# Patient Record
Sex: Female | Born: 1960 | Race: Black or African American | Hispanic: No | Marital: Married | Smoking: Never smoker
Health system: Southern US, Community
[De-identification: ages and names within clinical notes are randomized; demographics above are authoritative.]

## PROBLEM LIST (undated history)

## (undated) DIAGNOSIS — I10 Essential (primary) hypertension: Secondary | ICD-10-CM

## (undated) DIAGNOSIS — D696 Thrombocytopenia, unspecified: Secondary | ICD-10-CM

## (undated) DIAGNOSIS — M7730 Calcaneal spur, unspecified foot: Secondary | ICD-10-CM

## (undated) DIAGNOSIS — E079 Disorder of thyroid, unspecified: Secondary | ICD-10-CM

## (undated) DIAGNOSIS — E785 Hyperlipidemia, unspecified: Secondary | ICD-10-CM

## (undated) DIAGNOSIS — M1990 Unspecified osteoarthritis, unspecified site: Secondary | ICD-10-CM

## (undated) DIAGNOSIS — C50919 Malignant neoplasm of unspecified site of unspecified female breast: Secondary | ICD-10-CM

## (undated) HISTORY — DX: Essential (primary) hypertension: I10

## (undated) HISTORY — PX: COLONOSCOPY: SHX174

## (undated) HISTORY — DX: Calcaneal spur, unspecified foot: M77.30

## (undated) HISTORY — DX: Disorder of thyroid, unspecified: E07.9

## (undated) HISTORY — DX: Malignant neoplasm of unspecified site of unspecified female breast: C50.919

## (undated) HISTORY — DX: Thrombocytopenia, unspecified: D69.6

## (undated) HISTORY — DX: Hyperlipidemia, unspecified: E78.5

## (undated) HISTORY — PX: ABDOMINAL HYSTERECTOMY: SHX81

## (undated) HISTORY — DX: Unspecified osteoarthritis, unspecified site: M19.90

---

## 1998-07-06 ENCOUNTER — Encounter (INDEPENDENT_AMBULATORY_CARE_PROVIDER_SITE_OTHER): Payer: Self-pay | Admitting: *Deleted

## 1998-08-01 ENCOUNTER — Encounter: Admission: RE | Admit: 1998-08-01 | Discharge: 1998-08-01 | Payer: Self-pay | Admitting: Family Medicine

## 1998-09-25 ENCOUNTER — Encounter: Admission: RE | Admit: 1998-09-25 | Discharge: 1998-09-25 | Payer: Self-pay | Admitting: Family Medicine

## 1999-01-17 ENCOUNTER — Encounter: Admission: RE | Admit: 1999-01-17 | Discharge: 1999-01-17 | Payer: Self-pay | Admitting: Family Medicine

## 1999-02-14 ENCOUNTER — Encounter: Admission: RE | Admit: 1999-02-14 | Discharge: 1999-02-14 | Payer: Self-pay | Admitting: Family Medicine

## 1999-11-23 ENCOUNTER — Encounter: Admission: RE | Admit: 1999-11-23 | Discharge: 1999-11-23 | Payer: Self-pay | Admitting: Family Medicine

## 2000-10-19 ENCOUNTER — Encounter: Payer: Self-pay | Admitting: Emergency Medicine

## 2000-10-19 ENCOUNTER — Emergency Department (HOSPITAL_COMMUNITY): Admission: EM | Admit: 2000-10-19 | Discharge: 2000-10-19 | Payer: Self-pay | Admitting: Emergency Medicine

## 2001-02-11 ENCOUNTER — Encounter: Payer: Self-pay | Admitting: Emergency Medicine

## 2001-02-11 ENCOUNTER — Emergency Department (HOSPITAL_COMMUNITY): Admission: EM | Admit: 2001-02-11 | Discharge: 2001-02-11 | Payer: Self-pay | Admitting: Emergency Medicine

## 2001-02-18 ENCOUNTER — Encounter: Payer: Self-pay | Admitting: Internal Medicine

## 2001-02-18 ENCOUNTER — Encounter: Admission: RE | Admit: 2001-02-18 | Discharge: 2001-02-18 | Payer: Self-pay | Admitting: Internal Medicine

## 2001-02-23 ENCOUNTER — Encounter: Payer: Self-pay | Admitting: Internal Medicine

## 2001-02-23 ENCOUNTER — Encounter: Admission: RE | Admit: 2001-02-23 | Discharge: 2001-02-23 | Payer: Self-pay | Admitting: Internal Medicine

## 2001-05-04 ENCOUNTER — Encounter: Payer: Self-pay | Admitting: Surgery

## 2001-05-04 ENCOUNTER — Ambulatory Visit (HOSPITAL_COMMUNITY): Admission: RE | Admit: 2001-05-04 | Discharge: 2001-05-04 | Payer: Self-pay | Admitting: Surgery

## 2001-06-30 ENCOUNTER — Encounter: Payer: Self-pay | Admitting: Surgery

## 2001-07-02 ENCOUNTER — Encounter (INDEPENDENT_AMBULATORY_CARE_PROVIDER_SITE_OTHER): Payer: Self-pay | Admitting: *Deleted

## 2001-07-02 ENCOUNTER — Ambulatory Visit (HOSPITAL_COMMUNITY): Admission: RE | Admit: 2001-07-02 | Discharge: 2001-07-03 | Payer: Self-pay | Admitting: Surgery

## 2001-07-22 ENCOUNTER — Emergency Department (HOSPITAL_COMMUNITY): Admission: EM | Admit: 2001-07-22 | Discharge: 2001-07-22 | Payer: Self-pay | Admitting: Emergency Medicine

## 2002-03-18 ENCOUNTER — Encounter: Admission: RE | Admit: 2002-03-18 | Discharge: 2002-03-18 | Payer: Self-pay | Admitting: Internal Medicine

## 2002-03-18 ENCOUNTER — Encounter: Payer: Self-pay | Admitting: Internal Medicine

## 2002-04-07 ENCOUNTER — Emergency Department (HOSPITAL_COMMUNITY): Admission: EM | Admit: 2002-04-07 | Discharge: 2002-04-07 | Payer: Self-pay | Admitting: Emergency Medicine

## 2002-04-07 ENCOUNTER — Encounter: Payer: Self-pay | Admitting: Emergency Medicine

## 2002-12-01 ENCOUNTER — Emergency Department (HOSPITAL_COMMUNITY): Admission: EM | Admit: 2002-12-01 | Discharge: 2002-12-01 | Payer: Self-pay | Admitting: Emergency Medicine

## 2003-03-31 ENCOUNTER — Encounter: Admission: RE | Admit: 2003-03-31 | Discharge: 2003-03-31 | Payer: Self-pay | Admitting: Internal Medicine

## 2003-07-27 ENCOUNTER — Emergency Department (HOSPITAL_COMMUNITY): Admission: EM | Admit: 2003-07-27 | Discharge: 2003-07-27 | Payer: Self-pay | Admitting: Emergency Medicine

## 2003-10-18 ENCOUNTER — Emergency Department (HOSPITAL_COMMUNITY): Admission: EM | Admit: 2003-10-18 | Discharge: 2003-10-18 | Payer: Self-pay | Admitting: Emergency Medicine

## 2004-01-21 ENCOUNTER — Emergency Department (HOSPITAL_COMMUNITY): Admission: EM | Admit: 2004-01-21 | Discharge: 2004-01-21 | Payer: Self-pay | Admitting: Family Medicine

## 2004-01-31 ENCOUNTER — Emergency Department (HOSPITAL_COMMUNITY): Admission: EM | Admit: 2004-01-31 | Discharge: 2004-01-31 | Payer: Self-pay | Admitting: Emergency Medicine

## 2004-09-14 ENCOUNTER — Emergency Department (HOSPITAL_COMMUNITY): Admission: EM | Admit: 2004-09-14 | Discharge: 2004-09-14 | Payer: Self-pay | Admitting: Family Medicine

## 2004-09-18 ENCOUNTER — Emergency Department (HOSPITAL_COMMUNITY): Admission: EM | Admit: 2004-09-18 | Discharge: 2004-09-18 | Payer: Self-pay | Admitting: Family Medicine

## 2005-10-05 ENCOUNTER — Emergency Department (HOSPITAL_COMMUNITY): Admission: EM | Admit: 2005-10-05 | Discharge: 2005-10-05 | Payer: Self-pay | Admitting: Emergency Medicine

## 2006-04-04 ENCOUNTER — Encounter (INDEPENDENT_AMBULATORY_CARE_PROVIDER_SITE_OTHER): Payer: Self-pay | Admitting: *Deleted

## 2006-04-07 ENCOUNTER — Encounter: Admission: RE | Admit: 2006-04-07 | Discharge: 2006-04-07 | Payer: Self-pay | Admitting: Family Medicine

## 2006-05-25 ENCOUNTER — Emergency Department (HOSPITAL_COMMUNITY): Admission: EM | Admit: 2006-05-25 | Discharge: 2006-05-25 | Payer: Self-pay | Admitting: Family Medicine

## 2006-06-29 ENCOUNTER — Emergency Department (HOSPITAL_COMMUNITY): Admission: EM | Admit: 2006-06-29 | Discharge: 2006-06-29 | Payer: Self-pay | Admitting: Family Medicine

## 2006-12-06 ENCOUNTER — Emergency Department (HOSPITAL_COMMUNITY): Admission: EM | Admit: 2006-12-06 | Discharge: 2006-12-06 | Payer: Self-pay | Admitting: Emergency Medicine

## 2007-02-09 ENCOUNTER — Emergency Department (HOSPITAL_COMMUNITY): Admission: EM | Admit: 2007-02-09 | Discharge: 2007-02-09 | Payer: Self-pay | Admitting: Family Medicine

## 2007-05-26 ENCOUNTER — Ambulatory Visit (HOSPITAL_COMMUNITY): Admission: RE | Admit: 2007-05-26 | Discharge: 2007-05-26 | Payer: Self-pay | Admitting: Family Medicine

## 2007-06-04 ENCOUNTER — Encounter: Admission: RE | Admit: 2007-06-04 | Discharge: 2007-06-04 | Payer: Self-pay | Admitting: Family Medicine

## 2007-07-06 ENCOUNTER — Encounter: Admission: RE | Admit: 2007-07-06 | Discharge: 2007-07-06 | Payer: Self-pay | Admitting: Family Medicine

## 2007-09-17 ENCOUNTER — Ambulatory Visit: Payer: Self-pay | Admitting: Oncology

## 2007-09-29 LAB — CBC WITH DIFFERENTIAL/PLATELET
Eosinophils Absolute: 0.1 10*3/uL (ref 0.0–0.5)
HCT: 40.3 % (ref 34.8–46.6)
HGB: 13.3 g/dL (ref 11.6–15.9)
LYMPH%: 30.9 % (ref 14.0–48.0)
MONO#: 0.6 10*3/uL (ref 0.1–0.9)
NEUT#: 5.1 10*3/uL (ref 1.5–6.5)
NEUT%: 60.2 % (ref 39.6–76.8)
Platelets: 120 10*3/uL — ABNORMAL LOW (ref 145–400)
WBC: 8.5 10*3/uL (ref 3.9–10.0)

## 2007-09-29 LAB — COMPREHENSIVE METABOLIC PANEL
ALT: 18 U/L (ref 0–35)
AST: 16 U/L (ref 0–37)
CO2: 24 mEq/L (ref 19–32)
Chloride: 105 mEq/L (ref 96–112)
Sodium: 140 mEq/L (ref 135–145)
Total Bilirubin: 0.4 mg/dL (ref 0.3–1.2)
Total Protein: 7.3 g/dL (ref 6.0–8.3)

## 2007-09-29 LAB — LACTATE DEHYDROGENASE: LDH: 130 U/L (ref 94–250)

## 2007-09-29 LAB — CHCC SMEAR

## 2008-04-11 ENCOUNTER — Encounter: Admission: RE | Admit: 2008-04-11 | Discharge: 2008-04-11 | Payer: Self-pay | Admitting: Family Medicine

## 2008-04-27 ENCOUNTER — Ambulatory Visit: Payer: Self-pay | Admitting: Oncology

## 2008-04-29 LAB — CBC WITH DIFFERENTIAL/PLATELET
Basophils Absolute: 0.1 10*3/uL (ref 0.0–0.1)
HCT: 42 % (ref 34.8–46.6)
HGB: 13.5 g/dL (ref 11.6–15.9)
MCH: 24.5 pg — ABNORMAL LOW (ref 25.1–34.0)
MONO#: 0.6 10*3/uL (ref 0.1–0.9)
NEUT%: 46.2 % (ref 38.4–76.8)
WBC: 8.5 10*3/uL (ref 3.9–10.3)
lymph#: 3.7 10*3/uL — ABNORMAL HIGH (ref 0.9–3.3)

## 2008-05-26 ENCOUNTER — Encounter: Admission: RE | Admit: 2008-05-26 | Discharge: 2008-05-26 | Payer: Self-pay | Admitting: Family Medicine

## 2008-09-01 DIAGNOSIS — E559 Vitamin D deficiency, unspecified: Secondary | ICD-10-CM | POA: Insufficient documentation

## 2008-09-01 DIAGNOSIS — E785 Hyperlipidemia, unspecified: Secondary | ICD-10-CM | POA: Insufficient documentation

## 2008-09-26 ENCOUNTER — Ambulatory Visit: Payer: Self-pay | Admitting: Oncology

## 2009-01-08 ENCOUNTER — Emergency Department (HOSPITAL_COMMUNITY): Admission: EM | Admit: 2009-01-08 | Discharge: 2009-01-08 | Payer: Self-pay | Admitting: Emergency Medicine

## 2009-01-31 DIAGNOSIS — R5381 Other malaise: Secondary | ICD-10-CM | POA: Insufficient documentation

## 2009-01-31 DIAGNOSIS — R7301 Impaired fasting glucose: Secondary | ICD-10-CM | POA: Insufficient documentation

## 2009-02-03 DIAGNOSIS — I10 Essential (primary) hypertension: Secondary | ICD-10-CM | POA: Insufficient documentation

## 2009-06-02 ENCOUNTER — Encounter: Admission: RE | Admit: 2009-06-02 | Discharge: 2009-06-02 | Payer: Self-pay | Admitting: Family Medicine

## 2009-06-04 HISTORY — PX: BREAST BIOPSY: SHX20

## 2009-06-07 ENCOUNTER — Encounter: Admission: RE | Admit: 2009-06-07 | Discharge: 2009-06-07 | Payer: Self-pay | Admitting: Family Medicine

## 2009-06-15 ENCOUNTER — Encounter: Admission: RE | Admit: 2009-06-15 | Discharge: 2009-06-15 | Payer: Self-pay | Admitting: Family Medicine

## 2009-06-22 ENCOUNTER — Ambulatory Visit: Payer: Self-pay | Admitting: Oncology

## 2009-06-22 ENCOUNTER — Ambulatory Visit: Payer: Self-pay | Admitting: Genetic Counselor

## 2009-06-22 ENCOUNTER — Ambulatory Visit: Payer: Self-pay | Admitting: Family Medicine

## 2009-06-22 DIAGNOSIS — D693 Immune thrombocytopenic purpura: Secondary | ICD-10-CM | POA: Insufficient documentation

## 2009-06-22 DIAGNOSIS — I10 Essential (primary) hypertension: Secondary | ICD-10-CM | POA: Insufficient documentation

## 2009-06-22 DIAGNOSIS — Z862 Personal history of diseases of the blood and blood-forming organs and certain disorders involving the immune mechanism: Secondary | ICD-10-CM | POA: Insufficient documentation

## 2009-06-22 DIAGNOSIS — Z8639 Personal history of other endocrine, nutritional and metabolic disease: Secondary | ICD-10-CM

## 2009-06-26 ENCOUNTER — Encounter: Payer: Self-pay | Admitting: Family Medicine

## 2009-06-26 LAB — CBC WITH DIFFERENTIAL/PLATELET
Basophils Absolute: 0 10*3/uL (ref 0.0–0.1)
Eosinophils Absolute: 0 10*3/uL (ref 0.0–0.5)
HCT: 42.1 % (ref 34.8–46.6)
LYMPH%: 38.5 % (ref 14.0–49.7)
MCV: 77.1 fL — ABNORMAL LOW (ref 79.5–101.0)
MONO#: 0.7 10*3/uL (ref 0.1–0.9)
MONO%: 7.1 % (ref 0.0–14.0)
NEUT#: 5 10*3/uL (ref 1.5–6.5)
NEUT%: 53.7 % (ref 38.4–76.8)
Platelets: 119 10*3/uL — ABNORMAL LOW (ref 145–400)
WBC: 9.4 10*3/uL (ref 3.9–10.3)

## 2009-06-26 LAB — COMPREHENSIVE METABOLIC PANEL
ALT: 24 U/L (ref 0–35)
BUN: 10 mg/dL (ref 6–23)
CO2: 28 mEq/L (ref 19–32)
Calcium: 10.1 mg/dL (ref 8.4–10.5)
Chloride: 103 mEq/L (ref 96–112)
Creatinine, Ser: 0.92 mg/dL (ref 0.40–1.20)
Glucose, Bld: 98 mg/dL (ref 70–99)

## 2009-06-26 LAB — CANCER ANTIGEN 27.29: CA 27.29: 25 U/mL (ref 0–39)

## 2009-06-30 ENCOUNTER — Ambulatory Visit (HOSPITAL_COMMUNITY): Admission: RE | Admit: 2009-06-30 | Discharge: 2009-06-30 | Payer: Self-pay | Admitting: Oncology

## 2009-07-17 LAB — CBC WITH DIFFERENTIAL/PLATELET
Basophils Absolute: 0 10*3/uL (ref 0.0–0.1)
Eosinophils Absolute: 0.1 10*3/uL (ref 0.0–0.5)
HCT: 40 % (ref 34.8–46.6)
HGB: 12.9 g/dL (ref 11.6–15.9)
MCH: 25 pg — ABNORMAL LOW (ref 25.1–34.0)
MCV: 77.4 fL — ABNORMAL LOW (ref 79.5–101.0)
NEUT#: 4.3 10*3/uL (ref 1.5–6.5)
NEUT%: 55.2 % (ref 38.4–76.8)
RDW: 15 % — ABNORMAL HIGH (ref 11.2–14.5)
lymph#: 2.9 10*3/uL (ref 0.9–3.3)

## 2009-07-21 ENCOUNTER — Ambulatory Visit (HOSPITAL_COMMUNITY): Admission: RE | Admit: 2009-07-21 | Discharge: 2009-07-23 | Payer: Self-pay | Admitting: General Surgery

## 2009-07-21 ENCOUNTER — Encounter (INDEPENDENT_AMBULATORY_CARE_PROVIDER_SITE_OTHER): Payer: Self-pay | Admitting: General Surgery

## 2009-07-21 HISTORY — PX: MASTECTOMY: SHX3

## 2009-08-24 ENCOUNTER — Ambulatory Visit: Payer: Self-pay | Admitting: Oncology

## 2009-08-28 ENCOUNTER — Encounter: Payer: Self-pay | Admitting: Family Medicine

## 2009-08-28 LAB — BASIC METABOLIC PANEL
CO2: 24 mEq/L (ref 19–32)
Calcium: 9.6 mg/dL (ref 8.4–10.5)
Chloride: 103 mEq/L (ref 96–112)
Creatinine, Ser: 0.83 mg/dL (ref 0.40–1.20)
Sodium: 139 mEq/L (ref 135–145)

## 2009-08-28 LAB — CBC WITH DIFFERENTIAL/PLATELET
BASO%: 0.4 % (ref 0.0–2.0)
Eosinophils Absolute: 0.2 10*3/uL (ref 0.0–0.5)
LYMPH%: 37.1 % (ref 14.0–49.7)
MCHC: 32.2 g/dL (ref 31.5–36.0)
MONO#: 0.6 10*3/uL (ref 0.1–0.9)
NEUT#: 4.6 10*3/uL (ref 1.5–6.5)
Platelets: 109 10*3/uL — ABNORMAL LOW (ref 145–400)
RBC: 5.2 10*6/uL (ref 3.70–5.45)
RDW: 14.6 % — ABNORMAL HIGH (ref 11.2–14.5)
WBC: 8.7 10*3/uL (ref 3.9–10.3)

## 2009-08-28 LAB — TECHNOLOGIST REVIEW

## 2009-09-08 ENCOUNTER — Encounter: Payer: Self-pay | Admitting: Family Medicine

## 2009-09-18 ENCOUNTER — Encounter: Payer: Self-pay | Admitting: Family Medicine

## 2009-09-18 LAB — COMPREHENSIVE METABOLIC PANEL
Albumin: 4.2 g/dL (ref 3.5–5.2)
Alkaline Phosphatase: 78 U/L (ref 39–117)
BUN: 11 mg/dL (ref 6–23)
CO2: 25 mEq/L (ref 19–32)
Calcium: 9.8 mg/dL (ref 8.4–10.5)
Glucose, Bld: 106 mg/dL — ABNORMAL HIGH (ref 70–99)
Potassium: 3.9 mEq/L (ref 3.5–5.3)
Sodium: 139 mEq/L (ref 135–145)
Total Protein: 7.6 g/dL (ref 6.0–8.3)

## 2009-09-18 LAB — CBC WITH DIFFERENTIAL/PLATELET
Basophils Absolute: 0 10*3/uL (ref 0.0–0.1)
EOS%: 1.7 % (ref 0.0–7.0)
Eosinophils Absolute: 0.1 10*3/uL (ref 0.0–0.5)
HCT: 41.4 % (ref 34.8–46.6)
HGB: 13.2 g/dL (ref 11.6–15.9)
MCH: 24.6 pg — ABNORMAL LOW (ref 25.1–34.0)
MCV: 77.1 fL — ABNORMAL LOW (ref 79.5–101.0)
NEUT#: 4.3 10*3/uL (ref 1.5–6.5)
NEUT%: 51.7 % (ref 38.4–76.8)
RDW: 14.8 % — ABNORMAL HIGH (ref 11.2–14.5)
lymph#: 3.4 10*3/uL — ABNORMAL HIGH (ref 0.9–3.3)

## 2009-09-21 ENCOUNTER — Ambulatory Visit (HOSPITAL_COMMUNITY): Admission: RE | Admit: 2009-09-21 | Discharge: 2009-09-21 | Payer: Self-pay | Admitting: Oncology

## 2009-10-03 ENCOUNTER — Encounter: Payer: Self-pay | Admitting: *Deleted

## 2009-10-04 ENCOUNTER — Ambulatory Visit: Payer: Self-pay | Admitting: Family Medicine

## 2009-10-04 ENCOUNTER — Ambulatory Visit (HOSPITAL_COMMUNITY): Admission: RE | Admit: 2009-10-04 | Discharge: 2009-10-04 | Payer: Self-pay | Admitting: General Surgery

## 2009-10-04 DIAGNOSIS — C50111 Malignant neoplasm of central portion of right female breast: Secondary | ICD-10-CM | POA: Insufficient documentation

## 2009-10-06 ENCOUNTER — Ambulatory Visit: Payer: Self-pay | Admitting: Dentistry

## 2009-10-06 ENCOUNTER — Encounter: Admission: AD | Admit: 2009-10-06 | Discharge: 2009-10-06 | Payer: Self-pay | Admitting: Dentistry

## 2009-10-19 ENCOUNTER — Ambulatory Visit: Payer: Self-pay | Admitting: Oncology

## 2009-10-23 ENCOUNTER — Encounter: Payer: Self-pay | Admitting: Family Medicine

## 2009-10-30 ENCOUNTER — Encounter: Payer: Self-pay | Admitting: Family Medicine

## 2009-10-30 LAB — BASIC METABOLIC PANEL
CO2: 28 mEq/L (ref 19–32)
Calcium: 10.2 mg/dL (ref 8.4–10.5)
Chloride: 95 mEq/L — ABNORMAL LOW (ref 96–112)
Sodium: 137 mEq/L (ref 135–145)

## 2009-10-30 LAB — CBC WITH DIFFERENTIAL/PLATELET
BASO%: 0.5 % (ref 0.0–2.0)
Eosinophils Absolute: 0 10*3/uL (ref 0.0–0.5)
MCHC: 32.2 g/dL (ref 31.5–36.0)
MONO#: 0.9 10*3/uL (ref 0.1–0.9)
NEUT#: 4.1 10*3/uL (ref 1.5–6.5)
RBC: 5.59 10*6/uL — ABNORMAL HIGH (ref 3.70–5.45)
RDW: 14.8 % — ABNORMAL HIGH (ref 11.2–14.5)
WBC: 8.1 10*3/uL (ref 3.9–10.3)
lymph#: 3.1 10*3/uL (ref 0.9–3.3)
nRBC: 1 % — ABNORMAL HIGH (ref 0–0)

## 2009-11-13 LAB — CBC WITH DIFFERENTIAL/PLATELET
Basophils Absolute: 0 10*3/uL (ref 0.0–0.1)
Eosinophils Absolute: 0 10*3/uL (ref 0.0–0.5)
HCT: 37.8 % (ref 34.8–46.6)
HGB: 12.2 g/dL (ref 11.6–15.9)
MCV: 75 fL — ABNORMAL LOW (ref 79.5–101.0)
NEUT#: 13.9 10*3/uL — ABNORMAL HIGH (ref 1.5–6.5)
NEUT%: 85.8 % — ABNORMAL HIGH (ref 38.4–76.8)
RDW: 16.4 % — ABNORMAL HIGH (ref 11.2–14.5)
lymph#: 1.7 10*3/uL (ref 0.9–3.3)

## 2009-11-13 LAB — COMPREHENSIVE METABOLIC PANEL
Albumin: 4.2 g/dL (ref 3.5–5.2)
Alkaline Phosphatase: 56 U/L (ref 39–117)
BUN: 17 mg/dL (ref 6–23)
Calcium: 9.9 mg/dL (ref 8.4–10.5)
Creatinine, Ser: 0.77 mg/dL (ref 0.40–1.20)
Glucose, Bld: 163 mg/dL — ABNORMAL HIGH (ref 70–99)
Potassium: 3.6 mEq/L (ref 3.5–5.3)

## 2009-11-30 ENCOUNTER — Ambulatory Visit: Payer: Self-pay | Admitting: Oncology

## 2009-12-04 LAB — COMPREHENSIVE METABOLIC PANEL
ALT: 23 U/L (ref 0–35)
CO2: 23 mEq/L (ref 19–32)
Chloride: 103 mEq/L (ref 96–112)
Sodium: 138 mEq/L (ref 135–145)
Total Bilirubin: 0.3 mg/dL (ref 0.3–1.2)
Total Protein: 6.4 g/dL (ref 6.0–8.3)

## 2009-12-04 LAB — CBC WITH DIFFERENTIAL/PLATELET
Basophils Absolute: 0 10*3/uL (ref 0.0–0.1)
Eosinophils Absolute: 0 10*3/uL (ref 0.0–0.5)
HCT: 34.8 % (ref 34.8–46.6)
HGB: 11.1 g/dL — ABNORMAL LOW (ref 11.6–15.9)
LYMPH%: 9.6 % — ABNORMAL LOW (ref 14.0–49.7)
MONO#: 0.5 10*3/uL (ref 0.1–0.9)
NEUT#: 12.3 10*3/uL — ABNORMAL HIGH (ref 1.5–6.5)
NEUT%: 86.7 % — ABNORMAL HIGH (ref 38.4–76.8)
Platelets: 157 10*3/uL (ref 145–400)
WBC: 14.2 10*3/uL — ABNORMAL HIGH (ref 3.9–10.3)
nRBC: 0 % (ref 0–0)

## 2009-12-11 LAB — CBC WITH DIFFERENTIAL/PLATELET
BASO%: 1.2 % (ref 0.0–2.0)
Basophils Absolute: 0.1 10*3/uL (ref 0.0–0.1)
EOS%: 0.3 % (ref 0.0–7.0)
HGB: 11.5 g/dL — ABNORMAL LOW (ref 11.6–15.9)
MCH: 24.5 pg — ABNORMAL LOW (ref 25.1–34.0)
MONO#: 2.1 10*3/uL — ABNORMAL HIGH (ref 0.1–0.9)
RDW: 18.7 % — ABNORMAL HIGH (ref 11.2–14.5)
WBC: 12.2 10*3/uL — ABNORMAL HIGH (ref 3.9–10.3)
lymph#: 3 10*3/uL (ref 0.9–3.3)

## 2009-12-11 LAB — BASIC METABOLIC PANEL
Glucose, Bld: 118 mg/dL — ABNORMAL HIGH (ref 70–99)
Potassium: 3.3 mEq/L — ABNORMAL LOW (ref 3.5–5.3)
Sodium: 139 mEq/L (ref 135–145)

## 2009-12-25 LAB — CBC WITH DIFFERENTIAL/PLATELET
Eosinophils Absolute: 0 10*3/uL (ref 0.0–0.5)
MONO#: 0.9 10*3/uL (ref 0.1–0.9)
NEUT#: 11.7 10*3/uL — ABNORMAL HIGH (ref 1.5–6.5)
RBC: 4.31 10*6/uL (ref 3.70–5.45)
RDW: 20.2 % — ABNORMAL HIGH (ref 11.2–14.5)
WBC: 13.9 10*3/uL — ABNORMAL HIGH (ref 3.9–10.3)
nRBC: 0 % (ref 0–0)

## 2009-12-26 LAB — COMPREHENSIVE METABOLIC PANEL
Albumin: 4 g/dL (ref 3.5–5.2)
CO2: 25 mEq/L (ref 19–32)
Glucose, Bld: 150 mg/dL — ABNORMAL HIGH (ref 70–99)
Potassium: 3.9 mEq/L (ref 3.5–5.3)
Sodium: 139 mEq/L (ref 135–145)
Total Protein: 6.3 g/dL (ref 6.0–8.3)

## 2010-01-01 ENCOUNTER — Ambulatory Visit: Payer: Self-pay | Admitting: Oncology

## 2010-01-02 ENCOUNTER — Ambulatory Visit: Payer: Self-pay | Admitting: Family Medicine

## 2010-01-02 DIAGNOSIS — R358 Other polyuria: Secondary | ICD-10-CM

## 2010-01-02 DIAGNOSIS — R3589 Other polyuria: Secondary | ICD-10-CM | POA: Insufficient documentation

## 2010-01-03 LAB — CBC WITH DIFFERENTIAL/PLATELET
BASO%: 0.9 % (ref 0.0–2.0)
LYMPH%: 11.1 % — ABNORMAL LOW (ref 14.0–49.7)
MCHC: 33.3 g/dL (ref 31.5–36.0)
MONO#: 1.8 10*3/uL — ABNORMAL HIGH (ref 0.1–0.9)
Platelets: 153 10*3/uL (ref 145–400)
RBC: 4.29 10*6/uL (ref 3.70–5.45)
WBC: 20.3 10*3/uL — ABNORMAL HIGH (ref 3.9–10.3)
lymph#: 2.3 10*3/uL (ref 0.9–3.3)

## 2010-01-03 LAB — BASIC METABOLIC PANEL
CO2: 29 mEq/L (ref 19–32)
Calcium: 9.1 mg/dL (ref 8.4–10.5)
Sodium: 141 mEq/L (ref 135–145)

## 2010-01-15 ENCOUNTER — Telehealth: Payer: Self-pay | Admitting: Family Medicine

## 2010-01-31 ENCOUNTER — Ambulatory Visit
Admission: RE | Admit: 2010-01-31 | Discharge: 2010-01-31 | Payer: Self-pay | Source: Home / Self Care | Attending: Family Medicine | Admitting: Family Medicine

## 2010-01-31 ENCOUNTER — Encounter: Payer: Self-pay | Admitting: Family Medicine

## 2010-01-31 LAB — CONVERTED CEMR LAB
Alkaline Phosphatase: 49 units/L (ref 39–117)
Cholesterol: 246 mg/dL — ABNORMAL HIGH (ref 0–200)
Creatinine, Ser: 0.67 mg/dL (ref 0.40–1.20)
Glucose, Bld: 102 mg/dL — ABNORMAL HIGH (ref 70–99)
HCT: 37.4 % (ref 36.0–46.0)
Hemoglobin: 11.7 g/dL — ABNORMAL LOW (ref 12.0–15.0)
LDL Cholesterol: 152 mg/dL — ABNORMAL HIGH (ref 0–99)
MCHC: 31.3 g/dL (ref 30.0–36.0)
MCV: 82.6 fL (ref 78.0–100.0)
RBC: 4.53 M/uL (ref 3.87–5.11)
Sodium: 143 meq/L (ref 135–145)
TSH: 0.856 microintl units/mL (ref 0.350–4.500)
Total Bilirubin: 0.5 mg/dL (ref 0.3–1.2)
Total Protein: 6.1 g/dL (ref 6.0–8.3)
VLDL: 45 mg/dL — ABNORMAL HIGH (ref 0–40)

## 2010-02-04 HISTORY — PX: PARATHYROIDECTOMY: SHX19

## 2010-02-06 ENCOUNTER — Encounter: Payer: Self-pay | Admitting: Family Medicine

## 2010-02-07 ENCOUNTER — Encounter: Payer: Self-pay | Admitting: Family Medicine

## 2010-02-14 ENCOUNTER — Ambulatory Visit: Admit: 2010-02-14 | Payer: Self-pay

## 2010-02-22 ENCOUNTER — Ambulatory Visit: Payer: Self-pay | Admitting: Oncology

## 2010-02-25 ENCOUNTER — Encounter: Payer: Self-pay | Admitting: Family Medicine

## 2010-02-26 ENCOUNTER — Encounter: Payer: Self-pay | Admitting: Family Medicine

## 2010-02-26 LAB — COMPREHENSIVE METABOLIC PANEL
ALT: 12 U/L (ref 0–35)
AST: 15 U/L (ref 0–37)
Albumin: 4 g/dL (ref 3.5–5.2)
Alkaline Phosphatase: 57 U/L (ref 39–117)
BUN: 9 mg/dL (ref 6–23)
Calcium: 10 mg/dL (ref 8.4–10.5)
Chloride: 100 mEq/L (ref 96–112)
Potassium: 3.4 mEq/L — ABNORMAL LOW (ref 3.5–5.3)
Sodium: 143 mEq/L (ref 135–145)

## 2010-02-26 LAB — CBC WITH DIFFERENTIAL/PLATELET
BASO%: 0.5 % (ref 0.0–2.0)
EOS%: 1.1 % (ref 0.0–7.0)
MCH: 24.7 pg — ABNORMAL LOW (ref 25.1–34.0)
MCHC: 31.7 g/dL (ref 31.5–36.0)
MONO#: 0.4 10*3/uL (ref 0.1–0.9)
NEUT%: 54.7 % (ref 38.4–76.8)
RBC: 5.03 10*6/uL (ref 3.70–5.45)
RDW: 14.6 % — ABNORMAL HIGH (ref 11.2–14.5)
WBC: 5.7 10*3/uL (ref 3.9–10.3)
lymph#: 2.1 10*3/uL (ref 0.9–3.3)
nRBC: 0 % (ref 0–0)

## 2010-03-06 NOTE — Assessment & Plan Note (Signed)
Summary: BP CHECK/EO  Nurse Visit  patient in office , originally had appointment scheduled with Dr. Hulen Luster for CPE. however she was notified by her surgeon's office  that they need to see  her today to check out her port-a- cath for her upcoming chemotherapy on 10/23/2009. appointment with surgeon is 2:00 today and her appointment with Dr. Hulen Luster was at 1:45 . She does not time today to see Dr. Hulen Luster and so her appointment here was cancelled and she wanted to have BP checked only.  BP checked manually LA 170/100, RA 160/100  pulse 100.  after BP was checked patient stated she had had a right mastectomy in June 2011.  she did take her medication as directed this AM. consulted Dr. Jennette Kettle and she advises to reschedule appointment 'with Dr. Hulen Luster and follow up on BP then. Dr. Jeri Lager first available appointment is 10/24/2009. patient states she is to recieve  chemo on 10/23/2009 and is suppose to have BP under control by that time.  scheduled appointment for work in tomorrow with Dr. Lelon Perla for BP management and appointment scheduled with Dr. Hulen Luster on 10/30/2009 for CPE. Theresia Lo RN  October 03, 2009 2:02 PM    Allergies: No Known Drug Allergies  Orders Added: 1)  No Charge Patient Arrived (NCPA0) [[id number]]

## 2010-03-06 NOTE — Consult Note (Signed)
[facility]   Imported By: Clydell Hakim 11/01/2009 10:31:52  _____________________________________________________________________  External Attachment:    Type:   Image     Comment:   External Document

## 2010-03-06 NOTE — Assessment & Plan Note (Signed)
Summary: BP follow up/ls   Vital Signs:  Patient profile:   50 year old female Height:      65.5 inches Weight:      216 pounds BMI:     35.53 BSA:     2.06 Temp:     98.6 degrees F Pulse rate:   86 / minute BP sitting:   163 / 97  Vitals Entered By: Jone Baseman CMA (October 04, 2009 3:21 PM) CC: F/U BP Is Patient Diabetic? No Pain Assessment Patient in pain? no        CC:  F/U BP.  History of Present Illness: 1. HTN - Pt has had this for months but is really trying to get it under control now because she is about to undergo chemotherapy and her Heme/Onc doctor wants her blood pressure better controlled - She is taking the HCTZ as directed - She checks her blood pressure at home occassionally and it is usually around 160/100.  ROS: endorses some dizziness.  Denies chest pain, shortness of breath.  PMHx: breast cancer s/p right mastectomy  Habits & Providers  Alcohol-Tobacco-Diet     Tobacco Status: never  Current Medications (verified): 1)  Hydrochlorothiazide 25 Mg Tabs (Hydrochlorothiazide) .Marland Kitchen.. 1 Tab By Mouth Daily 2)  Carvedilol 6.25 Mg Tabs (Carvedilol) .Marland Kitchen.. 1 Tab By Mouth Twice A Day  Allergies: No Known Drug Allergies  Past History:  Past Medical History: Reviewed history from 09/08/2009 and no changes required. 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 ; No smoking, alcohol, drugs  Review of Systems  The patient denies fever, weight loss, dyspnea on exertion, and prolonged cough.    Physical Exam  General:  Vitals reviewed and rechecked.  Obese.  No acute distress. Eyes:  vision grossly intact.   Neck:  supple, full ROM, and no masses.   Breasts:  right mastectomy.  port in place. Lungs:  Normal respiratory effort, chest expands symmetrically. Lungs are clear to  auscultation, no crackles or wheezes. Heart:  Normal rate and regular rhythm. S1 and S2 normal without gallop, murmur, click, rub or other extra sounds. Extremities:  no lower extremity edema Psych:  not anxious appearing and not depressed appearing.     Impression & Recommendations:  Problem # 1:  ESSENTIAL HYPERTENSION (ICD-401.9) Assessment Unchanged  Not at goal.  Increased HCTZ.  Will avoid nephrotoxic medicines since she will be starting chemotherapy.  Will start BB. Her updated medication list for this problem includes:    Hydrochlorothiazide 25 Mg Tabs (Hydrochlorothiazide) .Marland Kitchen... 1 tab by mouth daily    Carvedilol 6.25 Mg Tabs (Carvedilol) .Marland Kitchen... 1 tab by mouth twice a day  Orders: [facility]- Est Level  3 (09811)  Complete Medication List: 1)  Hydrochlorothiazide 25 Mg Tabs (Hydrochlorothiazide) .Marland Kitchen.. 1 tab by mouth daily 2)  Carvedilol 6.25 Mg Tabs (Carvedilol) .Marland Kitchen.. 1 tab by mouth twice a day  Patient Instructions: 1)  We will make a couple changes for your blood pressure 2)  I am going to increase the dose of the HCTZ to 25mg  daily 3)  I am also going to add another medicine, Carvedilol, take this twice a day. 4)  Please schedule a follow up appointment in 4-6 weeks to check on your blood pressure Prescriptions: CARVEDILOL 6.25 MG TABS (CARVEDILOL) 1 tab by mouth twice a day  #60 x  3   Entered and Authorized by:   Angelena Sole MD   Signed by:   Angelena Sole MD on 10/04/2009   Method used:   Electronically to        [facility] (retail)       [address]       [place], [place]  [address]       Ph: [phone]       Fax: [phone]   RxID:   [id number] HYDROCHLOROTHIAZIDE 25 MG TABS (HYDROCHLOROTHIAZIDE) 1 tab by mouth daily  #30 x 3   Entered and Authorized by:   Angelena Sole MD   Signed by:   Angelena Sole MD on 10/04/2009   Method used:   Electronically to        [facility] (retail)       [address]       [place], [place]  [address]       Ph: [phone]       Fax: [phone]   RxID:   [id number]

## 2010-03-06 NOTE — Consult Note (Signed)
Summary: [facility]  [facility]   Imported By: Clydell Hakim 09/07/2009 14:12:50  _____________________________________________________________________  External Attachment:    Type:   Image     Comment:   External Document

## 2010-03-06 NOTE — Consult Note (Signed)
[facility]   Imported By: Clydell Hakim 11/21/2009 15:35:19  _____________________________________________________________________  External Attachment:    Type:   Image     Comment:   External Document

## 2010-03-06 NOTE — Consult Note (Signed)
Summary: [facility] mamillary ca, multifocal  [facility]   Imported By: Bradly Bienenstock 07/29/2009 13:19:24  _____________________________________________________________________  External Attachment:    Type:   Image     Comment:   External Document

## 2010-03-06 NOTE — Assessment & Plan Note (Signed)
Summary: f/u,df   Vital Signs:  Patient profile:   50 year old female Height:      65.5 inches Weight:      219.44 pounds BMI:     36.09 BSA:     2.07 Temp:     98.8 degrees F Pulse rate:   93 / minute BP sitting:   117 / 67  Vitals Entered By: Jone Baseman CMA (January 02, 2010 3:09 PM) CC: BP and ?diabetes Is Patient Diabetic? No Pain Assessment Patient in pain? no        Primary Care Provider:  [name] MD  CC:  BP and ?diabetes.  History of Present Illness: BP- oncologist concerned about elevated BPs at her visits.  BP usually 140-150/85-90.  pt admits anxiety at these appts.  no HA.  pt states that she has had some blurry vision for roughly 6 months.  does not wear corrective lenses.    ?diabetes-  oncologist concerned that she be evaluated for diabetes, has family hx of this.  endorses some polyuria, polydypsia.  denies burning with urination  Habits & Providers  Alcohol-Tobacco-Diet     Alcohol drinks/day: 0     Tobacco Status: never     Diet Comments: trying to limit salt, eats canned veggies but low salt, baked foods     Diet Counseling: try to switch to frozen veggies instead  Current Medications (verified): 1)  Hydrochlorothiazide 25 Mg Tabs (Hydrochlorothiazide) .Marland Kitchen.. 1 Tab By Mouth Daily 2)  Carvedilol 6.25 Mg Tabs (Carvedilol) .Marland Kitchen.. 1 Tab By Mouth Twice A Day  Allergies (verified): No Known Drug Allergies  Review of Systems  The patient denies anorexia and fever.    Physical Exam  General:  NAD vs reviewed Lungs:  Normal respiratory effort, chest expands symmetrically. Lungs are clear to auscultation, no crackles or wheezes. Heart:  Normal rate and regular rhythm. S1 and S2 normal without gallop, murmur, click, rub or other extra sounds.   Impression & Recommendations:  Problem # 1:  ESSENTIAL HYPERTENSION (ICD-401.9) Assessment Improved today BP  is low normal.  will check CMET, CBC, Lipids as previously ordered.  pt to RTC in 1-2  weeks to reeval BP.  ? if anxiety plays a strong role, also pt just finished chemo...possibly have been raising her BPs.  Her updated medication list for this problem includes:    Hydrochlorothiazide 25 Mg Tabs (Hydrochlorothiazide) .Marland Kitchen... 1 tab by mouth daily    Carvedilol 6.25 Mg Tabs (Carvedilol) .Marland Kitchen... 1 tab by mouth twice a day  Orders: [facility]- Est Level  3 (29562)  Problem # 2:  POLYURIA (ZHY-865.78) Assessment: New given blurry vision, increased urination reasonable to check labs as above.  CBG today was normal.  asked pt to call her opthomologist for an eye/vision exam. Orders: Glucose Cap-[facility] (46962) [facility]- Est Level  3 (95284)  Complete Medication List: 1)  Hydrochlorothiazide 25 Mg Tabs (Hydrochlorothiazide) .Marland Kitchen.. 1 tab by mouth daily 2)  Carvedilol 6.25 Mg Tabs (Carvedilol) .Marland Kitchen.. 1 tab by mouth twice a day   Orders Added: 1)  Glucose Cap-[facility] [82948] 2)  [facility]- Est Level  3 [13244]

## 2010-03-06 NOTE — Miscellaneous (Signed)
  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

## 2010-03-06 NOTE — Assessment & Plan Note (Signed)
Summary: NP,tcb   Vital Signs:  Patient profile:   50 year old female Height:      65.5 inches Weight:      217.6 pounds BMI:     35.79 Temp:     98.5 degrees F oral Pulse rate:   91 / minute BP sitting:   153 / 84  (left arm) Cuff size:   large  Vitals Entered By: Garen Grams LPN (Jun 22, 2009 1:31 PM) CC: New Patient Is Patient Diabetic? No Pain Assessment Patient in pain? no        CC:  New Patient.  History of Present Illness: new pt  feet swelling- pain in feet and swelling for several months.  uses OTC compression stockings which help.  also puts feet up at night.  unable to work due to pain in feet but reports exercising by walking 1 hour twice a week?  Blood pressure- HTN since 1996, currently on no meds.  has been on amlodipine and metoprolol in past.  She thinks the metop did not agree with her, made her dizzy.  Habits & Providers  Alcohol-Tobacco-Diet     Alcohol drinks/day: 0     Tobacco Status: never     Diet Comments: trying to limit salt, eats canned veggies but low salt, baked foods     Diet Counseling: try to switch to frozen veggies instead  Exercise-Depression-Behavior     Does Patient Exercise: yes     Exercise Counseling: to improve exercise regimen     Type of exercise: walking     Exercise (avg: min/session): 30-60     Times/week: <3     STD Risk: never     STD Risk Counseling: not indicated-no STD risk noted  Current Medications (verified): 1)  Hydrochlorothiazide 12.5 Mg Caps (Hydrochlorothiazide) .... 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 ; No smoking, alcohol, drugsSmoking Status:  never Does Patient Exercise:  yes STD Risk:  never  Review of Systems       The patient complains of  peripheral edema.  The patient denies fever, weight gain, chest pain, syncope, dyspnea on exertion, headaches, incontinence, difficulty walking, and depression.    Physical Exam  General:  Well-developed,well-nourished,in no acute distress; alert,appropriate and cooperative throughout examination Head:  Normocephalic and atraumatic without obvious abnormalities. No apparent alopecia or balding. Eyes:  sclera yellow/brown tint, pt states this is normal for her. Ears:  External ear exam shows no significant lesions or deformities.  Otoscopic examination reveals clear canals, tympanic membranes are intact bilaterally without bulging, retraction, inflammation or discharge. Hearing is grossly normal bilaterally. Nose:  External nasal examination shows no deformity or inflammation. Nasal mucosa are pink and moist without lesions or exudates. Mouth:  Oral mucosa and oropharynx without lesions or exudates.  Teeth in good repair. Neck:  No deformities, masses, or tenderness noted. small scar anteriorly Lungs:  Normal respiratory effort, chest expands symmetrically. Lungs are clear to auscultation, no crackles or wheezes. Heart:  Normal rate and regular rhythm. S1 and S2 normal without gallop, murmur, click, rub or other extra sounds. Abdomen:  Bowel sounds positive,abdomen soft and non-tender without masses, organomegaly or hernias noted. using powder under pannus Pulses:  R and L carotid,radial,femoral,dorsalis pedis and posterior tibial pulses are full and  equal bilaterally Extremities:  No clubbing, cyanosis, edema, or deformity noted with normal full range of motion of all joints.  wearing compression hose Neurologic:  No cranial nerve deficits noted. Station and gait are normal.Sensory, motor and coordinative functions appear intact. Skin:  Intact without suspicious lesions or rashes Psych:  Cognition and judgment appear intact. Alert and cooperative with normal attention span and concentration. No  apparent delusions, illusions, hallucinations   Impression & Recommendations:  Problem # 1:  PHYSICAL EXAMINATION (ICD-V70.0) Assessment New did not require pap or gyn exam today, had one within a year at last practice.  sees Dr. Darrold Span for ITP and uses to go to [facility] for PCP>  Will ask for release of info to get records at next visit.  Will check a CMET given HTN and starting HCTZ as well as hx of hyperparathyroidism.  CBC to look at platelets.  Problem # 2:  ESSENTIAL HYPERTENSION (ICD-401.9) Assessment: New did not like previous regimen and wants a drug for swelling .. swelling not bad today.  coudl use lasix but she thinks that she has tried that as well.  RTC in 2-3 weeks for BP check and blood work. Her updated medication list for this problem includes:    Hydrochlorothiazide 12.5 Mg Caps (Hydrochlorothiazide) .Marland Kitchen... Take one each am  Future Orders: Comp Met-[facility] ([id number]) ... 06/10/2010 Lipid-[facility] ([id number]) ... 06/14/2010 TSH-[facility] [id number]) ... 06/21/2010  Complete Medication List: 1)  Hydrochlorothiazide 12.5 Mg Caps (Hydrochlorothiazide) .... Take one each am  Other Orders: Future Orders: CBC-[facility] ([id number]) ... 06/28/2010  Patient Instructions: 1)  It was nice to meet you today 2)  I am going to start you on HCTZ.  I will send this to your pharmacy. 3)  Please come back in 2-3 weeks to see me to recheck blood pressure and see how your swelling is doing.   4)  Please schedule a lab visit before hand, maybe 2-3 days before so that we will have your bloodwork to see then.   5)  Also, look and see when your last tetanus shot was. Prescriptions: HYDROCHLOROTHIAZIDE 12.5 MG CAPS (HYDROCHLOROTHIAZIDE) take one each AM  #30 x 3   Entered and Authorized by:   [name] MD   Signed by:   [name] MD on 06/22/2009   Method used:   Electronically to        [facility] (retail)       [address]       [place], [place]  [address]       Ph: [phone]       Fax: [phone]   RxID:   [id number]

## 2010-03-06 NOTE — Consult Note (Signed)
[facility]   Imported By: De Nurse 10/06/2009 11:34:03  _____________________________________________________________________  External Attachment:    Type:   Image     Comment:   External Document

## 2010-03-08 NOTE — Progress Notes (Signed)
Summary: ROI  ROI   Imported By: De Nurse 02/15/2010 16:35:38  _____________________________________________________________________  External Attachment:    Type:   Image     Comment:   External Document

## 2010-03-08 NOTE — Progress Notes (Signed)
Summary: refills  Phone Note Refill Request   Refills Requested: Medication #1:  HYDROCHLOROTHIAZIDE 25 MG TABS 1 tab by mouth daily  Medication #2:  CARVEDILOL 6.25 MG TABS 1 tab by mouth twice a day. Initial call taken by: Abundio Miu,  January 15, 2010 8:48 AM  Follow-up for Phone Call        done.  pls let pt know Follow-up by: [name] MD,  January 15, 2010 9:53 AM    Prescriptions: CARVEDILOL 6.25 MG TABS (CARVEDILOL) 1 tab by mouth twice a day  #60 x 3   Entered and Authorized by:   [name] MD   Signed by:   [name] MD on 01/15/2010   Method used:   Electronically to        [facility] (retail)       [address]       [place], [place]  [address]       Ph: [phone]       Fax: [phone]   RxID:   [id number] HYDROCHLOROTHIAZIDE 25 MG TABS (HYDROCHLOROTHIAZIDE) 1 tab by mouth daily  #30 x 3   Entered and Authorized by:   [name] MD   Signed by:   [name] MD on 01/15/2010   Method used:   Electronically to        [facility] (retail)       [address]       [place], [place]  [address]       Ph: [phone]       Fax: [phone]   RxID:   [id number]   Appended Document: refills Patient informed

## 2010-03-08 NOTE — Letter (Signed)
Summary: Generic Letter  [facility]  [address]   [place], [place] [address]   Phone: [phone]  Fax: [phone]    02/07/2010  [facility] [address] [place], [place]  [address]  Dear Ms. Berndt,  I wanted to let you know that your lab work came back looking pretty good. There is no evidence that you have diabetes.  Your cholesterol was a little high.  WHen you come back for a blood pressure recheck, we can discuss if we will start a medication for your cholesterol.  I hope you are doing well.  Please call my office for an appt or with questions.         Sincerely,   [name] MD  Appended Document: Generic Letter mailed letter to pt

## 2010-03-14 NOTE — Consult Note (Signed)
[facility]: [facility]   Imported By: De Nurse 03/06/2010 14:43:47  _____________________________________________________________________  External Attachment:    Type:   Image     Comment:   External Document

## 2010-03-21 ENCOUNTER — Other Ambulatory Visit: Payer: Self-pay | Admitting: Plastic Surgery

## 2010-03-21 ENCOUNTER — Encounter: Payer: Self-pay | Admitting: *Deleted

## 2010-03-21 DIAGNOSIS — Z853 Personal history of malignant neoplasm of breast: Secondary | ICD-10-CM

## 2010-04-04 ENCOUNTER — Ambulatory Visit (INDEPENDENT_AMBULATORY_CARE_PROVIDER_SITE_OTHER): Payer: BC Managed Care – PPO | Admitting: Sports Medicine

## 2010-04-04 ENCOUNTER — Encounter: Payer: Self-pay | Admitting: Sports Medicine

## 2010-04-04 ENCOUNTER — Ambulatory Visit: Payer: Self-pay | Admitting: Family Medicine

## 2010-04-04 VITALS — BP 129/86 | HR 79 | Temp 97.4°F | Ht 63.0 in | Wt 215.0 lb

## 2010-04-04 DIAGNOSIS — M79609 Pain in unspecified limb: Secondary | ICD-10-CM

## 2010-04-04 DIAGNOSIS — G5600 Carpal tunnel syndrome, unspecified upper limb: Secondary | ICD-10-CM

## 2010-04-04 DIAGNOSIS — M79646 Pain in unspecified finger(s): Secondary | ICD-10-CM | POA: Insufficient documentation

## 2010-04-04 LAB — CONVERTED CEMR LAB: Rhuematoid fact SerPl-aCnc: <10 intl units/mL (ref <=14)

## 2010-04-04 MED ORDER — MELOXICAM 15 MG PO TABS: 15.0000 mg | ORAL_TABLET | Freq: Every day | ORAL | Status: DC

## 2010-04-04 MED ORDER — KETOROLAC TROMETHAMINE 30 MG/ML IJ SOLN
30.0000 mg | Freq: Once | INTRAMUSCULAR | Status: AC
  Administered 2010-04-04: 30 mg via INTRAMUSCULAR

## 2010-04-04 NOTE — Progress Notes (Signed)
  Subjective:    Patient ID: [name], female    DOB: [birth year], 50 y.o.   MRN: [id number]  HPI C/o not feeling well.  Specifically joint pain, stiffness particularly when still for some time and in the mornings.  Pain in ankles and hands.  Worse at basal joint of L thumb.  No numbness/tingling into fingers.  No neck pain or radiation.  No fevers/chills/wt loss/diarrhea.  Back ok, no pain.  No tick bites or camping trips.  No URI symptoms. No famHx RA.  She is R handed.   Review of Systems    See HPI Objective:   Physical Exam  Constitutional: She appears well-developed and well-nourished.  Musculoskeletal:       L Wrist: Inspection normal but with minimal increased swelling over volar 1st CMC joint when compared to R.  No thenar/hypothenar atrophy. ROM smooth and normal with good flexion and extension and ulnar/radial deviation that is symmetrical with opposite wrist. Palpation is normal over metacarpals, navicular, lunate, and TFCC; tendons without tenderness/ swelling She does have tenderness over CMC joint. No snuffbox tenderness. No tenderness over Canal of [place]. Strength 5/5 in all directions with some pain to thumb abduction. Negative Finkelstein, tinel's and phalens. Negative Watson's test.   Skin: Skin is warm and dry.          Assessment & Plan:

## 2010-04-04 NOTE — Patient Instructions (Signed)
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 Checking Rheumatoid factor levels with morning stiffness. XRay hand. Toradol in office. mobic at home. See me Friday to re-eval, ok  To double book. -Dr. Karie Schwalbe.

## 2010-04-04 NOTE — Assessment & Plan Note (Addendum)
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 Gelling suggestive of OA. Nothing in hx to suggest tick-borne illness, reiters, or Seroneg arthropathy. Checking RF with morning stiffness. XR hand. Toradol in office. mobic at home. RTC Friday to re-eval, consider cortisone injection if CMC DJD present.

## 2010-04-06 ENCOUNTER — Ambulatory Visit: Payer: BC Managed Care – PPO | Admitting: Sports Medicine

## 2010-04-11 ENCOUNTER — Ambulatory Visit
Admission: RE | Admit: 2010-04-11 | Discharge: 2010-04-11 | Disposition: A | Discharge status: Home / Self Care | Payer: BC Managed Care – PPO | Source: Ambulatory Visit | Attending: Plastic Surgery | Admitting: Plastic Surgery

## 2010-04-11 DIAGNOSIS — Z853 Personal history of malignant neoplasm of breast: Secondary | ICD-10-CM

## 2010-04-17 LAB — GLUCOSE, CAPILLARY: Glucose-Capillary: 101 mg/dL — ABNORMAL HIGH (ref 70–99)

## 2010-04-22 LAB — GLUCOSE, CAPILLARY
Glucose-Capillary: 113 mg/dL — ABNORMAL HIGH (ref 70–99)
Glucose-Capillary: 120 mg/dL — ABNORMAL HIGH (ref 70–99)
Glucose-Capillary: 124 mg/dL — ABNORMAL HIGH (ref 70–99)
Glucose-Capillary: 153 mg/dL — ABNORMAL HIGH (ref 70–99)
Glucose-Capillary: 164 mg/dL — ABNORMAL HIGH (ref 70–99)

## 2010-04-23 LAB — CBC
HCT: 42.1 % (ref 36.0–46.0)
Hemoglobin: 13.9 g/dL (ref 12.0–15.0)
MCV: 79.3 fL (ref 78.0–100.0)
RBC: 5.31 MIL/uL — ABNORMAL HIGH (ref 3.87–5.11)
WBC: 8.7 10*3/uL (ref 4.0–10.5)

## 2010-04-23 LAB — URINALYSIS, ROUTINE W REFLEX MICROSCOPIC
Glucose, UA: NEGATIVE mg/dL
Leukocytes, UA: NEGATIVE
Nitrite: NEGATIVE
Specific Gravity, Urine: 1.018 (ref 1.005–1.030)
pH: 5.5 (ref 5.0–8.0)

## 2010-04-23 LAB — COMPREHENSIVE METABOLIC PANEL
Albumin: 3.9 g/dL (ref 3.5–5.2)
Alkaline Phosphatase: 71 U/L (ref 39–117)
BUN: 8 mg/dL (ref 6–23)
CO2: 30 mEq/L (ref 19–32)
Chloride: 104 mEq/L (ref 96–112)
Creatinine, Ser: 0.91 mg/dL (ref 0.4–1.2)
GFR calc non Af Amer: >60 mL/min (ref >60)
Potassium: 3.1 mEq/L — ABNORMAL LOW (ref 3.5–5.1)
Total Bilirubin: 0.4 mg/dL (ref 0.3–1.2)

## 2010-04-23 LAB — DIFFERENTIAL
Band Neutrophils: 0 % (ref 0–10)
Blasts: 0 %
Lymphocytes Relative: 47 % — ABNORMAL HIGH (ref 12–46)
Metamyelocytes Relative: 0 %
Monocytes Absolute: 0.8 10*3/uL (ref 0.1–1.0)
Monocytes Relative: 9 % (ref 3–12)
Promyelocytes Absolute: 0 %
nRBC: 0 /100 WBC

## 2010-04-23 LAB — URINE MICROSCOPIC-ADD ON

## 2010-04-23 LAB — CANCER ANTIGEN 27.29: CA 27.29: 28 U/mL (ref 0–39)

## 2010-04-25 ENCOUNTER — Encounter (HOSPITAL_COMMUNITY)
Admission: RE | Admit: 2010-04-25 | Discharge: 2010-04-25 | Disposition: A | Discharge status: Home / Self Care | Payer: BC Managed Care – PPO | Source: Ambulatory Visit | Attending: Plastic Surgery | Admitting: Plastic Surgery

## 2010-04-25 DIAGNOSIS — C50919 Malignant neoplasm of unspecified site of unspecified female breast: Secondary | ICD-10-CM | POA: Insufficient documentation

## 2010-04-25 DIAGNOSIS — Z01812 Encounter for preprocedural laboratory examination: Secondary | ICD-10-CM | POA: Insufficient documentation

## 2010-04-25 LAB — CBC
MCH: 23.7 pg — ABNORMAL LOW (ref 26.0–34.0)
MCHC: 33.2 g/dL (ref 30.0–36.0)
MCV: 71.4 fL — ABNORMAL LOW (ref 78.0–100.0)
Platelets: 101 10*3/uL — ABNORMAL LOW (ref 150–400)
RDW: 16.1 % — ABNORMAL HIGH (ref 11.5–15.5)

## 2010-04-25 LAB — BASIC METABOLIC PANEL
BUN: 7 mg/dL (ref 6–23)
Calcium: 9.4 mg/dL (ref 8.4–10.5)
Chloride: 103 mEq/L (ref 96–112)
Creatinine, Ser: 0.73 mg/dL (ref 0.4–1.2)
GFR calc Af Amer: >60 mL/min (ref >60)
GFR calc non Af Amer: >60 mL/min (ref >60)

## 2010-04-25 LAB — SURGICAL PCR SCREEN: Staphylococcus aureus: NEGATIVE

## 2010-05-01 ENCOUNTER — Encounter (HOSPITAL_BASED_OUTPATIENT_CLINIC_OR_DEPARTMENT_OTHER): Payer: BC Managed Care – PPO | Admitting: Oncology

## 2010-05-01 ENCOUNTER — Other Ambulatory Visit: Payer: Self-pay | Admitting: Oncology

## 2010-05-01 DIAGNOSIS — I10 Essential (primary) hypertension: Secondary | ICD-10-CM

## 2010-05-01 DIAGNOSIS — D693 Immune thrombocytopenic purpura: Secondary | ICD-10-CM

## 2010-05-01 DIAGNOSIS — C50319 Malignant neoplasm of lower-inner quadrant of unspecified female breast: Secondary | ICD-10-CM

## 2010-05-01 LAB — COMPREHENSIVE METABOLIC PANEL
Alkaline Phosphatase: 50 U/L (ref 39–117)
BUN: 13 mg/dL (ref 6–23)
Glucose, Bld: 105 mg/dL — ABNORMAL HIGH (ref 70–99)
Sodium: 142 mEq/L (ref 135–145)
Total Bilirubin: 0.3 mg/dL (ref 0.3–1.2)

## 2010-05-01 LAB — CBC WITH DIFFERENTIAL/PLATELET
BASO%: 0.4 % (ref 0.0–2.0)
Basophils Absolute: 0 10*3/uL (ref 0.0–0.1)
EOS%: 1 % (ref 0.0–7.0)
HGB: 12.8 g/dL (ref 11.6–15.9)
MCH: 23.4 pg — ABNORMAL LOW (ref 25.1–34.0)
MCHC: 33.1 g/dL (ref 31.5–36.0)
MCV: 70.9 fL — ABNORMAL LOW (ref 79.5–101.0)
MONO%: 6 % (ref 0.0–14.0)
RDW: 16.8 % — ABNORMAL HIGH (ref 11.2–14.5)

## 2010-05-03 ENCOUNTER — Other Ambulatory Visit: Payer: Self-pay | Admitting: Oncology

## 2010-05-03 ENCOUNTER — Observation Stay (HOSPITAL_COMMUNITY)
Admission: RE | Admit: 2010-05-03 | Discharge: 2010-05-04 | Disposition: A | Discharge status: Home / Self Care | Payer: BC Managed Care – PPO | Source: Ambulatory Visit | Attending: Plastic Surgery | Admitting: Plastic Surgery

## 2010-05-03 DIAGNOSIS — C50919 Malignant neoplasm of unspecified site of unspecified female breast: Principal | ICD-10-CM | POA: Insufficient documentation

## 2010-05-03 DIAGNOSIS — E119 Type 2 diabetes mellitus without complications: Secondary | ICD-10-CM | POA: Insufficient documentation

## 2010-05-03 DIAGNOSIS — Z9010 Acquired absence of unspecified breast and nipple: Secondary | ICD-10-CM | POA: Insufficient documentation

## 2010-05-03 DIAGNOSIS — Z452 Encounter for adjustment and management of vascular access device: Secondary | ICD-10-CM | POA: Insufficient documentation

## 2010-05-03 DIAGNOSIS — Z01812 Encounter for preprocedural laboratory examination: Secondary | ICD-10-CM | POA: Insufficient documentation

## 2010-05-03 LAB — GLUCOSE, CAPILLARY

## 2010-05-04 LAB — GLUCOSE, CAPILLARY: Glucose-Capillary: 104 mg/dL — ABNORMAL HIGH (ref 70–99)

## 2010-05-08 LAB — POCT I-STAT, CHEM 8
Calcium, Ion: 1.22 mmol/L (ref 1.12–1.32)
Creatinine, Ser: 0.6 mg/dL (ref 0.4–1.2)
Glucose, Bld: 92 mg/dL (ref 70–99)
Hemoglobin: 15 g/dL (ref 12.0–15.0)
TCO2: 27 mmol/L (ref 0–100)

## 2010-06-07 ENCOUNTER — Encounter: Payer: Self-pay | Admitting: Family Medicine

## 2010-06-07 ENCOUNTER — Ambulatory Visit (INDEPENDENT_AMBULATORY_CARE_PROVIDER_SITE_OTHER): Payer: BC Managed Care – PPO | Admitting: Family Medicine

## 2010-06-07 VITALS — BP 144/84 | HR 90 | Temp 99.1°F | Ht 63.0 in | Wt 217.0 lb

## 2010-06-07 DIAGNOSIS — R739 Hyperglycemia, unspecified: Secondary | ICD-10-CM

## 2010-06-07 DIAGNOSIS — E119 Type 2 diabetes mellitus without complications: Secondary | ICD-10-CM | POA: Insufficient documentation

## 2010-06-07 DIAGNOSIS — Z0000 Encounter for general adult medical examination without abnormal findings: Secondary | ICD-10-CM

## 2010-06-07 DIAGNOSIS — M79609 Pain in unspecified limb: Secondary | ICD-10-CM

## 2010-06-07 DIAGNOSIS — I10 Essential (primary) hypertension: Secondary | ICD-10-CM

## 2010-06-07 DIAGNOSIS — M79646 Pain in unspecified finger(s): Secondary | ICD-10-CM

## 2010-06-07 DIAGNOSIS — R7309 Other abnormal glucose: Secondary | ICD-10-CM

## 2010-06-07 DIAGNOSIS — Z01419 Encounter for gynecological examination (general) (routine) without abnormal findings: Secondary | ICD-10-CM | POA: Insufficient documentation

## 2010-06-07 DIAGNOSIS — C50919 Malignant neoplasm of unspecified site of unspecified female breast: Secondary | ICD-10-CM

## 2010-06-07 DIAGNOSIS — E118 Type 2 diabetes mellitus with unspecified complications: Secondary | ICD-10-CM | POA: Insufficient documentation

## 2010-06-07 NOTE — Assessment & Plan Note (Signed)
several elevated CBGs. Will check A1C. Stressed importance of diet and exercise

## 2010-06-07 NOTE — Assessment & Plan Note (Signed)
S/p reconstruction.  On tamoxifen 20mg  PO q day.  Following with heme/onc.

## 2010-06-07 NOTE — Assessment & Plan Note (Signed)
>>  ASSESSMENT AND PLAN FOR DM (DIABETES MELLITUS) WITH COMPLICATIONS (HCC) WRITTEN ON 06/07/2010  3:18 PM BY SPIEGEL, RACHEL L, MD  several elevated CBGs. Will check A1C. Stressed importance of diet and exercise

## 2010-06-07 NOTE — Patient Instructions (Signed)
Please make an appt for lab work in the AM Come without eating or drinking except for black coffee or water  For your thumb- please get the thumb splint from [facility] or other medical supply Please go to a compounding pharmacy for the ketoprofen Come back in 2-3 weeks if no better or worse  For your cholesterol and sugar: Please try to eat limited amounts of sweets and fried foods Try for 5 servings of veggies daily Try to walk 30 mins/day

## 2010-06-07 NOTE — Progress Notes (Signed)
  Subjective:    Patient ID: [name], female    DOB: [birth year], 50 y.o.   MRN: [id number]  HPI Pt here for CPE.  No d/c no odor no complaints or concerns.  No new partners.   Pt notes that last several CBGs checked at onc have been over 100.  Would like to be evaluated for DM II.  2 sisters w/ DM.  No polyuria/polydipsia Pt states that thumb pain has continued.  Seen by Dr. Karie Schwalbe who felt she would benefit from injection.  Pt declined injection today and wants to try other treatments.  She has been avoiding NSAIDs due to thrombocytopenia.  Pt's social hx and family hx reviewed  Review of Systems    denies CP, SOB, or blurry vision Objective:   Physical Exam     Vital signs reviewed General appearance - alert, well appearing, and in no distress and oriented to person, place, and time Eyes - pupils equal and reactive, extraocular eye movements intact, sclera anicteric Ears - bilateral TM's and external ear canals normal, right ear normal, left ear normal Nose - normal and patent, no erythema, discharge or polyps Neck - supple, no significant adenopathy Chest - clear to auscultation, no wheezes, rales or rhonchi, symmetric air entry, no tachypnea, retractions or cyanosis Heart - normal rate, regular rhythm, normal S1, S2, no murmurs, rubs, clicks or gallops Abdomen - soft, nontender, nondistended, no masses or organomegaly GYN- external genetalia normal, without lesions.  Vagina normal color, rugations, without discharge.  Vaginal cuff present, no cervix MSK: left thumb TTP, no edema or erythema.  Pos finkelstein.     Assessment & Plan:  Breast Ca- S/p reconstruction.  On tamoxifen 20mg  PO q day.  Following with heme/onc.    Thumb pain- ? If this is due to tendonitis vs arthritis or caused by tamoxifen.  Will try 2 weeks of thumb spica splint and topical ketoprofen (to avoid systemic effect).  RTC in 2 weeks if not improved.  WWE- did not do pap today.  Vaginal cuff without  cervix.  Had hysterctomy for fibroid.  Will not need paps in future.  Will check lipids, CMET, CBC and A1C  Hyperglycemia- several elevated CBGs.  Will check A1C.  Stressed importance of diet and exercise

## 2010-06-07 NOTE — Assessment & Plan Note (Signed)
?   If this is due to tendonitis vs arthritis or caused by tamoxifen.  Will try 2 weeks of thumb spica splint and topical ketoprofen (to avoid systemic effect).  RTC in 2 weeks if not improved.

## 2010-06-11 ENCOUNTER — Other Ambulatory Visit (INDEPENDENT_AMBULATORY_CARE_PROVIDER_SITE_OTHER): Payer: BC Managed Care – PPO

## 2010-06-11 DIAGNOSIS — R7309 Other abnormal glucose: Secondary | ICD-10-CM

## 2010-06-11 DIAGNOSIS — R739 Hyperglycemia, unspecified: Secondary | ICD-10-CM

## 2010-06-11 DIAGNOSIS — I10 Essential (primary) hypertension: Secondary | ICD-10-CM

## 2010-06-11 LAB — COMPREHENSIVE METABOLIC PANEL
ALT: 23 U/L (ref 0–35)
AST: 21 U/L (ref 0–37)
Albumin: 3.9 g/dL (ref 3.5–5.2)
BUN: 11 mg/dL (ref 6–23)
CO2: 27 mEq/L (ref 19–32)
Calcium: 9.7 mg/dL (ref 8.4–10.5)
Chloride: 105 mEq/L (ref 96–112)
Creat: 0.72 mg/dL (ref 0.40–1.20)
Potassium: 3.7 mEq/L (ref 3.5–5.3)

## 2010-06-11 LAB — LIPID PANEL
Cholesterol: 205 mg/dL — ABNORMAL HIGH (ref 0–200)
HDL: 57 mg/dL (ref >39)
Total CHOL/HDL Ratio: 3.6 Ratio

## 2010-06-11 NOTE — Progress Notes (Signed)
Drew  CMP, FLP, CBC, A1c = 6.0 % BAJORDAN, MLS

## 2010-06-12 LAB — CBC
HCT: 38.5 % (ref 36.0–46.0)
MCH: 23.9 pg — ABNORMAL LOW (ref 26.0–34.0)
MCV: 75.3 fL — ABNORMAL LOW (ref 78.0–100.0)
Platelets: 84 10*3/uL — ABNORMAL LOW (ref 150–400)
RBC: 5.11 MIL/uL (ref 3.87–5.11)
WBC: 5.6 10*3/uL (ref 4.0–10.5)

## 2010-06-14 ENCOUNTER — Encounter: Payer: Self-pay | Admitting: Family Medicine

## 2010-06-14 ENCOUNTER — Telehealth: Payer: Self-pay | Admitting: Family Medicine

## 2010-06-14 NOTE — Telephone Encounter (Signed)
Labs just resulted.  Please see letter.

## 2010-06-14 NOTE — Telephone Encounter (Signed)
Pt asking for lab results from 06/11/10, has not received a letter.

## 2010-06-18 ENCOUNTER — Other Ambulatory Visit: Payer: Self-pay | Admitting: Family Medicine

## 2010-06-18 NOTE — Telephone Encounter (Signed)
Refill request

## 2010-06-22 NOTE — Op Note (Signed)
[place]  Patient:    [name], [name] Visit Number: [id number] MRN: [id number]          Service Type: DSU Location: [id number] Attending Physician:  Bonnetta Barry Dictated by:   Velora Heckler, M.D. Proc. Date: 07/02/01 Admit Date:  07/02/2001 Discharge Date: 07/03/2001   CC:         [facility]  Lennis P. Darrold Span, M.D., [facility]  Bari Mantis, M.D., [facility]   Operative Report  PREOPERATIVE DIAGNOSIS:  Primary hypoparathyroidism.  POSTOPERATIVE DIAGNOSIS:  Primary hypoparathyroidism.  OPERATION:  Minimally invasive parathyroidectomy (right inferior gland).  SURGEON:  Velora Heckler, M.D.  FIRST ASSISTANT:  Ollen Gross. Vernell Morgans, M.D.  ANESTHESIA:  General per Kaylyn Layer. Michelle Piper, M.D.  ESTIMATED BLOOD LOSS: Minimal.  PREPARATION:  Betadine.  COMPLICATIONS:  None.  INDICATIONS:  The patient is a 50 year old black female who presents at the request of Dr. Annamarie Major at [facility] for hypercalcemia.  Workup revealed calcium levels ranging from 11.0 to 12.0.  Intact PTH level was elevated at 259.  Sestamibi scan was obtained which showed intense uptake in the right inferior neck consistent with parathyroid adenoma.  The patient now comes to surgery for minimally invasive parathyroidectomy.  DESCRIPTION OF PROCEDURE:  The procedure was done in OR #17 at the [facility].  The patient was brought to the operating room, placed in a supine position on the operating room table.  Following administration of general anesthesia, the patient was prepped and draped in the usual strict aseptic fashion.  After ascertaining that adequate level of anesthesia had been obtained, the patient is positioned and draped in the usual strict aseptic fashion.  Next, the neck is scanned with the NeoProbe.  A point of markedly increased uptake is seen in the right  inferior neck compared to background levels.  This is marked with a marking pen.  A 3 cm incision is then made with a #15 blade over the right inferior neck just above the head of the clavicle.  Dissection is carried down through the subcutaneous tissue and platysma.  Hemostasis is obtained with the electrocautery.  Subplatysmal flaps are developed cephalad and caudad.  A Weitlaner self-retaining retractor is placed for exposure. Strap muscles are incised in the midline and dissection carried down to the trachea.   Strap muscles were reflected laterally.  Dissection is carried along the tracheoesophageal groove.  Great care was taken to preserve the recurrent laryngeal nerve.  A very large parathyroid adenoma was identified. It was carefully dissected out.  Vascular tributaries were divided between small Ligaclips.  Gland is mobilized, and remaining venous tributaries are divided between small Ligaclips.  Gland is completely excised.  It is quite large, measuring approximately 3.5 x 3 x 1.5 cm in size.  It is submitted fresh to pathology where frozen section confirms parathyroid tissue consistent with parathyroid adenoma.  Good hemostasis is obtained in the right neck. Recurrent laryngeal nerve is visualized and is uninjured.  Strap muscles are reapproximated in the midline with interrupted 3-0 Vicryl sutures.  Platysma is reapproximated with interrupted 3-0 Vicryl sutures.  Skin edges are reapproximated with interrupted 4-0 Vicryl subcuticular sutures.  Skin is anesthetized with Marcaine anesthetic.  Steri-Strips are applied.  Sterile gauze dressings are applied.  The patient is awakened from anesthesia and brought to the recovery room in stable condition.  The patient tolerated the procedure well. Dictated by:  Velora Heckler, M.D. Attending Physician:  Bonnetta Barry DD:  07/02/01 TD:  07/03/01 Job: [id number]

## 2010-06-28 ENCOUNTER — Encounter (INDEPENDENT_AMBULATORY_CARE_PROVIDER_SITE_OTHER): Payer: Self-pay | Admitting: General Surgery

## 2010-07-04 NOTE — Op Note (Signed)
[name], [name]              ACCOUNT NO.:  [id number]  MEDICAL RECORD NO.:  [id number]           PATIENT TYPE:  O  LOCATION:  SDS                          FACILITY:  [facility]  PHYSICIAN:  Etter Sjogren, M.D.     DATE OF BIRTH:  02-27-1960  DATE OF PROCEDURE:  05/03/2010 DATE OF DISCHARGE:  04/25/2010                              OPERATIVE REPORT   PREOPERATIVE DIAGNOSIS:  Right breast cancer.  POSTOPERATIVE DIAGNOSIS:  Right breast cancer.  PROCEDURE PERFORMED: 1. Removal of a tissue expander, right chest and placement of saline     implant. 2. Distinct procedure, removal of Port-A-Cath.  SURGEON:  Etter Sjogren, MD  ANESTHESIA:  General.  ESTIMATED BLOOD LOSS:  10 mL.  DRAINS:  One 19-French.  CLINICAL NOTE:  This 50 year old woman had breast cancer and has had mastectomy with reconstruction using tissue expander now presents for removal of expander and placement of implant.  In addition, it is time we remove the Port-A-Cath as well and Dr. Derrell Lolling, our general surgeon requested that I remove it.  Ashby Dawes of these procedures, risks plus complications were discussed with her in great detail.  She had originally been offered a left breast reduction.  For symmetry, her left breast is quite large and ptotic.  At the last moment, she declined and decided to not have the breast reduction and just have the breast reconstruction on the right side.  Risks include but not limited to bleeding, infection, anesthesia complications, healing problems, scarring, fluid collections, loss of sensation, loss of tissue, asymmetry disappointment, capsular contracture, failure of implant, displacement of implant, wrinkles, ripples, pneumothorax, pulmonary embolism, and she understands all these things and wished to proceed.  PROCEDURE IN DETAIL:  The patient was marked in the holding area, taken to the operating room, and placed supine.  After successful induction of general anesthesia,  prepped with ChloraPrep and after waiting full 3 minutes for drying, she was draped with sterile drapes.  The lateral aspect the old mastectomy incision was utilized and dissection carried down directly through subcutaneous tissue and muscle and the underlying tissue expander was identified and was gently mobilized and removed. The space was inspected and the muscle was released inferiorly in order to allow for some further relaxation.  Care was taken to avoid lowering the space.  Thorough irrigation with saline and thorough irrigation with antibiotic solution and hemostasis was confirmed.  A 19-French drain was positioned and brought through separate stab wound inferolaterally and secured with 3-0 Prolene suture.  After thoroughly cleaned gloves, the implant was prepared.  This was a Mentor smooth round high profile saline implant.  150 mL of sterile saline placed using a closed filling system and antibiotic solution was placed with the implant allowed to soak in it.  Antibiotic solution also placed in the space as well and soaked there as well.  3-0 Vicryl sutures were preplaced but left untied as figure-of-eight sutures for the muscle layer closure and after inspecting the space with hemostasis confirmed, the implant positioned and filled to the maximum 960 mL, again using the closed filling system. Care was taken to make sure  that the implant was oriented properly and that the filled port was located centrally and was upright.  The 3-0 Vicryl sutures were then tied securely.  The wound was irrigated again with saline and the remainder of the closure with 3-0 Monocryl interrupted inverted deep dermal sutures and a running 4-0 Monocryl subcuticular suture.  Attention was directed to the Port-A-Cath where the old scar was incised and dissection carried down through subcutaneous tissue.  The underlying Port-A-Cath identified.  The 3 retaining Prolene sutures were removed and the patient  was placed in Trendelenburg and the Port-A-Cath removed. Gentle pressure maintained for a minute and excellent hemostasis was noted and the wound irrigated with saline and the closure with 3-0 Monocryl interrupted inverted deep sutures and 3-0 Monocryl interrupted deep dermal running 3-0 Monocryl subcuticular suture.  Steri-Strips, dry sterile dressing, and the breast vest was placed and she was transferred to recovery stable having tolerated the procedure well.  DISPOSITION:  She will be observed in the RCC.     Etter Sjogren, M.D.     DB/MEDQ  D:  05/03/2010  T:  05/04/2010  Job:  [id number]  Electronically Signed by Etter Sjogren M.D. on 07/04/2010 07:46:01 AM

## 2010-08-06 ENCOUNTER — Other Ambulatory Visit: Payer: Self-pay | Admitting: Family Medicine

## 2010-08-06 MED ORDER — CARVEDILOL 6.25 MG PO TABS: 6.2500 mg | ORAL_TABLET | Freq: Two times a day (BID) | ORAL | Status: DC

## 2010-08-09 ENCOUNTER — Telehealth: Payer: Self-pay | Admitting: Family Medicine

## 2010-08-09 NOTE — Telephone Encounter (Signed)
Called pharmacy and they did not receive RX for coreg. Called in verbally and gave # 60 tabs with 3 refills which will last a month  instead of # 30 tabs.

## 2010-08-09 NOTE — Telephone Encounter (Signed)
Pt calling re: her rx for coreg, med list says it was sent to [facility] on 7/2 but pt just left the pharmacy and they do not have it.

## 2010-08-20 ENCOUNTER — Inpatient Hospital Stay (INDEPENDENT_AMBULATORY_CARE_PROVIDER_SITE_OTHER)
Admission: RE | Admit: 2010-08-20 | Discharge: 2010-08-20 | Disposition: A | Discharge status: Home / Self Care | Payer: BC Managed Care – PPO | Source: Ambulatory Visit | Attending: Family Medicine | Admitting: Family Medicine

## 2010-08-20 DIAGNOSIS — B354 Tinea corporis: Secondary | ICD-10-CM

## 2010-08-23 ENCOUNTER — Encounter (INDEPENDENT_AMBULATORY_CARE_PROVIDER_SITE_OTHER): Payer: BC Managed Care – PPO | Admitting: General Surgery

## 2010-08-23 ENCOUNTER — Ambulatory Visit (INDEPENDENT_AMBULATORY_CARE_PROVIDER_SITE_OTHER): Payer: BC Managed Care – PPO | Admitting: General Surgery

## 2010-08-28 ENCOUNTER — Encounter (INDEPENDENT_AMBULATORY_CARE_PROVIDER_SITE_OTHER): Payer: Self-pay | Admitting: General Surgery

## 2010-08-28 ENCOUNTER — Ambulatory Visit (INDEPENDENT_AMBULATORY_CARE_PROVIDER_SITE_OTHER): Payer: BC Managed Care – PPO | Admitting: General Surgery

## 2010-08-28 DIAGNOSIS — C50919 Malignant neoplasm of unspecified site of unspecified female breast: Secondary | ICD-10-CM

## 2010-08-28 DIAGNOSIS — C50911 Malignant neoplasm of unspecified site of right female breast: Secondary | ICD-10-CM

## 2010-08-28 DIAGNOSIS — R229 Localized swelling, mass and lump, unspecified: Secondary | ICD-10-CM

## 2010-08-28 NOTE — Patient Instructions (Signed)
Exam of your left breast and the left breast mammogram looked fine. The small nodule in the skin of your right mastectomy incision is probably scar tissue. I do advise that this area be biopsied to rule out local recurrence of your breast cancer. We will schedule this in the near future. I have called Dr. Etter Sjogren office and asked him to call me to discuss this.

## 2010-08-28 NOTE — Progress Notes (Signed)
Subjective:     Patient ID: [name], female   DOB: [birth year], 50 y.o.   MRN: [id number]  HPI Patient returns for long-term followup of her right breast cancer. She underwent right total mastectomy, right central and biopsy and tissue expander insertion and insertion of power port on July 20, 2009. Dr. Odis Lawson has exchanged the tissue expander for an implant.  The only complaint she has is a small skin nodule in the right mastectomy incision. She has no complaints about her left breast.  Left mammogram was performed on April 11, 2010. And this looks normal. No focal abnormality. BI-RADS category one.  She continues followup with Dr. Park Lawson and Dr. Maryelizabeth Lawson.  Past Medical History  Diagnosis Date  . Breast ca   . Hypertension   . Hyperlipidemia   . Thrombocytopenia   . Diabetes mellitus     Current Outpatient Prescriptions  Medication Sig Dispense Refill  . acetaminophen (TYLENOL) 500 MG tablet Take 500 mg by mouth as needed.       . carvedilol (COREG) 6.25 MG tablet Take 1 tablet (6.25 mg total) by mouth 2 (two) times daily with a meal.  30 tablet  3  . hydrochlorothiazide 25 MG tablet Take 25 mg by mouth daily.        . tamoxifen (NOLVADEX) 20 MG tablet Take 20 mg by mouth daily.        . pravastatin (PRAVACHOL) 40 MG tablet Take 40 mg by mouth daily.          No Known Allergies  Family History  Problem Relation Age of Onset  . Heart disease Mother   . Stroke Father   . Heart disease Brother     History  Substance Use Topics  . Smoking status: Never Smoker   . Smokeless tobacco: Not on file  . Alcohol Use: No    Review of Systems  Constitutional: Negative.   HENT: Negative.   Eyes: Negative.   Respiratory: Negative.   Cardiovascular: Negative.   Gastrointestinal: Negative.   Genitourinary: Negative.   Musculoskeletal: Negative.   Skin: Positive for wound. Negative for color change, pallor and rash.       Objective:   Physical Exam    Constitutional: She appears well-developed and well-nourished. No distress.  HENT:  Head: Normocephalic and atraumatic.  Neck: No JVD present. No tracheal deviation present. No thyromegaly present.  Pulmonary/Chest: Effort normal and breath sounds normal. No respiratory distress. She has no wheezes. She exhibits no tenderness.    Lymphadenopathy:    She has no cervical adenopathy.       Assessment:     Multicentric invasive ductal carcinoma right breast, pathologic stage TII, N0, receptor positive, HER-2/neu negative. Status post right mastectomy and implant reconstruction.  Skin nodule of right mastectomy scar. Likely benign fibrosis. Biopsy indicated to rule out dermal recurrence.  Normal exam and mammogram of left breast.   Plan:     Schedule for excisional biopsy of small nodule of right mastectomy scar.  I put in a call to Dr. Etter Lawson to discuss this.  I discussed the indications and details of this biopsy with the patient. Risks and complications have been outlined, including but not limited to bleeding, infection, injury to the implant necessitating removal of the implant, and other on proceeding problems. She understands all this well. All her questions were answered. She is very much in favor of having this done to rule out recurrent cancer.  Mammogram of left breast  in March of 2013.

## 2010-08-29 ENCOUNTER — Ambulatory Visit: Payer: BC Managed Care – PPO | Admitting: Family Medicine

## 2010-09-12 ENCOUNTER — Other Ambulatory Visit (INDEPENDENT_AMBULATORY_CARE_PROVIDER_SITE_OTHER): Payer: Self-pay | Admitting: General Surgery

## 2010-09-12 ENCOUNTER — Ambulatory Visit (HOSPITAL_BASED_OUTPATIENT_CLINIC_OR_DEPARTMENT_OTHER)
Admission: RE | Admit: 2010-09-12 | Discharge: 2010-09-12 | Disposition: A | Discharge status: Home / Self Care | Payer: BC Managed Care – PPO | Source: Ambulatory Visit | Attending: General Surgery | Admitting: General Surgery

## 2010-09-12 DIAGNOSIS — C50919 Malignant neoplasm of unspecified site of unspecified female breast: Secondary | ICD-10-CM

## 2010-09-12 HISTORY — PX: BREAST SURGERY: SHX581

## 2010-09-13 ENCOUNTER — Telehealth (INDEPENDENT_AMBULATORY_CARE_PROVIDER_SITE_OTHER): Payer: Self-pay

## 2010-09-13 NOTE — Telephone Encounter (Signed)
Pt home doing well. PO appt made. Pt to call with concerns. 

## 2010-09-13 NOTE — Op Note (Signed)
  [name], [name]              ACCOUNT NO.:  [id number]  MEDICAL RECORD NO.:  [id number]  LOCATION:                                 FACILITY:  PHYSICIAN:  Angelia Mould. Derrell Lolling, M.D.DATE OF BIRTH:  Mar 14, 1960  DATE OF PROCEDURE:  09/12/2010 DATE OF DISCHARGE:                              OPERATIVE REPORT   PREOPERATIVE DIAGNOSIS:  Mass of right mastectomy skin and soft tissue, rule out recurrent breast cancer.  POSTOPERATIVE DIAGNOSIS:  Mass of right mastectomy skin and soft tissue, rule out recurrent breast cancer.  OPERATION PERFORMED:  Excision of skin, dermis, underlying breast tissue, right breast.  SURGEON:  Angelia Mould. Derrell Lolling, MD.  OPERATIVE INDICATIONS:  This is a 50 year old African American female, who was treated for breast cancer last year.  She underwent right total mastectomy, sentinel node biopsy, and tissue expander insertion and power port on July 20, 2009.  She has had a tissue expander exchange for permanent implant.  She comes back to Dr. Etter Sjogren and to me complaining of a palpable nodule in the center of the mastectomy scar. On exam, she has a well-healed scar with good-looking reconstruction. In the center of the scar, there is a 1 cm firm rubbery mass involving the dermis and the underlying soft tissue.  This is not ulcerated. There is no adenopathy.  It is felt that this is most likely a fibrotic process, but excision is desired to rule out recurrent cancer.  OPERATIVE TECHNIQUE:  The patient was brought to the operating room at [facility].  The procedure was performed under local anesthesia.  Surgical time-out was held, identifying correct patient, correct procedure, and correct site.  The right breast was prepped and draped in a sterile fashion.  1% Xylocaine with epinephrine mixed with sodium bicarb was used as local anesthetic.  Overlying the small palpable mass, I made a transverse elliptical incision in the direction of the  old scar.  Dissection was carried down through the skin and the dermis and the tissue beneath was also somewhat firm.  This looked like chronic scar tissue and granulation tissue.  This was excised.  We did not see any muscle tissue and we felt that we stayed well away from her implant.  There was no purulence or infection.  We closed the skin back with several interrupted subcuticular sutures of 4-0 Vicryl and Dermabond.  She tolerated the procedure well and was taken back to the preop area in excellent condition.  Estimated blood loss was about 3 mL. Complications none.  Sponge, needle, and instrument counts were correct.     Angelia Mould. Derrell Lolling, M.D.     HMI/MEDQ  D:  09/12/2010  T:  09/12/2010  Job:  [id number]  cc:   Etter Sjogren, M.D. Drue Second, M.D. Maryelizabeth Rowan, M.D.  Electronically Signed by Claud Kelp M.D. on 09/13/2010 06:59:14 AM

## 2010-09-14 ENCOUNTER — Other Ambulatory Visit: Payer: Self-pay | Admitting: Oncology

## 2010-09-14 ENCOUNTER — Telehealth (INDEPENDENT_AMBULATORY_CARE_PROVIDER_SITE_OTHER): Payer: Self-pay | Admitting: General Surgery

## 2010-09-14 DIAGNOSIS — C50919 Malignant neoplasm of unspecified site of unspecified female breast: Secondary | ICD-10-CM

## 2010-09-14 NOTE — Telephone Encounter (Signed)
Skin biopsy positive for invasive breast cancer. Two fragments with positive margins. I called patient and informed her of this. She is to see me Monday, Aug. 13. I called Val at [facility]. Dr. Welton Flakes on vacation. Val will review with Dr. Darnelle Catalan and schedule restaging PET and breast MRI and call in.  Patient will ultimately need wide local resection with negative margins.

## 2010-09-17 ENCOUNTER — Other Ambulatory Visit: Payer: Self-pay | Admitting: Oncology

## 2010-09-17 DIAGNOSIS — C50911 Malignant neoplasm of unspecified site of right female breast: Secondary | ICD-10-CM

## 2010-09-17 DIAGNOSIS — N6489 Other specified disorders of breast: Secondary | ICD-10-CM

## 2010-09-18 ENCOUNTER — Encounter (HOSPITAL_BASED_OUTPATIENT_CLINIC_OR_DEPARTMENT_OTHER): Payer: BC Managed Care – PPO | Admitting: Oncology

## 2010-09-18 ENCOUNTER — Other Ambulatory Visit: Payer: Self-pay | Admitting: Oncology

## 2010-09-18 DIAGNOSIS — D693 Immune thrombocytopenic purpura: Secondary | ICD-10-CM

## 2010-09-18 DIAGNOSIS — C50319 Malignant neoplasm of lower-inner quadrant of unspecified female breast: Secondary | ICD-10-CM

## 2010-09-18 DIAGNOSIS — I10 Essential (primary) hypertension: Secondary | ICD-10-CM

## 2010-09-18 LAB — CBC WITH DIFFERENTIAL/PLATELET
BASO%: 0.4 % (ref 0.0–2.0)
EOS%: 1.3 % (ref 0.0–7.0)
Eosinophils Absolute: 0.1 10*3/uL (ref 0.0–0.5)
LYMPH%: 48.5 % (ref 14.0–49.7)
MCH: 24.7 pg — ABNORMAL LOW (ref 25.1–34.0)
MCHC: 32.5 g/dL (ref 31.5–36.0)
MCV: 76.1 fL — ABNORMAL LOW (ref 79.5–101.0)
MONO%: 6.3 % (ref 0.0–14.0)
NEUT#: 3.2 10*3/uL (ref 1.5–6.5)
RBC: 5.22 10*6/uL (ref 3.70–5.45)
RDW: 14.9 % — ABNORMAL HIGH (ref 11.2–14.5)
nRBC: 0 % (ref 0–0)

## 2010-09-18 LAB — COMPREHENSIVE METABOLIC PANEL
ALT: 57 U/L — ABNORMAL HIGH (ref 0–35)
AST: 54 U/L — ABNORMAL HIGH (ref 0–37)
Alkaline Phosphatase: 56 U/L (ref 39–117)
Creatinine, Ser: 0.92 mg/dL (ref 0.50–1.10)
Sodium: 140 mEq/L (ref 135–145)
Total Bilirubin: 0.2 mg/dL — ABNORMAL LOW (ref 0.3–1.2)
Total Protein: 7.2 g/dL (ref 6.0–8.3)

## 2010-09-21 ENCOUNTER — Ambulatory Visit (HOSPITAL_COMMUNITY)
Admission: RE | Admit: 2010-09-21 | Discharge: 2010-09-21 | Disposition: A | Discharge status: Home / Self Care | Payer: BC Managed Care – PPO | Source: Ambulatory Visit | Attending: Oncology | Admitting: Oncology

## 2010-09-21 ENCOUNTER — Encounter (HOSPITAL_COMMUNITY)
Admission: RE | Admit: 2010-09-21 | Discharge: 2010-09-21 | Disposition: A | Discharge status: Home / Self Care | Payer: BC Managed Care – PPO | Source: Ambulatory Visit | Attending: Oncology | Admitting: Oncology

## 2010-09-21 DIAGNOSIS — K7689 Other specified diseases of liver: Secondary | ICD-10-CM | POA: Insufficient documentation

## 2010-09-21 DIAGNOSIS — C50919 Malignant neoplasm of unspecified site of unspecified female breast: Secondary | ICD-10-CM | POA: Insufficient documentation

## 2010-09-21 MED ORDER — IOHEXOL 300 MG/ML  SOLN
80.0000 mL | Freq: Once | INTRAMUSCULAR | Status: AC | PRN
  Administered 2010-09-21: 80 mL via INTRAVENOUS

## 2010-09-24 ENCOUNTER — Ambulatory Visit (INDEPENDENT_AMBULATORY_CARE_PROVIDER_SITE_OTHER): Payer: BC Managed Care – PPO | Admitting: General Surgery

## 2010-09-24 ENCOUNTER — Encounter (INDEPENDENT_AMBULATORY_CARE_PROVIDER_SITE_OTHER): Payer: Self-pay | Admitting: General Surgery

## 2010-09-24 ENCOUNTER — Encounter (HOSPITAL_COMMUNITY)
Admission: RE | Admit: 2010-09-24 | Discharge: 2010-09-24 | Disposition: A | Discharge status: Home / Self Care | Payer: BC Managed Care – PPO | Source: Ambulatory Visit | Attending: Oncology | Admitting: Oncology

## 2010-09-24 ENCOUNTER — Encounter (HOSPITAL_COMMUNITY): Payer: Self-pay

## 2010-09-24 VITALS — BP 150/98 | HR 76 | Temp 97.2°F

## 2010-09-24 DIAGNOSIS — C50919 Malignant neoplasm of unspecified site of unspecified female breast: Secondary | ICD-10-CM | POA: Insufficient documentation

## 2010-09-24 DIAGNOSIS — D0590 Unspecified type of carcinoma in situ of unspecified breast: Secondary | ICD-10-CM

## 2010-09-24 DIAGNOSIS — Z9010 Acquired absence of unspecified breast and nipple: Secondary | ICD-10-CM | POA: Insufficient documentation

## 2010-09-24 MED ORDER — FLUDEOXYGLUCOSE F - 18 (FDG) INJECTION
16.8000 | Freq: Once | INTRAVENOUS | Status: AC | PRN
  Administered 2010-09-24: 16.8 via INTRAVENOUS

## 2010-09-24 NOTE — Progress Notes (Signed)
Subjective:     Patient ID: [name], female   DOB: 11-Jun-1960, 50 y.o.   MRN: [id number]  HPI Excisional biopsy of the nodule in her mastectomy scar revealed recurrent breast cancer. She is aware of this and is aware that further therapy will be necessary. I have discussed this with Dr. Drue Second and we have discussed this in breast multidisciplinary conference.  The plan is to proceed with wide local excision of this area to get negative margins and then consider radiation therapy. She is seeing Dr. Chipper Herb tomorrow.  Dr. Drue Second obtained a PET CT today and that is negative. Also obtained was a CT scan of the chest which shows a stable right upper lobe nodule unchanged from prior CT and a fatty liver. The sign of any significant metastatic disease on either x-ray. I informed the patient of these results. Review of Systems     Objective:   Physical Exam The wound in her right mastectomy scar is healing uneventfully. No sign of hematoma or infection.    Assessment:     Recurrent breast cancer and mastectomy scar. Status post biopsy with positive margins.  No evidence of distant metastatic disease.    Plan:     Schedule for a wide local excision of this area of the mastectomy skin. I think we can do this with a 4 cm elliptical incision and leave the implant in place.  I told her that there is a possibility of bleeding and infection. I told her there was a possibility of damage and removal of the implant. I told her there was a possibility that we might have positive margins and might have to go back for a third operation. She understands all these issues. All of her questions are answered. She is in full agreement with this plan.

## 2010-09-24 NOTE — Patient Instructions (Signed)
We will schedule for wide local excision of the recurrent cancer in the skin of the right breast in the very near future.

## 2010-09-25 ENCOUNTER — Ambulatory Visit
Admission: RE | Admit: 2010-09-25 | Discharge: 2010-09-25 | Disposition: A | Discharge status: Home / Self Care | Payer: BC Managed Care – PPO | Source: Ambulatory Visit | Attending: Radiation Oncology | Admitting: Radiation Oncology

## 2010-09-25 DIAGNOSIS — E119 Type 2 diabetes mellitus without complications: Secondary | ICD-10-CM | POA: Insufficient documentation

## 2010-09-25 DIAGNOSIS — C50319 Malignant neoplasm of lower-inner quadrant of unspecified female breast: Secondary | ICD-10-CM | POA: Insufficient documentation

## 2010-09-25 DIAGNOSIS — L538 Other specified erythematous conditions: Secondary | ICD-10-CM | POA: Insufficient documentation

## 2010-09-25 DIAGNOSIS — Z510 Encounter for antineoplastic radiation therapy: Secondary | ICD-10-CM | POA: Insufficient documentation

## 2010-09-25 DIAGNOSIS — R232 Flushing: Secondary | ICD-10-CM | POA: Insufficient documentation

## 2010-09-25 DIAGNOSIS — Z79899 Other long term (current) drug therapy: Secondary | ICD-10-CM | POA: Insufficient documentation

## 2010-09-25 DIAGNOSIS — I10 Essential (primary) hypertension: Secondary | ICD-10-CM | POA: Insufficient documentation

## 2010-09-26 ENCOUNTER — Ambulatory Visit (HOSPITAL_COMMUNITY)
Admission: RE | Admit: 2010-09-26 | Discharge: 2010-09-26 | Disposition: A | Discharge status: Home / Self Care | Payer: BC Managed Care – PPO | Source: Ambulatory Visit | Attending: General Surgery | Admitting: General Surgery

## 2010-09-26 DIAGNOSIS — C50919 Malignant neoplasm of unspecified site of unspecified female breast: Secondary | ICD-10-CM

## 2010-09-26 DIAGNOSIS — Z01810 Encounter for preprocedural cardiovascular examination: Secondary | ICD-10-CM | POA: Insufficient documentation

## 2010-09-26 DIAGNOSIS — E119 Type 2 diabetes mellitus without complications: Secondary | ICD-10-CM | POA: Insufficient documentation

## 2010-09-26 DIAGNOSIS — E669 Obesity, unspecified: Secondary | ICD-10-CM | POA: Insufficient documentation

## 2010-09-26 DIAGNOSIS — R9431 Abnormal electrocardiogram [ECG] [EKG]: Secondary | ICD-10-CM | POA: Insufficient documentation

## 2010-09-26 DIAGNOSIS — D693 Immune thrombocytopenic purpura: Secondary | ICD-10-CM | POA: Insufficient documentation

## 2010-09-26 DIAGNOSIS — I10 Essential (primary) hypertension: Secondary | ICD-10-CM | POA: Insufficient documentation

## 2010-09-26 DIAGNOSIS — Z01812 Encounter for preprocedural laboratory examination: Secondary | ICD-10-CM | POA: Insufficient documentation

## 2010-09-26 DIAGNOSIS — Z79899 Other long term (current) drug therapy: Secondary | ICD-10-CM | POA: Insufficient documentation

## 2010-09-26 LAB — CBC
MCV: 75.6 fL — ABNORMAL LOW (ref 78.0–100.0)
Platelets: 100 10*3/uL — ABNORMAL LOW (ref 150–400)
RDW: 15.3 % (ref 11.5–15.5)
WBC: 8.3 10*3/uL (ref 4.0–10.5)

## 2010-09-26 LAB — GLUCOSE, CAPILLARY: Glucose-Capillary: 84 mg/dL (ref 70–99)

## 2010-09-26 LAB — APTT: aPTT: 28 seconds (ref 24–37)

## 2010-09-27 ENCOUNTER — Other Ambulatory Visit (INDEPENDENT_AMBULATORY_CARE_PROVIDER_SITE_OTHER): Payer: Self-pay | Admitting: General Surgery

## 2010-09-27 ENCOUNTER — Telehealth (INDEPENDENT_AMBULATORY_CARE_PROVIDER_SITE_OTHER): Payer: Self-pay

## 2010-09-27 NOTE — Telephone Encounter (Signed)
LMOM with po appt 9-10. Pt to call with concerns.

## 2010-09-28 ENCOUNTER — Encounter: Payer: BC Managed Care – PPO | Admitting: Oncology

## 2010-09-28 NOTE — Op Note (Signed)
  NAMETELENA, [name]              ACCOUNT NO.:  [id number]  MEDICAL RECORD NO.:  [id number]  LOCATION:  DAYL                         FACILITY:  [facility]  PHYSICIAN:  Angelia Mould. Derrell Lolling, M.D.DATE OF BIRTH:  [birth year]  DATE OF PROCEDURE:  09/26/2010 DATE OF DISCHARGE:  09/26/2010                              OPERATIVE REPORT   PREOPERATIVE DIAGNOSIS:  Recurrent invasive cancer, right breast.  POSTOPERATIVE DIAGNOSIS:  Recurrent invasive cancer, right breast.  OPERATION PERFORMED:  Right partial mastectomy; excision of skin, subcutaneous tissue, and breast tissue, right breast.  SURGEON:  Angelia Mould. Derrell Lolling, M.D.  OPERATIVE INDICATIONS:  This is a 50 year old [place] American female who underwent right total mastectomy for invasive cancer over 1 year ago.  She had adjuvant chemotherapy.  She had reconstruction with implant in the subpectoral location.  She recently presented with a small nodule in the mastectomy incision.  This area was conservatively biopsied and showed invasive cancer and the margins were positive.  She has had staging workup and there is no evidence of any metastatic disease.  Her care has been discussed with me, Dr. Drue Second, and Dr. Chipper Herb.  Our plan is to re-excise this area to try to achieve negative margins and to then offer adjuvant radiation therapy.  OPERATIVE TECHNIQUE:  Following the induction of general endotracheal anesthesia, the patient's right breast was prepped and draped in the sterile fashion.  Surgical time-out was held, identifying correct patient, correct procedure, and correct site.  Intravenous antibiotics were given.  I injected 0.5% Marcaine with epinephrine into the superficial subcutaneous tissue.  I was very careful about this because of the implant in the deeper tissues.  I used a marking pen and marked a 6 cm transversely x 2 cm vertically incision to go widely around the biopsy site.  A knife was used to incise the skin  and dermis.  I then did the rest of the case with the cautery and excised all the way down into the very deep tissues, but I did not enter the capsule of the implant, although we probably came close to that.  The specimen was removed.  I marked the superior margin with a short suture and the lateral margin with a long suture and sent the specimen to the lab for routine histology.  I undermined the deep tissues a little bit.  Closure had a little bit of tension centrally.  I chose to close the deep subcutaneous tissues with interrupted sutures of 3-0 Vicryl. I closed the skin with interrupted sutures of 4-0 nylon.  Clean bandages were placed.  The patient was taken to the recovery room in stable condition.  Estimated blood loss was about 10 cc.  Complications none. Sponge, needle, and instrument counts were correct.     Angelia Mould. Derrell Lolling, M.D.     HMI/MEDQ  D:  09/26/2010  T:  09/27/2010  Job:  [id number]  cc:   [name], M.D. Fax: [phone]  Drue Second, M.D.  Electronically Signed by Claud Kelp M.D. on 09/28/2010 02:51:15 PM

## 2010-10-01 ENCOUNTER — Telehealth (INDEPENDENT_AMBULATORY_CARE_PROVIDER_SITE_OTHER): Payer: Self-pay

## 2010-10-01 NOTE — Telephone Encounter (Signed)
Per Dr Aura Camps request pt notified of path result and po appt made.

## 2010-10-11 ENCOUNTER — Ambulatory Visit (INDEPENDENT_AMBULATORY_CARE_PROVIDER_SITE_OTHER): Payer: BC Managed Care – PPO | Admitting: General Surgery

## 2010-10-11 ENCOUNTER — Encounter (INDEPENDENT_AMBULATORY_CARE_PROVIDER_SITE_OTHER): Payer: Self-pay | Admitting: General Surgery

## 2010-10-11 VITALS — BP 132/98 | HR 62

## 2010-10-11 DIAGNOSIS — Z9889 Other specified postprocedural states: Secondary | ICD-10-CM

## 2010-10-11 NOTE — Progress Notes (Signed)
Subjective:     Patient ID: [name], female   DOB: 08-04-1960, 50 y.o.   MRN: [id number]  HPI This patient recently underwent reexcision of a skin recurrence in her right mastectomy scar. The reexcision did show residual invasive carcinoma, but this time we got widely around the tumor and all of the margins are negative. She's had no problems with wound healing.  She has had an extensive staging workup with Dr. Park Breed, and a CT of the chest and PET/CT are all negative. Mammograms and MRI of the breast had to be canceled because of insurance reasons.  She has seen Dr. Chipper Herb and he plans to begin adjuvant radiation therapy to the right chest wall in the near future.  Review of Systems     Objective:   Physical Exam Patient looks well and is in no distress. Spirits are better  Right mastectomy wound with implant and recent scar looked good. I removed all the sutures. No sign of infection.    Assessment:     Recurrent cancer in the skin of her right mastectomy scar, now completely excised with negative margins.  No radiographic or clinical evidence of distant metastatic disease.  History of multicentric invasive ductal carcinoma, pathologic stage T2, N0, receptor positive, HER-2-negative, Ki-67 11%. Status post right total mastectomy with sentinel biopsy and insertion of tissue expander. Date of surgery July 21, 2009.    Plan:     Proceed with radiation therapy under the guidance of Dr. Chipper Herb.  Return to see me for physical exam in 5 months.  The next mammogram of her left breast should be in March of 2013.

## 2010-10-11 NOTE — Patient Instructions (Signed)
The incision on your right mastectomy scar looks good. All of the sutures were removed today. I gave you a copy of the pathology report, which shows that we had completely removed the cancer spot on the skin and the margins are negative. I agree with radiation therapy. It looks like her next mammogram of left breast should be in March of 2013. I will see you back in 5 months.

## 2010-10-15 ENCOUNTER — Encounter (INDEPENDENT_AMBULATORY_CARE_PROVIDER_SITE_OTHER): Payer: BC Managed Care – PPO | Admitting: General Surgery

## 2010-10-24 LAB — POCT URINALYSIS DIP (DEVICE)
Hgb urine dipstick: NEGATIVE
Ketones, ur: NEGATIVE
Protein, ur: NEGATIVE
pH: 7

## 2010-10-30 ENCOUNTER — Other Ambulatory Visit: Payer: Self-pay | Admitting: Oncology

## 2010-10-30 ENCOUNTER — Encounter (HOSPITAL_BASED_OUTPATIENT_CLINIC_OR_DEPARTMENT_OTHER): Payer: BC Managed Care – PPO | Admitting: Oncology

## 2010-10-30 DIAGNOSIS — C50319 Malignant neoplasm of lower-inner quadrant of unspecified female breast: Secondary | ICD-10-CM

## 2010-10-30 DIAGNOSIS — D693 Immune thrombocytopenic purpura: Secondary | ICD-10-CM

## 2010-10-30 DIAGNOSIS — Z9010 Acquired absence of unspecified breast and nipple: Secondary | ICD-10-CM

## 2010-10-30 DIAGNOSIS — D696 Thrombocytopenia, unspecified: Secondary | ICD-10-CM

## 2010-10-30 DIAGNOSIS — I10 Essential (primary) hypertension: Secondary | ICD-10-CM

## 2010-10-30 DIAGNOSIS — C50919 Malignant neoplasm of unspecified site of unspecified female breast: Secondary | ICD-10-CM

## 2010-10-30 LAB — CBC WITH DIFFERENTIAL/PLATELET
BASO%: 0.5 % (ref 0.0–2.0)
Basophils Absolute: 0 10*3/uL (ref 0.0–0.1)
EOS%: 1.2 % (ref 0.0–7.0)
HCT: 37.8 % (ref 34.8–46.6)
LYMPH%: 37.8 % (ref 14.0–49.7)
MCH: 24.7 pg — ABNORMAL LOW (ref 25.1–34.0)
MCHC: 32.8 g/dL (ref 31.5–36.0)
MCV: 75.1 fL — ABNORMAL LOW (ref 79.5–101.0)
MONO%: 6.4 % (ref 0.0–14.0)
NEUT%: 54.1 % (ref 38.4–76.8)
Platelets: 98 10*3/uL — ABNORMAL LOW (ref 145–400)
lymph#: 2.4 10*3/uL (ref 0.9–3.3)

## 2010-10-31 LAB — COMPREHENSIVE METABOLIC PANEL
ALT: 33 U/L (ref 0–35)
AST: 30 U/L (ref 0–37)
Alkaline Phosphatase: 49 U/L (ref 39–117)
CO2: 26 mEq/L (ref 19–32)
Creatinine, Ser: 0.89 mg/dL (ref 0.50–1.10)
Sodium: 140 mEq/L (ref 135–145)
Total Bilirubin: 0.3 mg/dL (ref 0.3–1.2)
Total Protein: 6.5 g/dL (ref 6.0–8.3)

## 2010-10-31 LAB — VITAMIN D 25 HYDROXY (VIT D DEFICIENCY, FRACTURES): Vit D, 25-Hydroxy: 25 ng/mL — ABNORMAL LOW (ref 30–89)

## 2010-11-09 ENCOUNTER — Ambulatory Visit (INDEPENDENT_AMBULATORY_CARE_PROVIDER_SITE_OTHER): Payer: BC Managed Care – PPO | Admitting: Family Medicine

## 2010-11-09 ENCOUNTER — Encounter: Payer: Self-pay | Admitting: Family Medicine

## 2010-11-09 VITALS — BP 166/104 | HR 81 | Temp 98.0°F | Wt 218.4 lb

## 2010-11-09 DIAGNOSIS — R7303 Prediabetes: Secondary | ICD-10-CM

## 2010-11-09 DIAGNOSIS — I10 Essential (primary) hypertension: Secondary | ICD-10-CM

## 2010-11-09 DIAGNOSIS — R7309 Other abnormal glucose: Secondary | ICD-10-CM | POA: Insufficient documentation

## 2010-11-09 DIAGNOSIS — Z23 Encounter for immunization: Secondary | ICD-10-CM

## 2010-11-09 MED ORDER — CARVEDILOL 25 MG PO TABS: 25.0000 mg | ORAL_TABLET | Freq: Two times a day (BID) | ORAL | Status: DC

## 2010-11-09 NOTE — Assessment & Plan Note (Signed)
>>  ASSESSMENT AND PLAN FOR DM (DIABETES MELLITUS) WITH COMPLICATIONS (HCC) WRITTEN ON 11/09/2010 11:41 AM BY SPIEGEL, RACHEL L, MD  Patient states that she's been trying to watch her food intake. She would like to be referred to nutrition counseling. Will place an order today.

## 2010-11-09 NOTE — Progress Notes (Signed)
  Subjective:    Patient ID: [name], female    DOB: [birth year], 50 y.o.   MRN: [id number]  HPI  Patient presents for followup of her hypertension and prediabetes. Hypertension: Patient states that her surgeon has been very concerned about her hypertension. She is currently taking HCTZ 25 mg every day and Coreg 12.5 mg twice a day everyday. She denies any headaches, shortness of breath, or chest pain.  Prediabetes: Patient has been tried to watch her diet. She would like counseling for nutrition. She has a strong family history of diabetes.   The patient states that she is currently getting radiation for breast cancer, and this will end November 1. Review of Systems Denies CP, SOB, HA, N/V/D, fever     Objective:   Physical Exam Vital signs reviewed General appearance - alert, well appearing, and in no distress and oriented to person, place, and time Heart - normal rate, regular rhythm, normal S1, S2, no murmurs, rubs, clicks or gallops Chest - clear to auscultation, no wheezes, rales or rhonchi, symmetric air entry, no tachypnea, retractions or cyanosis        Assessment & Plan:  ESSENTIAL HYPERTENSION BP: 166/104 mmHg  Patient's blood pressure is elevated today. On repeat check her blood pressure was 150/95. We will increase her Coreg to 25 mg twice a day. I will see her back in one week. At that time we may consider adding an Ace.  Prediabetes Patient states that she's been trying to watch her food intake. She would like to be referred to nutrition counseling. Will place an order today.

## 2010-11-09 NOTE — Assessment & Plan Note (Signed)
Patient states that she's been trying to watch her food intake. She would like to be referred to nutrition counseling. Will place an order today.

## 2010-11-09 NOTE — Patient Instructions (Signed)
It was nice to see you today. I have increased her Coreg to 25 mg twice a day. Please come back and see me next Friday morning. I will put in an order for diabetes education for you. Congratulations for being close to the end of your cancer treatment.

## 2010-11-09 NOTE — Assessment & Plan Note (Signed)
BP: 166/104 mmHg  Patient's blood pressure is elevated today. On repeat check her blood pressure was 150/95. We will increase her Coreg to 25 mg twice a day. I will see her back in one week. At that time we may consider adding an Ace.

## 2010-11-16 ENCOUNTER — Encounter: Payer: Self-pay | Admitting: Family Medicine

## 2010-11-16 ENCOUNTER — Ambulatory Visit (INDEPENDENT_AMBULATORY_CARE_PROVIDER_SITE_OTHER): Payer: BC Managed Care – PPO | Admitting: Family Medicine

## 2010-11-16 VITALS — BP 137/79 | HR 89 | Temp 97.5°F | Ht 63.0 in | Wt 219.0 lb

## 2010-11-16 DIAGNOSIS — M79672 Pain in left foot: Secondary | ICD-10-CM | POA: Insufficient documentation

## 2010-11-16 DIAGNOSIS — M79609 Pain in unspecified limb: Secondary | ICD-10-CM

## 2010-11-16 NOTE — Patient Instructions (Addendum)
I am sorry that your foot hurts.  I would like you to use crutches for now to stay off that foot. You can take Tylenol for pain. I would like you to see me in one week to recheck.   For your blood pressure, right now is under good control. Every once in a while, please check your blood pressure at home. If your blood pressure is above 140/90 on more than one check, please give [place] a call for another appointment.

## 2010-11-16 NOTE — Assessment & Plan Note (Signed)
Left foot pain of unclear etiology. I recommended an x-ray since she is at increased risk of stress fracture. Patient did not want to get an x-ray. I placed patient on crutches so she would be nonweightbearing over the weekend. Advised Tylenol or Motrin as well as ice and rest for her foot. I asked her to return next week for recheck. I plan to advise x-rays again if patient's foot still hurts.

## 2010-11-16 NOTE — Progress Notes (Signed)
  Subjective:    Patient ID: [name], female    DOB: 1960-06-05, 49 y.o.   MRN: [id number]  HPI Patient presents today for followup of her blood pressure. She would also like to be evaluated for foot pain. Hypertension-patient has been taking her increased dose of Coreg with no issues. She does not check her blood pressure at home. She denies headache shortness of breath or chest pain. Foot pain-patient woke up this morning with foot pain of the left foot. She denies any overuse or injury. She states this woke her up from sleep and she cannot put her full weight on it. She denies swelling but states that the midfoot is very tender to the touch and the bottom of the foot is tender at the heel. Her toes and ankle feel fine. She has never had symptoms like this.   Review of Systems Denies CP, SOB, HA, N/V/D, fever     Objective:   Physical Exam Vital signs reviewed General appearance - alert, well appearing, and in no distress and oriented to person, place, and time Heart - normal rate, regular rhythm, normal S1, S2, no murmurs, rubs, clicks or gallops Chest - clear to auscultation, no wheezes, rales or rhonchi, symmetric air entry, no tachypnea, retractions or cyanosis EXT-left foot was compared with the right foot. There is no swelling or heat. Patient is very tender to touch in the midfoot and arch to the base of the heel. She is also tender over the lateral malleolus. There is no swelling or bruising. She has full range of motion. There is no particular area of point tenderness.       Assessment & Plan:  Left foot pain Left foot pain of unclear etiology. I recommended an x-ray since she is at increased risk of stress fracture. Patient did not want to get an x-ray. I placed patient on crutches so she would be nonweightbearing over the weekend. Advised Tylenol or Motrin as well as ice and rest for her foot. I asked her to return next week for recheck. I plan to advise x-rays again if  patient's foot still hurts.

## 2010-11-17 ENCOUNTER — Telehealth: Payer: Self-pay | Admitting: Family Medicine

## 2010-11-17 ENCOUNTER — Inpatient Hospital Stay (INDEPENDENT_AMBULATORY_CARE_PROVIDER_SITE_OTHER)
Admission: RE | Admit: 2010-11-17 | Discharge: 2010-11-17 | Disposition: A | Discharge status: Home / Self Care | Payer: BC Managed Care – PPO | Source: Ambulatory Visit | Attending: Family Medicine | Admitting: Family Medicine

## 2010-11-17 ENCOUNTER — Ambulatory Visit (HOSPITAL_COMMUNITY)
Admission: RE | Admit: 2010-11-17 | Discharge: 2010-11-17 | Disposition: A | Discharge status: Home / Self Care | Payer: BC Managed Care – PPO | Source: Ambulatory Visit | Attending: Family Medicine | Admitting: Family Medicine

## 2010-11-17 DIAGNOSIS — M79609 Pain in unspecified limb: Secondary | ICD-10-CM | POA: Insufficient documentation

## 2010-11-17 DIAGNOSIS — M109 Gout, unspecified: Secondary | ICD-10-CM

## 2010-11-17 NOTE — Telephone Encounter (Signed)
Called asking for pain medication this morning due to foot pain.  I stated that it is against our practice policy to rx pain medications over the phone.  She will go to urgent care if unable to tolerate the pain until Monday.

## 2010-11-30 ENCOUNTER — Ambulatory Visit: Payer: BC Managed Care – PPO | Admitting: *Deleted

## 2010-12-25 ENCOUNTER — Telehealth: Payer: Self-pay | Admitting: *Deleted

## 2010-12-26 ENCOUNTER — Ambulatory Visit: Payer: BC Managed Care – PPO | Admitting: Radiation Oncology

## 2010-12-26 ENCOUNTER — Ambulatory Visit
Admission: RE | Admit: 2010-12-26 | Discharge: 2010-12-26 | Disposition: A | Discharge status: Home / Self Care | Payer: BC Managed Care – PPO | Source: Ambulatory Visit | Attending: Radiation Oncology | Admitting: Radiation Oncology

## 2010-12-26 VITALS — BP 131/68 | HR 82 | Temp 97.7°F | Resp 18 | Wt 223.6 lb

## 2010-12-26 DIAGNOSIS — R232 Flushing: Secondary | ICD-10-CM | POA: Insufficient documentation

## 2010-12-26 DIAGNOSIS — E119 Type 2 diabetes mellitus without complications: Secondary | ICD-10-CM | POA: Insufficient documentation

## 2010-12-26 DIAGNOSIS — L538 Other specified erythematous conditions: Secondary | ICD-10-CM | POA: Insufficient documentation

## 2010-12-26 DIAGNOSIS — Z79899 Other long term (current) drug therapy: Secondary | ICD-10-CM | POA: Insufficient documentation

## 2010-12-26 DIAGNOSIS — C50319 Malignant neoplasm of lower-inner quadrant of unspecified female breast: Secondary | ICD-10-CM | POA: Insufficient documentation

## 2010-12-26 DIAGNOSIS — I10 Essential (primary) hypertension: Secondary | ICD-10-CM | POA: Insufficient documentation

## 2010-12-26 DIAGNOSIS — Z510 Encounter for antineoplastic radiation therapy: Secondary | ICD-10-CM | POA: Insufficient documentation

## 2010-12-26 DIAGNOSIS — C50911 Malignant neoplasm of unspecified site of right female breast: Secondary | ICD-10-CM

## 2010-12-26 NOTE — Progress Notes (Signed)
Noted dry desquamation on large portion of breast.  No c/o

## 2010-12-31 NOTE — Progress Notes (Signed)
CC:   Drue Second, M.D. Angelia Mould. Derrell Lolling, M.D. Etter Sjogren, M.D. [facility]  Ms. 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  She is without complaints today.  She continues with her tamoxifen under the direction of Dr. Welton Flakes.  She sees Dr. Derrell Lolling in January, Dr. Odis Luster next week, and Dr. Welton Flakes again on December 1.  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  PHYSICAL EXAMINATION:  Nodes:  Without palpable cervical, supraclavicular, or axillary lymphadenopathy.  Chest:  Lungs clear. Breasts:  There is marked hyperpigmentation of the skin along the right breast with patchy dry desquamation.  No visible or palpable evidence for recurrent disease along her right chest wall/reconstructed breast.  IMPRESSION:  Satisfactory progress.  PLAN:  She will maintain her followup with Dr. Welton Flakes, Dr. Derrell Lolling, and Dr. Odis Luster.    ______________________________ [name], M.D. RJM/MEDQ  D:  12/26/2010  T:  12/26/2010  Job:  [id number]

## 2011-01-01 ENCOUNTER — Ambulatory Visit: Payer: BC Managed Care – PPO | Admitting: Radiation Oncology

## 2011-01-07 ENCOUNTER — Ambulatory Visit (HOSPITAL_BASED_OUTPATIENT_CLINIC_OR_DEPARTMENT_OTHER): Payer: BC Managed Care – PPO | Admitting: Oncology

## 2011-01-07 ENCOUNTER — Other Ambulatory Visit: Payer: Self-pay | Admitting: Oncology

## 2011-01-07 ENCOUNTER — Encounter: Payer: Self-pay | Admitting: Oncology

## 2011-01-07 ENCOUNTER — Telehealth: Payer: Self-pay | Admitting: *Deleted

## 2011-01-07 ENCOUNTER — Other Ambulatory Visit (HOSPITAL_BASED_OUTPATIENT_CLINIC_OR_DEPARTMENT_OTHER): Payer: BC Managed Care – PPO | Admitting: Lab

## 2011-01-07 VITALS — BP 134/80 | HR 84 | Temp 98.6°F | Ht 65.0 in | Wt 221.3 lb

## 2011-01-07 DIAGNOSIS — Z9010 Acquired absence of unspecified breast and nipple: Secondary | ICD-10-CM

## 2011-01-07 DIAGNOSIS — D693 Immune thrombocytopenic purpura: Secondary | ICD-10-CM

## 2011-01-07 DIAGNOSIS — C50319 Malignant neoplasm of lower-inner quadrant of unspecified female breast: Secondary | ICD-10-CM

## 2011-01-07 DIAGNOSIS — C50919 Malignant neoplasm of unspecified site of unspecified female breast: Secondary | ICD-10-CM

## 2011-01-07 DIAGNOSIS — Z170 Estrogen receptor positive status [ER+]: Secondary | ICD-10-CM

## 2011-01-07 DIAGNOSIS — C44509 Unspecified malignant neoplasm of skin of other part of trunk: Secondary | ICD-10-CM

## 2011-01-07 DIAGNOSIS — D6949 Other primary thrombocytopenia: Secondary | ICD-10-CM

## 2011-01-07 DIAGNOSIS — I10 Essential (primary) hypertension: Secondary | ICD-10-CM

## 2011-01-07 DIAGNOSIS — C44599 Other specified malignant neoplasm of skin of other part of trunk: Secondary | ICD-10-CM

## 2011-01-07 LAB — COMPREHENSIVE METABOLIC PANEL
AST: 39 U/L — ABNORMAL HIGH (ref 0–37)
Albumin: 3.7 g/dL (ref 3.5–5.2)
Alkaline Phosphatase: 45 U/L (ref 39–117)
BUN: 11 mg/dL (ref 6–23)
Calcium: 9.2 mg/dL (ref 8.4–10.5)
Creatinine, Ser: 0.83 mg/dL (ref 0.50–1.10)
Glucose, Bld: 125 mg/dL — ABNORMAL HIGH (ref 70–99)

## 2011-01-07 LAB — CBC WITH DIFFERENTIAL/PLATELET
BASO%: 0.4 % (ref 0.0–2.0)
Basophils Absolute: 0 10*3/uL (ref 0.0–0.1)
EOS%: 1.7 % (ref 0.0–7.0)
HCT: 37.9 % (ref 34.8–46.6)
HGB: 12.5 g/dL (ref 11.6–15.9)
LYMPH%: 27.9 % (ref 14.0–49.7)
MCH: 25.3 pg (ref 25.1–34.0)
MCHC: 33 g/dL (ref 31.5–36.0)
MCV: 76.6 fL — ABNORMAL LOW (ref 79.5–101.0)
NEUT%: 62 % (ref 38.4–76.8)
Platelets: 101 10*3/uL — ABNORMAL LOW (ref 145–400)

## 2011-01-07 NOTE — Telephone Encounter (Signed)
gave patient appointment for 07-2011 printed out calendar and gave to the patient 

## 2011-01-07 NOTE — Telephone Encounter (Signed)
gave patient appointment for 06-07-2011 printed out calendar and gave to the patient

## 2011-01-07 NOTE — Patient Instructions (Addendum)
Ergocalciferol, Vitamin D2 tablets or capsules What is this medicine? ERGOCALCIFEROL (er goe kal SIF e role) is a man made form of vitamin D. It helps your body keep the right amount of calcium and phosphorus for healthy bones and teeth. This medicine may be used for other purposes; ask your health care provider or pharmacist if you have questions. What should I tell my health care provider before I take this medicine? They need to know if you have any of the following conditions: -kidney disease -liver disease -other chronic disease -parathyroid disease -stomach disease -an unusual or allergic reaction to vitamin D, other medicines, foods, dyes, or preservatives -pregnant or trying to get pregnant -breast-feeding How should I use this medicine? Take this medicine by mouth with a glass of water. Follow the directions on the prescription label. Take your medicine at regular intervals. Do not take your medicine more often than directed. Talk to your pediatrician regarding the use of this medicine in children. While this drug may be prescribed for children for selected conditions, precautions do apply. Overdosage: If you think you have taken too much of this medicine contact a poison control center or emergency room at once. NOTE: This medicine is only for you. Do not share this medicine with others. What if I miss a dose? If you miss a dose, take it as soon as you can. If it is almost time for your next dose, take only that dose. Do not take double or extra doses. What may interact with this medicine? Do not take this medicine with any of the following medications: -vitamin D This medicine may also interact with the following medications: -digoxin -diuretics -medicines for cholesterol like colestipol or cholestyramine -medicines to treat seizures or nerve pain -mineral oil -orlistat -some over-the-counter supplements This list may not describe all possible interactions. Give your health  care provider a list of all the medicines, herbs, non-prescription drugs, or dietary supplements you use. Also tell them if you smoke, drink alcohol, or use illegal drugs. Some items may interact with your medicine. What should I watch for while using this medicine? Visit your doctor or health care professional for regular checks on your progress. Do not take any non-prescription medicines that have vitamin D, phosphorus, magnesium, or calcium including antacids while taking this medicine, unless your doctor or health care professional says you can. The extra supplements can cause side effects. What side effects may I notice from receiving this medicine? Side effects that you should report to your doctor or health care professional as soon as possible: -allergic reactions like skin rash, itching or hives, swelling of the face, lips, or tongue -bone pain -increased thirst -increased urination (especially at night) -irregular heartbeat, high blood pressure -seizures -unexpected weight loss -unusually weak or tired Side effects that usually do not require medical attention (report to your doctor or health care professional if they continue or are bothersome): -constipation -dry mouth -headache -loss of appetite -metallic taste -stomach upset This list may not describe all possible side effects. Call your doctor for medical advice about side effects. You may report side effects to FDA at 1-800-FDA-1088. Where should I keep my medicine? Keep out of the reach of children. Store at room temperature between 15 and 30 degrees C (59 and 86 degrees F). Protect from light. Keep container tightly closed. Throw away any unused medicine after the expiration date. NOTE: This sheet is a summary. It may not cover all possible information. If you have questions about this  medicine, talk to your doctor, pharmacist, or health care provider.  2012, Elsevier/Gold Standard. (05/25/2007 5:48:44 PM)Tamoxifen oral  tablet What is this medicine? TAMOXIFEN (ta MOX i fen) blocks the effects of estrogen. It is commonly used to treat breast cancer. It is also used to decrease the chance of breast cancer coming back in women who have received treatment for the disease. It may also help prevent breast cancer in women who have a high risk of developing breast cancer. This medicine may be used for other purposes; ask your health care provider or pharmacist if you have questions. What should I tell my health care provider before I take this medicine? They need to know if you have any of these conditions: -blood clots -blood disease -cataracts or impaired eyesight -endometriosis -high calcium levels -high cholesterol -irregular menstrual cycles -liver disease -stroke -uterine fibroids -an unusual or allergic reaction to tamoxifen, other medicines, foods, dyes, or preservatives -pregnant or trying to get pregnant -breast-feeding How should I use this medicine? Take this medicine by mouth with a glass of water. Follow the directions on the prescription label. You can take it with or without food. Take your medicine at regular intervals. Do not take your medicine more often than directed. Do not stop taking except on your doctor's advice. A special MedGuide will be given to you by the pharmacist with each prescription and refill. Be sure to read this information carefully each time. Talk to your pediatrician regarding the use of this medicine in children. While this drug may be prescribed for selected conditions, precautions do apply. Overdosage: If you think you have taken too much of this medicine contact a poison control center or emergency room at once. NOTE: This medicine is only for you. Do not share this medicine with others. What if I miss a dose? If you miss a dose, take it as soon as you can. If it is almost time for your next dose, take only that dose. Do not take double or extra doses. What may  interact with this medicine? -aminoglutethimide -bromocriptine -chemotherapy drugs -female hormones, like estrogens and birth control pills -letrozole -medroxyprogesterone -phenobarbital -rifampin -warfarin This list may not describe all possible interactions. Give your health care provider a list of all the medicines, herbs, non-prescription drugs, or dietary supplements you use. Also tell them if you smoke, drink alcohol, or use illegal drugs. Some items may interact with your medicine. What should I watch for while using this medicine? Visit your doctor or health care professional for regular checks on your progress. You will need regular pelvic exams, breast exams, and mammograms. If you are taking this medicine to reduce your risk of getting breast cancer, you should know that this medicine does not prevent all types of breast cancer. If breast cancer or other problems occur, there is no guarantee that it will be found at an early stage. Do not become pregnant while taking this medicine or for 2 months after stopping this medicine. Stop taking this medicine if you get pregnant or think you are pregnant and contact your doctor. This medicine may harm your unborn baby. Women who can possibly become pregnant should use birth control methods that do not use hormones during tamoxifen treatment and for 2 months after therapy has stopped. Talk with your health care provider for birth control advice. Do not breast feed while taking this medicine. What side effects may I notice from receiving this medicine? Side effects that you should report to your doctor or  health care professional as soon as possible: -changes in vision (blurred vision) -changes in your menstrual cycle -difficulty breathing or shortness of breath -difficulty walking or talking -new breast lumps -numbness -pelvic pain or pressure -redness, blistering, peeling or loosening of the skin, including inside the mouth -skin rash or  itching (hives) -sudden chest pain -swelling of lips, face, or tongue -swelling, pain or tenderness in your calf or leg -unusual bruising or bleeding -vaginal discharge that is bloody, brown, or rust -weakness -yellowing of the whites of the eyes or skin Side effects that usually do not require medical attention (report to your doctor or health care professional if they continue or are bothersome): -fatigue -hair loss, although uncommon and is usually mild -headache -hot flashes -impotence (in men) -nausea, vomiting (mild) -vaginal discharge (white or clear) This list may not describe all possible side effects. Call your doctor for medical advice about side effects. You may report side effects to FDA at 1-800-FDA-1088. Where should I keep my medicine? Keep out of the reach of children. Store at room temperature between 20 and 25 degrees C (68 and 77 degrees F). Protect from light. Keep container tightly closed. Throw away any unused medicine after the expiration date. NOTE: This sheet is a summary. It may not cover all possible information. If you have questions about this medicine, talk to your doctor, pharmacist, or health care provider.  2012, Elsevier/Gold Standard. (10/08/2007 12:01:56 PM)

## 2011-01-07 NOTE — Progress Notes (Signed)
OFFICE PROGRESS NOTE    Danielle Plunk, MD [address] [place] [address]  DIAGNOSIS: 50 year old female with invasive ductal carcinoma of the right breast originally diagnosed in June 2011. With subsequent subcutaneous recurrence  PRIOR THERAPY:  #1 patient initially underwent a right mastectomy in June 2011 with the final pathology revealing 2 foci of invasive ductal carcinoma, comment 1 measuring 2.2 cm and a second one measuring 0.5 cm. The tumor was ER positive PR positive HER-2/neu negative with a proliferation marker 11%.  #2 the patient had Oncotype DX testing done that revealed recurrence score of 27 with 18% her risk of distant recurrence with tamoxifen alone.  #3 patient had adjuvant chemotherapy consisting of Taxotere and Cytoxan every 3 weeks for a total of 4 cycles from 10/23/2009 2 12/25/2009.  #4 patient had reconstruction performed by Dr. Etter Sjogren.  #5 she was then begun on tamoxifen 20 mg daily in January 2012. Number  #6 patient then developed a subcutaneous nodule at the site of her original mastectomy scar. This was resected. She then went on to have radiation therapy locally to this to the mastectomy site.  #7 patient continues on tamoxifen 20 mg daily.  CURRENT THERAPY: Tamoxifen 20 mg daily  INTERVAL HISTORY: [name] 50 y.o. female returns for followup visit today. Her last visit with me was back in September 2012. Overall she is doing well. She is tolerating tamoxifen quite well without any evidence of DVTs vaginal bleeding no blurring of vision or any visual changes. Patient however is experiencing hot flashes which is not so bad for her at this time. She has no nausea no vomiting no fevers no chills no hematuria hematochezia melena hemoptysis or hematemesis. Remainder of the 10 point review of systems is negative.  MEDICAL HISTORY: Past Medical History  Diagnosis Date  . Breast ca   . Hypertension   . Hyperlipidemia   .  Thrombocytopenia   . Diabetes mellitus     ALLERGIES:   has no known allergies.  MEDICATIONS:  Current Outpatient Prescriptions  Medication Sig Dispense Refill  . acetaminophen (TYLENOL) 500 MG tablet Take 500 mg by mouth as needed.       . carvedilol (COREG) 25 MG tablet Take 1 tablet (25 mg total) by mouth 2 (two) times daily with a meal.  60 tablet  3  . cholecalciferol (VITAMIN D) 1000 UNITS tablet Take 2,000 Units by mouth daily.        . hydrochlorothiazide 25 MG tablet Take 25 mg by mouth daily.        . tamoxifen (NOLVADEX) 20 MG tablet Take 20 mg by mouth daily.        Marland Kitchen venlafaxine (EFFEXOR-XR) 37.5 MG 24 hr capsule Take 37.5 mg by mouth daily.        Marland Kitchen CALCIUM PO Take by mouth 2 (two) times daily. To take it bid for five days, then daily thereafter.       . Hydrocodone-Acetaminophen (VICODIN PO) Take by mouth as needed.        . pravastatin (PRAVACHOL) 40 MG tablet Take 40 mg by mouth daily.          SURGICAL HISTORY:  Past Surgical History  Procedure Date  . Breast surgery   . Thyroidectomy     REVIEW OF SYSTEMS:  Pertinent items are noted in HPI.   PHYSICAL EXAMINATION: General appearance: alert, cooperative and appears stated age Head: Normocephalic, without obvious abnormality, atraumatic Neck: no adenopathy, no carotid bruit,  no JVD, supple, symmetrical, trachea midline and thyroid not enlarged, symmetric, no tenderness/mass/nodules Lymph nodes: Cervical, supraclavicular, and axillary nodes normal. Resp: clear to auscultation bilaterally and normal percussion bilaterally Back: symmetric, no curvature. ROM normal. No CVA tenderness. GI: soft, non-tender; bowel sounds normal; no masses,  no organomegaly Extremities: extremities normal, atraumatic, no cyanosis or edema Neurologic: Alert and oriented X 3, normal strength and tone. Normal symmetric reflexes. Normal coordination and gait Left breast no masses or nipple discharge no skin changes. Right mastectomy scar  looks well healed there is changes from radiation otherwise no masses no nodularity. ECOG PERFORMANCE STATUS: 1 - Symptomatic but completely ambulatory  Blood pressure 134/80, pulse 84, temperature 98.6 F (37 C), temperature source Oral, height 5\' 5"  (1.651 m), weight 221 lb 4.8 oz (100.381 kg).  LABORATORY DATA: Lab Results  Component Value Date   WBC 4.8 01/07/2011   HGB 12.5 01/07/2011   HCT 37.9 01/07/2011   MCV 76.6* 01/07/2011   PLT 101* 01/07/2011      Chemistry      Component Value Date/Time   NA 139 01/07/2011 1142   NA 139 01/07/2011 1142   K 3.4* 01/07/2011 1142   K 3.4* 01/07/2011 1142   CL 100 01/07/2011 1142   CL 100 01/07/2011 1142   CO2 29 01/07/2011 1142   CO2 29 01/07/2011 1142   BUN 11 01/07/2011 1142   BUN 11 01/07/2011 1142   CREATININE 0.83 01/07/2011 1142   CREATININE 0.83 01/07/2011 1142   CREATININE 0.72 06/11/2010 0847      Component Value Date/Time   CALCIUM 9.2 01/07/2011 1142   CALCIUM 9.2 01/07/2011 1142   ALKPHOS 45 01/07/2011 1142   ALKPHOS 45 01/07/2011 1142   AST 39* 01/07/2011 1142   AST 39* 01/07/2011 1142   ALT 42* 01/07/2011 1142   ALT 42* 01/07/2011 1142   BILITOT 0.3 01/07/2011 1142   BILITOT 0.3 01/07/2011 1142       RADIOGRAPHIC STUDIES:  No results found.  ASSESSMENT: 50 year old female with #1 right breast cancer status post mastectomy with subsequent local recurrence which was resected followed by radiation therapy.  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  #2 ITP observation only her blood count looks great today.   All questions were answered. The patient knows to call the clinic with any problems, questions or concerns. We can certainly see the patient much sooner if necessary.  I spent 25 minutes counseling the patient face to face. The total time spent in the appointment was 30  minutes.    Drue Second, MD Medical/Oncology [facility] [phone] (beeper) [phone] (Office)  01/07/2011, 6:47 PM

## 2011-01-09 ENCOUNTER — Ambulatory Visit: Payer: BC Managed Care – PPO

## 2011-01-16 ENCOUNTER — Ambulatory Visit (INDEPENDENT_AMBULATORY_CARE_PROVIDER_SITE_OTHER): Payer: BC Managed Care – PPO | Admitting: *Deleted

## 2011-01-16 DIAGNOSIS — Z111 Encounter for screening for respiratory tuberculosis: Secondary | ICD-10-CM

## 2011-01-18 ENCOUNTER — Ambulatory Visit (INDEPENDENT_AMBULATORY_CARE_PROVIDER_SITE_OTHER): Payer: BC Managed Care – PPO | Admitting: *Deleted

## 2011-01-18 DIAGNOSIS — Z111 Encounter for screening for respiratory tuberculosis: Secondary | ICD-10-CM

## 2011-01-18 DIAGNOSIS — IMO0001 Reserved for inherently not codable concepts without codable children: Secondary | ICD-10-CM

## 2011-01-18 LAB — TB SKIN TEST: TB Skin Test: NEGATIVE mm

## 2011-01-30 ENCOUNTER — Other Ambulatory Visit: Payer: Self-pay | Admitting: *Deleted

## 2011-01-30 DIAGNOSIS — D693 Immune thrombocytopenic purpura: Secondary | ICD-10-CM

## 2011-01-30 DIAGNOSIS — C50919 Malignant neoplasm of unspecified site of unspecified female breast: Secondary | ICD-10-CM

## 2011-01-30 DIAGNOSIS — D6949 Other primary thrombocytopenia: Secondary | ICD-10-CM

## 2011-01-30 MED ORDER — VENLAFAXINE HCL ER 37.5 MG PO CP24: 37.5000 mg | ORAL_CAPSULE | Freq: Every day | ORAL | Status: DC

## 2011-01-30 MED ORDER — TAMOXIFEN CITRATE 20 MG PO TABS: 20.0000 mg | ORAL_TABLET | Freq: Every day | ORAL | Status: DC

## 2011-01-31 ENCOUNTER — Telehealth: Payer: Self-pay | Admitting: Oncology

## 2011-01-31 NOTE — Telephone Encounter (Signed)
Patient received two prescription from [facility] on 01/30/11 $54.96,her remaning balance [facility] $294.71.

## 2011-02-01 ENCOUNTER — Ambulatory Visit (INDEPENDENT_AMBULATORY_CARE_PROVIDER_SITE_OTHER): Payer: Self-pay | Admitting: Family Medicine

## 2011-02-01 ENCOUNTER — Encounter: Payer: Self-pay | Admitting: Family Medicine

## 2011-02-01 VITALS — BP 144/83 | HR 73 | Temp 98.4°F | Ht 63.0 in | Wt 218.0 lb

## 2011-02-01 DIAGNOSIS — J069 Acute upper respiratory infection, unspecified: Secondary | ICD-10-CM

## 2011-02-01 MED ORDER — GUAIFENESIN-CODEINE 100-10 MG/5ML PO SYRP: 10.0000 mL | ORAL_SOLUTION | Freq: Two times a day (BID) | ORAL | Status: DC | PRN

## 2011-02-01 MED ORDER — AMOXICILLIN 500 MG PO CAPS: 500.0000 mg | ORAL_CAPSULE | Freq: Two times a day (BID) | ORAL | Status: AC

## 2011-02-01 NOTE — Progress Notes (Signed)
  Subjective:    Patient ID: [name], female    DOB: [birth year], 50 y.o.   MRN: [id number]  HPI #1. URI symptoms and cough: Patient has had nasal congestion, nasal drainage, and cough for the past 12-15 days. She states the cough is what bothers her the most and keeps her up at night. She denies any history of asthma, smoking, COPD diagnosis. Nasal congestion nasal drainage also prominent. No fevers or chills. No nausea or vomiting.   Review of Systems See HPI above for review of systems.       Objective:   Physical Exam Gen:  Alert, cooperative patient who appears stated age in no acute distress.  Vital signs reviewed. HEENT:  [place]/AT.  EOMI, PERRL. nasal turbinates swollen bilaterally with exudates noted. MMM, tonsils non-erythematous, non-edematous.  External ears WNL, Bilateral TM's normal without retraction, redness or bulging.  Heart: Grade 2/6 murmur noted bilaterally. Regular rate and rhythm Lungs clear to auscultation throughout no wheezing or rales       Assessment & Plan:

## 2011-02-01 NOTE — Patient Instructions (Signed)
Take the amoxicillin (antibiotic) twice a day for the next 7 days. Use the cough syrup 2 to 3 times a day and before bed at night to help with sleep. I hope you feel better and it was good to see her today. Happy New Years!

## 2011-02-01 NOTE — Assessment & Plan Note (Signed)
Due to the length of symptoms will treat with antibiotics. I would rather treat with azithromycin but the patient needs something on the $4 [facility] prescription list and therefore we will treat her with 7 days of amoxicillin. Guaifenesin with codeine for relief of cough. Recommended to return if symptoms worsening or not improving by next week.

## 2011-02-03 ENCOUNTER — Other Ambulatory Visit: Payer: Self-pay | Admitting: Family Medicine

## 2011-02-04 NOTE — Telephone Encounter (Signed)
Refill request

## 2011-02-05 HISTORY — PX: REDUCTION MAMMAPLASTY: SUR839

## 2011-02-06 NOTE — Telephone Encounter (Signed)
Refill request

## 2011-03-04 ENCOUNTER — Other Ambulatory Visit: Payer: Self-pay | Admitting: Family Medicine

## 2011-03-04 DIAGNOSIS — Z1231 Encounter for screening mammogram for malignant neoplasm of breast: Secondary | ICD-10-CM

## 2011-03-12 ENCOUNTER — Encounter (INDEPENDENT_AMBULATORY_CARE_PROVIDER_SITE_OTHER): Payer: Self-pay | Admitting: General Surgery

## 2011-03-12 ENCOUNTER — Ambulatory Visit (INDEPENDENT_AMBULATORY_CARE_PROVIDER_SITE_OTHER): Payer: Medicaid Other | Admitting: General Surgery

## 2011-03-12 VITALS — BP 136/88 | HR 72 | Temp 96.8°F | Resp 16 | Ht 63.0 in | Wt 214.0 lb

## 2011-03-12 DIAGNOSIS — Z853 Personal history of malignant neoplasm of breast: Secondary | ICD-10-CM

## 2011-03-12 NOTE — Patient Instructions (Signed)
Your physical exam today shows no evidence of cancer. Be sure to keep your appointment with Dr. Welton Flakes in May of 2013. Be sure to keep your appointment to get a left breast mammogram on April 12, 2011. Return to see me in one year, sooner if there are any new problems.

## 2011-03-12 NOTE — Progress Notes (Signed)
Patient ID: [name], female   DOB: 1960-10-15, 51 y.o.   MRN: [id number]  Chief Complaint  Patient presents with  . Follow-up    Excision of skin mass - right breast    HPI [name] is a 51 y.o. female. She returns to see me for long-term followup regarding her right breast cancer.  On July 21, 2009 she underwent insertion of Port-A-Cath, right total mastectomy and sentinel lymph node biopsy and placement of tissue expander by Dr. Odis Luster. 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 She had adjuvant chemotherapy, and was then placed on tamoxifen.  She developed a skin recurrence, and on September 26, 2010 she underwent right partial mastectomy and wide local excision by me. This showed recurrent invasive cancer the skin. She has healed and she had postop radiation therapy by Dr. Chipper Herb which was completed in November 2012.  She has no complaints about her breast skin on the right or her left breast. She has some hot flashes from the tamoxifen but is otherwise doing well. She is scheduled for mammograms in March. HPI  Past Medical History  Diagnosis Date  . Breast CA   . Hypertension   . Hyperlipidemia   . Thrombocytopenia   . Diabetes mellitus     Past Surgical History  Procedure Date  . Thyroidectomy   . Breast surgery 09/12/10    excision of skin mass - right breast    Family History  Problem Relation Age of Onset  . Heart disease Mother   . Stroke Father   . Heart disease Brother     Social History History  Substance Use Topics  . Smoking status: Never Smoker   . Smokeless tobacco: Never Used  . Alcohol Use: No    No Known Allergies  Current Outpatient Prescriptions  Medication Sig Dispense Refill  . acetaminophen (TYLENOL) 500 MG tablet Take 500 mg by mouth as needed.       Marland Kitchen CALCIUM PO Take 2,000 mg by mouth 2 (two) times daily. To take it bid for five  days, then daily thereafter.      . carvedilol (COREG) 25 MG tablet Take 1 tablet (25 mg total) by mouth 2 (two) times daily with a meal.  60 tablet  3  . cholecalciferol (VITAMIN D) 1000 UNITS tablet Take 2,000 Units by mouth daily.        Marland Kitchen guaiFENesin-codeine (ROBITUSSIN AC) 100-10 MG/5ML syrup Take 10 mLs by mouth 2 (two) times daily as needed for cough.  180 mL  0  . hydrochlorothiazide (HYDRODIURIL) 25 MG tablet TAKE ONE TABLET BY MOUTH EVERY DAY  30 tablet  1  . Hydrocodone-Acetaminophen (VICODIN PO) Take by mouth as needed.        . pravastatin (PRAVACHOL) 40 MG tablet Take 40 mg by mouth daily.        . tamoxifen (NOLVADEX) 20 MG tablet Take 1 tablet (20 mg total) by mouth daily.  30 tablet  4  . venlafaxine (EFFEXOR-XR) 37.5 MG 24 hr capsule Take 1 capsule (37.5 mg total) by mouth daily.  30 capsule  4    Review of Systems Review of Systems 12 system review of systems is performed and is negative except as described above. Blood pressure 136/88, pulse 72, temperature 96.8 F (36 C), temperature source Temporal, resp. rate 16, height 5\' 3"  (1.6 m), weight 214 lb (97.07 kg).  Physical Exam Physical  Exam Constitutional: Alert. Stable. In no dsitress. Eyes: Extraocular movements intact. Sclera clear Neck: No adenopathy. No mass.  Lungs: Clear to auscultation. Breasts;Right mastectomy healthy. No evidence for recurrence, no adenopathy.  Left breast normal, no skin change or palpable mass or axillary adenopathy.  Neurologic no gross motor or sensory deficits. Gait is normal.   Assessment    Multifocal invasive carcinoma right breast, status post right total mastectomy and sentinel node biopsy insertion of Port-A-Cath on July 21, 2009.  This is a invasive duct carcinoma, Ki-67 11%, HER-2-negative, receptor positive, pathologic stage T2, N0.  History skin recurrence right mastectomy wound, status post right partial mastectomy September 26, 2010. No evidence of recurrence following  surgery and postop radiation therapy   Plan    She will have a left breast mammogram on April 12, 2011.  I will defer indications for MRI of the breast to Dr. Park Breed.  She will see Dr. Park Breed in May of 2013.  I will see her back in one year, sooner if there is any need.       Devoiry Corriher M 03/12/2011, 10:58 AM

## 2011-03-25 ENCOUNTER — Encounter: Payer: Self-pay | Admitting: Oncology

## 2011-03-25 NOTE — Progress Notes (Signed)
Patient received one prescription from [facility] on 03/18/11 $14.16,her remaning balance [facility] $280.55and ALIGHT -0-.

## 2011-03-26 ENCOUNTER — Other Ambulatory Visit: Payer: Self-pay | Admitting: Family Medicine

## 2011-03-26 DIAGNOSIS — I10 Essential (primary) hypertension: Secondary | ICD-10-CM

## 2011-03-26 MED ORDER — CARVEDILOL 25 MG PO TABS: 25.0000 mg | ORAL_TABLET | Freq: Two times a day (BID) | ORAL | Status: DC

## 2011-03-29 NOTE — Telephone Encounter (Signed)
xxx

## 2011-04-02 ENCOUNTER — Encounter: Payer: Self-pay | Admitting: Oncology

## 2011-04-02 NOTE — Progress Notes (Signed)
Patient received one prescription from [facility] on 04/01/11 $40.80,her remaning balance [facility] $239.75and ALIGHT -0-.

## 2011-04-04 ENCOUNTER — Encounter: Payer: Self-pay | Admitting: Family Medicine

## 2011-04-04 ENCOUNTER — Ambulatory Visit (INDEPENDENT_AMBULATORY_CARE_PROVIDER_SITE_OTHER): Payer: PRIVATE HEALTH INSURANCE | Admitting: Family Medicine

## 2011-04-04 DIAGNOSIS — I10 Essential (primary) hypertension: Secondary | ICD-10-CM

## 2011-04-04 DIAGNOSIS — R600 Localized edema: Secondary | ICD-10-CM | POA: Insufficient documentation

## 2011-04-04 DIAGNOSIS — R609 Edema, unspecified: Secondary | ICD-10-CM

## 2011-04-04 DIAGNOSIS — R7303 Prediabetes: Secondary | ICD-10-CM

## 2011-04-04 DIAGNOSIS — E785 Hyperlipidemia, unspecified: Secondary | ICD-10-CM

## 2011-04-04 DIAGNOSIS — R7309 Other abnormal glucose: Secondary | ICD-10-CM

## 2011-04-04 LAB — LIPID PANEL
HDL: 63 mg/dL (ref >39)
LDL Cholesterol: 112 mg/dL — ABNORMAL HIGH (ref 0–99)
Triglycerides: 175 mg/dL — ABNORMAL HIGH (ref <150)
VLDL: 35 mg/dL (ref 0–40)

## 2011-04-04 LAB — COMPREHENSIVE METABOLIC PANEL
AST: 38 U/L — ABNORMAL HIGH (ref 0–37)
Alkaline Phosphatase: 44 U/L (ref 39–117)
BUN: 12 mg/dL (ref 6–23)
Creat: 0.82 mg/dL (ref 0.50–1.10)

## 2011-04-04 NOTE — Progress Notes (Signed)
  Subjective:    Patient ID: [name], female    DOB: 03/30/1960, 51 y.o.   MRN: [id number]  HPI Patient presents today for followup of hypertension, hyperlipidemia, prediabetes. She also has a complaint of lower extremity edema. She says this is begun on for several months. It is slight and mostly around the ankles. It does not seem to be getting worse. The swelling will go down when she puts her legs up.  Hypertension-patient is taking her Coreg and her HCTZ. She has had several blood pressures at home of 146/98. She denies any dizziness, headache, chest pain, shortness of breath.  Hyperlipidemia-patient is not taking her Pravachol. She would like her cholesterol rechecked today. She is working to lose weight change her diet.  Prediabetic-patient has done diabetes education. She is working on changing her diet and has lost a little bit of weight. She is not checking her blood sugars.   Review of Systems    see above Objective:   Physical Exam Vital signs reviewed General appearance - alert, well appearing, and in no distress and oriented to person, place, and time Heart - normal rate, regular rhythm, normal S1, S2, no murmurs, rubs, clicks or gallops Chest - clear to auscultation, no wheezes, rales or rhonchi, symmetric air entry, no tachypnea, retractions or cyanosis Abdomen - soft, nontender, nondistended, no masses or organomegaly Extremities - peripheral pulses normal, there is trace edema to ankles bilaterally        Assessment & Plan:

## 2011-04-04 NOTE — Assessment & Plan Note (Signed)
At goal today but elevated at home. At home her levels have been 146/98. Rechecking cmet today to look at potassium. Consider sending for ambulatory blood pressures.

## 2011-04-04 NOTE — Assessment & Plan Note (Signed)
Patient states she has lost a little bit of weight. We'll recheck A1c today. She has gone to diabetes education and is trying to make changes in her diet.

## 2011-04-04 NOTE — Assessment & Plan Note (Signed)
Patient with slight bilateral lower extremity edema. Good pulses. Patient to try compression hose. I am checking a BNP today. I think this is likely to be some venous insufficiency, however if her BNP were high, we would consider an echo. Her risk factors for heart failure include hypertension.

## 2011-04-04 NOTE — Patient Instructions (Signed)
It was nice to see you today. I will call you with your lab results. Please get some compression stockings from [facility] or the pharmacy We can consider doing Lasix if your labs look good.

## 2011-04-04 NOTE — Assessment & Plan Note (Signed)
Check cholesterol levels today 

## 2011-04-04 NOTE — Assessment & Plan Note (Signed)
>>  ASSESSMENT AND PLAN FOR DM (DIABETES MELLITUS) WITH COMPLICATIONS (HCC) WRITTEN ON 04/04/2011  9:10 AM BY SPIEGEL, RACHEL L, MD  Patient states she has lost a little bit of weight. We'll recheck A1c today. She has gone to diabetes education and is trying to make changes in her diet.

## 2011-04-05 ENCOUNTER — Telehealth: Payer: Self-pay | Admitting: Family Medicine

## 2011-04-05 NOTE — Telephone Encounter (Signed)
Please call patient for results from labs

## 2011-04-05 NOTE — Telephone Encounter (Signed)
Cholesterol higher, will forward to MD. Milas Gain, Maryjo Rochester

## 2011-04-08 ENCOUNTER — Telehealth: Payer: Self-pay | Admitting: Family Medicine

## 2011-04-08 MED ORDER — FUROSEMIDE 20 MG PO TABS: 20.0000 mg | ORAL_TABLET | Freq: Every day | ORAL | Status: DC | PRN

## 2011-04-08 NOTE — Telephone Encounter (Signed)
Abnormal values, will send to MD. Milas Gain, Maryjo Rochester

## 2011-04-08 NOTE — Telephone Encounter (Signed)
Called pt to let her know that labs look ok.  She says that she has been feeling fatigued and a little bit dizzy.  I advised her to check her blood sugars. If they are over 250 she should give [place] a call to be seen sooner. She should make an appointment she continues to feel poorly

## 2011-04-08 NOTE — Telephone Encounter (Signed)
Calling for lab results. °

## 2011-04-12 ENCOUNTER — Ambulatory Visit
Admission: RE | Admit: 2011-04-12 | Discharge: 2011-04-12 | Disposition: A | Discharge status: Home / Self Care | Payer: PRIVATE HEALTH INSURANCE | Source: Ambulatory Visit | Attending: Family Medicine | Admitting: Family Medicine

## 2011-04-12 ENCOUNTER — Other Ambulatory Visit: Payer: Self-pay | Admitting: Family Medicine

## 2011-04-12 DIAGNOSIS — Z1231 Encounter for screening mammogram for malignant neoplasm of breast: Secondary | ICD-10-CM

## 2011-04-17 ENCOUNTER — Other Ambulatory Visit: Payer: Self-pay | Admitting: Family Medicine

## 2011-04-17 NOTE — Telephone Encounter (Signed)
Refill request

## 2011-04-19 ENCOUNTER — Encounter: Payer: Self-pay | Admitting: Oncology

## 2011-04-19 NOTE — Progress Notes (Signed)
Patient received one prescription from [facility] on 04/17/11 $14.16,her remaninig balance [facility] $225.59 and ALIGHT -0-.

## 2011-05-13 ENCOUNTER — Encounter: Payer: Self-pay | Admitting: Oncology

## 2011-05-13 NOTE — Progress Notes (Signed)
Patient received one prescription from [facility] on 05/10/11 $40.80,her remaninig balance [facility] $184.79 and ALIGHT -0-.

## 2011-05-17 ENCOUNTER — Ambulatory Visit (INDEPENDENT_AMBULATORY_CARE_PROVIDER_SITE_OTHER): Payer: PRIVATE HEALTH INSURANCE | Admitting: Family Medicine

## 2011-05-17 VITALS — BP 116/76 | HR 82 | Temp 99.1°F | Ht 63.0 in | Wt 219.0 lb

## 2011-05-17 DIAGNOSIS — I10 Essential (primary) hypertension: Secondary | ICD-10-CM

## 2011-05-17 DIAGNOSIS — R739 Hyperglycemia, unspecified: Secondary | ICD-10-CM

## 2011-05-17 DIAGNOSIS — R7303 Prediabetes: Secondary | ICD-10-CM

## 2011-05-17 DIAGNOSIS — R7309 Other abnormal glucose: Secondary | ICD-10-CM

## 2011-05-17 LAB — POCT URINALYSIS DIPSTICK
Bilirubin, UA: NEGATIVE
Blood, UA: NEGATIVE
Glucose, UA: NEGATIVE
Spec Grav, UA: 1.02

## 2011-05-17 LAB — GLUCOSE, CAPILLARY: Glucose-Capillary: 137 mg/dL — ABNORMAL HIGH (ref 70–99)

## 2011-05-17 MED ORDER — METFORMIN HCL 1000 MG PO TABS: 1000.0000 mg | ORAL_TABLET | Freq: Two times a day (BID) | ORAL | Status: DC

## 2011-05-17 MED ORDER — METFORMIN HCL 500 MG PO TABS: ORAL_TABLET | ORAL | Status: DC

## 2011-05-17 NOTE — Patient Instructions (Addendum)
I would like you to stop taking Lasix Please start metformin. You'll take metformin once a day for one week then twice a day. You'll take it 2 pills in the morning and one pill at night per week You'll take 2 pills in the morning and 2 pills in the evening per week Your next month will be larger pills so you only have to take 1 in the morning and one in the evening  Come back and see me next week so we can make sure that you're doing better I will call you on Monday with your lab results  Please make an appointment to see the eye doctor for an eye exam

## 2011-05-18 LAB — COMPREHENSIVE METABOLIC PANEL
AST: 37 U/L (ref 0–37)
Albumin: 3.8 g/dL (ref 3.5–5.2)
BUN: 14 mg/dL (ref 6–23)
Calcium: 9.1 mg/dL (ref 8.4–10.5)
Chloride: 100 mEq/L (ref 96–112)
Glucose, Bld: 115 mg/dL — ABNORMAL HIGH (ref 70–99)
Potassium: 3.1 mEq/L — ABNORMAL LOW (ref 3.5–5.3)
Sodium: 142 mEq/L (ref 135–145)
Total Protein: 6.7 g/dL (ref 6.0–8.3)

## 2011-05-19 NOTE — Progress Notes (Signed)
  Subjective:    Patient ID: [name], female    DOB: 1960-10-22, 51 y.o.   MRN: [id number]  HPI  Pt presents with several days of increasing thirst, esp at night, increased urination, esp at night and morning dizziness and blurry vision.  She denies dysuria and has not had N/V/D.  She checks her CBGs regularly and yesterday AM it was 160.  She started lasix which she takes in the AM.  She denies cough, SOB, CP.  She is having some leg cramps.    Review of Systems Denies fevers.  See above.  Otherwise neg.    Objective:   Physical Exam Vital signs reviewed General appearance - alert, well appearing, and in no distress Heart - normal rate, regular rhythm, normal S1, S2, no murmurs, rubs, clicks or gallops Chest - clear to auscultation, no wheezes, rales or rhonchi, symmetric air entry, no tachypnea, retractions or cyanosis Abdomen - soft, nontender, nondistended, no masses or organomegaly Extremities - peripheral pulses normal, no pedal edema, no clubbing or cyanosis  Vision screen done Orthostatics done See chart       Assessment & Plan:  Prediabetes Concern for symptomatic hyperglycemia. Vision was okay on check, not orthostatic. CBG today was only 137. Asked her to stop her Lasix, check CMET today.  See back early next week for follow up. Will start metformin today due to history of high blood sugars.  ESSENTIAL HYPERTENSION BP Readings from Last 3 Encounters:  05/17/11 116/76  04/04/11 133/79  03/12/11 136/88   Blood pressure was normal today. Plan to discontinue Lasix and see back next week.

## 2011-05-20 ENCOUNTER — Encounter: Payer: Self-pay | Admitting: Oncology

## 2011-05-20 NOTE — Progress Notes (Signed)
Patient received two prescriptions from [facility] pharmacy on 05/17/11 $23.69,her remaninig balance [facility] $161.10and ALIGHT -0-.

## 2011-05-21 ENCOUNTER — Encounter: Payer: Self-pay | Admitting: Family Medicine

## 2011-05-21 ENCOUNTER — Telehealth (HOSPITAL_COMMUNITY): Payer: Self-pay | Admitting: Family Medicine

## 2011-05-21 MED ORDER — POTASSIUM CHLORIDE CRYS ER 20 MEQ PO TBCR: 20.0000 meq | EXTENDED_RELEASE_TABLET | Freq: Two times a day (BID) | ORAL | Status: DC

## 2011-05-21 NOTE — Assessment & Plan Note (Signed)
BP Readings from Last 3 Encounters:  05/17/11 116/76  04/04/11 133/79  03/12/11 136/88   Blood pressure was normal today. Plan to discontinue Lasix and see back next week.

## 2011-05-21 NOTE — Assessment & Plan Note (Signed)
>>  ASSESSMENT AND PLAN FOR DM (DIABETES MELLITUS) WITH COMPLICATIONS (HCC) WRITTEN ON 05/21/2011  2:07 PM BY SPIEGEL, RACHEL L, MD  Concern for symptomatic hyperglycemia. Vision was okay on check, not orthostatic. CBG today was only 137. Asked her to stop her Lasix , check CMET today.  See back early next week for follow up. Will start metformin  today due to history of high blood sugars.

## 2011-05-21 NOTE — Assessment & Plan Note (Signed)
Concern for symptomatic hyperglycemia. Vision was okay on check, not orthostatic. CBG today was only 137. Asked her to stop her Lasix, check CMET today.  See back early next week for follow up. Will start metformin today due to history of high blood sugars.

## 2011-05-21 NOTE — Telephone Encounter (Signed)
Left VM for pt.  Called pt to let her knwo that her potassium was low.  Sent in K  For her to pharmacy.  To take BID x 14 days.  To see me this week or next week.

## 2011-05-27 ENCOUNTER — Telehealth: Payer: Self-pay | Admitting: *Deleted

## 2011-05-27 NOTE — Telephone Encounter (Signed)
left voice message to inform the patient of the new date and time 07-22-2011 starting at 9:00

## 2011-05-29 ENCOUNTER — Telehealth: Payer: Self-pay | Admitting: Family Medicine

## 2011-05-29 NOTE — Telephone Encounter (Signed)
Is asking for lab results from the 12th

## 2011-05-29 NOTE — Telephone Encounter (Signed)
Few abnormal results I am not familiar with.  Will forward to MD. Milas Gain, Maryjo Rochester

## 2011-05-30 NOTE — Telephone Encounter (Signed)
Everything ok except K a little low.  I sent in potassium for her to take and I called last week but I left a message and I did not leave a note in the chart apparently.  She was supposed to come back and see me this week.  pls make sure she has an appt. Thanks!

## 2011-05-31 NOTE — Telephone Encounter (Signed)
Pt informed and appt made. [name]  

## 2011-06-07 ENCOUNTER — Ambulatory Visit: Payer: PRIVATE HEALTH INSURANCE | Admitting: Family Medicine

## 2011-06-07 ENCOUNTER — Ambulatory Visit: Payer: BC Managed Care – PPO | Admitting: Oncology

## 2011-06-07 ENCOUNTER — Other Ambulatory Visit: Payer: BC Managed Care – PPO | Admitting: Lab

## 2011-06-08 ENCOUNTER — Emergency Department (INDEPENDENT_AMBULATORY_CARE_PROVIDER_SITE_OTHER)
Admission: EM | Admit: 2011-06-08 | Discharge: 2011-06-08 | Disposition: A | Discharge status: Home / Self Care | Payer: PRIVATE HEALTH INSURANCE | Source: Home / Self Care | Attending: Emergency Medicine | Admitting: Emergency Medicine

## 2011-06-08 ENCOUNTER — Encounter (HOSPITAL_COMMUNITY): Payer: Self-pay | Admitting: Emergency Medicine

## 2011-06-08 DIAGNOSIS — H6120 Impacted cerumen, unspecified ear: Secondary | ICD-10-CM

## 2011-06-08 DIAGNOSIS — J310 Chronic rhinitis: Secondary | ICD-10-CM

## 2011-06-08 MED ORDER — CETIRIZINE-PSEUDOEPHEDRINE ER 5-120 MG PO TB12: 1.0000 | ORAL_TABLET | Freq: Every day | ORAL | Status: DC

## 2011-06-08 NOTE — Discharge Instructions (Signed)
Cerumen Impaction  A cerumen impaction is when the wax in your ear forms a plug. This plug usually causes reduced hearing. Sometimes it also causes an earache or dizziness. Removing a cerumen impaction can be difficult and painful. The wax sticks to the ear canal. The canal is sensitive and bleeds easily. If you try to remove a heavy wax buildup with a cotton tipped swab, you may push it in further.  Irrigation with water, suction, and small ear curettes may be used to clear out the wax. If the impaction is fixed to the skin in the ear canal, ear drops may be needed for a few days to loosen the wax. People who build up a lot of wax frequently can use ear wax removal products available in your local drugstore.  SEEK MEDICAL CARE IF:    You develop an earache, increased hearing loss, or marked dizziness.  Document Released: 02/29/2004 Document Revised: 01/10/2011 Document Reviewed: 04/20/2009  [employer] Patient Information 2012 [employer].

## 2011-06-08 NOTE — ED Provider Notes (Signed)
History     CSN: [id number]  Arrival date & time 06/08/11  1033   First MD Initiated Contact with Patient 06/08/11 1152      Chief Complaint  Patient presents with  . Otalgia    (Consider location/radiation/quality/duration/timing/severity/associated sxs/prior treatment) HPI Comments: Presents urgent care today complaining of an ongoing ear discomfort more the right side than the left. This almost like I can't hear well" patient has also been expressing a runny nose and have been sneezing a lot recently. Show no fevers or no ear discharge.  Patient is a 51 y.o. female presenting with ear pain.  Otalgia This is a new problem. The current episode started more than 1 week ago. There is pain in both ears. The problem occurs constantly. The problem has not changed since onset.There has been no fever. Associated symptoms include rhinorrhea. Pertinent negatives include no ear discharge, no headaches, no hearing loss, no abdominal pain, no diarrhea, no vomiting, no neck pain, no cough and no rash.    Past Medical History  Diagnosis Date  . Breast CA   . Hypertension   . Hyperlipidemia   . Thrombocytopenia   . Diabetes mellitus     Past Surgical History  Procedure Date  . Thyroidectomy   . Breast surgery 09/12/10    excision of skin mass - right breast    Family History  Problem Relation Age of Onset  . Heart disease Mother   . Stroke Father   . Heart disease Brother     History  Substance Use Topics  . Smoking status: Never Smoker   . Smokeless tobacco: Never Used  . Alcohol Use: No    OB History    Grav Para Term Preterm Abortions TAB SAB Ect Mult Living                  Review of Systems  Constitutional: Negative for activity change and appetite change.  HENT: Positive for ear pain and rhinorrhea. Negative for hearing loss, congestion, neck pain, postnasal drip, sinus pressure and ear discharge.   Respiratory: Negative for apnea and cough.   Gastrointestinal:  Negative for vomiting, abdominal pain and diarrhea.  Genitourinary: Negative for dysuria.  Skin: Negative for rash.  Neurological: Negative for headaches.    Allergies  Review of patient's allergies indicates no known allergies.  Home Medications   Current Outpatient Rx  Name Route Sig Dispense Refill  . ACETAMINOPHEN 500 MG PO TABS Oral Take 500 mg by mouth as needed.     Marland Kitchen CALCIUM PO Oral Take 2,000 mg by mouth 2 (two) times daily. To take it bid for five days, then daily thereafter.    Marland Kitchen CARVEDILOL 25 MG PO TABS Oral Take 1 tablet (25 mg total) by mouth 2 (two) times daily with a meal. 60 tablet 11  . CETIRIZINE-PSEUDOEPHEDRINE ER 5-120 MG PO TB12 Oral Take 1 tablet by mouth daily. 30 tablet 0  . VITAMIN D 1000 UNITS PO TABS Oral Take 2,000 Units by mouth daily.      Marland Kitchen HYDROCHLOROTHIAZIDE 25 MG PO TABS  TAKE ONE TABLET BY MOUTH EVERY DAY 30 tablet 11  . VICODIN PO Oral Take by mouth as needed.      Marland Kitchen METFORMIN HCL 1000 MG PO TABS Oral Take 1 tablet (1,000 mg total) by mouth 2 (two) times daily with a meal. 60 tablet 3  . METFORMIN HCL 500 MG PO TABS  Take as directed 120 tablet 0  . POTASSIUM CHLORIDE  CRYS ER 20 MEQ PO TBCR Oral Take 1 tablet (20 mEq total) by mouth 2 (two) times daily. 28 tablet 0  . PRAVASTATIN SODIUM 40 MG PO TABS Oral Take 40 mg by mouth daily.      Marland Kitchen TAMOXIFEN CITRATE 20 MG PO TABS Oral Take 1 tablet (20 mg total) by mouth daily. 30 tablet 4  . VENLAFAXINE HCL ER 37.5 MG PO CP24 Oral Take 1 capsule (37.5 mg total) by mouth daily. 30 capsule 4    BP 142/73  Pulse 74  Temp(Src) 98.8 F (37.1 C) (Oral)  Resp 20  SpO2 99%  Physical Exam  Nursing note and vitals reviewed. Constitutional: She appears well-developed and well-nourished.  HENT:  Head: Normocephalic.  Right Ear: Hearing and tympanic membrane normal.  Left Ear: Hearing normal.  Ears:  Eyes: Conjunctivae are normal. Right eye exhibits no discharge. Left eye exhibits no discharge.  Neck: Neck  supple.  Pulmonary/Chest: Effort normal.  Abdominal: Soft. There is no tenderness.  Musculoskeletal: Normal range of motion.  Neurological: She is alert.  Skin: No rash noted. No erythema.    ED Course  Procedures (including critical care time)  Labs Reviewed - No data to display No results found.   1. Cerumen impaction   2. Rhinitis       MDM  Bilateral total cerumen ear impaction. Patient has also had some rhinitis and sneezing. Have irrigated both her ear canals post procedure evaluated in her ear canal and tympanic membranes which were normal. Have also recommended she start some an antihistamine       Jimmie Molly, MD 06/08/11 2047

## 2011-06-08 NOTE — ED Notes (Signed)
Ear pain for one week.  Ear was stuffy, then not, now stuffy again and having difficulty hearing.  Both ears are not feeling normal, reports runny nose .

## 2011-06-12 ENCOUNTER — Ambulatory Visit (INDEPENDENT_AMBULATORY_CARE_PROVIDER_SITE_OTHER): Payer: PRIVATE HEALTH INSURANCE | Admitting: Family Medicine

## 2011-06-12 ENCOUNTER — Encounter: Payer: Self-pay | Admitting: Oncology

## 2011-06-12 ENCOUNTER — Encounter: Payer: Self-pay | Admitting: Family Medicine

## 2011-06-12 VITALS — BP 120/77 | HR 93 | Temp 98.5°F | Ht 63.0 in | Wt 217.0 lb

## 2011-06-12 DIAGNOSIS — R7309 Other abnormal glucose: Secondary | ICD-10-CM

## 2011-06-12 DIAGNOSIS — D6949 Other primary thrombocytopenia: Secondary | ICD-10-CM

## 2011-06-12 DIAGNOSIS — R7303 Prediabetes: Secondary | ICD-10-CM

## 2011-06-12 DIAGNOSIS — M722 Plantar fascial fibromatosis: Secondary | ICD-10-CM

## 2011-06-12 DIAGNOSIS — I10 Essential (primary) hypertension: Secondary | ICD-10-CM

## 2011-06-12 LAB — COMPREHENSIVE METABOLIC PANEL
ALT: 36 U/L — ABNORMAL HIGH (ref 0–35)
AST: 34 U/L (ref 0–37)
Alkaline Phosphatase: 47 U/L (ref 39–117)
Calcium: 9.2 mg/dL (ref 8.4–10.5)
Chloride: 103 mEq/L (ref 96–112)
Creat: 0.84 mg/dL (ref 0.50–1.10)

## 2011-06-12 NOTE — Assessment & Plan Note (Signed)
Symptoms consistent with plantar fasciitis times a month. Gave arch strap as well as instructions for exercises and icing.

## 2011-06-12 NOTE — Progress Notes (Signed)
  Subjective:    Patient ID: [name], female    DOB: Mar 22, 1960, 51 y.o.   MRN: [id number]  HPI Diabetes-patient has worked up to 1000 mg of metformin twice a day. She has not had any more symptoms of dizziness or blurred vision. She says she is having some stomach upset but this is getting better. She checks her blood sugar in the morning and they have been in the 80s. She has not had any low blood sugars.  Plantar fasciitis-over the last month, patient has increased her walking. She has noticed some heel pain over that time as well. This is on the left foot. This is worse at the first steps in the morning but is also bad in the evening and when she walks. She has not tried anything for this.   Review of Systems No chest pain, shortness of breath, palpitations    Objective:   Physical Exam  Vital signs reviewed General appearance - alert, well appearing, and in no distress Heart - normal rate, regular rhythm, normal S1, S2, no murmurs, rubs, clicks or gallops Chest - clear to auscultation, no wheezes, rales or rhonchi, symmetric air entry, no tachypnea, retractions or cyanosis Abdomen - soft, nontender, nondistended, no masses or organomegaly Ext-right leg with 1+ edema to the midcalf, normal DP pulses present Left foot with tenderness at the insertion of the plantar fascia, no swelling, no deformity. Patient has flat feet      Assessment & Plan:  `

## 2011-06-12 NOTE — Patient Instructions (Signed)

## 2011-06-12 NOTE — Progress Notes (Signed)
Patient received three prescriptions from [facility] pharmacy on 06/10/11 $64.69,her remaninig balance [facility] $96.46 and ALIGHT -0-.

## 2011-06-12 NOTE — Assessment & Plan Note (Signed)
Patient now on metformin 1000 mg twice a day. Blood sugars in the a.m. have been in the 80s. Plan to recheck an A1c in 3 months.

## 2011-06-12 NOTE — Assessment & Plan Note (Signed)
>>  ASSESSMENT AND PLAN FOR DM (DIABETES MELLITUS) WITH COMPLICATIONS (HCC) WRITTEN ON 06/12/2011 10:52 AM BY SPIEGEL, RACHEL L, MD  Patient now on metformin  1000 mg twice a day. Blood sugars in the a.m. have been in the 80s. Plan to recheck an A1c in 3 months.

## 2011-06-13 LAB — CBC
HCT: 39.6 % (ref 36.0–46.0)
Hemoglobin: 13 g/dL (ref 12.0–15.0)
MCH: 25.2 pg — ABNORMAL LOW (ref 26.0–34.0)
MCHC: 32.8 g/dL (ref 30.0–36.0)
MCV: 76.7 fL — ABNORMAL LOW (ref 78.0–100.0)
RDW: 14.2 % (ref 11.5–15.5)

## 2011-06-14 ENCOUNTER — Telehealth: Payer: Self-pay | Admitting: Family Medicine

## 2011-06-14 NOTE — Telephone Encounter (Signed)
Pls tell pt that her labs looked ok except for a low plt of 87.  Her oncology doctor will follow this with her.

## 2011-06-14 NOTE — Telephone Encounter (Signed)
Left message on voicemail informing of message from MD. 

## 2011-07-15 ENCOUNTER — Other Ambulatory Visit: Payer: Self-pay | Admitting: *Deleted

## 2011-07-15 DIAGNOSIS — C50919 Malignant neoplasm of unspecified site of unspecified female breast: Secondary | ICD-10-CM

## 2011-07-15 MED ORDER — TAMOXIFEN CITRATE 20 MG PO TABS: 20.0000 mg | ORAL_TABLET | Freq: Every day | ORAL | Status: DC

## 2011-07-17 ENCOUNTER — Encounter: Payer: Self-pay | Admitting: Family Medicine

## 2011-07-17 ENCOUNTER — Telehealth: Payer: Self-pay | Admitting: Medical Oncology

## 2011-07-17 ENCOUNTER — Ambulatory Visit (INDEPENDENT_AMBULATORY_CARE_PROVIDER_SITE_OTHER): Payer: PRIVATE HEALTH INSURANCE | Admitting: Family Medicine

## 2011-07-17 VITALS — BP 130/82 | HR 85 | Temp 98.7°F | Ht 63.0 in | Wt 217.0 lb

## 2011-07-17 DIAGNOSIS — R7309 Other abnormal glucose: Secondary | ICD-10-CM

## 2011-07-17 DIAGNOSIS — I10 Essential (primary) hypertension: Secondary | ICD-10-CM

## 2011-07-17 DIAGNOSIS — M722 Plantar fascial fibromatosis: Secondary | ICD-10-CM

## 2011-07-17 DIAGNOSIS — D6949 Other primary thrombocytopenia: Secondary | ICD-10-CM

## 2011-07-17 DIAGNOSIS — R7303 Prediabetes: Secondary | ICD-10-CM

## 2011-07-17 LAB — COMPREHENSIVE METABOLIC PANEL
CO2: 29 mEq/L (ref 19–32)
Glucose, Bld: 73 mg/dL (ref 70–99)
Sodium: 143 mEq/L (ref 135–145)
Total Bilirubin: 0.4 mg/dL (ref 0.3–1.2)
Total Protein: 6.6 g/dL (ref 6.0–8.3)

## 2011-07-17 LAB — CBC
MCV: 73.8 fL — ABNORMAL LOW (ref 78.0–100.0)
Platelets: 81 10*3/uL — ABNORMAL LOW (ref 150–400)
RBC: 5.23 MIL/uL — ABNORMAL HIGH (ref 3.87–5.11)
WBC: 5.2 10*3/uL (ref 4.0–10.5)

## 2011-07-17 LAB — GLUCOSE, CAPILLARY: Glucose-Capillary: 74 mg/dL (ref 70–99)

## 2011-07-17 MED ORDER — AMLODIPINE BESYLATE 10 MG PO TABS: 10.0000 mg | ORAL_TABLET | Freq: Every day | ORAL | Status: DC

## 2011-07-17 NOTE — Telephone Encounter (Signed)
Ok to refill 

## 2011-07-17 NOTE — Assessment & Plan Note (Signed)
Patien believe she is having side effects from the metformin. We'll stop metformin today and she will check her sugars every morning. I will see her back in 2 weeks and we'll consider starting glipizide. Will see patient in 2 weeks for nutrition consultation and consider referring for diabetes education.

## 2011-07-17 NOTE — Progress Notes (Signed)
  Subjective:    Patient ID: [name], female    DOB: [birth year], 51 y.o.   MRN: [id number]  HPI  Patient presents with complaint of feeling fatigued and having diarrhea and nausea. She thinks that this started when she went up to 1000 mg twice a day on her metformin. She has not had any fevers, chills. She is still able to eat and drink. Her blood sugars in the morning have been in the 90s.  Leg cramps-patient notes that she continues to have leg cramps even though she stopped her Lasix. These are mostly at night. They will go away if she gets up and walks a little bit. She does not have pain in her calves when she walks.  Plantar fasciitis-patient continues to have pain in spite of arch strap and icing her feet. She is hurting when she walks and would like to walk for exercise.  Review of Systems See above    Objective:   Physical Exam Vital signs reviewed General appearance - alert, well appearing, and in no distress Heart - normal rate, regular rhythm, normal S1, S2, no murmurs, rubs, clicks or gallops Chest - clear to auscultation, no wheezes, rales or rhonchi, symmetric air entry, no tachypnea, retractions or cyanosis Abdomen - soft, nontender, nondistended, no masses or organomegaly Extremities - peripheral pulses normal, no pedal edema, no clubbing or cyanosis        Assessment & Plan:

## 2011-07-17 NOTE — Assessment & Plan Note (Signed)
Patient to obtain an appointment with sports medicine for evaluation. She may need orthotics.

## 2011-07-17 NOTE — Assessment & Plan Note (Signed)
Change patient from HCTZ to amlodipine due to leg cramping. He back in 2 weeks to follow blood pressure and see how leg cramping is doing.

## 2011-07-17 NOTE — Patient Instructions (Addendum)
Please stop HCTZ Please start amlodipine I have sent this to your pharmacy Stop metformin We will consider starting another medication when I see you in 2 weeks

## 2011-07-17 NOTE — Assessment & Plan Note (Signed)
>>  ASSESSMENT AND PLAN FOR DM (DIABETES MELLITUS) WITH COMPLICATIONS (HCC) WRITTEN ON 07/17/2011 10:54 AM BY SPIEGEL, RACHEL L, MD  Patien believe she is having side effects from the metformin . We'll stop metformin  today and she will check her sugars every morning. I will see her back in 2 weeks and we'll consider starting glipizide. Will see patient in 2 weeks for nutrition consultation and consider referring for diabetes education.

## 2011-07-17 NOTE — Telephone Encounter (Signed)
Called in tamoxifen refill per Dr Welton Flakes 07/15/11 #30- no refills indicated. I will verify with Dr Welton Flakes the number of refills on this rx. Tamoxifen initiated jan 2012

## 2011-07-19 ENCOUNTER — Encounter: Payer: Self-pay | Admitting: Oncology

## 2011-07-19 NOTE — Progress Notes (Signed)
[facility] received medication from [facility] 30.29 remaining balance 66.12

## 2011-07-22 ENCOUNTER — Ambulatory Visit (HOSPITAL_BASED_OUTPATIENT_CLINIC_OR_DEPARTMENT_OTHER): Payer: PRIVATE HEALTH INSURANCE | Admitting: Oncology

## 2011-07-22 ENCOUNTER — Encounter: Payer: Self-pay | Admitting: Oncology

## 2011-07-22 ENCOUNTER — Other Ambulatory Visit (HOSPITAL_BASED_OUTPATIENT_CLINIC_OR_DEPARTMENT_OTHER): Payer: PRIVATE HEALTH INSURANCE | Admitting: Lab

## 2011-07-22 ENCOUNTER — Telehealth: Payer: Self-pay | Admitting: *Deleted

## 2011-07-22 VITALS — BP 146/80 | HR 76 | Temp 98.7°F | Ht 63.0 in | Wt 217.0 lb

## 2011-07-22 DIAGNOSIS — D693 Immune thrombocytopenic purpura: Secondary | ICD-10-CM

## 2011-07-22 DIAGNOSIS — N959 Unspecified menopausal and perimenopausal disorder: Secondary | ICD-10-CM

## 2011-07-22 DIAGNOSIS — Z170 Estrogen receptor positive status [ER+]: Secondary | ICD-10-CM

## 2011-07-22 DIAGNOSIS — C50919 Malignant neoplasm of unspecified site of unspecified female breast: Secondary | ICD-10-CM

## 2011-07-22 DIAGNOSIS — C50319 Malignant neoplasm of lower-inner quadrant of unspecified female breast: Secondary | ICD-10-CM

## 2011-07-22 DIAGNOSIS — D6949 Other primary thrombocytopenia: Secondary | ICD-10-CM

## 2011-07-22 LAB — CBC WITH DIFFERENTIAL/PLATELET
Basophils Absolute: 0 10*3/uL (ref 0.0–0.1)
Eosinophils Absolute: 0.1 10*3/uL (ref 0.0–0.5)
HGB: 12.6 g/dL (ref 11.6–15.9)
MONO#: 0.4 10*3/uL (ref 0.1–0.9)
NEUT#: 2.7 10*3/uL (ref 1.5–6.5)
RDW: 14.8 % — ABNORMAL HIGH (ref 11.2–14.5)
WBC: 4.7 10*3/uL (ref 3.9–10.3)
lymph#: 1.5 10*3/uL (ref 0.9–3.3)

## 2011-07-22 MED ORDER — TAMOXIFEN CITRATE 20 MG PO TABS: 20.0000 mg | ORAL_TABLET | Freq: Every day | ORAL | Status: DC

## 2011-07-22 MED ORDER — VENLAFAXINE HCL ER 37.5 MG PO CP24: 37.5000 mg | ORAL_CAPSULE | Freq: Every day | ORAL | Status: DC

## 2011-07-22 NOTE — Progress Notes (Signed)
OFFICE PROGRESS NOTE    Danielle Plunk, MD [address] [place] [address]  DIAGNOSIS: 51 year old female with invasive ductal carcinoma of the right breast originally diagnosed in June 2011. With subsequent subcutaneous recurrence  PRIOR THERAPY:  #1 patient initially underwent a right mastectomy in June 2011 with the final pathology revealing 2 foci of invasive ductal carcinoma, comment 1 measuring 2.2 cm and a second one measuring 0.5 cm. The tumor was ER positive PR positive HER-2/neu negative with a proliferation marker 11%.  #2 the patient had Oncotype DX testing done that revealed recurrence score of 27 with 18% her risk of distant recurrence with tamoxifen alone.  #3 patient had adjuvant chemotherapy consisting of Taxotere and Cytoxan every 3 weeks for a total of 4 cycles from 10/23/2009 2 12/25/2009.  #4 patient had reconstruction performed by Dr. Etter Sjogren.  #5 she was then begun on tamoxifen 20 mg daily in January 2012. Number  #6 patient then developed a subcutaneous nodule at the site of her original mastectomy scar. This was resected. She then went on to have radiation therapy locally to this to the mastectomy site.  #7 patient continues on tamoxifen 20 mg daily.  #8 Effexor XR 37.5 mg daily for hot flashes  CURRENT THERAPY: Tamoxifen 20 mg daily  INTERVAL HISTORY: [name] 51 y.o. female returns for followup visit today.Overall patient is doing well. She denies any fevers chills night sweats headaches shortness of breath chest pains palpitations. She does state that she is fatigued. She wants to know what type of multivitamin to take and I have told her to take it could be complex and women's 1 a day multivitamin. She is also encouraged to exercise. She does continue to experience some hot flashes but she is not taking the Effexor and I have encouraged her to do so. She denies any myalgias and arthralgias no nausea or vomiting. No bleeding or  easy bruising.Remainder of the 10 point review of systems is negative. MEDICAL HISTORY: Past Medical History  Diagnosis Date  . Breast CA   . Hypertension   . Hyperlipidemia   . Thrombocytopenia   . Diabetes mellitus     ALLERGIES:   has no known allergies.  MEDICATIONS:  Current Outpatient Prescriptions  Medication Sig Dispense Refill  . acetaminophen (TYLENOL) 500 MG tablet Take 500 mg by mouth as needed.       Marland Kitchen amLODipine (NORVASC) 10 MG tablet Take 1 tablet (10 mg total) by mouth daily.  30 tablet  11  . CALCIUM PO Take 2,000 mg by mouth 2 (two) times daily. To take it bid for five days, then daily thereafter.      . carvedilol (COREG) 25 MG tablet Take 1 tablet (25 mg total) by mouth 2 (two) times daily with a meal.  60 tablet  11  . cetirizine-pseudoephedrine (ZYRTEC-D) 5-120 MG per tablet Take 1 tablet by mouth daily.  30 tablet  0  . cholecalciferol (VITAMIN D) 1000 UNITS tablet Take 2,000 Units by mouth daily.        . Hydrocodone-Acetaminophen (VICODIN PO) Take by mouth as needed.        . pravastatin (PRAVACHOL) 40 MG tablet Take 40 mg by mouth daily.        . tamoxifen (NOLVADEX) 20 MG tablet Take 1 tablet (20 mg total) by mouth daily.  30 tablet  4  . venlafaxine (EFFEXOR-XR) 37.5 MG 24 hr capsule Take 1 capsule (37.5 mg total) by mouth daily.  30 capsule  4  . metFORMIN (GLUCOPHAGE) 1000 MG tablet Take 1,000 mg by mouth 2 (two) times daily with a meal.        SURGICAL HISTORY:  Past Surgical History  Procedure Date  . Thyroidectomy   . Breast surgery 09/12/10    excision of skin mass - right breast    REVIEW OF SYSTEMS:  Pertinent items are noted in HPI.   PHYSICAL EXAMINATION: General appearance: alert, cooperative and appears stated age Head: Normocephalic, without obvious abnormality, atraumatic Neck: no adenopathy, no carotid bruit, no JVD, supple, symmetrical, trachea midline and thyroid not enlarged, symmetric, no tenderness/mass/nodules Lymph nodes:  Cervical, supraclavicular, and axillary nodes normal. Resp: clear to auscultation bilaterally and normal percussion bilaterally Back: symmetric, no curvature. ROM normal. No CVA tenderness. GI: soft, non-tender; bowel sounds normal; no masses,  no organomegaly Extremities: extremities normal, atraumatic, no cyanosis or edema Neurologic: Alert and oriented X 3, normal strength and tone. Normal symmetric reflexes. Normal coordination and gait Left breast no masses or nipple discharge no skin changes. Right mastectomy scar looks well healed there is changes from radiation otherwise no masses no nodularity. ECOG PERFORMANCE STATUS: 1 - Symptomatic but completely ambulatory  Blood pressure 146/80, pulse 76, temperature 98.7 F (37.1 C), temperature source Oral, height 5\' 3"  (1.6 m), weight 217 lb (98.431 kg).  LABORATORY DATA: Lab Results  Component Value Date   WBC 4.7 07/22/2011   HGB 12.6 07/22/2011   HCT 38.2 07/22/2011   MCV 76.6* 07/22/2011   PLT 97* 07/22/2011      Chemistry      Component Value Date/Time   NA 143 07/17/2011 1036   K 3.3* 07/17/2011 1036   CL 103 07/17/2011 1036   CO2 29 07/17/2011 1036   BUN 9 07/17/2011 1036   CREATININE 0.85 07/17/2011 1036   CREATININE 0.83 01/07/2011 1142   CREATININE 0.83 01/07/2011 1142      Component Value Date/Time   CALCIUM 9.3 07/17/2011 1036   ALKPHOS 42 07/17/2011 1036   AST 35 07/17/2011 1036   ALT 40* 07/17/2011 1036   BILITOT 0.4 07/17/2011 1036       RADIOGRAPHIC STUDIES:  No results found.  ASSESSMENT: 51 year old female with #1 right breast cancer status post mastectomy with subsequent local recurrence which was resected followed by radiation therapy.She is continuing tamoxifen 20 mg daily.  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  #2 ITP observation only Her platelets look relatively stable.  #3 patient is encouraged to continue the Effexor.   All questions were answered. The patient knows to call the clinic with any problems, questions or concerns. We can certainly see the patient much sooner if necessary.  I spent 25 minutes counseling the patient face to face. The total time spent in the appointment was 30 minutes.    Drue Second, MD Medical/Oncology [facility] [phone] (beeper) [phone] (Office)  07/22/2011, 8:45 AM

## 2011-07-22 NOTE — Telephone Encounter (Signed)
gave patient appointment for 02-2012 by the patient's request to come in in 02-2012

## 2011-07-22 NOTE — Patient Instructions (Addendum)
1. Take a good B complex vitamin and womens one a day multivitamin. Both can be obtained over the counter.  2. I will see you back in 6 months

## 2011-07-23 LAB — COMPREHENSIVE METABOLIC PANEL
ALT: 36 U/L — ABNORMAL HIGH (ref 0–35)
AST: 28 U/L (ref 0–37)
Alkaline Phosphatase: 42 U/L (ref 39–117)
CO2: 28 mEq/L (ref 19–32)
Calcium: 9.4 mg/dL (ref 8.4–10.5)
Glucose, Bld: 121 mg/dL — ABNORMAL HIGH (ref 70–99)
Potassium: 3.4 mEq/L — ABNORMAL LOW (ref 3.5–5.3)
Sodium: 143 mEq/L (ref 135–145)
Total Bilirubin: 0.4 mg/dL (ref 0.3–1.2)

## 2011-07-26 ENCOUNTER — Ambulatory Visit (INDEPENDENT_AMBULATORY_CARE_PROVIDER_SITE_OTHER): Payer: PRIVATE HEALTH INSURANCE | Admitting: Family Medicine

## 2011-07-26 ENCOUNTER — Encounter: Payer: Self-pay | Admitting: Family Medicine

## 2011-07-26 VITALS — BP 125/74 | HR 73 | Ht 63.0 in | Wt 217.0 lb

## 2011-07-26 DIAGNOSIS — M722 Plantar fascial fibromatosis: Secondary | ICD-10-CM

## 2011-07-30 ENCOUNTER — Ambulatory Visit (INDEPENDENT_AMBULATORY_CARE_PROVIDER_SITE_OTHER): Payer: PRIVATE HEALTH INSURANCE | Admitting: Family Medicine

## 2011-07-30 ENCOUNTER — Encounter: Payer: Self-pay | Admitting: Family Medicine

## 2011-07-30 VITALS — BP 119/84 | HR 96 | Temp 98.7°F | Ht 63.0 in | Wt 220.0 lb

## 2011-07-30 DIAGNOSIS — R7303 Prediabetes: Secondary | ICD-10-CM

## 2011-07-30 DIAGNOSIS — R7309 Other abnormal glucose: Secondary | ICD-10-CM

## 2011-07-30 NOTE — Assessment & Plan Note (Signed)
A1c 6.1 today. Patient willing to go back on metformin 500 once a day. She was unable to tolerate full dose. We will check her A1c every 3-6 months.

## 2011-07-30 NOTE — Progress Notes (Signed)
  Subjective:    Patient ID: [name], female    DOB: 12-14-1960, 51 y.o.   MRN: [id number]  HPI  Patient presents for recheck of diabetes. Since she's been off the metformin, diarrhea is gone. She is feeling well but better and eating well. She's been checking her blood sugars at home and yesterday her morning blood sugar is 108. She would like to go back on some medicine but does not want to be on full dose metformin.  Review of Systems Denies CP, SOB, HA, N/V/D, fever     Objective:   Physical Exam  Vital signs reviewed General appearance - alert, well appearing, and in no distress Heart - normal rate, regular rhythm, normal S1, S2, no murmurs, rubs, clicks or gallops Chest - clear to auscultation, no wheezes, rales or rhonchi, symmetric air entry, no tachypnea, retractions or cyanosis       Assessment & Plan:

## 2011-07-30 NOTE — Assessment & Plan Note (Signed)
>>  ASSESSMENT AND PLAN FOR DM (DIABETES MELLITUS) WITH COMPLICATIONS (HCC) WRITTEN ON 07/30/2011  2:53 PM BY SPIEGEL, RACHEL L, MD  A1c 6.1 today. Patient willing to go back on metformin  500 once a day. She was unable to tolerate full dose. We will check her A1c every 3-6 months.

## 2011-08-01 ENCOUNTER — Telehealth: Payer: Self-pay | Admitting: Family Medicine

## 2011-08-01 ENCOUNTER — Encounter: Payer: Self-pay | Admitting: Family Medicine

## 2011-08-01 ENCOUNTER — Ambulatory Visit: Payer: PRIVATE HEALTH INSURANCE | Admitting: Family Medicine

## 2011-08-01 MED ORDER — FUROSEMIDE 20 MG PO TABS: 20.0000 mg | ORAL_TABLET | Freq: Every day | ORAL | Status: DC | PRN

## 2011-08-01 NOTE — Telephone Encounter (Signed)
pls tell pt that i refilled her lasix.  She should take this as needed for leg swelling. We had stopped it because she was feeling dizzy.  Do not take if makes her feel dizzy when she stands up.

## 2011-08-01 NOTE — Progress Notes (Signed)
Patient come into today to discuss diet.   Goals: Get off BP meds and DM meds Lose weight Maintain health  Diet History:  Eating pattern:     How many meals:no breakfast, eat around noon, usually eat cereal and sandwich Dinner-small amount of meat, veggies, fruit   How many snacks in the day:1  Foods that are consistent in your diet: (foods you eat most days of the week) Cereal (raisin bran, vegetables)   Foods you avoid and why (fruits, vegetables, beans, fish/seafood):  None  24 hour recall:  Breakfast (7 AM):coffee, 1 tsp cream, splenda.  Quick oatmeal 1 cup with splenda  Lunch (12PM):skipped lunch.  Pepsi and water  Dinner (6PM): onion, squash broccoli 1/4 tsp margarine and 1/2 baked pork chop (salt and pepper)      Exercise Usual physical activity includes walk 1 hour each morning.   A/P; Obesity, Pre-DM, HTN Plan to increase vegetable intake and space meals less than 6 hours apart.   Track soda intake and limit to occasional intake RTC in 2 weeks for MD visit and 1-2 months for nutrition appt if referred by new doctor.

## 2011-08-01 NOTE — Patient Instructions (Addendum)
Increase number of fruits and veggies Eat veggies twice a day Try to make 1/2 your plate vegetables at lunch and dinner   Try to eat every 4-6 hours so that you are getting calories regularly through the day Get protein at every single meal  Google: Lafonda Mosses --see what he has to say about sugar....   Please track your soda intake---write down on your calendar Consider food records--writing down what you eat  Please make an appt for 2 weeks from now to meet your new doctor

## 2011-08-01 NOTE — Telephone Encounter (Signed)
Pt informed. [name]  

## 2011-08-02 ENCOUNTER — Telehealth: Payer: Self-pay | Admitting: Family Medicine

## 2011-08-02 MED ORDER — FUROSEMIDE 20 MG PO TABS: 20.0000 mg | ORAL_TABLET | Freq: Every day | ORAL | Status: DC | PRN

## 2011-08-02 NOTE — Telephone Encounter (Signed)
Patient is calling because [facility] on [address] did not receive the Rx for Lasix so she would like it resent to [facility].

## 2011-08-02 NOTE — Progress Notes (Signed)
  Subjective:    Patient ID: [name], female    DOB: [birth year], 51 y.o.   MRN: [id number]  HPI 51 y/o female is referred to sports medicine clinic for bilateral plantar fasciitis.  She has bilateral heel pain that is worse with walking.  She has tried OTC analgesics as well as ice without improvement.   Review of Systems     Objective:   Physical Exam  Normal inspection with no visable or palpable fat pad atrophy and no visible swelling/erythema. Patient is tender at medial insertion of plantar fascia into calcaneus. Otherwise there is no tenderness to palpation of the foot Gait is normal      Assessment & Plan:

## 2011-08-02 NOTE — Telephone Encounter (Signed)
Rx resent and pts husband informed. Jarl Sellitto, Maryjo Rochester

## 2011-08-02 NOTE — Assessment & Plan Note (Signed)
Temporary orthotics HEP and icing

## 2011-08-26 ENCOUNTER — Ambulatory Visit (INDEPENDENT_AMBULATORY_CARE_PROVIDER_SITE_OTHER): Payer: PRIVATE HEALTH INSURANCE | Admitting: Sports Medicine

## 2011-08-26 VITALS — BP 153/86 | Ht 63.0 in | Wt 175.0 lb

## 2011-08-26 DIAGNOSIS — M76829 Posterior tibial tendinitis, unspecified leg: Secondary | ICD-10-CM

## 2011-08-26 NOTE — Progress Notes (Signed)
  Subjective:    Patient ID: [name], female    DOB: 1960-06-03, 51 y.o.   MRN: [id number]  HPI Chief complaint: Bilateral foot pain  Patient comes in today for followup. Still having pain in each of her ankles. Pain seems to be more along the posterior tibialis tendon more so than at the calcaneal insertion of the plantar fascia. She's noticed swelling with standing. She received a pair of sports insoles and navicular pad one month ago. He thinks that these have been helpful. She's also been doing some simple plantar fascial stretches but no he eccentric exercises.   Review of Systems     Objective:   Physical Exam Examination of each of her feet shows moderate pes planus and pronation with walking. She is tender and swollen along the posterior tibialis tendon just proximal to its insertion onto the navicular. Pain with standing on tiptoes. Some pain over the calcaneal insertion of the plantar fascia. Negative calcaneal squeeze. She is neurovascular intact distally      Assessment & Plan:  #1. Bilateral heel pain likely secondary to posterior tibialis tendinitis  She'll continue with her current orthosis she will return to the office in 4 weeks for custom orthotics. I would like to get her symptoms under a little better control before we fit her with custom orthotics. I will prescribe her a topical anti-inflammatory to use. She's also instructed in eccentric exercises for her posterior tibialis tendon.

## 2011-09-23 ENCOUNTER — Ambulatory Visit: Payer: PRIVATE HEALTH INSURANCE | Admitting: Sports Medicine

## 2011-10-14 ENCOUNTER — Ambulatory Visit (INDEPENDENT_AMBULATORY_CARE_PROVIDER_SITE_OTHER): Payer: PRIVATE HEALTH INSURANCE | Admitting: Sports Medicine

## 2011-10-14 ENCOUNTER — Other Ambulatory Visit: Payer: Self-pay | Admitting: *Deleted

## 2011-10-14 ENCOUNTER — Encounter: Payer: Self-pay | Admitting: Sports Medicine

## 2011-10-14 VITALS — BP 130/76 | HR 71 | Ht 63.0 in | Wt 221.0 lb

## 2011-10-14 DIAGNOSIS — M6789 Other specified disorders of synovium and tendon, multiple sites: Secondary | ICD-10-CM

## 2011-10-14 DIAGNOSIS — M76829 Posterior tibial tendinitis, unspecified leg: Secondary | ICD-10-CM

## 2011-10-14 MED ORDER — NABUMETONE 750 MG PO TABS: 750.0000 mg | ORAL_TABLET | Freq: Two times a day (BID) | ORAL | Status: DC

## 2011-10-14 MED ORDER — METFORMIN HCL 1000 MG PO TABS: 500.0000 mg | ORAL_TABLET | Freq: Every day | ORAL | Status: DC

## 2011-10-14 MED ORDER — TRAMADOL HCL 50 MG PO TABS: 50.0000 mg | ORAL_TABLET | Freq: Three times a day (TID) | ORAL | Status: DC | PRN

## 2011-10-14 NOTE — Progress Notes (Signed)
  Subjective:    Patient ID: [name], female    DOB: 1960-12-21, 51 y.o.   MRN: [id number]  HPI [name] comes in today for orthotics. She's noticed about 50% improvement with her sports insoles and scaphoid pads. Topical anti-inflammatory has been ineffective. She has a history of posterior tibialis tendinitis/insufficiency. She states that she has been compliant with her home exercise program.    Review of Systems     Objective:   Physical Exam Each of her feet shows mild swelling along the course of the posterior tibialis tendon just proximal to its insertion on the navicular. Markedly pes planus with standing. Slight hindfoot valgus on the right. Pronation with walking. Neurovascular intact distally.       Assessment & Plan:  1. Bilateral posterior tibialis tendinitis/insufficiency  We constructed custom orthotics for the patient today. She found this to be quite comfortable prior to leaving the office. I will place her on Relafen 750 mg twice a day with food for 3 weeks and I've given her a prescription for Ultram for more severe pain. She will continue with her eccentric home exercise program and will followup in 3 weeks.  Patient was fitted for a : standard, cushioned, semi-rigid orthotic. The orthotic was heated and afterward the patient stood on the orthotic blank positioned on the orthotic stand. The patient was positioned in subtalar neutral position and 10 degrees of ankle dorsiflexion in a weight bearing stance. After completion of molding, a stable base was applied to the orthotic blank. The blank was ground to a stable position for weight bearing. Size:8 Base: [employer] and Padding:none

## 2011-10-14 NOTE — Telephone Encounter (Signed)
Pt's metformin re-ordered for 2 month supply; pt should return in 1-2 months for A1c recheck.' Please call pt to inform that I re-ordered her metformin and to schedule return appointment in 1-2 months for A1c recheck.

## 2011-11-12 ENCOUNTER — Encounter: Payer: Self-pay | Admitting: Family Medicine

## 2011-11-12 ENCOUNTER — Ambulatory Visit (INDEPENDENT_AMBULATORY_CARE_PROVIDER_SITE_OTHER): Payer: PRIVATE HEALTH INSURANCE | Admitting: Family Medicine

## 2011-11-12 VITALS — BP 118/77 | HR 91 | Temp 97.9°F | Ht 63.0 in | Wt 219.0 lb

## 2011-11-12 DIAGNOSIS — Z862 Personal history of diseases of the blood and blood-forming organs and certain disorders involving the immune mechanism: Secondary | ICD-10-CM

## 2011-11-12 DIAGNOSIS — R7309 Other abnormal glucose: Secondary | ICD-10-CM

## 2011-11-12 DIAGNOSIS — Z0000 Encounter for general adult medical examination without abnormal findings: Secondary | ICD-10-CM

## 2011-11-12 DIAGNOSIS — D6949 Other primary thrombocytopenia: Secondary | ICD-10-CM

## 2011-11-12 DIAGNOSIS — I10 Essential (primary) hypertension: Secondary | ICD-10-CM

## 2011-11-12 DIAGNOSIS — E785 Hyperlipidemia, unspecified: Secondary | ICD-10-CM

## 2011-11-12 DIAGNOSIS — R5381 Other malaise: Secondary | ICD-10-CM

## 2011-11-12 DIAGNOSIS — Z23 Encounter for immunization: Secondary | ICD-10-CM

## 2011-11-12 DIAGNOSIS — R7303 Prediabetes: Secondary | ICD-10-CM

## 2011-11-12 DIAGNOSIS — R5383 Other fatigue: Secondary | ICD-10-CM | POA: Insufficient documentation

## 2011-11-12 MED ORDER — METFORMIN HCL 1000 MG PO TABS: 500.0000 mg | ORAL_TABLET | Freq: Every day | ORAL | Status: DC

## 2011-11-12 MED ORDER — PRAVASTATIN SODIUM 40 MG PO TABS: 40.0000 mg | ORAL_TABLET | Freq: Every evening | ORAL | Status: DC

## 2011-11-12 NOTE — Assessment & Plan Note (Signed)
-   Last lipids LDL 112 and cholesterol 210 on 03/2011 - Start pravastatin 40mg  daily - F/u lipids in 3 months

## 2011-11-12 NOTE — Assessment & Plan Note (Addendum)
A1c today 6.0 - prediabetes - Encouraged decreasing soda intake as step toward losing weight. - Continue lowered dose of metformin 500mg  daily - Check A1c ev/ 3-6 months

## 2011-11-12 NOTE — Assessment & Plan Note (Signed)
Likely due to needing new glasses; also likely contributing to headaches - Get new glasses that were prescribed - F/u CBC, Ca, Vit D, TSH, and BMET ordered today

## 2011-11-12 NOTE — Assessment & Plan Note (Signed)
>>  ASSESSMENT AND PLAN FOR DM (DIABETES MELLITUS) WITH COMPLICATIONS (HCC) WRITTEN ON 11/12/2011  6:28 PM BY THEKKEKANDAM, MARIA T, MD  A1c today 6.0 - prediabetes - Encouraged decreasing soda intake as step toward losing weight. - Continue lowered dose of metformin  500mg  daily - Check A1c ev/ 3-6 months

## 2011-11-12 NOTE — Assessment & Plan Note (Addendum)
-   No bleeds. - PLT 97 in 07/2011; F/u today's CBC - Recommended no ibuprofen for pain

## 2011-11-12 NOTE — Patient Instructions (Signed)
It was good to see you today. - For your diabetes, we checked an A1c. Continue taking metformin unless I call and let you know otherwise. - For your high cholesterol, please start taking pravastatin 40 mg daily.  Follow up with me in 3 months.  If you start having leg or other muscle cramping, let me know and stop taking the pravastatin. - For your high blood pressure, we are continuing your coreg and norvasc.   - For your leg swelling, we are stopping your lasix - for the next two weeks, take 1/2 tablet daily and then after that stop taking it.  This will hopefully help your leg cramping.  Keep walking as you can and as Dr. Margaretha Sheffield allows, and keep raising your legs when you can. - For your health maintenance, please call and schedule a colonoscopy.  You are getting a flu shot today. - For your fatigue, I think getting your new glasses will greatly help.  Please also try drinking 1 less soda drink per day.  I will call you if any of the labs we got today are abnormal.  Thanks and take care!  If you develop any concerning symptoms like chest pain, shortness of breath, confusion, dizziness, please call 911 and go to the Emergency Room.

## 2011-11-12 NOTE — Progress Notes (Signed)
Subjective:     Patient ID: [name], female   DOB: 09-20-1960, 51 y.o.   MRN: [id number]  CC - A1c f/u and complaint of feeling tired.  HPI  This is a 51 y.o. female with h/o DM here for f/u and with complaint of feeling tired.    DM - Pt had A1c last checked in 07/2011 and it was 6.1.  Baseline has been around 6.  Tired - Pt states for 1 month she has felt restless at night and has difficult time sleeping, making her tired when awake.  Notes headache after takes tamoxifen, rhinorrhea and dry cough 1 week ago at which point fatigue was worse, and slow vision change with prescription for new glasses that she has not gotten around to getting yet. Denies muscle weakness, difficulty with daily activities, dizziness, loss of consciousness, fevers, chills, blood loss, acute vision changes.  Review of Systems Reports ankle swelling bilaterally, leg and hand cramps, nodule on right pointer finger that is not painful for 2-3 months.  PMH and SH reviewed and in History tab.    Objective:   Physical Exam BP 118/77  Pulse 91  Temp 97.9 F (36.6 C) (Oral)  Ht 5\' 3"  (1.6 m)  Wt 219 lb (99.338 kg)  BMI 38.79 kg/m2     Assessment:     This is a 51 y.o. female with h/o DM here for f/u and with complaint of feeling tired.       Plan:

## 2011-11-12 NOTE — Assessment & Plan Note (Addendum)
-   Vitamin D and Calcium levels today

## 2011-11-12 NOTE — Assessment & Plan Note (Signed)
Controlled on coreg and norvasc - Taper down lasix over 2 weeks given leg and arm cramping - Raise legs and ambulate frequently for leg swelling - F/u in 3 months, consider compression stockings - F/u today's BMET

## 2011-11-13 LAB — CBC
HCT: 39.3 % (ref 36.0–46.0)
Hemoglobin: 13.3 g/dL (ref 12.0–15.0)
MCH: 25.1 pg — ABNORMAL LOW (ref 26.0–34.0)
MCHC: 33.8 g/dL (ref 30.0–36.0)
MCV: 74.2 fL — ABNORMAL LOW (ref 78.0–100.0)
RDW: 14.8 % (ref 11.5–15.5)

## 2011-11-13 LAB — BASIC METABOLIC PANEL
CO2: 28 mEq/L (ref 19–32)
Calcium: 9.6 mg/dL (ref 8.4–10.5)
Chloride: 106 mEq/L (ref 96–112)
Glucose, Bld: 101 mg/dL — ABNORMAL HIGH (ref 70–99)
Potassium: 4.6 mEq/L (ref 3.5–5.3)
Sodium: 142 mEq/L (ref 135–145)

## 2011-11-15 ENCOUNTER — Telehealth: Payer: Self-pay | Admitting: Family Medicine

## 2011-11-15 DIAGNOSIS — C50919 Malignant neoplasm of unspecified site of unspecified female breast: Secondary | ICD-10-CM

## 2011-11-15 DIAGNOSIS — D6949 Other primary thrombocytopenia: Secondary | ICD-10-CM

## 2011-11-15 DIAGNOSIS — I10 Essential (primary) hypertension: Secondary | ICD-10-CM

## 2011-11-15 DIAGNOSIS — D693 Immune thrombocytopenic purpura: Secondary | ICD-10-CM

## 2011-11-15 MED ORDER — CARVEDILOL 25 MG PO TABS: 25.0000 mg | ORAL_TABLET | Freq: Two times a day (BID) | ORAL | Status: DC

## 2011-11-15 MED ORDER — AMLODIPINE BESYLATE 10 MG PO TABS: 10.0000 mg | ORAL_TABLET | Freq: Every day | ORAL | Status: DC

## 2011-11-15 MED ORDER — VENLAFAXINE HCL ER 37.5 MG PO CP24: 37.5000 mg | ORAL_CAPSULE | Freq: Every day | ORAL | Status: DC

## 2011-11-15 NOTE — Telephone Encounter (Signed)
Patient is calling for her medications to all be sent to [facility] on [address].  She is no longer using [facility].  Also, she is out of her Blood Pressure medication.

## 2011-11-18 ENCOUNTER — Telehealth: Payer: Self-pay | Admitting: Family Medicine

## 2011-11-18 NOTE — Telephone Encounter (Signed)
Patient is calling to get her lab results.

## 2011-11-19 ENCOUNTER — Other Ambulatory Visit: Payer: Self-pay | Admitting: Emergency Medicine

## 2011-11-19 DIAGNOSIS — D6949 Other primary thrombocytopenia: Secondary | ICD-10-CM

## 2011-11-19 DIAGNOSIS — D693 Immune thrombocytopenic purpura: Secondary | ICD-10-CM

## 2011-11-19 DIAGNOSIS — C50919 Malignant neoplasm of unspecified site of unspecified female breast: Secondary | ICD-10-CM

## 2011-11-19 MED ORDER — VENLAFAXINE HCL ER 37.5 MG PO CP24: 37.5000 mg | ORAL_CAPSULE | Freq: Every day | ORAL | Status: DC

## 2011-11-19 MED ORDER — TAMOXIFEN CITRATE 20 MG PO TABS: 20.0000 mg | ORAL_TABLET | Freq: Every day | ORAL | Status: DC

## 2011-11-19 NOTE — Telephone Encounter (Signed)
It looks like this has been taken care of - please let me know if there is anything further for me to do. Thanks.

## 2011-11-20 ENCOUNTER — Encounter: Payer: Self-pay | Admitting: Family Medicine

## 2011-11-20 ENCOUNTER — Telehealth: Payer: Self-pay | Admitting: Family Medicine

## 2011-11-20 NOTE — Telephone Encounter (Signed)
Called and told patient results, and created letter to be sent to patient. Thanks!  Byrd Hesselbach

## 2011-11-20 NOTE — Progress Notes (Signed)
Unsure why this never resulted but there is a calcium result in the metabolic panel, and it is normal.   [name] 11/20/2011 8:16 AM

## 2011-11-20 NOTE — Telephone Encounter (Signed)
Patient wanted to be made aware of lab results from most recent visit.  Called and told patient results were normal, and PLTs were slightly low but within her baseline range.  Pt also wanted letter with lab results sent to her so will create that now and have sent.  Told patient to continue with current medication regimen.  [name] 11/20/2011 8:06 AM

## 2011-12-24 ENCOUNTER — Ambulatory Visit: Payer: PRIVATE HEALTH INSURANCE | Admitting: Family Medicine

## 2012-01-15 ENCOUNTER — Ambulatory Visit (INDEPENDENT_AMBULATORY_CARE_PROVIDER_SITE_OTHER): Payer: BC Managed Care – PPO | Admitting: Sports Medicine

## 2012-01-15 VITALS — BP 150/93 | Ht 63.0 in | Wt 179.0 lb

## 2012-01-15 DIAGNOSIS — M7740 Metatarsalgia, unspecified foot: Secondary | ICD-10-CM

## 2012-01-15 DIAGNOSIS — M2140 Flat foot [pes planus] (acquired), unspecified foot: Secondary | ICD-10-CM

## 2012-01-15 DIAGNOSIS — M7750 Other enthesopathy of unspecified foot: Secondary | ICD-10-CM

## 2012-01-15 DIAGNOSIS — M76829 Posterior tibial tendinitis, unspecified leg: Secondary | ICD-10-CM

## 2012-01-15 MED ORDER — TRAMADOL HCL 50 MG PO TABS: 50.0000 mg | ORAL_TABLET | Freq: Three times a day (TID) | ORAL | Status: DC | PRN

## 2012-01-15 NOTE — Progress Notes (Signed)
  Subjective:    Patient ID: [name], female    DOB: 07/24/1960, 51 y.o.   MRN: [id number]  HPI Patient comes in today complaining of bilateral ankle and foot pain. She was make custom orthotics at her last visit which are somewhat comfortable. Most of her pain is along the posterior tibialis tendon in each ankle. She's noticed more pain with standing and walking. She is also beginning to experience pain along the metatarsal region along the plantar aspect of the right foot. She did not bring her orthotics with her today. Not suffered any new injury.    Review of Systems     Objective:   Physical Exam Each ankle shows moderate swelling along the course of the posterior tibialis tendon. Pain with standing on her tiptoes. She is tender to palpation along the posterior tibialis tendon just posterior to the medial malleolus. No tenderness at the insertion on the navicular. Significant pes planus with standing. There is diffuse metatarsal pain to palpation along the plantar aspect of the right foot. She walks with a noticeable limp.  MSK ultrasound of the right ankle: Limited scan focusing on the medial ankle was done. Images obtained in both long and transverse views. There is hypoechoic fluid surrounding the posterior tibialis tendon but no discrete tear is seen.       Assessment & Plan:  1. Bilateral pes planus with posterior tibialis tendinitis/insufficiency 2. Right foot metatarsalgia  Body helix compression sleeve was provided. I've given her 1 sleeve and if she finds it beneficial we can provide her with another. I've asked her to return to the office with her orthotics sometime over the next couple of days. We will fit him with bilateral metatarsal pads. Followup in 3 weeks. Of note, she is unable to take NSAIDs.

## 2012-01-25 ENCOUNTER — Telehealth: Payer: Self-pay | Admitting: *Deleted

## 2012-01-25 NOTE — Telephone Encounter (Signed)
Per written orders moved patient appointment to 02-21-2012 to the NP schedule

## 2012-02-06 ENCOUNTER — Ambulatory Visit (INDEPENDENT_AMBULATORY_CARE_PROVIDER_SITE_OTHER): Payer: BC Managed Care – PPO | Admitting: Family Medicine

## 2012-02-06 VITALS — BP 138/81 | HR 71 | Temp 98.7°F | Ht 63.0 in | Wt 216.0 lb

## 2012-02-06 DIAGNOSIS — I10 Essential (primary) hypertension: Secondary | ICD-10-CM | POA: Diagnosis not present

## 2012-02-06 DIAGNOSIS — R7309 Other abnormal glucose: Secondary | ICD-10-CM | POA: Diagnosis not present

## 2012-02-06 DIAGNOSIS — M25549 Pain in joints of unspecified hand: Secondary | ICD-10-CM | POA: Insufficient documentation

## 2012-02-06 DIAGNOSIS — E785 Hyperlipidemia, unspecified: Secondary | ICD-10-CM | POA: Diagnosis not present

## 2012-02-06 DIAGNOSIS — R7303 Prediabetes: Secondary | ICD-10-CM

## 2012-02-06 DIAGNOSIS — M1990 Unspecified osteoarthritis, unspecified site: Secondary | ICD-10-CM

## 2012-02-06 MED ORDER — DICLOFENAC SODIUM 1 % TD GEL: 2.0000 g | Freq: Four times a day (QID) | TRANSDERMAL | Status: DC | PRN

## 2012-02-06 MED ORDER — AMLODIPINE BESYLATE 5 MG PO TABS: 5.0000 mg | ORAL_TABLET | Freq: Every day | ORAL | Status: DC

## 2012-02-06 NOTE — Patient Instructions (Addendum)
It was good to see you today.  - I am prescribing an NSAID (antiinflammatory) cream for your hands - use this as needed.  You don't have to use it daily.  I think you have arthritis.  You can also continue tylenol and try bengay and capsacin.  - I am also reducing your blood pressure medicine called amlodipine (norvasc) to 5mg , which is 1/2 of your 10 mg tablet.  Keep an eye on if this makes you feel your legs are less swollen. - I want you to come back in about a week in the morning before you've had any breakfast or coffee to get some labs drawn to check your potassium, hemoglobin A1c (blood sugar control) and cholesterol.   Come for lab draw in 2 weeks and BP check.  See me in about 1 month to discuss, or sooner if needed.

## 2012-02-06 NOTE — Progress Notes (Signed)
Subjective:     Patient ID: [name], female   DOB: [birth year], 52 y.o.   MRN: [id number]  CC: hand pain  HPI  [name] is a 52 y.o. female with h/o thrombocytopenia, prediabetes, HTN, HLD with 2 weeks of right hand cramping and joint pain.  Pt states that her right hand fingers painfully cramp upwards occasionally with inability to bend them downwards for 15 - 20 minutes.   She rubs them and tries eating mustard which helps.  She also complains that her right pointer has a painful hard bone at the last joint.  Occasionally, her whole body also gets stiff to the point where she cannot walk.  Her left hand is sore at the last joints with 6/10 pain, especially her pointer and her wrist also hurts and is stiff.  Stiffness and joint pains are worse in the morning and get better throughout the day.  Cold water makes symptoms worse.  Tylenol does not help.  Patient does not like tramadol as it makes her feel "out of it."  Because of cramping symptoms at the last visit, stopped lasix.  Patient feels legs are heavy now.   Review of Systems Denies CP, SOB, vision or hearing changes       Objective:   Physical Exam BP 138/81  Pulse 71  Temp 98.7 F (37.1 C)  Ht 5\' 3"  (1.6 m)  Wt 216 lb (97.977 kg)  BMI 38.26 kg/m2 GEN: NAD, pleasant CV: RRR with II/VI systolic murmur MSK/EXTR:  Right pointer tender at PIP and thenar eminence with firm 1/2 cm protrusion at DIP.  Left pointer TTP at DIP and left thenar eminence tender No erythema, swelling, warmth.  Both hands appear plump but may be patient's baseline Bilaterally full ROM in hands and wrists    Assessment:     [name] is a 52 y.o. female with h/o thrombocytopenia, prediabetes, HTN, HLD with 2 weeks of right hand cramping and joint pain, most likely degenerative joint disease given location of pain and stiffness and improvement as the day progresses.    Plan:     # Health maintenance - Pneumococcal shot at f/u  visit - F/u if patient able to get colonoscopy (pt has BCBS so should not need referral)

## 2012-02-07 ENCOUNTER — Telehealth: Payer: Self-pay | Admitting: *Deleted

## 2012-02-07 NOTE — Telephone Encounter (Signed)
PA required fort Voltaren . Form placed in MD box.

## 2012-02-07 NOTE — Telephone Encounter (Signed)
PA form faxed to Insurance.

## 2012-02-08 NOTE — Assessment & Plan Note (Signed)
-   Recheck lipids in 1-2 weeks as lab draw

## 2012-02-08 NOTE — Assessment & Plan Note (Signed)
Hand pain most likely DJD given location, stiffness, improvement as the day goes on, likely right-sided pointer finger DIP pone spur, and lack of fever/signs of infection. - Patient cannot take NSAIDs systemically due to chronic thrombocytopenia.  Prescribed and filled out prior authorization for voltaren gel to be used topically at joints. - Recommended patient can also try capsaicin and bengay. - Recheck BMET in 1-2 weeks (ordered as "future" status) to make sure not cramps from hypokalemia

## 2012-02-08 NOTE — Assessment & Plan Note (Signed)
-   Recheck A1c in 1-2 weeks (lab draw visit)

## 2012-02-08 NOTE — Assessment & Plan Note (Signed)
>>  ASSESSMENT AND PLAN FOR DM (DIABETES MELLITUS) WITH COMPLICATIONS (HCC) WRITTEN ON 02/08/2012 10:30 PM BY THEKKEKANDAM, MARIA T, MD  - Recheck A1c in 1-2 weeks (lab draw visit)

## 2012-02-08 NOTE — Assessment & Plan Note (Addendum)
-   Decrease norvasc from 10mg  daily to 5 mg daily to see if helps with feeling of heaviness in LE - Venlafaxine can worsen BP - consider changing to another antidepressant agent if BP uncontrolled with lowering of norvasc - Recheck BP in 1-2 weeks - F/u in 1 month

## 2012-02-11 NOTE — Telephone Encounter (Signed)
Approval received ,for Voltaren. Pharmacy notified. Pharmacy states cost is $48.00 and insurance will pay  $10.00.  He will notify patient.

## 2012-02-12 ENCOUNTER — Ambulatory Visit: Payer: BC Managed Care – PPO | Admitting: Sports Medicine

## 2012-02-20 ENCOUNTER — Telehealth: Payer: Self-pay | Admitting: *Deleted

## 2012-02-20 NOTE — Telephone Encounter (Signed)
Patient confirmed over the phone the new time but same day

## 2012-02-21 ENCOUNTER — Other Ambulatory Visit (HOSPITAL_BASED_OUTPATIENT_CLINIC_OR_DEPARTMENT_OTHER): Payer: BC Managed Care – PPO | Admitting: Lab

## 2012-02-21 ENCOUNTER — Ambulatory Visit (HOSPITAL_BASED_OUTPATIENT_CLINIC_OR_DEPARTMENT_OTHER): Payer: BC Managed Care – PPO | Admitting: Physician Assistant

## 2012-02-21 ENCOUNTER — Ambulatory Visit: Payer: PRIVATE HEALTH INSURANCE | Admitting: Oncology

## 2012-02-21 ENCOUNTER — Telehealth: Payer: Self-pay | Admitting: *Deleted

## 2012-02-21 ENCOUNTER — Encounter: Payer: Self-pay | Admitting: Physician Assistant

## 2012-02-21 ENCOUNTER — Other Ambulatory Visit: Payer: PRIVATE HEALTH INSURANCE | Admitting: Lab

## 2012-02-21 VITALS — BP 155/89 | HR 91 | Temp 98.5°F | Resp 20 | Ht 63.0 in | Wt 217.7 lb

## 2012-02-21 DIAGNOSIS — D693 Immune thrombocytopenic purpura: Secondary | ICD-10-CM

## 2012-02-21 DIAGNOSIS — M79609 Pain in unspecified limb: Secondary | ICD-10-CM | POA: Diagnosis not present

## 2012-02-21 DIAGNOSIS — D6949 Other primary thrombocytopenia: Secondary | ICD-10-CM

## 2012-02-21 DIAGNOSIS — C50319 Malignant neoplasm of lower-inner quadrant of unspecified female breast: Secondary | ICD-10-CM | POA: Diagnosis not present

## 2012-02-21 DIAGNOSIS — C50919 Malignant neoplasm of unspecified site of unspecified female breast: Secondary | ICD-10-CM

## 2012-02-21 DIAGNOSIS — M79672 Pain in left foot: Secondary | ICD-10-CM | POA: Insufficient documentation

## 2012-02-21 DIAGNOSIS — M79671 Pain in right foot: Secondary | ICD-10-CM

## 2012-02-21 LAB — CBC WITH DIFFERENTIAL/PLATELET
BASO%: 0.5 % (ref 0.0–2.0)
EOS%: 2 % (ref 0.0–7.0)
HCT: 40.6 % (ref 34.8–46.6)
LYMPH%: 33.8 % (ref 14.0–49.7)
MCH: 25 pg — ABNORMAL LOW (ref 25.1–34.0)
MCHC: 32.3 g/dL (ref 31.5–36.0)
MCV: 77.6 fL — ABNORMAL LOW (ref 79.5–101.0)
MONO#: 0.4 10*3/uL (ref 0.1–0.9)
MONO%: 7.3 % (ref 0.0–14.0)
NEUT%: 56.4 % (ref 38.4–76.8)
Platelets: 105 10*3/uL — ABNORMAL LOW (ref 145–400)
RBC: 5.23 10*6/uL (ref 3.70–5.45)
WBC: 5.5 10*3/uL (ref 3.9–10.3)
nRBC: 0 % (ref 0–0)

## 2012-02-21 LAB — COMPREHENSIVE METABOLIC PANEL (CC13)
ALT: 31 U/L (ref 0–55)
AST: 26 U/L (ref 5–34)
BUN: 8 mg/dL (ref 7.0–26.0)
Calcium: 9.5 mg/dL (ref 8.4–10.4)
Chloride: 105 mEq/L (ref 98–107)
Creatinine: 0.9 mg/dL (ref 0.6–1.1)
Total Bilirubin: 0.33 mg/dL (ref 0.20–1.20)

## 2012-02-21 NOTE — Progress Notes (Signed)
ID: [name]   DOB: August 29, 1960  MR#: [id number]  JXB#:[id number]  PCP: Simone Curia, MD  DIAGNOSIS: 52 year old female with invasive ductal carcinoma of the right breast originally diagnosed in June 2011. With subsequent subcutaneous recurrence   PRIOR THERAPY:  #1 patient initially underwent a right mastectomy in June 2011 with the final pathology revealing 2 foci of invasive ductal carcinoma, comment 1 measuring 2.2 cm and a second one measuring 0.5 cm. The tumor was ER positive PR positive HER-2/neu negative with a proliferation marker 11%.  #2 the patient had Oncotype DX testing done that revealed recurrence score of 27 with 18% her risk of distant recurrence with tamoxifen alone.  #3 patient had adjuvant chemotherapy consisting of Taxotere and Cytoxan every 3 weeks for a total of 4 cycles from 10/23/2009 2 12/25/2009.  #4 patient had reconstruction performed by Dr. Etter Sjogren.  #5 she was then begun on tamoxifen 20 mg daily in January 2012. Number  #6 patient then developed a subcutaneous nodule at the site of her original mastectomy scar. This was resected. She then went on to have radiation therapy locally to this to the mastectomy site.  #7 patient continues on tamoxifen 20 mg daily.  #8 Effexor XR 37.5 mg daily for hot flashes   CURRENT THERAPY: Tamoxifen 20 mg daily  ECOG PERFORMANCE STATUS: 1 - Symptomatic but completely ambulatory  INTERVAL HISTORY:[name] 52 y.o. female returns for a follow up appointment.  SHe is doing well except for intermittent hot flashes improved with Effexor. She has some intermittent joint pain, although able to perform her ADLs. Ptient has been evaluated at the [facility] for bilateral foot pain due to tendonitis in the setting of  metatarsalgia, improved with sleeves and  Orthotics pads.Patient is due for colonoscopy at [facility] , which she is to schedule sometime this Spring. Denies any nausea, vomiting, fatigue, vaginal  discharge, new masses during self exam, respiratory or cardiac complaints. No xerostomia.    REVIEW OF SYSTEMS: Remainder of the 10 point ROS is negative.  PAST MEDICAL HISTORY: Past Medical History  Diagnosis Date  . Hypertension   . Hyperlipidemia   . Thrombocytopenia   . Diabetes mellitus   . Breast CA     PAST SURGICAL HISTORY: Past Surgical History  Procedure Date  . Thyroidectomy   . Breast surgery 09/12/10    excision of skin mass - right breast  . Parathyroidectomy     partial?  . Abdominal hysterectomy     1996 per pt    FAMILY HISTORY Family History  Problem Relation Age of Onset  . Heart disease Mother   . Stroke Father   . Heart disease Brother     GHEALTH MAINTENANCE: History  Substance Use Topics  . Smoking status: Never Smoker   . Smokeless tobacco: Never Used  . Alcohol Use: No      No Known Allergies  Current Outpatient Prescriptions  Medication Sig Dispense Refill  . acetaminophen (TYLENOL) 500 MG tablet Take 500 mg by mouth as needed.       Marland Kitchen amLODipine (NORVASC) 5 MG tablet Take 1 tablet (5 mg total) by mouth daily.  30 tablet  11  . CALCIUM PO Take 2,000 mg by mouth 2 (two) times daily. To take it bid for five days, then daily thereafter.      . carvedilol (COREG) 25 MG tablet Take 1 tablet (25 mg total) by mouth 2 (two) times daily with a meal.  60 tablet  11  . cetirizine-pseudoephedrine (ZYRTEC-D) 5-120 MG per tablet Take 1 tablet by mouth daily.  30 tablet  0  . cholecalciferol (VITAMIN D) 1000 UNITS tablet Take 2,000 Units by mouth daily.        . diclofenac sodium (VOLTAREN) 1 % GEL Apply 2 g topically 4 (four) times daily as needed. For up to 1 week.  1 Tube  0  . Hydrocodone-Acetaminophen (VICODIN PO) Take by mouth as needed.        . metFORMIN (GLUCOPHAGE) 1000 MG tablet Take 0.5 tablets (500 mg total) by mouth daily with breakfast.  30 tablet  2  . nabumetone (RELAFEN) 750 MG tablet Take 1 tablet (750 mg total) by mouth 2 (two)  times daily.  60 tablet  1  . pravastatin (PRAVACHOL) 40 MG tablet Take 1 tablet (40 mg total) by mouth every evening.  30 tablet  2  . tamoxifen (NOLVADEX) 20 MG tablet Take 1 tablet (20 mg total) by mouth daily.  30 tablet  4  . traMADol (ULTRAM) 50 MG tablet Take 1 tablet (50 mg total) by mouth every 8 (eight) hours as needed for pain.  60 tablet  0  . venlafaxine XR (EFFEXOR-XR) 37.5 MG 24 hr capsule Take 1 capsule (37.5 mg total) by mouth daily.  30 capsule  3    OBJECTIVE: Filed Vitals:   02/21/12 0827  BP: 155/89  Pulse: 91  Temp: 98.5 F (36.9 C)  Resp: 20     Body mass index is 38.56 kg/(m^2).       GENERAL: Well developed, well nourished, in no acute distress.  EENT: No ocular or oral lesions. No stomatitis.  RESPIRATORY: Lungs are clear to auscultation bilaterally, no wheezing, rhonchi or rales. CARDIAC: regular rate and rhythm, no murmurs, rubs or gallops. GI: Abdomen is soft, non tender,  no palpable hepatosplenomegaly. No fluid wave. Musculoskeletal:No focal spinal tenderness, no peripheral edema.bilateral foot discomfort [facility]. Lymph: No cervical, infraclavicular, axillary or inguinal adenopathy. Neuro: No focal neurological deficits. Psych: Alert and oriented X 3, appropriate mood and affect.  BREAST EXAM: Left breast no masses or nipple discharge no skin changes. Right mastectomy scar looks well healed there is changes from radiation otherwise no masses no nodularity.  LAB RESULTS: Lab Results  Component Value Date   WBC 5.5 02/21/2012   NEUTROABS 3.1 02/21/2012   HGB 13.1 02/21/2012   HCT 40.6 02/21/2012   MCV 77.6* 02/21/2012   PLT 105* 02/21/2012      Chemistry      Component Value Date/Time   NA 141 02/21/2012 0806   NA 142 11/12/2011 1504   K 3.7 02/21/2012 0806   K 4.6 11/12/2011 1504   CL 105 02/21/2012 0806   CL 106 11/12/2011 1504   CO2 27 02/21/2012 0806   CO2 28 11/12/2011 1504   BUN 8.0 02/21/2012 0806   BUN 8 11/12/2011 1504    CREATININE 0.9 02/21/2012 0806   CREATININE 0.87 11/12/2011 1504   CREATININE 0.82 07/22/2011 0801      Component Value Date/Time   CALCIUM 9.5 02/21/2012 0806   CALCIUM 9.6 11/12/2011 1504   ALKPHOS 78 02/21/2012 0806   ALKPHOS 42 07/22/2011 0801   AST 26 02/21/2012 0806   AST 28 07/22/2011 0801   ALT 31 02/21/2012 0806   ALT 36* 07/22/2011 0801   BILITOT 0.33 02/21/2012 0806   BILITOT 0.4 07/22/2011 0801       Lab Results  Component Value Date   LABCA2 28 09/18/2010    Urinalysis    Component Value Date/Time   COLORURINE YELLOW 07/17/2009 1512   APPEARANCEUR CLEAR 07/17/2009 1512   LABSPEC 1.018 07/17/2009 1512   PHURINE 5.5 07/17/2009 1512   GLUCOSEU NEGATIVE 07/17/2009 1512   HGBUR TRACE* 07/17/2009 1512   BILIRUBINUR NEG 05/17/2011 1537   BILIRUBINUR NEGATIVE 07/17/2009 1512   KETONESUR NEGATIVE 07/17/2009 1512   PROTEINUR NEGATIVE 07/17/2009 1512   UROBILINOGEN 2.0 05/17/2011 1537   UROBILINOGEN 0.2 07/17/2009 1512   NITRITE NEG 05/17/2011 1537   NITRITE NEGATIVE 07/17/2009 1512   LEUKOCYTESUR Negative 05/17/2011 1537    STUDIES: No results found.  Filed Weights   02/21/12 0827  Weight: 217 lb 11.2 oz (98.748 kg)    ASSESSMENT/ PLAN: 88 year oldd female with #1 right breast cancer status post mastectomy with subsequent local recurrence which was resected followed by radiation therapy.She is continuing tamoxifen 20 mg daily. She is to return in a follow up visit in 6 months with labs.  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 No bleeding issues  #3 hot flashes patient is on Effexor XR 37.5 mg on a daily basis, improved symptoms.  #4 The patient is to undergo a routine colonoscopy in the near future.  All questions were answered. The patient knows to call the clinic with any problems, questions or concerns. We can certainly see the patient much sooner if necessary.  Time spent 25 minutes counseling the patient face to face. The total time spent in the appointment was 30  minutes.    Marlowe Kays E    02/21/2012

## 2012-02-21 NOTE — Patient Instructions (Signed)
You are doing well. Remain active.  Eat foods rich in nutrients.   You are due for your colonoscopy at [facility]. You are due for a mammogram in March of this year at the [facility].  We will see you in 6 months with labs.

## 2012-02-21 NOTE — Telephone Encounter (Signed)
Gave patient appointment for 08-2012 

## 2012-02-27 ENCOUNTER — Telehealth: Payer: Self-pay | Admitting: Family Medicine

## 2012-02-27 NOTE — Telephone Encounter (Signed)
Called pt.  Although she has BCBS, she has medicaid as secondary.  She will need a referral.  Will forward to MD to place [name], [name]

## 2012-02-27 NOTE — Telephone Encounter (Signed)
Due to the insurance that [name] have, the GI office need provider to call to schedule her appt for the colonoscopy.  If you have any questions about this, please call [name] back.

## 2012-02-28 ENCOUNTER — Other Ambulatory Visit: Payer: Self-pay | Admitting: Family Medicine

## 2012-02-28 DIAGNOSIS — Z0000 Encounter for general adult medical examination without abnormal findings: Secondary | ICD-10-CM

## 2012-02-28 NOTE — Telephone Encounter (Signed)
Placed order for Referral to GI for screening colonoscopy.

## 2012-02-28 NOTE — Progress Notes (Signed)
Gave patient information on colonoscopy and was told needed to put in referral because pt has secondary [employer].  Referral placed to GI for screening colonoscopy.

## 2012-03-02 ENCOUNTER — Encounter: Payer: Self-pay | Admitting: Internal Medicine

## 2012-03-10 ENCOUNTER — Telehealth (INDEPENDENT_AMBULATORY_CARE_PROVIDER_SITE_OTHER): Payer: Self-pay | Admitting: General Surgery

## 2012-03-10 ENCOUNTER — Other Ambulatory Visit: Payer: Self-pay | Admitting: Oncology

## 2012-03-10 ENCOUNTER — Other Ambulatory Visit: Payer: Self-pay | Admitting: Family Medicine

## 2012-03-10 DIAGNOSIS — Z853 Personal history of malignant neoplasm of breast: Secondary | ICD-10-CM

## 2012-03-10 DIAGNOSIS — Z9882 Breast implant status: Secondary | ICD-10-CM

## 2012-03-10 DIAGNOSIS — Z9011 Acquired absence of right breast and nipple: Secondary | ICD-10-CM

## 2012-03-10 DIAGNOSIS — Z1231 Encounter for screening mammogram for malignant neoplasm of breast: Secondary | ICD-10-CM

## 2012-03-10 NOTE — Telephone Encounter (Signed)
Called patient and advised of appointment on 03/19/12 being moved to 04/23/12 with the same time of 9:30. Appointment moved due to patient scheduled by Dr. Welton Flakes to have mammogram on 04/13/12.

## 2012-03-19 ENCOUNTER — Ambulatory Visit (INDEPENDENT_AMBULATORY_CARE_PROVIDER_SITE_OTHER): Payer: BC Managed Care – PPO | Admitting: General Surgery

## 2012-03-26 ENCOUNTER — Ambulatory Visit (AMBULATORY_SURGERY_CENTER): Payer: BC Managed Care – PPO | Admitting: *Deleted

## 2012-03-26 VITALS — Ht 64.5 in | Wt 214.0 lb

## 2012-03-26 DIAGNOSIS — Z1211 Encounter for screening for malignant neoplasm of colon: Secondary | ICD-10-CM

## 2012-03-26 MED ORDER — PEG-KCL-NACL-NASULF-NA ASC-C 100 G PO SOLR: ORAL | Status: DC

## 2012-03-26 NOTE — Progress Notes (Signed)
No egg or soy allergy 

## 2012-04-09 ENCOUNTER — Other Ambulatory Visit: Payer: Self-pay | Admitting: Internal Medicine

## 2012-04-09 ENCOUNTER — Ambulatory Visit (AMBULATORY_SURGERY_CENTER): Payer: BC Managed Care – PPO | Admitting: Internal Medicine

## 2012-04-09 ENCOUNTER — Encounter: Payer: Self-pay | Admitting: Internal Medicine

## 2012-04-09 VITALS — BP 185/97 | HR 65 | Temp 97.4°F | Resp 26 | Ht 64.5 in | Wt 214.0 lb

## 2012-04-09 DIAGNOSIS — Z1211 Encounter for screening for malignant neoplasm of colon: Secondary | ICD-10-CM

## 2012-04-09 DIAGNOSIS — D126 Benign neoplasm of colon, unspecified: Secondary | ICD-10-CM

## 2012-04-09 MED ORDER — SODIUM CHLORIDE 0.9 % IV SOLN: 500.0000 mL | INTRAVENOUS | Status: DC

## 2012-04-09 NOTE — Op Note (Signed)
[facility] [address] [place], [address]   COLONOSCOPY PROCEDURE REPORT  PATIENT: [name], [name]  MR#: [id number] BIRTHDATE: September 19, 1960 , 52  yrs. old GENDER: Female ENDOSCOPIST: Roxy Cedar, MD REFERRED BY: Simone Curia, MD PROCEDURE DATE:  04/09/2012 PROCEDURE:   Colonoscopy with snare polypectomy ASA CLASS:   Class II INDICATIONS:average risk screening. MEDICATIONS: MAC sedation, administered by CRNA and propofol (Diprivan) 300mg  IV  DESCRIPTION OF PROCEDURE:   After the risks benefits and alternatives of the procedure were thoroughly explained, informed consent was obtained.  A digital rectal exam revealed no abnormalities of the rectum.   The LB [id number]  endoscope was introduced through the anus and advanced to the cecum, which was identified by both the appendix and ileocecal valve. No adverse events experienced.   The quality of the prep was excellent, using MoviPrep  The instrument was then slowly withdrawn as the colon was fully examined.      COLON FINDINGS: A diminutive polyp was found.  A polypectomy was performed with a cold snare.  The resection was complete and the polyp tissue was completely retrieved.   Moderate diverticulosis was noted in the ascending colon.   The colon mucosa was otherwise normal.  Retroflexed views revealed no abnormalities. The time to cecum=3 minutes 0 seconds.  Withdrawal time=9 minutes 39 seconds. The scope was withdrawn and the procedure completed. COMPLICATIONS: There were no complications.  ENDOSCOPIC IMPRESSION: 1.   Diminutive polyp was found; polypectomy was performed with a cold snare 2.   Moderate diverticulosis was noted in the ascending colon 3.   The colon mucosa was otherwise normal  RECOMMENDATIONS: Repeat colonoscopy in 5 years if polyp adenomatous; otherwise 10 years   eSigned:  Roxy Cedar, MD 04/09/2012 11:41 AM   cc: The Patient  ; Reyes Ivan  [facility])   PATIENT NAME:  [name], [name] MR#: [id number]

## 2012-04-09 NOTE — Progress Notes (Signed)
Lidocaine-40mg IV prior to Propofol InductionPropofol given over incremental dosages 

## 2012-04-09 NOTE — Patient Instructions (Addendum)

## 2012-04-09 NOTE — Progress Notes (Signed)
Patient did not experience any of the following events: a burn prior to discharge; a fall within the facility; wrong site/side/patient/procedure/implant event; or a hospital transfer or hospital admission upon discharge from the facility. (G8907) Patient did not have preoperative order for IV antibiotic SSI prophylaxis. (G8918)  

## 2012-04-10 ENCOUNTER — Telehealth: Payer: Self-pay | Admitting: *Deleted

## 2012-04-10 NOTE — Telephone Encounter (Signed)
No answer left message to call office if questions or concerns. 

## 2012-04-13 ENCOUNTER — Ambulatory Visit: Payer: BC Managed Care – PPO

## 2012-04-14 ENCOUNTER — Encounter: Payer: Self-pay | Admitting: Internal Medicine

## 2012-04-17 ENCOUNTER — Encounter: Payer: Self-pay | Admitting: Family Medicine

## 2012-04-17 ENCOUNTER — Other Ambulatory Visit: Payer: Self-pay | Admitting: Oncology

## 2012-04-17 DIAGNOSIS — R1032 Left lower quadrant pain: Secondary | ICD-10-CM | POA: Insufficient documentation

## 2012-04-21 ENCOUNTER — Encounter: Payer: Self-pay | Admitting: Family Medicine

## 2012-04-21 ENCOUNTER — Telehealth (INDEPENDENT_AMBULATORY_CARE_PROVIDER_SITE_OTHER): Payer: Self-pay

## 2012-04-21 NOTE — Telephone Encounter (Signed)
Message copied by Ivory Broad on Tue Apr 21, 2012 11:32 AM ------      Message from: Zacarias Pontes      Created: Tue Apr 21, 2012 11:07 AM       Pt has apt on 4/28 with HI/ she feels like this is too long to wait after her mgm is done on 3/19?Marland Kitchen..she wants you to call her ------

## 2012-04-21 NOTE — Telephone Encounter (Signed)
I called the pt and moved her appointment up to 4/1 at 3:45.

## 2012-04-22 ENCOUNTER — Ambulatory Visit
Admission: RE | Admit: 2012-04-22 | Discharge: 2012-04-22 | Disposition: A | Discharge status: Home / Self Care | Payer: BC Managed Care – PPO | Source: Ambulatory Visit | Attending: Oncology | Admitting: Oncology

## 2012-04-22 DIAGNOSIS — Z9011 Acquired absence of right breast and nipple: Secondary | ICD-10-CM

## 2012-04-22 DIAGNOSIS — Z9882 Breast implant status: Secondary | ICD-10-CM

## 2012-04-22 DIAGNOSIS — Z1231 Encounter for screening mammogram for malignant neoplasm of breast: Secondary | ICD-10-CM

## 2012-04-22 DIAGNOSIS — Z853 Personal history of malignant neoplasm of breast: Secondary | ICD-10-CM

## 2012-04-23 ENCOUNTER — Ambulatory Visit (INDEPENDENT_AMBULATORY_CARE_PROVIDER_SITE_OTHER): Payer: BC Managed Care – PPO | Admitting: General Surgery

## 2012-04-23 ENCOUNTER — Encounter: Payer: Self-pay | Admitting: Family Medicine

## 2012-04-26 ENCOUNTER — Emergency Department (INDEPENDENT_AMBULATORY_CARE_PROVIDER_SITE_OTHER)
Admission: EM | Admit: 2012-04-26 | Discharge: 2012-04-26 | Disposition: A | Discharge status: Home / Self Care | Payer: BC Managed Care – PPO | Source: Home / Self Care | Attending: Family Medicine | Admitting: Family Medicine

## 2012-04-26 ENCOUNTER — Emergency Department (INDEPENDENT_AMBULATORY_CARE_PROVIDER_SITE_OTHER): Payer: BC Managed Care – PPO

## 2012-04-26 ENCOUNTER — Encounter (HOSPITAL_COMMUNITY): Payer: Self-pay | Admitting: *Deleted

## 2012-04-26 DIAGNOSIS — M5380 Other specified dorsopathies, site unspecified: Secondary | ICD-10-CM | POA: Diagnosis not present

## 2012-04-26 DIAGNOSIS — R079 Chest pain, unspecified: Secondary | ICD-10-CM | POA: Diagnosis not present

## 2012-04-26 DIAGNOSIS — M62830 Muscle spasm of back: Secondary | ICD-10-CM

## 2012-04-26 MED ORDER — HYDROCODONE-ACETAMINOPHEN 5-325 MG PO TABS
1.0000 | ORAL_TABLET | Freq: Once | ORAL | Status: AC
Start: 2012-04-26 — End: 2012-04-26
  Administered 2012-04-26: 1 via ORAL

## 2012-04-26 MED ORDER — HYDROCODONE-ACETAMINOPHEN 5-325 MG PO TABS
ORAL_TABLET | ORAL | Status: AC
  Filled 2012-04-26: qty 1

## 2012-04-26 MED ORDER — TRAMADOL HCL 50 MG PO TABS: 50.0000 mg | ORAL_TABLET | Freq: Four times a day (QID) | ORAL | Status: DC | PRN

## 2012-04-26 MED ORDER — CYCLOBENZAPRINE HCL 10 MG PO TABS: 10.0000 mg | ORAL_TABLET | Freq: Two times a day (BID) | ORAL | Status: DC | PRN

## 2012-04-26 MED ORDER — ACETAMINOPHEN 650 MG PO TABS: 1.0000 | ORAL_TABLET | Freq: Three times a day (TID) | ORAL | Status: DC | PRN

## 2012-04-26 NOTE — ED Notes (Signed)
Pt  Reports  Pain  r  Flank   And  r  Side         X  3  Days   Pain is  worse  When  She  Takes  A  Deep  Breath  And  Or  On   Certain  Movements   She  Ambulated  To  Exam  Room         She  denys  Any  Nausea   /  Vomiting   Or loose  Stool

## 2012-04-26 NOTE — ED Provider Notes (Signed)
History     CSN: [id number]  Arrival date & time 04/26/12  1133   First MD Initiated Contact with Patient 04/26/12 1227      Chief Complaint  Patient presents with  . Flank Pain    (Consider location/radiation/quality/duration/timing/severity/associated sxs/prior treatment) HPI Comments:  52 year-old female with history of breast cancer status post right mastectomy in 2011 with subcutaneous recurrence s/p resection with radiation being followed by oncology. Here complaining of right mid back and lateral chest discomfort with deep breathing and with touching the area. Symptoms present for 3 days. And worse with inspiration and movement. She denies cough or congestion. No fever or chills. No general malaise. Patient reports she last was seen at her oncologist office in [place] and had a screening colonoscopy on March 6. Denies abdominal pain, nausea vomiting or difficulty with bowel movements.     Past Medical History  Diagnosis Date  . Hypertension   . Hyperlipidemia   . Thrombocytopenia   . Diabetes mellitus   . Arthritis   . Breast CA     breast CA- right breast  . Thyroid disease     parathyroid removed    Past Surgical History  Procedure Laterality Date  . Breast surgery  09/12/10    excision of skin mass - right breast, had another surgery to remove more Cancer cells  . Parathyroidectomy      partial?  . Abdominal hysterectomy      1996 per pt    Family History  Problem Relation Age of Onset  . Heart disease Mother   . Stroke Father   . Heart disease Brother   . Colon cancer Neg Hx   . Esophageal cancer Neg Hx   . Rectal cancer Neg Hx   . Stomach cancer Neg Hx     History  Substance Use Topics  . Smoking status: Never Smoker   . Smokeless tobacco: Never Used  . Alcohol Use: No    OB History   Grav Para Term Preterm Abortions TAB SAB Ect Mult Living                  Review of Systems  Constitutional: Negative for fever, chills, diaphoresis,  appetite change, fatigue and unexpected weight change.  Respiratory: Negative for chest tightness and shortness of breath.   Cardiovascular: Positive for chest pain. Negative for palpitations and leg swelling.  Gastrointestinal: Negative for nausea, vomiting, abdominal pain and diarrhea.  Genitourinary: Positive for flank pain. Negative for dysuria.  Musculoskeletal: Positive for back pain.  Skin: Negative for rash.  Neurological: Negative for dizziness and headaches.  All other systems reviewed and are negative.    Allergies  Review of patient's allergies indicates no known allergies.  Home Medications   Current Outpatient Rx  Name  Route  Sig  Dispense  Refill  . Acetaminophen 650 MG TABS   Oral   Take 1 tablet (650 mg total) by mouth 3 (three) times daily as needed.   30 tablet   0   . amLODipine (NORVASC) 5 MG tablet   Oral   Take 1 tablet (5 mg total) by mouth daily.   30 tablet   11   . carvedilol (COREG) 25 MG tablet   Oral   Take 1 tablet (25 mg total) by mouth 2 (two) times daily with a meal.   60 tablet   11   . cyclobenzaprine (FLEXERIL) 10 MG tablet   Oral   Take 1 tablet (10  mg total) by mouth 2 (two) times daily as needed for muscle spasms.   20 tablet   0   . IRON PO   Oral   Take by mouth daily.         . metFORMIN (GLUCOPHAGE) 1000 MG tablet   Oral   Take 0.5 tablets (500 mg total) by mouth daily with breakfast.   30 tablet   2   . pravastatin (PRAVACHOL) 40 MG tablet   Oral   Take 1 tablet (40 mg total) by mouth every evening.   30 tablet   2   . tamoxifen (NOLVADEX) 20 MG tablet      TAKE ONE TABLET BY MOUTH EVERY DAY   30 tablet   0   . traMADol (ULTRAM) 50 MG tablet   Oral   Take 1 tablet (50 mg total) by mouth every 6 (six) hours as needed for pain.   15 tablet   0   . venlafaxine XR (EFFEXOR-XR) 37.5 MG 24 hr capsule   Oral   Take 1 capsule (37.5 mg total) by mouth daily.   30 capsule   3     BP 151/76  Pulse 71   Temp(Src) 98.3 F (36.8 C) (Oral)  Resp 18  SpO2 100%  Physical Exam  Nursing note and vitals reviewed. Constitutional: She is oriented to person, place, and time. She appears well-developed and well-nourished. No distress.  Eyes: Conjunctivae are normal.  Cardiovascular: Normal rate, regular rhythm and normal heart sounds.   Pulmonary/Chest: Effort normal and breath sounds normal. No respiratory distress. She has no wheezes. She has no rales. She exhibits no tenderness.  Status post right side mastectomy. Well-healed scar. No other obvious skin changes.  Musculoskeletal:  Tenderness to palpation over mid back right parasternal muscles. Also tenderness to palpation over right lower costal margin.  Lymphadenopathy:    She has no cervical adenopathy.  Neurological: She is alert and oriented to person, place, and time.  Skin: No rash noted. She is not diaphoretic.    ED Course  Procedures (including critical care time)  Labs Reviewed - No data to display Dg Chest 2 View  04/26/2012  *RADIOLOGY REPORT*  Clinical Data: Right-sided chest pain  CHEST - 2 VIEW  Comparison: Chest CT 09/21/2010  Findings: Normal mediastinum and cardiac silhouette.  Normal pulmonary  vasculature.  No evidence of effusion, infiltrate, or pneumothorax.  No acute bony abnormality.  Bulky osteophytes of the spine.  Right axillary clips noted.  IMPRESSION: No acute cardiopulmonary process.   Original Report Authenticated By: Genevive Bi, M.D.      1. Muscle spasm of back       MDM  Impress musculoskeletal. Discussed with patient possibility of peripheral neuropathy. Also recommended followup with oncology as she's had subcutaneous recurrence in the past. Prescribed Flexeril, acetaminophen and tramadol.  Asked to followup with her oncologist or primary care provider if persistent or not improving symptoms.        Sharin Grave, MD 04/27/12 1049

## 2012-05-01 ENCOUNTER — Ambulatory Visit (INDEPENDENT_AMBULATORY_CARE_PROVIDER_SITE_OTHER): Payer: BC Managed Care – PPO | Admitting: Family Medicine

## 2012-05-01 ENCOUNTER — Encounter: Payer: Self-pay | Admitting: Family Medicine

## 2012-05-01 VITALS — BP 159/82 | HR 108 | Temp 98.4°F | Ht 64.5 in | Wt 215.0 lb

## 2012-05-01 DIAGNOSIS — R109 Unspecified abdominal pain: Secondary | ICD-10-CM | POA: Insufficient documentation

## 2012-05-01 MED ORDER — TRAMADOL HCL 50 MG PO TABS: 50.0000 mg | ORAL_TABLET | Freq: Four times a day (QID) | ORAL | Status: DC | PRN

## 2012-05-01 MED ORDER — CYCLOBENZAPRINE HCL 5 MG PO TABS
5.0000 mg | ORAL_TABLET | Freq: Three times a day (TID) | ORAL | Status: DC | PRN
Start: 2012-05-01 — End: 2013-02-26

## 2012-05-01 MED ORDER — OMEPRAZOLE MAGNESIUM 20 MG PO TBEC: 20.0000 mg | DELAYED_RELEASE_TABLET | Freq: Every day | ORAL | Status: DC

## 2012-05-01 NOTE — Patient Instructions (Signed)
I am sorry your stomach is having spasms.  I think this is either acid reflux of a pulled muscle in your abdomen.  Please try heat or ice on your abdomen where it hurts, and also the flexeril for muscle spasm.  I also want you to try taking omeprazole for acid reflux.

## 2012-05-01 NOTE — Progress Notes (Signed)
  Subjective:    Patient ID: [name], female    DOB: 07/27/1960, 52 y.o.   MRN: [id number]  HPI  [name] comes in for a pain and spasm in her stomach.  She says it has been going on about two weeks.  She says she is worried about it being from her gallbladder.  She says that the pain happens when she is lying down and sits up.  It is epigastric and it is a crampy and sharp pain.  Does not radiate anywhere.  She also describes a bad taste in her mouth.  She denies any association with food or laying down at night time, just pain when she sits up.    Review of Systems See HPI    Objective:   Physical Exam BP 159/82  Pulse 108  Temp(Src) 98.4 F (36.9 C) (Oral)  Ht 5' 4.5" (1.638 m)  Wt 215 lb (97.523 kg)  BMI 36.35 kg/m2 General appearance: alert, cooperative and no distress Abdomen: soft, non-tender; bowel sounds normal; no masses,  no organomegaly, mild epigastric discomfort, no hernias.       Assessment & Plan:

## 2012-05-01 NOTE — Assessment & Plan Note (Signed)
Suspect this is a pulled abdominal muscle with spasm, vs. GERD.  Will treat for both- Rx for flexeril, advised ice or heat therapy, and will start on omeprazole for reflux.  Also Rx tramadol PRN for pain.

## 2012-05-04 ENCOUNTER — Encounter: Payer: Self-pay | Admitting: Family Medicine

## 2012-05-04 ENCOUNTER — Ambulatory Visit (INDEPENDENT_AMBULATORY_CARE_PROVIDER_SITE_OTHER): Payer: BC Managed Care – PPO | Admitting: Family Medicine

## 2012-05-04 VITALS — BP 150/78 | HR 101 | Temp 98.7°F | Ht 64.5 in | Wt 216.0 lb

## 2012-05-04 DIAGNOSIS — R600 Localized edema: Secondary | ICD-10-CM

## 2012-05-04 DIAGNOSIS — R739 Hyperglycemia, unspecified: Secondary | ICD-10-CM

## 2012-05-04 DIAGNOSIS — R609 Edema, unspecified: Secondary | ICD-10-CM

## 2012-05-04 DIAGNOSIS — D693 Immune thrombocytopenic purpura: Secondary | ICD-10-CM

## 2012-05-04 DIAGNOSIS — R7309 Other abnormal glucose: Secondary | ICD-10-CM

## 2012-05-04 DIAGNOSIS — R109 Unspecified abdominal pain: Secondary | ICD-10-CM | POA: Diagnosis not present

## 2012-05-04 DIAGNOSIS — I10 Essential (primary) hypertension: Secondary | ICD-10-CM | POA: Diagnosis not present

## 2012-05-04 DIAGNOSIS — R7303 Prediabetes: Secondary | ICD-10-CM

## 2012-05-04 DIAGNOSIS — E785 Hyperlipidemia, unspecified: Secondary | ICD-10-CM

## 2012-05-04 LAB — POCT GLYCOSYLATED HEMOGLOBIN (HGB A1C): Hemoglobin A1C: 6

## 2012-05-04 LAB — CBC
HCT: 40.9 % (ref 36.0–46.0)
Hemoglobin: 13.3 g/dL (ref 12.0–15.0)
RBC: 5.29 MIL/uL — ABNORMAL HIGH (ref 3.87–5.11)

## 2012-05-04 LAB — COMPREHENSIVE METABOLIC PANEL
Alkaline Phosphatase: 75 U/L (ref 39–117)
CO2: 29 mEq/L (ref 19–32)
Creat: 0.85 mg/dL (ref 0.50–1.10)
Glucose, Bld: 96 mg/dL (ref 70–99)
Total Bilirubin: 0.3 mg/dL (ref 0.3–1.2)

## 2012-05-04 LAB — LIPID PANEL
Cholesterol: 225 mg/dL — ABNORMAL HIGH (ref 0–200)
HDL: 60 mg/dL (ref >39)
Total CHOL/HDL Ratio: 3.8 Ratio
Triglycerides: 271 mg/dL — ABNORMAL HIGH (ref <150)
VLDL: 54 mg/dL — ABNORMAL HIGH (ref 0–40)

## 2012-05-04 LAB — LIPASE: Lipase: <10 U/L (ref 0–75)

## 2012-05-04 MED ORDER — OMEPRAZOLE MAGNESIUM 20 MG PO TBEC: 40.0000 mg | DELAYED_RELEASE_TABLET | Freq: Two times a day (BID) | ORAL | Status: DC

## 2012-05-04 MED ORDER — OMEPRAZOLE 40 MG PO CPDR: 40.0000 mg | DELAYED_RELEASE_CAPSULE | Freq: Two times a day (BID) | ORAL | Status: DC

## 2012-05-04 NOTE — Assessment & Plan Note (Signed)
-   Lipid panel today (fasting, though pt stated had coffee this morning and initially stated with cream and sugar, then stated black)

## 2012-05-04 NOTE — Assessment & Plan Note (Signed)
>>  ASSESSMENT AND PLAN FOR DM (DIABETES MELLITUS) WITH COMPLICATIONS (HCC) WRITTEN ON 05/04/2012  4:16 PM BY THEKKEKANDAM, MARIA T, MD  - Hemoglobin A1c today

## 2012-05-04 NOTE — Assessment & Plan Note (Signed)
-   Encouraged compression stockings, keeping legs elevated - Start HCTZ for BP and edema if cr WNL

## 2012-05-04 NOTE — Assessment & Plan Note (Signed)
BP not well-controlled today at 150s/70s, with reported similar values at home. Subjective lower extremity edema.  - Continue norvasc 5mg  daily, carvedilol 25mg  BID.  - CMET today to evaluate Cr; if WNL, add HCTZ (may also help with subjective feeling of edema) but will warn pt to let me know if she re-develops leg cramping - Encouraged bringing all medications and log of blood pressures. - Encouraged exercise and DASH diet - Discuss referring to Dr Gerilyn Pilgrim at f/u

## 2012-05-04 NOTE — Assessment & Plan Note (Signed)
No symptoms currently of bruising or bleeding - CBC today to f/u PLT level

## 2012-05-04 NOTE — Assessment & Plan Note (Signed)
Hemoglobin A1c today.

## 2012-05-04 NOTE — Progress Notes (Signed)
Subjective:     Patient ID: [name], female   DOB: [birth year], 52 y.o.   MRN: [id number]  CC - Abdominal pain and f/u HTN  HPI - [name] is a 52 y.o. female with h/o ITP, HTN, HLD, preDM, and right breast cancer here to f/u abdominal pain and HTN.  1. Abdominal pain - [name] states she went to Urgent Care last week (3/23) due to pain from under her breast bone to her right upper quadrant. She was diagnosed with spasms and given flexeril and tramadol, which helped symptoms. Then 3/24 she started having sour taste in her mouth, though abdominal pain had almost gone by then. She has never had these symptoms before, and feel they are related to food intake, especially anything acidic/sodas. Cannot think of any other inciting factor. Laying down and flexeril help (was taking TID, now once daily). Denies nausea, vomiting, chest pain, dyspnea, fever, chills, hoarseness, sore throat, sensation of food/pills/liquid getting stuck in throat.  2. Hypertension - Pt reports taking norvasc 5 mg daily, carvedilol 25mg  BID, and took medications this morning. Was previously on HCTZ which she thinks helped with BP and leg swelling, although it had been stopped 07/2011 per records due to leg cramping. BPs have been in 150s/70s at home though checks infrequently. Reports feeling of lower extremity edema and clothes fitting tighter.  Review of Systems - Per HPI  Reviewed PMH, SH, and FH. Medications pt is taking: - Tylenol 650mg  2 tablets ~2x/week - Amlodipine 5mg  daily - Carvedilol 25mg  BID - Flexeril 5mg  daily - Iron daily - Metformin 0.5 tab (500mg ) daily - Omeprazole 20mg  daily - Pravastatin 40mg  daily (forgot so has not been taking ~2 months) - Tamoxifen 20mg  daily - Venlafaxine xr 37.5mg  daily - *NOT TAKING ultram 50mg     Objective:   Physical Exam BP 150/78  Pulse 101  Temp(Src) 98.7 F (37.1 C) (Oral)  Ht 5' 4.5" (1.638 m)  Wt 216 lb (97.977 kg)  BMI 36.52 kg/m2 GEN: NAD, seated  in exam room, pleasant CV: RRR, distant heart sounds, no murmurs, rubs, or gallops, 2+ bilateral radial pulses EXTR: no lower extremity edema; normal tone throughout ABD: Soft, nontender, nondistended, NABS, obese NEURO: Alert, oriented, no focal deficits    Assessment:      [name] is a 52 y.o. female with h/o ITP, HTN, HLD, preDM, and right breast cancer here to f/u abdominal pain and HTN.    Plan:

## 2012-05-04 NOTE — Patient Instructions (Addendum)
Good to see you today [name]!  For your abdominal pain and sour taste, I think this is most likely related to acid reflux though it could also be from your gallbladder.  - Increase omeprazole to 40mg  twice daily for 2 weeks. - Avoid foods that make it worse (acidic, sodas, coffee, alcohol). - Follow up in 2 weeks with me to see how this is doing. If not better, we may get an abdominal ultrasound. Bring all medications and log of blood pressures.  For your blood pressure, we are checking some labs today. I will call you if these are abnormal. If normal, we may start another blood pressure medication.  For your legs, try compression stockings and keeping your legs elevated.  Continue exercising and try the DASH diet (low salt, high in fruit and vegetables).  Come back fasting for a lipid panel (make a lab-only appointment).

## 2012-05-04 NOTE — Assessment & Plan Note (Addendum)
Resolving, with continued sour taste. Clinical hx suggests likely worsened reflux from muscle spasms v esophageal spasm v GERD; also consider cholecystitis or pancreatitis, though less likely with resolving pain. - Increase omeprazole to 40mg  twice daily for 2 weeks. - Avoid foods that make it worse (acidic, sodas, coffee, alcohol). - CMET (LFTs) and lipase today  - Follow up in 2 weeks with me to see how this is doing. If not better, we may get an abdominal ultrasound or EGD. If better, d/c tramadol and flexeril

## 2012-05-05 ENCOUNTER — Encounter (INDEPENDENT_AMBULATORY_CARE_PROVIDER_SITE_OTHER): Payer: Self-pay | Admitting: General Surgery

## 2012-05-05 ENCOUNTER — Ambulatory Visit (INDEPENDENT_AMBULATORY_CARE_PROVIDER_SITE_OTHER): Payer: BC Managed Care – PPO | Admitting: General Surgery

## 2012-05-05 VITALS — BP 146/94 | HR 76 | Temp 97.8°F | Resp 16 | Ht 63.0 in | Wt 216.8 lb

## 2012-05-05 DIAGNOSIS — C50919 Malignant neoplasm of unspecified site of unspecified female breast: Secondary | ICD-10-CM

## 2012-05-05 NOTE — Patient Instructions (Signed)
Your physical exam today is normal. There is no evidence of cancer in your breast or in your lymph nodes. Your recent mammogram of the left breast is also normal. I think that you are cancer free.  Keep your regular scheduled appointment with Dr. Drue Second.  Return to see Dr. Derrell Lolling in one year, after you get your annual left breast mammogram.

## 2012-05-05 NOTE — Progress Notes (Signed)
Patient ID: [name], female   DOB: 1961-01-26, 52 y.o.   MRN: [id number] History: This patient returns for long-term followup regarding her right breast cancer. She initially underwent insertion of Port-A-Cath in right total mastectomy and sentinel node biopsy and placement of tissue expander on 07/21/2009. 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 She had adjuvant chemotherapy was then placed on tamoxifen. She developed a skin recurrence on the right and on 09/26/2010 she underwent a right partial mastectomy and wide local excision by me.  She was able to retain her implant on the right.This showed recurrent invasive cancer in the skin. She had adjuvant radiation therapy. This has not recurred. She continues on tamoxifen and sees Dr. Drue Second every 6 months. She keeps in touch with Dr. Etter Sjogren and is considering a reduction mammoplasty on the left.  ROS: 10 system review of systems is negative except as described above.  Exam: Patient looks well. No distress. BMI 37.9. Neck no adenopathy or mass Lungs clear to auscultation bilaterally Heart regular rate and rhythm. No murmur. No ectopy Breast right mastectomy incision well-healed. Implant in place. Skin and soft tissues feel normal. No nodules. No ulcerations. No axillary adenopathy. Good range of motion right shoulder. Left breast large. Skin healthy. Nipple and areola healthy. No palpable mass. No axillary adenopathy  Assessment: Multifocal invasive carcinoma right breast, status post right total mastectomy sentinel node biopsy and insertion of Port-A-Cath on 07/21/2009, followed by adjuvant chemotherapy. This was invasive carcinoma, HER-2 negative, receptor positive, pathologic stage T2, N0.                        History skin recurrence right mastectomy wound, treated  with right partial mastectomy, salvage of implant, postop radiation therapy. No evidence of  recurrence to date  Plan: Keep regular appointments with Dr.  Welton Flakes. Continue tamoxifen. Return to see me in one year after she gets her annual left breast mammogram.    Angelia Mould. Derrell Lolling, M.D., [facility] General and Minimally invasive Surgery Breast and Colorectal Surgery Office:   [phone] Pager:   [phone]

## 2012-05-06 ENCOUNTER — Telehealth: Payer: Self-pay | Admitting: Family Medicine

## 2012-05-06 NOTE — Telephone Encounter (Signed)
Is asking for results of her labs - pls advise

## 2012-05-06 NOTE — Telephone Encounter (Signed)
Called and spoke with Ms Pineau to tell her her most recent lab values.   Creatinine is WNL but will not start HCTZ at this time as pt's BP at home has been 130s/70s.  She is still having sour taste in her mouth and it is bothering her, though denies any abdominal pain. Recommended continuing omeprazole at higher dose, avoiding caffeine, and trying to identify triggers. States she does not drink alcohol or smoke. F/u in 2-3 weeks and discuss EGD at that time if still bothersome, vs h pylori testing.  Discussed elevated lipid panel. Pt will try eating fewer sugar-high or carb-rich foods and wants to exercise more. Will f/u at next visit.  Pt plans to come back in 2-3 weeks.

## 2012-05-09 ENCOUNTER — Encounter: Payer: Self-pay | Admitting: Family Medicine

## 2012-05-14 ENCOUNTER — Other Ambulatory Visit: Payer: Self-pay | Admitting: Oncology

## 2012-06-01 ENCOUNTER — Other Ambulatory Visit: Payer: Self-pay | Admitting: *Deleted

## 2012-06-01 ENCOUNTER — Ambulatory Visit (INDEPENDENT_AMBULATORY_CARE_PROVIDER_SITE_OTHER): Payer: BC Managed Care – PPO | Admitting: General Surgery

## 2012-06-01 DIAGNOSIS — D6949 Other primary thrombocytopenia: Secondary | ICD-10-CM

## 2012-06-01 DIAGNOSIS — C50919 Malignant neoplasm of unspecified site of unspecified female breast: Secondary | ICD-10-CM

## 2012-06-01 DIAGNOSIS — D693 Immune thrombocytopenic purpura: Secondary | ICD-10-CM

## 2012-06-01 MED ORDER — VENLAFAXINE HCL ER 37.5 MG PO CP24: 37.5000 mg | ORAL_CAPSULE | Freq: Every day | ORAL | Status: DC

## 2012-06-05 ENCOUNTER — Ambulatory Visit (INDEPENDENT_AMBULATORY_CARE_PROVIDER_SITE_OTHER): Payer: BC Managed Care – PPO | Admitting: Family Medicine

## 2012-06-05 ENCOUNTER — Encounter: Payer: Self-pay | Admitting: Family Medicine

## 2012-06-05 VITALS — BP 160/90 | HR 83 | Ht 64.0 in | Wt 213.0 lb

## 2012-06-05 DIAGNOSIS — I10 Essential (primary) hypertension: Secondary | ICD-10-CM

## 2012-06-05 DIAGNOSIS — D693 Immune thrombocytopenic purpura: Secondary | ICD-10-CM

## 2012-06-05 DIAGNOSIS — M1990 Unspecified osteoarthritis, unspecified site: Secondary | ICD-10-CM

## 2012-06-05 DIAGNOSIS — K143 Hypertrophy of tongue papillae: Secondary | ICD-10-CM

## 2012-06-05 MED ORDER — HYDROCHLOROTHIAZIDE 25 MG PO TABS: 25.0000 mg | ORAL_TABLET | Freq: Every day | ORAL | Status: DC

## 2012-06-05 MED ORDER — TRAMADOL HCL 50 MG PO TABS: 50.0000 mg | ORAL_TABLET | Freq: Every day | ORAL | Status: DC | PRN

## 2012-06-05 NOTE — Patient Instructions (Addendum)
It was great to see you today.  For your blood pressure, start taking Hydrochlorothiazide 12.5 mg daily. Continue trying to lose weight and follow up with Dr. Gerilyn Pilgrim or come up with 1 goal you think you can reach and write this down (and try to do it!). I believe you can be successful.  For your hand pain, I think you have osteoarthritis. Take tylenol for pain, and if you need a rescue medicine, take tramadol. I will prescribe 3 months worth.  For your tongue, continue omeprazole and we can follow up on this.  Continue taking the higher dose of omeprazole.  Follow up with me in 2 weeks to discuss your blood pressure and weight loss goals.

## 2012-06-05 NOTE — Progress Notes (Signed)
Subjective:     Patient ID: [name], female   DOB: 1960/09/29, 52 y.o.   MRN: [id number]  CC - F/u of BP and hand pain  HPI  [name] is a 52 y.o. female with HTN, HLD, ITP, h/o right breast infiltrating ductal carcinoma, DJD, and GERD here for follow-up.  1. BP - Reports home BP has been 156/80s with no lows. Reports blurred vision and headaches when pressure is high in the mornings and dizziness without syncope, with symptom improvement with taking medication.   2. Coating on tongue. - Reports that increasing omeprazole helepd with sour taste but still has a whitish coating on tongue that she is unable to brush off. Denies gassiness or abdominal pain, fevers, diarrhea, vomiting, or tongue pain. Drinks plenty of water.   3. Hand pain - Reports 2 weeks hand pain with some "bumps" that are hard on her hands. Bumps are occasionally tender and have become inflamed in the past. Reports difficulty gripping with both hands due to pain. Thinks it could be from washing dishes lots. Fingers cramp and hands are occasionally swollen. Denies extension beyond hands. Reports chronic thumb pain.  Review of Systems - Per HPI  Past Medical History  Diagnosis Date  . Hypertension   . Hyperlipidemia   . Thrombocytopenia   . Diabetes mellitus   . Arthritis   . Breast CA     breast CA- right breast  . Thyroid disease     parathyroid removed  . Heel spur     bilateral   Denies medication changes.     Objective:   Physical Exam BP 164/89  Pulse 83  Ht 5\' 4"  (1.626 m)  Wt 213 lb (96.616 kg)  BMI 36.54 kg/m2 GEN: NAD, pleasant CV: RRR, no murmurs, rubs, gallops, 2+ bilateral radial pulse PULM: CTAB, no wheezes or crackles HEENT: O/p clear, tongue with whitish appearance on anterior surface throughout, unable to wipe away the whiteness/no exudate, no erythema or erosion, nontender, no bleeding; sclera clear, EOMI EXTR: No LE edema, no cyanosis; Bilateral hands with osteophytes (left  lateral wrist > right lateral wrist, right pointer DIP on right side), bilateral joint tenderness (right pinky PIP joint, left middle PIP joint), no erythema or inflammation noticed, full ROM, 4/5 grip strength bilaterally     Assessment:     [name] is a 52 y.o. female with HTN, HLD, ITP, h/o right breast infiltrating ductal carcinoma, DJD, and GERD here for follow-up.    Plan:     # Health maintenance - Got glasses and asked pt to have her optometrist/ophthalmologist fax records

## 2012-06-06 DIAGNOSIS — K143 Hypertrophy of tongue papillae: Secondary | ICD-10-CM | POA: Insufficient documentation

## 2012-06-06 NOTE — Assessment & Plan Note (Signed)
Likely due to GERD. Whitish uniform color to entire tongue with no removable exudate, bleeding, erythema, induration, or erosion. - Less likely cancerous given uniform appearance - Possibly related to GERD - Continue higher dose omeprazole 40mg  BID x 2-3 more weeks and re-evaluate - Biotene mouthwash has not helped - Try nystatin if no improvement with omeprazole at f/u

## 2012-06-06 NOTE — Assessment & Plan Note (Addendum)
Continues to be elevated on current regimen of norvasc 5mg  and coreg 25mg  BID. - Start HCTZ 25mg  daily, may also help with LE complaint of "heaviness" - F/u in 2 weeks to discuss BP  - Weight loss goals: Will meet with Dr. Gerilyn Pilgrim or come up with 1 obtainable goal before next visit; already lost 3 lbs cutting back on soda - Check BMET at f/u

## 2012-06-06 NOTE — Assessment & Plan Note (Signed)
-   Check CBC at f/u

## 2012-06-06 NOTE — Assessment & Plan Note (Signed)
Likely cause of current hand pain given symptoms and exam. - Tylenol PRN pain and tramadol as rescue medication - At f/u if needing tramadol frequently, consider scheduling tylenol - Prescribed 3 more months tramadol

## 2012-06-19 ENCOUNTER — Other Ambulatory Visit: Payer: Self-pay | Admitting: Emergency Medicine

## 2012-06-19 ENCOUNTER — Other Ambulatory Visit: Payer: Self-pay | Admitting: Medical Oncology

## 2012-06-19 ENCOUNTER — Encounter: Payer: Self-pay | Admitting: Family Medicine

## 2012-06-19 ENCOUNTER — Ambulatory Visit (INDEPENDENT_AMBULATORY_CARE_PROVIDER_SITE_OTHER): Payer: BC Managed Care – PPO | Admitting: Family Medicine

## 2012-06-19 VITALS — BP 139/82 | HR 80 | Temp 98.2°F | Ht 63.0 in | Wt 211.4 lb

## 2012-06-19 DIAGNOSIS — K143 Hypertrophy of tongue papillae: Secondary | ICD-10-CM

## 2012-06-19 DIAGNOSIS — R109 Unspecified abdominal pain: Secondary | ICD-10-CM

## 2012-06-19 DIAGNOSIS — I10 Essential (primary) hypertension: Secondary | ICD-10-CM

## 2012-06-19 DIAGNOSIS — D693 Immune thrombocytopenic purpura: Secondary | ICD-10-CM

## 2012-06-19 DIAGNOSIS — Z862 Personal history of diseases of the blood and blood-forming organs and certain disorders involving the immune mechanism: Secondary | ICD-10-CM

## 2012-06-19 DIAGNOSIS — M1990 Unspecified osteoarthritis, unspecified site: Secondary | ICD-10-CM

## 2012-06-19 LAB — BASIC METABOLIC PANEL
Calcium: 9.7 mg/dL (ref 8.4–10.5)
Glucose, Bld: 130 mg/dL — ABNORMAL HIGH (ref 70–99)
Sodium: 140 mEq/L (ref 135–145)

## 2012-06-19 MED ORDER — NYSTATIN 100000 UNIT/ML MT SUSP: 500000.0000 [IU] | Freq: Four times a day (QID) | OROMUCOSAL | Status: DC

## 2012-06-19 MED ORDER — TAMOXIFEN CITRATE 20 MG PO TABS: 20.0000 mg | ORAL_TABLET | Freq: Every day | ORAL | Status: DC

## 2012-06-19 NOTE — Progress Notes (Signed)
Subjective:     Patient ID: Deniyah Dillavou, female   DOB: 12/29/1960, 52 y.o.   MRN: [id number]  CC - Follow-up BP and tongue film  HPI - Aerith Canal is a 52 y.o. female with h/o HTN, abdominal spasm, and tongue whitish film here for follow-up.  1. Hypertension - Patient takes coreg, amlodipine, and HCTZ and reports not missing and doses. BP at home have been 120-150s/70-90s, on average SBP 130s. Has lost 5 lbs since April 1 by limiting sodas, planning meals, and walking 3x/week for 30 minutes-1hour. Goal is to increase to 4x/week and use friend's exercise equipment.  2. Abdominal spasm - Uses omeprazole daily and reports no more abdominal discomfort or burping.   3. Whitish film on Tongue - Tried omeprazole 40 BID and biotene, neither of which is helping with uniform whitish coating on tongue that she cannot wipe off. She also feels she cannot taste as well, and can feel the coating. Denies pain, bleeding, itching, fevers, chills, sore throat, stomach discomfort, burning, burping. Has taken omeprazole >1 month. Reports drinking plenty of water.  4. Hand pain - Continues to have mild pain at knot on left hand. Knots swell occasionally and hurt. Fingers are not sore, and denies joint swelling. Tylenol helps but only takes once weekly. Ends up taking tramadol once weekly as well.    Review of Systems - Per HPI. All other systems reviewed and negative.  Past Medical History  Diagnosis Date  . Hypertension   . Hyperlipidemia   . Thrombocytopenia   . Diabetes mellitus   . Arthritis   . Breast CA     breast CA- right breast  . Thyroid disease     parathyroid removed  . Heel spur     bilateral       Objective:   Physical Exam BP 139/82  Pulse 80  Temp(Src) 98.2 F (36.8 C) (Oral)  Ht 5\' 3"  (1.6 m)  Wt 211 lb 6 oz (95.879 kg)  BMI 37.45 kg/m2 GEN: NAD EXTR: Left lateral wrist with firm osteophyte, painful; no joint swelling, no erythema or effusion HEENT: Tongue with thin  whitish film unable to be removed, uniform    Assessment:     Wileen Duncanson is a 52 y.o. female with h/o HTN, abdominal spasm, and tongue whitish film here for follow-up.    Plan:     # Health Maintenance: - Reminded patient to bring ophtho records at follow-up   # See Problem List for Problem-specific plans

## 2012-06-19 NOTE — Patient Instructions (Addendum)
It was great to see you today [name]!  For your tongue, try nystatin mouth wash that you will put in your mouth, swish for 30 sec- 1 min, and spit out four times/day. - Follow up in 2 weeks to see how your mouth is doing.  For your blood pressure, you are doing great. Keep up the good work. - Let me know when you need med refills 2 weeks before running out if no refills left. - Bring all medicines to your next visit with me. - Goal: Walk 4x/week, use the exercise equipment at friend's apt.  For your hand pain, I still think this is osteoarthritis.  - You can take tylenol more than once weekly, just try not to take daily if you can avoid it. - We will follow up when I see you next. - If you develop a swollen joint or fevers, see me sooner or go to the Emergency Room.  Get your optometrist to send me records.  We are checking some labs today, and I will call you if they are abnormal. If you do not hear from me with a call or letter in 2 weeks, please call us.  If you have chest pain, fevers, shortness of breath, or other concerning symptoms, seek immediate medical attention.

## 2012-06-20 LAB — CBC
MCH: 25.4 pg — ABNORMAL LOW (ref 26.0–34.0)
MCHC: 33.4 g/dL (ref 30.0–36.0)
Platelets: 76 10*3/uL — ABNORMAL LOW (ref 150–400)
RDW: 14.9 % (ref 11.5–15.5)

## 2012-06-22 NOTE — Assessment & Plan Note (Signed)
Still unresolved - Will try nystatin oral solution swish 30sec-98min QID - F/u in 2 weeks; if no improvement, eval for leukoplakia or other cause

## 2012-06-22 NOTE — Assessment & Plan Note (Signed)
Well controlled today on HCTZ daily since 5/2, Norvasc 5mg  and coreg 25mg . For weight loss efforts, is not ready yet to work with Dr. Gerilyn Pilgrim and wants to plan goals herself. - Checking BMET today - Continue current regimen - Continue weight loss efforts. Goal today for next visit is: use exercise equipment at friend's apartment - f/u in 2 weeks

## 2012-06-22 NOTE — Assessment & Plan Note (Signed)
Checking CBC today 

## 2012-06-22 NOTE — Assessment & Plan Note (Signed)
Symptoms of GERD much improved. - Will take omeprazole 2 more weeks and then try to discontinue; if symptoms return, will restart at 20mg .

## 2012-06-22 NOTE — Assessment & Plan Note (Signed)
OA most likely. Gout vs cyst also possible. Still painful, but only using tylenol once weekly - Recommended Tylenol more than 1-2x/week - Follow in 2 weeks

## 2012-07-03 ENCOUNTER — Ambulatory Visit: Payer: BC Managed Care – PPO | Admitting: Family Medicine

## 2012-07-13 ENCOUNTER — Encounter: Payer: Self-pay | Admitting: Family Medicine

## 2012-08-21 ENCOUNTER — Other Ambulatory Visit (HOSPITAL_BASED_OUTPATIENT_CLINIC_OR_DEPARTMENT_OTHER): Payer: BC Managed Care – PPO | Admitting: Lab

## 2012-08-21 ENCOUNTER — Ambulatory Visit (HOSPITAL_BASED_OUTPATIENT_CLINIC_OR_DEPARTMENT_OTHER): Payer: BC Managed Care – PPO | Admitting: Oncology

## 2012-08-21 ENCOUNTER — Encounter: Payer: Self-pay | Admitting: Oncology

## 2012-08-21 ENCOUNTER — Telehealth: Payer: Self-pay | Admitting: *Deleted

## 2012-08-21 VITALS — BP 145/79 | HR 105 | Temp 98.3°F | Resp 20 | Ht 63.0 in | Wt 204.6 lb

## 2012-08-21 DIAGNOSIS — D6949 Other primary thrombocytopenia: Secondary | ICD-10-CM

## 2012-08-21 DIAGNOSIS — C50919 Malignant neoplasm of unspecified site of unspecified female breast: Secondary | ICD-10-CM

## 2012-08-21 DIAGNOSIS — D693 Immune thrombocytopenic purpura: Secondary | ICD-10-CM | POA: Diagnosis not present

## 2012-08-21 DIAGNOSIS — E876 Hypokalemia: Secondary | ICD-10-CM | POA: Diagnosis not present

## 2012-08-21 LAB — CBC WITH DIFFERENTIAL/PLATELET
BASO%: 0.5 % (ref 0.0–2.0)
HCT: 41.5 % (ref 34.8–46.6)
HGB: 13.6 g/dL (ref 11.6–15.9)
MCHC: 32.8 g/dL (ref 31.5–36.0)
MONO#: 0.5 10*3/uL (ref 0.1–0.9)
NEUT#: 3.2 10*3/uL (ref 1.5–6.5)
NEUT%: 50.8 % (ref 38.4–76.8)
WBC: 6.4 10*3/uL (ref 3.9–10.3)
lymph#: 2.5 10*3/uL (ref 0.9–3.3)
nRBC: 0 % (ref 0–0)

## 2012-08-21 LAB — COMPREHENSIVE METABOLIC PANEL (CC13)
ALT: 44 U/L (ref 0–55)
AST: 45 U/L — ABNORMAL HIGH (ref 5–34)
Albumin: 3.5 g/dL (ref 3.5–5.0)
BUN: 9.7 mg/dL (ref 7.0–26.0)
Calcium: 9.3 mg/dL (ref 8.4–10.4)
Chloride: 98 mEq/L (ref 98–109)
Potassium: 3.1 mEq/L — ABNORMAL LOW (ref 3.5–5.1)

## 2012-08-21 MED ORDER — POTASSIUM CHLORIDE CRYS ER 20 MEQ PO TBCR: 20.0000 meq | EXTENDED_RELEASE_TABLET | Freq: Two times a day (BID) | ORAL | Status: DC

## 2012-08-21 MED ORDER — TAMOXIFEN CITRATE 20 MG PO TABS: 20.0000 mg | ORAL_TABLET | Freq: Every day | ORAL | Status: DC

## 2012-08-21 NOTE — Progress Notes (Signed)
ID: [name]   DOB: March 11, 1960  MR#: [id number]  NWG#:[id number]  PCP: Simone Curia, MD  DIAGNOSIS: 53 year old female with invasive ductal carcinoma of the right breast originally diagnosed in June 2011. With subsequent subcutaneous recurrence   PRIOR THERAPY:   #1 patient initially underwent a right mastectomy in June 2011 with the final pathology revealing 2 foci of invasive ductal carcinoma, comment 1 measuring 2.2 cm and a second one measuring 0.5 cm. The tumor was ER positive PR positive HER-2/neu negative with a proliferation marker 11%.   #2 the patient had Oncotype DX testing done that revealed recurrence score of 27 with 18% her risk of distant recurrence with tamoxifen alone.   #3 patient had adjuvant chemotherapy consisting of Taxotere and Cytoxan every 3 weeks for a total of 4 cycles from 10/23/2009 2 12/25/2009.   #4 patient had reconstruction performed by Dr. Etter Sjogren.   #5 she was then begun on tamoxifen 20 mg daily in January 2012.   #6 patient then developed a subcutaneous nodule at the site of her original mastectomy scar. This was resected. She then went on to have radiation therapy locally to this to the mastectomy site.   #7 patient continues on tamoxifen 20 mg daily.   #8 Effexor XR 37.5 mg daily for hot flashes   CURRENT THERAPY: Tamoxifen 20 mg daily  ECOG PERFORMANCE STATUS: 1 - Symptomatic but completely ambulatory  INTERVAL HISTORY:[name] 52 y.o. female returns for a follow up appointment.  SHe is doing well except for intermittent hot flashes improved with Effexor. She has some intermittent joint pain,Denies any nausea, vomiting, fatigue, vaginal discharge, new masses during self exam, respiratory or cardiac complaints. No xerostomia.    REVIEW OF SYSTEMS: Remainder of the 10 point ROS is negative.  PAST MEDICAL HISTORY: Past Medical History  Diagnosis Date  . Hypertension   . Hyperlipidemia   . Thrombocytopenia   . Diabetes  mellitus   . Arthritis   . Breast CA     breast CA- right breast  . Thyroid disease     parathyroid removed  . Heel spur     bilateral    PAST SURGICAL HISTORY: Past Surgical History  Procedure Laterality Date  . Breast surgery  09/12/10    excision of skin mass - right breast, had another surgery to remove more Cancer cells  . Parathyroidectomy      partial?  . Abdominal hysterectomy      1996 per pt    FAMILY HISTORY Family History  Problem Relation Age of Onset  . Heart disease Mother   . Stroke Father   . Heart disease Brother   . Colon cancer Neg Hx   . Esophageal cancer Neg Hx   . Rectal cancer Neg Hx   . Stomach cancer Neg Hx     GHEALTH MAINTENANCE: History  Substance Use Topics  . Smoking status: Never Smoker   . Smokeless tobacco: Never Used  . Alcohol Use: No      No Known Allergies  Current Outpatient Prescriptions  Medication Sig Dispense Refill  . Acetaminophen 650 MG TABS Take 1 tablet (650 mg total) by mouth 3 (three) times daily as needed.  30 tablet  0  . amLODipine (NORVASC) 5 MG tablet Take 1 tablet (5 mg total) by mouth daily.  30 tablet  11  . carvedilol (COREG) 25 MG tablet Take 1 tablet (25 mg total) by mouth 2 (two) times daily with a meal.  60 tablet  11  . hydrochlorothiazide (HYDRODIURIL) 25 MG tablet Take 1 tablet (25 mg total) by mouth daily.  30 tablet  2  . metFORMIN (GLUCOPHAGE) 1000 MG tablet Take 0.5 tablets (500 mg total) by mouth daily with breakfast.  30 tablet  2  . omeprazole (PRILOSEC) 40 MG capsule Take 1 capsule (40 mg total) by mouth 2 (two) times daily.  60 capsule  0  . pravastatin (PRAVACHOL) 40 MG tablet Take 1 tablet (40 mg total) by mouth every evening.  30 tablet  2  . tamoxifen (NOLVADEX) 20 MG tablet Take 1 tablet (20 mg total) by mouth daily.  30 tablet  6  . traMADol (ULTRAM) 50 MG tablet Take 1 tablet (50 mg total) by mouth daily as needed for pain.  30 tablet  2  . venlafaxine XR (EFFEXOR-XR) 37.5 MG 24 hr  capsule Take 1 capsule (37.5 mg total) by mouth daily.  30 capsule  2  . cyclobenzaprine (FLEXERIL) 5 MG tablet Take 1 tablet (5 mg total) by mouth 3 (three) times daily as needed for muscle spasms.  30 tablet  1  . potassium chloride SA (K-DUR,KLOR-CON) 20 MEQ tablet Take 1 tablet (20 mEq total) by mouth 2 (two) times daily.  10 tablet  0   No current facility-administered medications for this visit.    OBJECTIVE: Filed Vitals:   08/21/12 0843  BP: 145/79  Pulse: 105  Temp: 98.3 F (36.8 C)  Resp: 20     Body mass index is 36.25 kg/(m^2).       GENERAL: Well developed, well nourished, in no acute distress.  EENT: No ocular or oral lesions. No stomatitis.  RESPIRATORY: Lungs are clear to auscultation bilaterally, no wheezing, rhonchi or rales. CARDIAC: regular rate and rhythm, no murmurs, rubs or gallops. GI: Abdomen is soft, non tender,  no palpable hepatosplenomegaly. No fluid wave. Musculoskeletal:No focal spinal tenderness, no peripheral edema.bilateral foot discomfort [facility]. Lymph: No cervical, infraclavicular, axillary or inguinal adenopathy. Neuro: No focal neurological deficits. Psych: Alert and oriented X 3, appropriate mood and affect.  BREAST EXAM: Left breast no masses or nipple discharge no skin changes. Right mastectomy scar looks well healed there is changes from radiation otherwise no masses no nodularity.  LAB RESULTS: Lab Results  Component Value Date   WBC 6.4 08/21/2012   NEUTROABS 3.2 08/21/2012   HGB 13.6 08/21/2012   HCT 41.5 08/21/2012   MCV 76.9* 08/21/2012   PLT 101* 08/21/2012      Chemistry      Component Value Date/Time   NA 141 08/21/2012 0750   NA 140 06/19/2012 1016   K 3.1 Repeated and Verified* 08/21/2012 0750   K 3.6 06/19/2012 1016   CL 99 06/19/2012 1016   CL 105 02/21/2012 0806   CO2 29 08/21/2012 0750   CO2 30 06/19/2012 1016   BUN 9.7 08/21/2012 0750   BUN 11 06/19/2012 1016   CREATININE 0.9 08/21/2012 0750    CREATININE 0.82 06/19/2012 1016   CREATININE 0.82 07/22/2011 0801      Component Value Date/Time   CALCIUM 9.3 08/21/2012 0750   CALCIUM 9.7 06/19/2012 1016   ALKPHOS 62 08/21/2012 0750   ALKPHOS 75 05/04/2012 1027   AST 45* 08/21/2012 0750   AST 23 05/04/2012 1027   ALT 44 08/21/2012 0750   ALT 23 05/04/2012 1027   BILITOT 0.55 08/21/2012 0750   BILITOT 0.3 05/04/2012 1027       Lab Results  Component  Value Date   LABCA2 28 09/18/2010    Urinalysis    Component Value Date/Time   COLORURINE YELLOW 07/17/2009 1512   APPEARANCEUR CLEAR 07/17/2009 1512   LABSPEC 1.018 07/17/2009 1512   PHURINE 5.5 07/17/2009 1512   GLUCOSEU NEGATIVE 07/17/2009 1512   HGBUR TRACE* 07/17/2009 1512   BILIRUBINUR NEG 05/17/2011 1537   BILIRUBINUR NEGATIVE 07/17/2009 1512   KETONESUR NEGATIVE 07/17/2009 1512   PROTEINUR NEGATIVE 07/17/2009 1512   UROBILINOGEN 2.0 05/17/2011 1537   UROBILINOGEN 0.2 07/17/2009 1512   NITRITE NEG 05/17/2011 1537   NITRITE NEGATIVE 07/17/2009 1512   LEUKOCYTESUR Negative 05/17/2011 1537    STUDIES: No results found.  Filed Weights   08/21/12 0843  Weight: 204 lb 9.6 oz (92.806 kg)    ASSESSMENT/ PLAN: 19 year oldd female with #1 right breast cancer status post mastectomy with subsequent local recurrence which was resected followed by radiation therapy.She is continuing tamoxifen 20 mg daily. She is to return in a follow up visit in 6 months with labs.  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 No bleeding issues  #3 hot flashes patient is on Effexor XR 37.5 mg on a daily basis, improved symptoms.  #4 The patient is to undergo a routine colonoscopy in the near future.  All questions were answered. The patient knows to call the clinic with any problems, questions or concerns. We can certainly see the patient much sooner if necessary.  Time spent 25 minutes counseling the patient face to face. The total time spent in the appointment was 30 minutes.    Drue Second     08/21/2012

## 2012-08-21 NOTE — Telephone Encounter (Signed)
appts made and printed...td 

## 2012-08-21 NOTE — Patient Instructions (Addendum)
Doing well  Potassium is low . I have sent a prescription to your pharmacy for potassium tablets. Take 1 twice a day for 5 days  Refilled your tamoxifen  I will see you back in 6 months

## 2012-09-08 ENCOUNTER — Other Ambulatory Visit: Payer: Self-pay | Admitting: Family Medicine

## 2012-09-15 ENCOUNTER — Other Ambulatory Visit: Payer: Self-pay | Admitting: *Deleted

## 2012-09-15 DIAGNOSIS — C50919 Malignant neoplasm of unspecified site of unspecified female breast: Secondary | ICD-10-CM

## 2012-09-15 MED ORDER — VENLAFAXINE HCL ER 37.5 MG PO CP24: 37.5000 mg | ORAL_CAPSULE | Freq: Every day | ORAL | Status: DC

## 2012-09-24 ENCOUNTER — Encounter: Payer: Self-pay | Admitting: Family Medicine

## 2012-09-24 ENCOUNTER — Ambulatory Visit (INDEPENDENT_AMBULATORY_CARE_PROVIDER_SITE_OTHER): Payer: BC Managed Care – PPO | Admitting: Family Medicine

## 2012-09-24 VITALS — BP 126/84 | HR 83 | Temp 98.3°F | Ht 63.0 in | Wt 206.7 lb

## 2012-09-24 DIAGNOSIS — R7303 Prediabetes: Secondary | ICD-10-CM

## 2012-09-24 DIAGNOSIS — K143 Hypertrophy of tongue papillae: Secondary | ICD-10-CM

## 2012-09-24 DIAGNOSIS — R7309 Other abnormal glucose: Secondary | ICD-10-CM

## 2012-09-24 MED ORDER — METFORMIN HCL ER 500 MG PO TB24: 500.0000 mg | ORAL_TABLET | Freq: Every day | ORAL | Status: DC

## 2012-09-24 NOTE — Progress Notes (Signed)
Patient ID: [name], female   DOB: 01/06/1961, 52 y.o.   MRN: [id number] Subjective:   CC: Metformin medication question  HPI:   1. Prediabetes medication question - Pt comes in requesting medication change from metformin due to diarrhea. She is amenable to changing to extended release formulation. Of note, she has lost about 10 lbs since January 2014 due to increased walking daily and cutting down on soft drinks/sweet beverages. She has briefly stopped walking recently due to walking partner being diagnosed with stomach cancer, but is resolved to resume walking soon. Denies other medication side effects.  2. Tongue coating - since last visit, this has resolved with nystatin oral solution and patient is satisfied.  Review of Systems - Per HPI.   SH: Increase in exercise (walking daily) though recently stopped, per above. Cut down on sweet beverages.    Objective:  Physical Exam BP 126/84  Pulse 83  Temp(Src) 98.3 F (36.8 C) (Oral)  Ht 5\' 3"  (1.6 m)  Wt 206 lb 11.2 oz (93.759 kg)  BMI 36.62 kg/m2 GEN: NAD HEENT: Atraumatic, normocephalic, neck supple, EOMI, sclera clear  SKIN: No rash or cyanosis; warm and well-perfused EXTR: No lower extremity edema or calf tenderness PSYCH: Mood and affect euthymic, normal rate and volume of speech NEURO: Awake, alert, no focal deficits grossly, normal speech      Assessment:     [name] is a 52 y.o. female with h/o prediabetes here for medication adjustment.    Plan:     # See problem list for problem-specific plans. - 1 month for prediabetes/weight f/u and health maintenance

## 2012-09-24 NOTE — Patient Instructions (Addendum)
I am changing you from regular to extended release metformin to help with the diarrhea, and am decreasing to the appriate 500mg  daily. Come back to see me in 1 month or whenever is convenient for you and we can discuss your weight loss and recheck an A1c at that time.  Good work with the weight loss. You are down 10 lbs since January!!  When you come back, let me know what your daily diet has been and what exercise you have done (keep a journal).  1 month to follow up on diabetes and health maintenance.

## 2012-09-24 NOTE — Assessment & Plan Note (Signed)
>>  ASSESSMENT AND PLAN FOR DM (DIABETES MELLITUS) WITH COMPLICATIONS (HCC) WRITTEN ON 09/24/2012  6:14 PM BY THEKKEKANDAM, Danielle T, MD  Pt takes metformin  500mg  daily and is getting diarrhea from this. Has lost 10 lbs since Jan 2014 with diet and exercise. - Switch to metformin  extended release 500mg  daily. Ordered. - Follow up in 1 month to check A1c, review exercise journal and daily diet, and for health maintenance

## 2012-09-24 NOTE — Assessment & Plan Note (Signed)
Resolved since last visit. - No further f/u unless recurs.

## 2012-09-24 NOTE — Assessment & Plan Note (Addendum)
Pt takes metformin 500mg  daily and is getting diarrhea from this. Has lost 10 lbs since Jan 2014 with diet and exercise. - Switch to metformin extended release 500mg  daily. Ordered. - Follow up in 1 month to check A1c, review exercise journal and daily diet, and for health maintenance

## 2012-10-12 ENCOUNTER — Telehealth: Payer: Self-pay | Admitting: Family Medicine

## 2012-10-12 MED ORDER — TRAMADOL HCL 50 MG PO TABS: 50.0000 mg | ORAL_TABLET | Freq: Every day | ORAL | Status: DC | PRN

## 2012-10-12 NOTE — Telephone Encounter (Signed)
Pt says the [facility] on [address] needs a prescription authorized for tramado. Please advise

## 2012-10-12 NOTE — Telephone Encounter (Signed)
Called patient to ask about tramadol use. She states she is using for hand which still hurts and and for body aches/stiffness all over.  - Hand pain: Hand still has knots and she cannot turn anything in her hand.  - Body pain: New, present for 2 weeks. she has never felt this body stiffness/achiness before. Two tylenol daily do not help, and she has needed tramadol twice weekly to ease the pain. She denies red/swollen muscles/joints or fevers/chills.  No workup for RA in the past that she knows. Still able to do daily activities. Lasts 15 minutes in morning and comes back if sits long time. Denies muscle weakness. Thinks it may be from cancer treatment.  A: Hand pain and joint stiffness possibly OA, but would need ofc visit to eval other causes. No joint swelling/inflammation or fevers/chills. P: - Will refill tramadol 50mg  tabs #20 tab with 0 refills, no further refills until ofc visit. - Ofc visit to eval this week and to consider any further workup for joint stiffness - Return precautions reviewed  Leona Singleton, MD

## 2012-10-19 ENCOUNTER — Ambulatory Visit: Payer: BC Managed Care – PPO | Admitting: Family Medicine

## 2012-11-13 ENCOUNTER — Encounter: Payer: Self-pay | Admitting: Family Medicine

## 2012-11-13 ENCOUNTER — Ambulatory Visit (INDEPENDENT_AMBULATORY_CARE_PROVIDER_SITE_OTHER): Payer: BC Managed Care – PPO | Admitting: Family Medicine

## 2012-11-13 VITALS — BP 133/76 | HR 94 | Temp 98.8°F | Wt 203.4 lb

## 2012-11-13 DIAGNOSIS — R7402 Elevation of levels of lactic acid dehydrogenase (LDH): Secondary | ICD-10-CM

## 2012-11-13 DIAGNOSIS — R7303 Prediabetes: Secondary | ICD-10-CM

## 2012-11-13 DIAGNOSIS — Z23 Encounter for immunization: Secondary | ICD-10-CM | POA: Diagnosis not present

## 2012-11-13 DIAGNOSIS — R7309 Other abnormal glucose: Secondary | ICD-10-CM | POA: Diagnosis not present

## 2012-11-13 DIAGNOSIS — Z0000 Encounter for general adult medical examination without abnormal findings: Secondary | ICD-10-CM

## 2012-11-13 DIAGNOSIS — M1990 Unspecified osteoarthritis, unspecified site: Secondary | ICD-10-CM

## 2012-11-13 DIAGNOSIS — R7401 Elevation of levels of liver transaminase levels: Secondary | ICD-10-CM | POA: Insufficient documentation

## 2012-11-13 DIAGNOSIS — E785 Hyperlipidemia, unspecified: Secondary | ICD-10-CM

## 2012-11-13 LAB — COMPREHENSIVE METABOLIC PANEL
ALT: 30 U/L (ref 0–35)
CO2: 34 mEq/L — ABNORMAL HIGH (ref 19–32)
Calcium: 9.4 mg/dL (ref 8.4–10.5)
Chloride: 100 mEq/L (ref 96–112)
Sodium: 140 mEq/L (ref 135–145)
Total Protein: 6.8 g/dL (ref 6.0–8.3)

## 2012-11-13 LAB — LIPID PANEL: LDL Cholesterol: 129 mg/dL — ABNORMAL HIGH (ref 0–99)

## 2012-11-13 MED ORDER — TRAMADOL HCL 50 MG PO TABS: 50.0000 mg | ORAL_TABLET | Freq: Every day | ORAL | Status: DC | PRN

## 2012-11-13 NOTE — Patient Instructions (Addendum)
Good to see you today!  For your prediabetes, - A1c today - Checking Lipids today - Keep doing what doing for weight - good job! - Please have your eye exam and have them send [place] records  Arthritis, - I will re-prescribe tramadol - Keep taking 2x/week or less when possible - F/u 2 weeks to discuss arthritis with me  Today we will do: Flu, pneumonia, and tetanus shots

## 2012-11-13 NOTE — Progress Notes (Signed)
Patient ID: [name], female   DOB: 1960/09/25, 52 y.o.   MRN: [id number] Subjective:   CC: Follow-up  HPI:   1. PreDM -  - Recently switched to extended release metformin 500mg  XR once daily due to diarrhea, improved with switch.  - Glucose 155 2 days ago 1 hour after meal.  - Denies signs of hypo- or hyperglycemia - Due for eye check and reports some blurred vision. No other complaints.  - Continues watching what she eats, cutting down on sodas from 20 oz pepsi TID to small can pepsi BID and water. Tries to walk three times / week 30 minute-1hr with friend or by self. Plans to walk in the mall when colder outside.  2. HLD: Has not been taking pravastatn because makes her have upset stomach, x1 month.  3. DJD - Pt reports still getting stiff in joints with pain requiring 1-2 times weekly tramadol (right ankle, bilateral 1st MCP joint, right hip). Pain is worse in AM when she feels very stiff, eases up when she moves around, but gets bd again when sits for long periods. Tylenol does not really help but she uses this in daytime and tramadol only when needed at nighttime. Denies hot red joints or fevers. Has never tried injection. Needs tramadol refill.  Review of Systems - Per HPI.   PMH: - No h/o joint injection  SH:  - Continues exercising and diet changes per above   Objective:  Physical Exam BP 133/76  Pulse 94  Temp(Src) 98.8 F (37.1 C) (Oral)  Wt 203 lb 6.4 oz (92.262 kg)  BMI 36.04 kg/m2 GEN: NAD, pleasant CV: RRR by pulse PULM: Normal effort NEURO: Normal speech and gait, no focal deficits DM foot exam: Normal inspection, 2+ bilateral DP pulse, difficult to find PT pulse bilaterally, monofilament test normal throughout Skin: No rash or cyanosis HEENT: Sclera clear, EOMI    Assessment:     [name] is a 52 y.o. female here for follow up of DM and HTN    Plan:     # See problem list for problem-specific plans. - Flu shot, pneumonia shot, tetanus  shot - Declines STD test - CMET to verify LFT normalizing since July and nl K

## 2012-11-14 NOTE — Assessment & Plan Note (Addendum)
Was on pravastatin, not taking x 1 month due to GI side effects - Check fasting lipid panel today and LFTs - With pre-DM, pending result, consider high-intensity statin

## 2012-11-14 NOTE — Assessment & Plan Note (Signed)
>>  ASSESSMENT AND PLAN FOR DM (DIABETES MELLITUS) WITH COMPLICATIONS (HCC) WRITTEN ON 11/14/2012  2:24 PM BY THEKKEKANDAM, MARIA T, MD  Last A1c in pre-DM range, pt is working on diet and exercise - F/u A1c today - Congratulated on 3 more lbs of weight loss and continue diet/exercise (216-->206-->203 lbs) - Foot exam today normal - Eye exam - Pt plans to call for this and asked pt to have records sent to us

## 2012-11-14 NOTE — Assessment & Plan Note (Addendum)
Worsened recently in hands (1st MCP), right hip, right ankle, no red flags (fever, hot swollen joints) - Refilled tramadol 1 month, try to limit to 2-3x/week - F/u when pt able in 2-3 weeks to discuss/evaluate for other dx and tx - never had injection

## 2012-11-14 NOTE — Assessment & Plan Note (Addendum)
Last A1c in pre-DM range, pt is working on diet and exercise - F/u A1c today - Congratulated on 3 more lbs of weight loss and continue diet/exercise (216-->206-->203 lbs) - Foot exam today normal - Eye exam - Pt plans to call for this and asked pt to have records sent to [place]

## 2012-11-23 ENCOUNTER — Telehealth: Payer: Self-pay | Admitting: Family Medicine

## 2012-11-23 DIAGNOSIS — E785 Hyperlipidemia, unspecified: Secondary | ICD-10-CM

## 2012-11-23 NOTE — Telephone Encounter (Signed)
Pt called and would like someone to call her about her lab results. JW

## 2012-11-23 NOTE — Telephone Encounter (Signed)
Will forward to MD. Jazmin Hartsell,CMA  

## 2012-11-25 MED ORDER — ATORVASTATIN CALCIUM 40 MG PO TABS: 40.0000 mg | ORAL_TABLET | Freq: Every day | ORAL | Status: DC

## 2012-11-25 NOTE — Telephone Encounter (Signed)
Left message for patient to call office back for results. When she calls, please explain to her that  - Her cholesterol alone puts her in a low-risk category for having a heart attack or stroke in the next 10 years, but because she is on the borderline with diabetes, this makes her a higher risk (if diabetic, 12% 10 year ASCVD risk) so I would encourage she restart a cholesterol medicine. I am prescribing atorvastatin 40mg  daily - she can let me know if this is too expensive but otherwise take daily.  It is a little better than simvastatin, which I think she was on previously. Possible side effects include muscle aches. If this occurs, she should call and let us know. - Her A1c is about the same as it was before (6.1 to 6.1). Keep working on weight loss. - Her Liver function tests were normal. - Her potassium is mildly low but I think we can just follow this. She can also eat high-potassium foods like melons and bananas.  She should feel free to call me with any questions.  Leona Singleton, MD 11/25/2012 12:00 PM

## 2012-12-08 ENCOUNTER — Ambulatory Visit: Payer: BC Managed Care – PPO | Admitting: Family Medicine

## 2012-12-15 ENCOUNTER — Other Ambulatory Visit: Payer: Self-pay | Admitting: Family Medicine

## 2012-12-15 ENCOUNTER — Telehealth: Payer: Self-pay | Admitting: Family Medicine

## 2012-12-15 DIAGNOSIS — I10 Essential (primary) hypertension: Secondary | ICD-10-CM

## 2012-12-15 MED ORDER — CARVEDILOL 25 MG PO TABS
25.0000 mg | ORAL_TABLET | Freq: Two times a day (BID) | ORAL | Status: DC
Start: 2012-12-15 — End: 2014-01-04

## 2012-12-15 MED ORDER — HYDROCHLOROTHIAZIDE 25 MG PO TABS: ORAL_TABLET | ORAL | Status: DC

## 2012-12-15 NOTE — Telephone Encounter (Signed)
Went to pharmacy to get blood pressure pills but dr t hasnt called in the prescription Pharmacy: [facility] on [address]

## 2012-12-15 NOTE — Telephone Encounter (Signed)
Sent coreg and hctz prescriptions - I think these were the ones that had expired. Please let her know. Thanks! Leona Singleton, MD 12/15/2012 6:33 PM

## 2012-12-16 NOTE — Telephone Encounter (Signed)
Pt is aware.  Jazmin Hartsell,CMA  

## 2013-01-06 ENCOUNTER — Encounter: Payer: Self-pay | Admitting: Family Medicine

## 2013-01-06 ENCOUNTER — Ambulatory Visit (INDEPENDENT_AMBULATORY_CARE_PROVIDER_SITE_OTHER): Payer: BC Managed Care – PPO | Admitting: Family Medicine

## 2013-01-06 VITALS — BP 139/81 | HR 94 | Temp 98.4°F | Ht 63.0 in | Wt 202.0 lb

## 2013-01-06 DIAGNOSIS — M1990 Unspecified osteoarthritis, unspecified site: Secondary | ICD-10-CM

## 2013-01-06 DIAGNOSIS — H6120 Impacted cerumen, unspecified ear: Secondary | ICD-10-CM | POA: Insufficient documentation

## 2013-01-06 DIAGNOSIS — H6123 Impacted cerumen, bilateral: Secondary | ICD-10-CM

## 2013-01-06 MED ORDER — TRAMADOL HCL 50 MG PO TABS: 50.0000 mg | ORAL_TABLET | Freq: Every day | ORAL | Status: DC | PRN

## 2013-01-06 MED ORDER — CARBAMIDE PEROXIDE 6.5 % OT SOLN: 10.0000 [drp] | Freq: Two times a day (BID) | OTIC | Status: DC

## 2013-01-06 NOTE — Patient Instructions (Signed)
Good to see you today.  For your hand pain, we are collecting labs and I want you to get xrays of both hands at [facility]. I will call you if labs are NOT normal. Otherwise, follow up in 1-2 weeks to discuss labs and xray and we can decide if we need to do further workup. Follow up in 2w-95m to follow up on blood pressure. You are getting your ears cleaned today.   Leona Singleton, MD

## 2013-01-06 NOTE — Progress Notes (Signed)
Patient ID: [name], female   DOB: 11/22/1960, 52 y.o.   MRN: [id number] Subjective:   CC: Hand pain  HPI:   1. Hand pain - Pt presents for follow-up of ~1 year of bilateral hand pain. Leading diagnosis to this point has been DJD, but patient has not had hand imaging. Pain is prominent in left thumb (DIP joint where there is a small nodule and PIP and MCP) and right 2nd digit DIP where there is a small nodule. Since last visit, pain has gotten worse. It hurts worse when she washes dishes with hands in warm water, and when hands are cold. Denies joint redness/swelling, fevers, or chills. Pain is 10/10, throbbing, and wakes her from sleep. Had been using tramadol 2-3 times weekly but now is using nearly daily, and it helps. Uses tylenol which eases pain a little for a short time. Aspercreme-type product (pt doesn't know name) helps too. Right hip also hurts.   Pt has never had imaging, lab workup, or injection.  2. Pt received an abnormal hearing free screen. She thinks she hears fine. She does use qtips.  Review of Systems - Per HPI.   PMH: - Recently changed to lipitor, took for 1 month, stopped 2 weeks ago because started having intense muscle pain that she did not have with pravastatin.     Objective:  Physical Exam BP 139/81  Pulse 94  Temp(Src) 98.4 F (36.9 C) (Oral)  Ht 5\' 3"  (1.6 m)  Wt 202 lb (91.627 kg)  BMI 35.79 kg/m2 GEN: NAD, WD/WN, pleasant EXTR: Bilateral hand joint tenderness in left thumb DIP, PIP, and MCP and right 2nd digit DIP with small palpable firm bony nodules. Good grip strength bilaterally. No erythema or swelling. HEENT: Bilateral TM obscured by cerumen impaction  Assessment:     [name] is a 52 y.o. female here for follow up of hand pain.    Plan:     # See problem list and after visit summary for problem-specific plans. - Of note,BP today mildly elevated. Rediscuss in 2w-73m at follow up  # Health Maintenance: - On f/u, find out if pt  got eye exam - Decide about statin given intolerance to lipitor; could trial crestor. Discuss at f/u.  Follow-up: Follow up in 1-2 weeks for review of xrays and labwork for hand pain. Follow up 2w-50mo to discuss blood pressure.    Leona Singleton, MD [facility]

## 2013-01-07 LAB — ANA: Anti Nuclear Antibody(ANA): NEGATIVE

## 2013-01-07 LAB — C-REACTIVE PROTEIN: CRP: <0.5 mg/dL (ref <0.60)

## 2013-01-08 NOTE — Assessment & Plan Note (Signed)
Afebrile and joints not appearing inflamed but tender; Still most likely DJD but also consider rheumatoid arthritis or other rheumatologic condition.  - Bilateral hand x-rays - ANA, CRP, RF - Consider ESR, CRP, joint aspiration at f/u

## 2013-01-08 NOTE — Assessment & Plan Note (Signed)
Likely cause of abnormal hearing screen. - Debrox - Attempted cleaning out but not successful - On return, can reattempt cleaning out - Do not use qtips

## 2013-01-12 ENCOUNTER — Ambulatory Visit (HOSPITAL_COMMUNITY)
Admission: RE | Admit: 2013-01-12 | Discharge: 2013-01-12 | Disposition: A | Discharge status: Home / Self Care | Payer: BC Managed Care – PPO | Source: Ambulatory Visit | Attending: Family Medicine | Admitting: Family Medicine

## 2013-01-12 DIAGNOSIS — M19049 Primary osteoarthritis, unspecified hand: Secondary | ICD-10-CM | POA: Diagnosis not present

## 2013-01-12 DIAGNOSIS — M1990 Unspecified osteoarthritis, unspecified site: Secondary | ICD-10-CM

## 2013-01-12 DIAGNOSIS — M19039 Primary osteoarthritis, unspecified wrist: Secondary | ICD-10-CM | POA: Diagnosis not present

## 2013-01-12 DIAGNOSIS — M79609 Pain in unspecified limb: Secondary | ICD-10-CM | POA: Diagnosis present

## 2013-01-21 ENCOUNTER — Encounter: Payer: Self-pay | Admitting: Family Medicine

## 2013-01-21 ENCOUNTER — Ambulatory Visit (INDEPENDENT_AMBULATORY_CARE_PROVIDER_SITE_OTHER): Payer: BC Managed Care – PPO | Admitting: Family Medicine

## 2013-01-21 VITALS — BP 128/83 | HR 97 | Temp 98.5°F | Wt 200.0 lb

## 2013-01-21 DIAGNOSIS — E785 Hyperlipidemia, unspecified: Secondary | ICD-10-CM

## 2013-01-21 DIAGNOSIS — M79643 Pain in unspecified hand: Secondary | ICD-10-CM

## 2013-01-21 DIAGNOSIS — H6120 Impacted cerumen, unspecified ear: Secondary | ICD-10-CM

## 2013-01-21 DIAGNOSIS — I10 Essential (primary) hypertension: Secondary | ICD-10-CM

## 2013-01-21 DIAGNOSIS — M25549 Pain in joints of unspecified hand: Secondary | ICD-10-CM

## 2013-01-21 MED ORDER — ROSUVASTATIN CALCIUM 20 MG PO TABS: 20.0000 mg | ORAL_TABLET | Freq: Every day | ORAL | Status: DC

## 2013-01-21 NOTE — Patient Instructions (Addendum)
For your hand pain: - Try parrafin wax heating that you can dip your hand into daily. You can get this at a drug store. - I have referred you to [facility] so that you can get hand splints made specially to your hand. Call in 1-2 weeks if you have not heard from them. 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 - Take tyelenol scheduled throughout the day and for breakthrough pain use ibuprofen or aleve 2 tablets. - Only take tramadol if absolutely necessary. - Follow up in 1 month to let me know how this all is going.  For your cholesterol - Take crestor 20 mg daily.  For your arthritis, the best treatment is to keep moving so try to keep walking and parking far away. - If you have trouble tolerating this with breathing or chest pain or your legs are giving out, we need to rediscuss.  Arthritis, Nonspecific Arthritis is pain, redness, warmth, or puffiness (inflammation) of a joint. The joint may be stiff or hurt when you move it. One or more joints may be affected. There are many types of arthritis. Your doctor may not know what type you have right away. The most common cause of arthritis is wear and tear on the joint (osteoarthritis). HOME CARE   Only take medicine as told by your doctor.  Rest the joint as much as possible.  Raise (elevate) your joint if it is puffy.  Use crutches if the painful joint is in your leg.  Drink enough fluids to keep your pee (urine) clear or pale yellow.  Follow your doctor's diet instructions.  Use cold packs for very bad joint pain for 10 to 15 minutes every hour. Ask your doctor if it is okay for you to use hot packs.  Exercise as told by your doctor.  Take a warm shower if you have stiffness in the morning.  Move your sore joints throughout the day. GET HELP RIGHT AWAY IF:   You have a fever.  You have very bad joint pain, puffiness, or redness.  You have many joints that are painful and puffy.  You are not getting better with  treatment.  You have very bad back pain or leg weakness.  You cannot control when you poop (bowel movement) or pee (urinate).  You do not feel better in 24 hours or are getting worse.  You are having side effects from your medicine. MAKE SURE YOU:   Understand these instructions.  Will watch your condition.  Will get help right away if you are not doing well or get worse. Document Released: 04/17/2009 Document Revised: 07/23/2011 Document Reviewed: 04/17/2009 [facility] Patient Information 2014 [place], [place].

## 2013-01-21 NOTE — Progress Notes (Signed)
Patient ID: [name], female   DOB: 28-Mar-1960, 52 y.o.   MRN: [id number] Subjective:   CC: Follow-up  HPI:   1. Hand pain - Pt reports pain is unchanged. Quality is throbbing, 7/10. Pain is worse with cold exposure, for 1-2 hours in the morning, and when trying to do dishes, limiting her ability to do this. Pain wakes her at night.  2. BP - Pt reports taking norvasc, HCTZ, and carvedilol daily as prescribed. She reports blurred vision and some "dizziness" but no fainting when she takes medications. She keeps blood pressure log and range has been 120-131/73-80.  3. Ears - Pt reports that using debrox helped and she feels better now.  4.Hyperlipidemia - Pt reports muscle pains with Lipitor so she stopped using it. She is amenable to trying  crestor.   Review of Systems - Per HPI.   SH: Never Smoker     Objective:  Physical Exam BP 128/83  Pulse 97  Temp(Src) 98.5 F (36.9 C) (Oral)  Wt 200 lb (90.719 kg) GEN: NAD, pleasant HEENT: Atraumatic, normocephalic, neck supple, EOMI, sclera clear  CV: RRR, no murmurs, rubs, or gallops PULM: normal effort ABD: Soft, nontender, nondistended, NABS, no organomegaly SKIN: No rash or cyanosis; warm and well-perfused EXTR: No lower extremity edema or calf tenderness; bilateral wrists, thumbs, and pointer fingers (R>L) tender, no erythema or inflammation, full ROM, normal grip strength PSYCH: Mood and affect euthymic, normal rate and volume of speech NEURO: Awake, alert, no focal deficits grossly, normal speech   Left complete DG hand: FINDINGS:  Mild triscaphe joint osteoarthritis. Remainder of the carpal bones  are grossly unremarkable. No evidence for acute fracture or  dislocation. Regional soft tissues unremarkable.  IMPRESSION:  Mild osteoarthritis of the wrist.   Right complete DG hand: FINDINGS:  Mild STT joint osteoarthritis. Radiocarpal joint space appears  normal. MCP joint space is preserved. IP joints and soft tissues   appear within normal limits.  IMPRESSION:  Mild osteoarthritis of the wrist. Normal appearance of the hand.   Assessment:     [name] is a 52 y.o. female here for follow-up.    Plan:     # See problem list and after visit summary for problem-specific plans.  # Health Maintenance: Not discussed.  Follow-up: Follow up in 1 month for f/u of hand pain.   Leona Singleton, MD [facility]

## 2013-01-24 NOTE — Assessment & Plan Note (Signed)
Resolved. - Use debrox at first sign of next impaction.

## 2013-01-24 NOTE — Assessment & Plan Note (Signed)
Elevated last visit, well-controlled today. Pt reports blurred vision right when takes medication; takes all meds first thing in morning. Reports normal BPs at home, no lows <120s systolic or highs >150s systolic per pt. - Recommended continuing medication, but taking later in morning. - Check BP when feeling dizzy.  - Call me if continues having blurred vision/dizziness

## 2013-01-24 NOTE — Assessment & Plan Note (Signed)
No better, xrays show bilateral wrist osteoarthritis. ANA, CRP, and RF negative. No signs of infection. - Try parrafin wax heating - Referral placed for [facility] for hand splints. - Scheduled tylenol, ibuprofen or 2 aleve BID for breakthrough pain, tramadol for extreme pain - F/u in 1 month - Consider ESR, CRP, joint injection.

## 2013-01-24 NOTE — Assessment & Plan Note (Addendum)
Not adequately controlled, pt did not tolerate lipitor change from pravastatin with muscle aches. - Try crestor 20mg  daily.

## 2013-02-08 DIAGNOSIS — M19049 Primary osteoarthritis, unspecified hand: Secondary | ICD-10-CM | POA: Diagnosis not present

## 2013-02-11 ENCOUNTER — Other Ambulatory Visit: Payer: Self-pay | Admitting: Family Medicine

## 2013-02-23 ENCOUNTER — Telehealth: Payer: Self-pay | Admitting: Family Medicine

## 2013-02-23 NOTE — Telephone Encounter (Signed)
LMOVM for pt to return call .Nate [facility]  

## 2013-02-23 NOTE — Telephone Encounter (Signed)
Please let pt know that she will have to come in for this, because our clinic has stopped doing refills of this medicine along with other narcotics without an appointment. She is due for follow-up of her hand pain any ways, so it will be good for [place] to touch base about this.  Thanks!  Hilton Sinclair, MD

## 2013-02-23 NOTE — Telephone Encounter (Signed)
Needs tramadol refilled [facility] on [address]

## 2013-02-26 ENCOUNTER — Telehealth: Payer: Self-pay | Admitting: Oncology

## 2013-02-26 ENCOUNTER — Other Ambulatory Visit (HOSPITAL_BASED_OUTPATIENT_CLINIC_OR_DEPARTMENT_OTHER): Payer: BC Managed Care – PPO

## 2013-02-26 ENCOUNTER — Encounter: Payer: Self-pay | Admitting: Oncology

## 2013-02-26 ENCOUNTER — Ambulatory Visit (HOSPITAL_BASED_OUTPATIENT_CLINIC_OR_DEPARTMENT_OTHER): Payer: BC Managed Care – PPO | Admitting: Oncology

## 2013-02-26 VITALS — BP 148/79 | HR 97 | Temp 98.5°F | Resp 20 | Ht 63.0 in | Wt 201.1 lb

## 2013-02-26 DIAGNOSIS — D693 Immune thrombocytopenic purpura: Secondary | ICD-10-CM

## 2013-02-26 DIAGNOSIS — C50919 Malignant neoplasm of unspecified site of unspecified female breast: Secondary | ICD-10-CM

## 2013-02-26 LAB — COMPREHENSIVE METABOLIC PANEL (CC13)
ALK PHOS: 44 U/L (ref 40–150)
ALT: 29 U/L (ref 0–55)
AST: 26 U/L (ref 5–34)
Albumin: 3.5 g/dL (ref 3.5–5.0)
Anion Gap: 12 mEq/L — ABNORMAL HIGH (ref 3–11)
BUN: 12.5 mg/dL (ref 7.0–26.0)
CALCIUM: 9.5 mg/dL (ref 8.4–10.4)
CHLORIDE: 103 meq/L (ref 98–109)
CO2: 30 mEq/L — ABNORMAL HIGH (ref 22–29)
Creatinine: 0.8 mg/dL (ref 0.6–1.1)
Glucose: 129 mg/dl (ref 70–140)
Potassium: 3.3 mEq/L — ABNORMAL LOW (ref 3.5–5.1)
Sodium: 144 mEq/L (ref 136–145)
Total Bilirubin: 0.44 mg/dL (ref 0.20–1.20)
Total Protein: 7.3 g/dL (ref 6.4–8.3)

## 2013-02-26 LAB — CBC WITH DIFFERENTIAL/PLATELET
BASO%: 0.6 % (ref 0.0–2.0)
Basophils Absolute: 0 10*3/uL (ref 0.0–0.1)
EOS%: 2 % (ref 0.0–7.0)
Eosinophils Absolute: 0.1 10*3/uL (ref 0.0–0.5)
HCT: 40.1 % (ref 34.8–46.6)
HGB: 13 g/dL (ref 11.6–15.9)
LYMPH%: 36.9 % (ref 14.0–49.7)
MCH: 25.1 pg (ref 25.1–34.0)
MCHC: 32.4 g/dL (ref 31.5–36.0)
MCV: 77.4 fL — ABNORMAL LOW (ref 79.5–101.0)
MONO#: 0.4 10*3/uL (ref 0.1–0.9)
MONO%: 5.8 % (ref 0.0–14.0)
NEUT#: 3.6 10*3/uL (ref 1.5–6.5)
NEUT%: 54.7 % (ref 38.4–76.8)
NRBC: 0 % (ref 0–0)
Platelets: 99 10*3/uL — ABNORMAL LOW (ref 145–400)
RBC: 5.18 10*6/uL (ref 3.70–5.45)
RDW: 14.5 % (ref 11.2–14.5)
WBC: 6.5 10*3/uL (ref 3.9–10.3)
lymph#: 2.4 10*3/uL (ref 0.9–3.3)

## 2013-02-26 MED ORDER — POTASSIUM CHLORIDE CRYS ER 20 MEQ PO TBCR
20.0000 meq | EXTENDED_RELEASE_TABLET | Freq: Once | ORAL | Status: DC
Start: 2013-02-26 — End: 2013-06-25

## 2013-02-26 NOTE — Patient Instructions (Signed)
Breast Cancer Survivor Follow-Up Breast cancer begins when cells in the breast divide too rapidly. The extra cells form a lump (tumor). When the cancer is treated, the goal is to get rid of all cancer cells. However, sometimes a few cells survive. These cancer cells can then grow. They become recurrent cancer. This means the cancer comes back after treatment.  Most cases of recurrent breast cancer develop 3 to 5 years after treatment. However, sometimes it comes back just a few months after treatment. Other times, it does not come back until years later. If the cancer comes back in the same area as the first breast cancer, it is called a local recurrence. If the cancer comes back somewhere else in the body, it is called regional recurrence if the site is fairly near the breast or distant recurrence if it is far from the breast. Your caregiver may also use the term metastasize to indicate a cancer that has gone to another part of your body. Treatment is still possible after either kind of recurrence. The cancer can still be controlled.  CAUSES OF RECURRENT CANCER No one knows exactly why breast cancer starts in the first place. Why the cancer comes back after treatment is also not clear. It is known that certain conditions, called risk factors, can make this more likely. They include:  Developing breast cancer for the first time before age 60.  Having breast cancer that involves the lymph nodes. These are small, round pieces of tissue found all over the body. Their job is to help fight infections.  Having a large tumor. Cancer is more apt to come back if the first tumor was bigger than 2 inches (5 cm).  Having certain types of breast cancer, such as:  Inflammatory breast cancer. This rare type grows rapidly and causes the breast to become red and swollen.  A high-grade tumor. The grade of a tumor indicates how fast it will grow and spread. High-grade tumors grow more quickly than other types.  HER2  cancer. This refers to the tumor's genetic makeup. Tumors that have this type of gene are more likely to come back after treatment.  Having close tumor margins. This refers to the space between the tumor and normal, noncancerous cells. If the space is small, the tumor has a greater chance of coming back.  Having treatment involving a surgery to remove the tumor but not the entire breast (lumpectomy) and no radiation therapy. CARE AFTER BREAST CANCER Home Monitoring Women who have had breast cancer should continue to examine their breasts every month. The goal is to catch the cancer quickly if it comes back. Many women find it helpful to do so on the same day each month and to mark the calendar as a reminder. Let your caregiver know immediately if you have any signs of recurrent breast cancer. Symptoms will vary, depending on where the cancer recurs. The original type of treatment can also make a difference. Symptoms of local recurrence after a lumpectomy or a recurrence in the opposite breast may include:  A new lump or thickening in the breast.  A change in the way the skin looks on the breast (such as a rash, dimpling, or wrinkling).  Redness or swelling of the breast.  Changes in the nipple (such as being red, puckered, swollen, or leaking fluid). Symptoms of a recurrence after a breast removal surgery (mastectomy) may include:  A lump or thickening under the skin.  A thickening around the mastectomy scar. Symptoms   of regional recurrence in the lymph nodes near the breast may include:  A lump under the arm or above the collarbone.  Swelling of the arm.  Pain in the arm, shoulder, or chest.  Numbness in the hand or arm. Symptoms of distant recurrence may include:  A cough that does not go away.  Trouble breathing or shortness of breath.  Pain in the bones or the chest. This is pain that lasts or does not respond to rest and medicine.  Headaches.  Sudden vision  problems.  Dizziness.  Nausea or vomiting.  Losing weight without trying to.  Persistent abdominal pain.  Changes in bowel movements or blood in the stool.  Yellowing of the skin or eyes (jaundice).  Blood in the urine or bloody vaginal discharge. Clinical Monitoring  It is helpful to keep a schedule of appointments for needed tests and exams. This includes physical exams, breast exams, exams of the lymph nodes, and general exams.  For the first 3 years after being treated for breast cancer, see your caregiver every 3 to 6 months.  For years 4 and 5 after breast cancer, see your caregiver every 6 to 12 months.  After 5 years, see your caregiver at least once a year.  Regular breast X-rays (mammograms) should continue even if you had a mastectomy.  A mammogram should be done 1 year after the mammogram that first detected breast cancer.  A mammogram should be done every 6 to 12 months after that. Follow your caregiver's advice.  A pelvic exam done by your caregiver checks whether female organs are the normal size and shape. The exam is usually done every year. Ask your caregiver if that schedule is right for you.  Women taking tamoxifen should report any vaginal bleeding immediately to their caregiver. Tamoxifen is often given to women with a certain type of breast cancer. It has been shown to help prevent recurrence.  You will need to decide who your primary caregiver will be.  Most people continue to see their cancer specialist (oncologist) every 3 to 6 months for the first year after cancer treatment.  At some point, you may want to go back to seeing your family caregiver. You would no longer see your oncologist for regular checkups. Many women do this about 1 year after their first diagnosis of breast cancer.  You will still need to be seen every so often by your oncologist. Ask how often that should be. Coordinate this with your family or primary caregiver.  Think about  having genetic counseling. This would provide information on traits that can be passed or inherited from one generation to the next. In some cases, breast cancer runs in families. Tell your caregiver if you:  Are of Ashkenazi Jewish heritage.  Have any family member who has had ovarian cancer.  Have a mother, sister, or daughter who had breast cancer before age 7.  Have 2 or more close relatives who have had breast cancer. This means a mother, sister, daughter, aunt, or grandmother.  Had breast cancer in both breasts.  Have a female relative who has had breast cancer.  Some tests are not recommended for routine screening. Someone recovering from breast cancer does not need to have these tests if there are no problems. The tests have risks, such as radiation exposure, and can be costly. The risks of these tests are thought to be greater than the benefits:  Blood tests.  Chest X-rays.  Bone scans.  Liver ultrasound.  Computed tomography (CT scan).  Positron emission tomography (PET scan).  Magnetic resonance imaging (MRI scan). DIAGNOSIS OF RECURRENT CANCER Recurrent breast cancer may be suspected for various reasons. A mammogram may not look normal. You might feel a lump or have other symptoms. Your caregiver may find something unusual during an exam. To be sure, your caregiver will probably order some tests. The tests are needed because there are symptoms or hints of a problem. They could include:  Blood tests, including a test to check how well the liver is working. The liver is a common site for a distant cancer recurrence.  Imaging tests that create pictures of the inside of the body. These tests include:  Chest X-rays to show if the cancer has come back in the lungs.  CT scans to create detailed pictures of various areas of the body and help find a distant recurrence.  MRI scans to find anything unusual in the breast, chest, or lymph nodes.  Breast ultrasound tests to  examine the breasts.  Bone scans to create a picture of your whole skeleton and find cancer in bony areas.  PET scans to create an image of the whole body. PET scans can be used together with CT scans to show more detail.  Biopsy. A small sample of tissue is taken and checked under a microscope. If cancer cells are found, they may be tested to see if they contain the HER2 gene or the hormones estrogen and progesterone. This will help your caregiver decide how to treat the recurrent cancer. TREATMENT  How recurrent breast cancer is treated depends on where the new cancer is found. The type of treatment that was used for the first breast cancer makes a difference, too. A combination of treatments may be used. Options include:  Surgery.  If the cancer comes back in the breast that was not treated before, you may need a lumpectomy or mastectomy.  If the cancer comes back in the breast that was treated before, you may need a mastectomy.  The lymph nodes under the arm may need to be removed.  Radiation therapy.  For a local recurrence, radiation may be used if it was not used during the first treatment.  For a distance recurrence, radiation is sometimes used.  Chemotherapy.  This may be used before surgery to treat recurrent breast cancer.  This may be used to treat recurrent cancer that cannot be treated with surgery.  This may be used to treat a distant recurrence.  Hormone therapy.  Women with the HER2 gene may be given hormone therapy to attack this gene. Document Released: 09/19/2010 Document Revised: 04/15/2011 Document Reviewed: 09/19/2010 [employer] Patient Information 2014 [employer].  

## 2013-02-26 NOTE — Telephone Encounter (Signed)
, °

## 2013-02-26 NOTE — Progress Notes (Signed)
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 With subsequent subcutaneous recurrence   STAGE: CARCINOMA, INFILTRATING DUCTAL, RIGHT BREAST   Primary site: Breast (Right)   Staging method: AJCC 7th Edition   Clinical: Stage IIA (T2, N0, cM0) signed by Deatra Robinson, MD on 03/07/2013  7:21 PM   Summary: Stage IIA (T2, N0, cM0)  PRIOR THERAPY:   #1 patient initially underwent a right mastectomy in June 2011 with the final pathology revealing 2 foci of invasive ductal carcinoma, comment 1 measuring 2.2 cm and a second one measuring 0.5 cm. The tumor was ER positive PR positive HER-2/neu negative with a proliferation marker 11%.   #2 the patient had Oncotype DX testing done that revealed recurrence score of 27 with 18% her risk of distant recurrence with tamoxifen alone.   #3 patient had adjuvant chemotherapy consisting of Taxotere and Cytoxan every 3 weeks for a total of 4 cycles from 10/23/2009 2 12/25/2009.   #4 patient had reconstruction performed by Dr. Crissie Reese.   #5 she was then begun on tamoxifen 20 mg daily in January 2012.   #6 patient then developed a subcutaneous nodule at the site of her original mastectomy scar. This was resected. She then went on to have radiation therapy locally to this to the mastectomy site.   #7 patient continues on tamoxifen 20 mg daily.   #8 Effexor XR 37.5 mg daily for hot flashes   CURRENT THERAPY: Tamoxifen 20 mg daily  ECOG PERFORMANCE STATUS: 1 - Symptomatic but completely ambulatory  INTERVAL HISTORY:[name] 53 y.o. female returns for a follow up appointment.  SHe is doing well except for intermittent hot flashes improved with Effexor. She has some intermittent joint pain,Denies any nausea, vomiting, fatigue, vaginal discharge, new masses during self exam,  respiratory or cardiac complaints.     REVIEW OF SYSTEMS: Remainder of the 10 point ROS is negative.  PAST MEDICAL HISTORY: Past Medical History  Diagnosis Date  . Hypertension   . Hyperlipidemia   . Thrombocytopenia   . Diabetes mellitus   . Arthritis   . Breast CA     breast CA- right breast  . Thyroid disease     parathyroid removed  . Heel spur     bilateral    PAST SURGICAL HISTORY: Past Surgical History  Procedure Laterality Date  . Breast surgery  09/12/10    excision of skin mass - right breast, had another surgery to remove more Cancer cells  . Parathyroidectomy      partial?  . Abdominal hysterectomy      1996 per pt    FAMILY HISTORY Family History  Problem Relation Age of Onset  . Heart disease Mother   . Stroke Father   . Heart disease Brother   . Colon cancer Neg Hx   . Esophageal cancer Neg Hx   . Rectal cancer Neg Hx   . Stomach cancer Neg Hx     GHEALTH MAINTENANCE: History  Substance Use Topics  . Smoking status: Never Smoker   . Smokeless tobacco: Never Used  . Alcohol Use: No      No Known Allergies  Current Outpatient Prescriptions  Medication Sig Dispense Refill  . Acetaminophen 650 MG TABS Take 1 tablet (650 mg total) by mouth 3 (three) times daily as needed.  30 tablet  0  .  amLODipine (NORVASC) 5 MG tablet TAKE ONE TABLET BY MOUTH EVERY DAY  30 tablet  11  . carvedilol (COREG) 25 MG tablet Take 1 tablet (25 mg total) by mouth 2 (two) times daily with a meal.  60 tablet  11  . hydrochlorothiazide (HYDRODIURIL) 25 MG tablet TAKE ONE TABLET BY MOUTH ONCE DAILY  30 tablet  11  . metFORMIN (GLUCOPHAGE XR) 500 MG 24 hr tablet Take 1 tablet (500 mg total) by mouth daily with breakfast.  30 tablet  11  . potassium chloride SA (K-DUR,KLOR-CON) 20 MEQ tablet Take 1 tablet (20 mEq total) by mouth once.  5 tablet  0  . rosuvastatin (CRESTOR) 20 MG tablet Take 1 tablet (20 mg total) by mouth daily.  90 tablet  3  . tamoxifen (NOLVADEX) 20 MG  tablet Take 1 tablet (20 mg total) by mouth daily.  30 tablet  6  . traMADol (ULTRAM) 50 MG tablet Take 1 tablet (50 mg total) by mouth daily as needed. Need MD appt before refills.  30 tablet  0  . venlafaxine XR (EFFEXOR-XR) 37.5 MG 24 hr capsule Take 1 capsule (37.5 mg total) by mouth daily.  30 capsule  5   No current facility-administered medications for this visit.    OBJECTIVE: Filed Vitals:   02/26/13 0824  BP: 148/79  Pulse: 97  Temp: 98.5 F (36.9 C)  Resp: 20     Body mass index is 35.63 kg/(m^2).       GENERAL: Well developed, well nourished, in no acute distress.  EENT: No ocular or oral lesions. No stomatitis.  RESPIRATORY: Lungs are clear to auscultation bilaterally, no wheezing, rhonchi or rales. CARDIAC: regular rate and rhythm, no murmurs, rubs or gallops. GI: Abdomen is soft, non tender,  no palpable hepatosplenomegaly. No fluid wave. Musculoskeletal:No focal spinal tenderness, no peripheral edema.[facility]. Lymph: No cervical, infraclavicular, axillary or inguinal adenopathy. Neuro: No focal neurological deficits. Psych: Alert and oriented X 3, appropriate mood and affect.  BREAST EXAM: Left breast no masses or nipple discharge no skin changes. Right mastectomy scar looks well healed there is changes from radiation otherwise no masses no nodularity.  LAB RESULTS: Lab Results  Component Value Date   WBC 6.5 02/26/2013   NEUTROABS 3.6 02/26/2013   HGB 13.0 02/26/2013   HCT 40.1 02/26/2013   MCV 77.4* 02/26/2013   PLT 99* 02/26/2013      Chemistry      Component Value Date/Time   NA 144 02/26/2013 0801   NA 140 11/13/2012 0957   K 3.3* 02/26/2013 0801   K 3.3* 11/13/2012 0957   CL 100 11/13/2012 0957   CL 105 02/21/2012 0806   CO2 30* 02/26/2013 0801   CO2 34* 11/13/2012 0957   BUN 12.5 02/26/2013 0801   BUN 8 11/13/2012 0957   CREATININE 0.8 02/26/2013 0801   CREATININE 0.75 11/13/2012 0957   CREATININE 0.82  07/22/2011 0801      Component Value Date/Time   CALCIUM 9.5 02/26/2013 0801   CALCIUM 9.4 11/13/2012 0957   ALKPHOS 44 02/26/2013 0801   ALKPHOS 48 11/13/2012 0957   AST 26 02/26/2013 0801   AST 31 11/13/2012 0957   ALT 29 02/26/2013 0801   ALT 30 11/13/2012 0957   BILITOT 0.44 02/26/2013 0801   BILITOT 0.4 11/13/2012 0957       Lab Results  Component Value Date   LABCA2 28 09/18/2010    Urinalysis  Component Value Date/Time   COLORURINE YELLOW 07/17/2009 1512   APPEARANCEUR CLEAR 07/17/2009 1512   LABSPEC 1.018 07/17/2009 1512   PHURINE 5.5 07/17/2009 1512   GLUCOSEU NEGATIVE 07/17/2009 1512   HGBUR TRACE* 07/17/2009 1512   BILIRUBINUR NEG 05/17/2011 1537   BILIRUBINUR NEGATIVE 07/17/2009 1512   KETONESUR NEGATIVE 07/17/2009 1512   PROTEINUR NEGATIVE 07/17/2009 1512   UROBILINOGEN 2.0 05/17/2011 1537   UROBILINOGEN 0.2 07/17/2009 1512   NITRITE NEG 05/17/2011 1537   NITRITE NEGATIVE 07/17/2009 1512   LEUKOCYTESUR Negative 05/17/2011 1537    STUDIES: No results found.  Filed Weights   02/26/13 0824  Weight: 201 lb 1.6 oz (91.218 kg)    ASSESSMENT/ PLAN: 80 year oldd female with   #1 right breast cancer status post mastectomy with subsequent local recurrence which was resected followed by radiation therapy.She is continuing tamoxifen 20 mg daily. She is to return in a follow up visit in 6 months with labs.  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 No bleeding   #3 hot flashes patient is on Effexor XR 37.5 mg on a daily basis, improved symptoms.  #4 The patient is to undergo a routine colonoscopy in the near future.  All questions were answered. The patient knows to call the clinic with any problems, questions or concerns. We can certainly see the patient much sooner if necessary.   Time spent 20 minutes counseling the patient face to face. The total time spent in the appointment was 30 minutes.    Marcy Panning, MD Medical/Oncology [facility] [phone] (beeper) [phone] (Office)  02/26/2013, 9:31 AM

## 2013-03-04 ENCOUNTER — Encounter: Payer: Self-pay | Admitting: Family Medicine

## 2013-03-04 ENCOUNTER — Ambulatory Visit (INDEPENDENT_AMBULATORY_CARE_PROVIDER_SITE_OTHER): Payer: BC Managed Care – PPO | Admitting: Family Medicine

## 2013-03-04 VITALS — BP 138/85 | HR 97 | Temp 98.5°F | Wt 199.8 lb

## 2013-03-04 DIAGNOSIS — M1990 Unspecified osteoarthritis, unspecified site: Secondary | ICD-10-CM

## 2013-03-04 DIAGNOSIS — M25549 Pain in joints of unspecified hand: Secondary | ICD-10-CM

## 2013-03-04 MED ORDER — TRAMADOL HCL 50 MG PO TABS: 50.0000 mg | ORAL_TABLET | Freq: Two times a day (BID) | ORAL | Status: DC | PRN

## 2013-03-04 NOTE — Progress Notes (Signed)
Patient ID: [name], female   DOB: 1960-12-06, 53 y.o.   MRN: [id number] Subjective:   CC: Follow-up hand pain  HPI:   Hand pain - Patient reports bilateral hands are still achy. She saw the hand surgeon who prescribed braces for day and night and warm parraffin treatment. She has purchased these and thinks the hand braces are helping a little but that she likely needs to give them more time. She has not tried the warm wax yet but has done heating pads which have helped a little too. She does not report red swollen joints or fevers/chills. She ran out of tramadol so has been using tylenol every 4 hours. This helps briefly but does not last. When she takes tramadol, it helps longer-term. She uses it twice daily though she tries only to use when absolutely needed. Thirty tabs daily had not been lasting. She will see the hand doctor again next month and if the hand braces did not help much, they will discuss hand surgery - she is not sure yet how she feels about it. Pain is worst at night and when hands are cold.  Review of Systems - Per HPI.   PMH: Meds reviewed - potassium: Has 2 more tablets, rx'ed by oncology due to low K - Increased tylenol to 1 q4 hours due to running out of tramadol  Objective:  Physical Exam BP 138/85  Pulse 97  Temp(Src) 98.5 F (36.9 C) (Oral)  Wt 199 lb 12.8 oz (90.629 kg) GEN: NAD HEENT: Atraumatic, normocephalic, neck supple, EOMI, sclera clear  PULM: normal effort SKIN: No rash or cyanosis; warm and well-perfused EXTR: Bilateral hands tender at thenar eminence and posterior hand at thumb; bilateral 2nd-3rd fingers posterior DIP also tender with small prominence at 2nd digit DIP of both hands; no warmth or erythema or swelling; full ROM and good strength PSYCH: Mood and affect euthymic, normal rate and volume of speech NEURO: Awake, alert, no focal deficits grossly, normal speech    Assessment:     [name] is a 53 y.o. female with h/o hand  osteoarthritis here for follow-up and med refill.    Plan:     # See problem list and after visit summary for problem-specific plans.   # Health Maintenance: Not discussed  Follow-up: Follow up in ~1 month after visit with hand doctor for follow-up of hand pain.   Hilton Sinclair, MD [place]

## 2013-03-04 NOTE — Patient Instructions (Signed)
It was very good to see you.  I am glad you were able to see the hand doctor. I will refill tramadol with 2 prescriptions of 30 tablets. This should last you 1-2 months. Once you have your Feb appt with the hand doctor, call and make an appt to see me afterwards so we can discuss the plan. We will not go up on tramadol but will not take it away until we have a good plan. Also ask them to send me information from your visit.  Be well.  Hilton Sinclair, MD  Osteoarthritis Osteoarthritis is a disease that causes soreness and swelling (inflammation) of a joint. It occurs when the cartilage at the affected joint wears down. Cartilage acts as a cushion, covering the ends of bones where they meet to form a joint. Osteoarthritis is the most common form of arthritis. It often occurs in older people. The joints affected most often by this condition include those in the:  Ends of the fingers.  Thumbs.  Neck.  Lower back.  Knees.  Hips. CAUSES  Over time, the cartilage that covers the ends of bones begins to wear away. This causes bone to rub on bone, producing pain and stiffness in the affected joints.  RISK FACTORS Certain factors can increase your chances of having osteoarthritis, including:  Older age.  Excessive body weight.  Overuse of joints. SIGNS AND SYMPTOMS   Pain, swelling, and stiffness in the joint.  Over time, the joint may lose its normal shape.  Small deposits of bone (osteophytes) may grow on the edges of the joint.  Bits of bone or cartilage can break off and float inside the joint space. This may cause more pain and damage. DIAGNOSIS  Your health care provider will do a physical exam and ask about your symptoms. Various tests may be ordered, such as:  X-rays of the affected joint.  An MRI scan.  Blood tests to rule out other types of arthritis.  Joint fluid tests. This involves using a needle to draw fluid from the joint and examining the fluid under  a microscope. TREATMENT  Goals of treatment are to control pain and improve joint function. Treatment plans may include:  A prescribed exercise program that allows for rest and joint relief.  A weight control plan.  Pain relief techniques, such as:  Properly applied heat and cold.  Electric pulses delivered to nerve endings under the skin (transcutaneous electrical nerve stimulation, TENS).  Massage.  Certain nutritional supplements.  Medicines to control pain, such as:  Acetaminophen.  Nonsteroidal anti-inflammatory drugs (NSAIDs), such as naproxen.  Narcotic or central-acting agents, such as tramadol.  Corticosteroids. These can be given orally or as an injection.  Surgery to reposition the bones and relieve pain (osteotomy) or to remove loose pieces of bone and cartilage. Joint replacement may be needed in advanced states of osteoarthritis. HOME CARE INSTRUCTIONS   Only take over-the-counter or prescription medicines as directed by your health care provider. Take all medicines exactly as instructed.  Maintain a healthy weight. Follow your health care provider's instructions for weight control. This may include dietary instructions.  Exercise as directed. Your health care provider can recommend specific types of exercise. These may include:  Strengthening exercises These are done to strengthen the muscles that support joints affected by arthritis. They can be performed with weights or with exercise bands to add resistance.  Aerobic activities These are exercises, such as brisk walking or low-impact aerobics, that get your heart pumping.  Range-of-motion activities These keep your joints limber.  Balance and agility exercises These help you maintain daily living skills.  Rest your affected joints as directed by your health care provider.  Follow up with your health care provider as directed. SEEK MEDICAL CARE IF:   Your skin turns red.  You develop a rash in  addition to your joint pain.  You have worsening joint pain. SEEK IMMEDIATE MEDICAL CARE IF:  You have a significant loss of weight or appetite.  You have a fever along with joint or muscle aches.  You have night sweats. [place] of Arthritis and Musculoskeletal and Skin Diseases: www.niams.SouthExposed.es [employer] on Aging: http://kim-miller.com/ American College of Rheumatology: www.rheumatology.org Document Released: 01/21/2005 Document Revised: 11/11/2012 Document Reviewed: 09/28/2012 [facility] Patient Information 2014 [place], [place].

## 2013-03-04 NOTE — Assessment & Plan Note (Signed)
Mildly improved with applying heat and wearing braces. No signs of infection  - Continue braces, parrafin wax treatment, heating pad. - F/u in ~1 month, after visit with hand surgeon. At that visit, will consider other options if still painful (surgery, ?injections) - Continue tramadol prn extreme pain (reports twice daily) - rx'ed 30 tabs x 2, 2nd to be filled after 2/14. - Goal to not increase, and possibly decrease if we identify other ways to control pain so pt can remain functional. - Schedule tylenol otherwise. - At f/u, sign pain contract if not already done.

## 2013-03-14 ENCOUNTER — Emergency Department (INDEPENDENT_AMBULATORY_CARE_PROVIDER_SITE_OTHER)
Admission: EM | Admit: 2013-03-14 | Discharge: 2013-03-14 | Disposition: A | Discharge status: Home / Self Care | Payer: BC Managed Care – PPO | Source: Home / Self Care

## 2013-03-14 ENCOUNTER — Encounter (HOSPITAL_COMMUNITY): Payer: Self-pay | Admitting: Emergency Medicine

## 2013-03-14 DIAGNOSIS — H6120 Impacted cerumen, unspecified ear: Secondary | ICD-10-CM | POA: Diagnosis not present

## 2013-03-14 DIAGNOSIS — H9209 Otalgia, unspecified ear: Secondary | ICD-10-CM

## 2013-03-14 DIAGNOSIS — H9202 Otalgia, left ear: Secondary | ICD-10-CM

## 2013-03-14 MED ORDER — ANTIPYRINE-BENZOCAINE 5.4-1.4 % OT SOLN: OTIC | Status: DC

## 2013-03-14 NOTE — Discharge Instructions (Signed)
Cerumen Impaction A cerumen impaction is when the wax in your ear forms a plug. This plug usually causes reduced hearing. Sometimes it also causes an earache or dizziness. Removing a cerumen impaction can be difficult and painful. The wax sticks to the ear canal. The canal is sensitive and bleeds easily. If you try to remove a heavy wax buildup with a cotton tipped swab, you may push it in further. Irrigation with water, suction, and small ear curettes may be used to clear out the wax. If the impaction is fixed to the skin in the ear canal, ear drops may be needed for a few days to loosen the wax. People who build up a lot of wax frequently can use ear wax removal products available in your local drugstore. SEEK MEDICAL CARE IF:  You develop an earache, increased hearing loss, or marked dizziness. Document Released: 02/29/2004 Document Revised: 04/15/2011 Document Reviewed: 04/20/2009 [facility] Patient Information 2014 [place], [place].  Ear Drops, Adult You have been diagnosed with a condition requiring you to put drops of medication into your outer ear. HOME CARE INSTRUCTIONS   Put drops in the affected ear as instructed. After putting the drops in, you will need to lay down with the affected ear facing up for ten minutes so the drops will remain in the ear canal and run down and fill the canal. Continue using eardrops for as long as directed by your health care provider.  Prior to getting up, put a cotton ball gently in your ear canal. Leave enough of the ball out so it can be easily removed. Do not attempt to push this down into the canal with a cotton-tipped swab or other instrument.  Do not irrigate or wash out your ears if you have had a perforated eardrum or mastoid surgery, or unless instructed to do so by your health care provider.  Keep appointments with your health care provider as instructed.  Finish all medications, or use for the length of time as instructed. Continue the drops even  if your problem seems to be doing well after a couple days, or continue as instructed. SEEK MEDICAL CARE IF:  You become worse or develop increasing pain.  You notice any unusual drainage from your ear (particularly if the drainage stinks).  You develop hearing difficulties.  You experience a serious form of dizziness in which you feel as if the room is spinning, and you feel nauseated (vertigo).  The outside of your ear becomes red or swollen or both. This may be a sign of an allergic reaction. MAKE SURE YOU:   Understand these instructions.  Will watch your condition.  Will get help right away if you are not doing well or get worse. Document Released: 01/15/2001 Document Revised: 11/11/2012 Document Reviewed: 08/18/2012 [facility] Patient Information 2014 [place], [place].  Otalgia Otalgia is pain in or around the ear. When the pain is from the ear itself it is called primary otalgia. Pain may also be coming from somewhere else, like the head and neck. This is called secondary otalgia.  CAUSES  Causes of primary otalgia include:  Middle ear infection.  It can also be caused by injury to the ear or infection of the ear canal (swimmer's ear). Swimmer's ear causes pain, swelling and often drainage from the ear canal. Causes of secondary otalgia include:  Sinus infections.  Allergies and colds that cause stuffiness of the nose and tubes that drain the ears (eustachian tubes).  Dental problems like cavities, gum infections or  teething.  Sore Throat (tonsillitis and pharyngitis).  Swollen glands in the neck.  Infection of the bone behind the ear (mastoiditis).  TMJ discomfort (problems with the joint between your jaw and your skull).  Other problems such as nerve disorders, circulation problems, heart disease and tumors of the head and neck can also cause symptoms of ear pain. This is rare. DIAGNOSIS  Evaluation, Diagnosis and Testing:  Examination by your medical caregiver  is recommended to evaluate and diagnose the cause of otalgia.  Further testing or referral to a specialist may be indicated if the cause of the ear pain is not found and the symptom persists. TREATMENT   Your doctor may prescribe antibiotics if an ear infection is diagnosed.  Pain relievers and topical analgesics may be recommended.  It is important to take all medications as prescribed. HOME CARE INSTRUCTIONS   It may be helpful to sleep with the painful ear in the up position.  A warm compress over the painful ear may provide relief.  A soft diet and avoiding gum may help while ear pain is present. SEEK IMMEDIATE MEDICAL CARE IF:  You develop severe pain, a high fever, repeated vomiting or dehydration.  You develop extreme dizziness, headache, confusion, ringing in the ears (tinnitus) or hearing loss. Document Released: 02/29/2004 Document Revised: 04/15/2011 Document Reviewed: 11/30/2008 [facility] Patient Information 2014 [place].

## 2013-03-14 NOTE — ED Provider Notes (Addendum)
CSN: [id number]     Arrival date & time 03/14/13  1326 History   First MD Initiated Contact with Patient 03/14/13 1504     Chief Complaint  Patient presents with  . Otalgia  . Sore Throat   (Consider location/radiation/quality/duration/timing/severity/associated sxs/prior Treatment) HPI Comments: 53 year old female complaining of left ear discomfort and sore throat for 2 weeks. Sometimes it is worse at night. She has an occasional runny nose. She states her ears feels soft the. Denies fever or congestion. Has been taking Tylenol without significant relief.   Past Medical History  Diagnosis Date  . Hypertension   . Hyperlipidemia   . Thrombocytopenia   . Diabetes mellitus   . Arthritis   . Breast CA     breast CA- right breast  . Thyroid disease     parathyroid removed  . Heel spur     bilateral   Past Surgical History  Procedure Laterality Date  . Breast surgery  09/12/10    excision of skin mass - right breast, had another surgery to remove more Cancer cells  . Parathyroidectomy      partial?  . Abdominal hysterectomy      1996 per pt   Family History  Problem Relation Age of Onset  . Heart disease Mother   . Stroke Father   . Heart disease Brother   . Colon cancer Neg Hx   . Esophageal cancer Neg Hx   . Rectal cancer Neg Hx   . Stomach cancer Neg Hx    History  Substance Use Topics  . Smoking status: Never Smoker   . Smokeless tobacco: Never Used  . Alcohol Use: No   OB History   Grav Para Term Preterm Abortions TAB SAB Ect Mult Living                 Review of Systems  Constitutional: Negative.   HENT: Positive for ear pain, hearing loss and sore throat. Negative for ear discharge, postnasal drip and sinus pressure.   Respiratory: Negative.   Cardiovascular: Negative.   Gastrointestinal: Negative.   Neurological: Negative.     Allergies  Review of patient's allergies indicates no known allergies.  Home Medications   Current Outpatient Rx  Name   Route  Sig  Dispense  Refill  . amLODipine (NORVASC) 5 MG tablet      TAKE ONE TABLET BY MOUTH EVERY DAY   30 tablet   11   . carvedilol (COREG) 25 MG tablet   Oral   Take 1 tablet (25 mg total) by mouth 2 (two) times daily with a meal.   60 tablet   11   . hydrochlorothiazide (HYDRODIURIL) 25 MG tablet      TAKE ONE TABLET BY MOUTH ONCE DAILY   30 tablet   11   . metFORMIN (GLUCOPHAGE XR) 500 MG 24 hr tablet   Oral   Take 1 tablet (500 mg total) by mouth daily with breakfast.   30 tablet   11   . potassium chloride SA (K-DUR,KLOR-CON) 20 MEQ tablet   Oral   Take 1 tablet (20 mEq total) by mouth once.   5 tablet   0   . rosuvastatin (CRESTOR) 20 MG tablet   Oral   Take 1 tablet (20 mg total) by mouth daily.   90 tablet   3   . traMADol (ULTRAM) 50 MG tablet   Oral   Take 1 tablet (50 mg total) by mouth 2 (two) times  daily as needed.   30 tablet   0   . Acetaminophen 650 MG TABS   Oral   Take 1 tablet (650 mg total) by mouth 3 (three) times daily as needed.   30 tablet   0   . antipyrine-benzocaine (AURALGAN) otic solution      Insert 3-4 gtts into R ear q 2-3 h prn pain   10 mL   0   . tamoxifen (NOLVADEX) 20 MG tablet   Oral   Take 1 tablet (20 mg total) by mouth daily.   30 tablet   6   . traMADol (ULTRAM) 50 MG tablet   Oral   Take 1 tablet (50 mg total) by mouth 2 (two) times daily as needed. Fill on/after 03/20/13.   30 tablet   0   . venlafaxine XR (EFFEXOR-XR) 37.5 MG 24 hr capsule   Oral   Take 1 capsule (37.5 mg total) by mouth daily.   30 capsule   5    BP 128/52  Pulse 95  Temp(Src) 98.1 F (36.7 C) (Oral)  Resp 18  SpO2 100% Physical Exam  Vitals reviewed. Constitutional: She is oriented to person, place, and time. She appears well-developed and well-nourished. No distress.  HENT:  Mouth/Throat: No oropharyngeal exudate.  Bilateral TMs are obscured by impacted cerumen Oropharynx with minimal erythema and clear PND.   Eyes: Conjunctivae and EOM are normal.  Neck: Normal range of motion. Neck supple.  Cardiovascular: Normal rate.   Pulmonary/Chest: Effort normal. No respiratory distress.  Lymphadenopathy:    She has no cervical adenopathy.  Neurological: She is alert and oriented to person, place, and time. She exhibits normal muscle tone.  Skin: Skin is warm and dry.    ED Course  EAR CERUMEN REMOVAL Date/Time: 03/14/2013 3:45 PM Performed by: Marcha Dutton, Keondre Markson Authorized by: Philipp Deputy C Consent: Verbal consent obtained. Risks and benefits: risks, benefits and alternatives were discussed Consent given by: patient Patient understanding: patient states understanding of the procedure being performed Patient identity confirmed: verbally with patient Local anesthetic: none Location details: left ear Procedure type: irrigation Patient tolerance: Patient tolerated the procedure well with no immediate complications. Comments: BOth EAC's irrigated, cleared.   (including critical care time) Labs Review Labs Reviewed - No data to display Imaging Review No results found.    MDM   1. Cerumen impaction   2. Otalgia of left ear      Irrigation of the bilateral EACs reveal the wax has been completely removed. The right TM has minor erythema in the periphery and the left TM has minor erythema scattered about the TM but in no particular pattern. Most likely this is due to ear wax contacting the TM for a prolonged period of time as well as forces of the irrigation for cerumen removal. I do not think she has an otitis media. Due to the pain in the TM will prescribe auralgan  eardrops. If There is no improvment in a couple days she should return for recheck to make sure she is not developing an otitis media.   Janne Napoleon, NP 03/14/13 1604  Janne Napoleon, NP 03/15/13 2216

## 2013-03-14 NOTE — ED Notes (Signed)
Pt here with c/o left ear pain radiating behind ear x 2 weeks unrelieved by Tylenol States her throat is hurting with swallowing  Denies fever,chills,n,v

## 2013-03-15 NOTE — ED Provider Notes (Signed)
Medical screening examination/treatment/procedure(s) were performed by non-physician practitioner and as supervising physician I was immediately available for consultation/collaboration.  Philipp Deputy, M.D.  Harden Mo, MD 03/15/13 1037

## 2013-03-17 ENCOUNTER — Other Ambulatory Visit: Payer: Self-pay

## 2013-03-17 DIAGNOSIS — Z1231 Encounter for screening mammogram for malignant neoplasm of breast: Secondary | ICD-10-CM

## 2013-03-17 DIAGNOSIS — Z853 Personal history of malignant neoplasm of breast: Secondary | ICD-10-CM

## 2013-03-17 DIAGNOSIS — Z9011 Acquired absence of right breast and nipple: Secondary | ICD-10-CM

## 2013-03-18 NOTE — ED Provider Notes (Signed)
Medical screening examination/treatment/procedure(s) were performed by non-physician practitioner and as supervising physician I was immediately available for consultation/collaboration.  Philipp Deputy, M.D.  Harden Mo, MD 03/18/13 [phone]

## 2013-03-22 ENCOUNTER — Encounter: Payer: Self-pay | Admitting: Family Medicine

## 2013-03-22 ENCOUNTER — Ambulatory Visit (INDEPENDENT_AMBULATORY_CARE_PROVIDER_SITE_OTHER): Payer: BC Managed Care – PPO | Admitting: Family Medicine

## 2013-03-22 VITALS — BP 142/70 | HR 94 | Temp 98.9°F | Wt 201.0 lb

## 2013-03-22 DIAGNOSIS — D492 Neoplasm of unspecified behavior of bone, soft tissue, and skin: Secondary | ICD-10-CM

## 2013-03-22 DIAGNOSIS — R7309 Other abnormal glucose: Secondary | ICD-10-CM

## 2013-03-22 DIAGNOSIS — R7303 Prediabetes: Secondary | ICD-10-CM

## 2013-03-22 DIAGNOSIS — R0982 Postnasal drip: Secondary | ICD-10-CM

## 2013-03-22 LAB — POCT GLYCOSYLATED HEMOGLOBIN (HGB A1C): Hemoglobin A1C: 6.1

## 2013-03-22 MED ORDER — LORATADINE-PSEUDOEPHEDRINE ER 5-120 MG PO TB12: 1.0000 | ORAL_TABLET | Freq: Two times a day (BID) | ORAL | Status: DC

## 2013-03-22 NOTE — Patient Instructions (Signed)
It was good to see you.  We are checking an A1c today. I will call you if this is NOT normal.  For your ears and throat,  - Use claritin D (decongestant and antihistamine) which should help with the postnasal drip. - Continue using salt water gargle. - Stay hydrated and get plenty of rest. Stay warm. - Use saline spray in your nose or a neti pot to wash out the sinuses. - Use debrox to loosen your ear wax. Use auralgan to numb your eardrums. - Come back in 2 weeks and we will wash out your ears and check out the growth on the left.  Take care. Come back if you develop fevers, chills, or trouble breathing.  Hilton Sinclair, MD

## 2013-03-22 NOTE — Progress Notes (Signed)
Patient ID: [name], female   DOB: 04/07/1960, 53 y.o.   MRN: [id number]  Subjective:   CC: Otalgia, sore throat, and hyperglycemia  HPI:   Otalgia and sore throat -  Patient has had about 1 month of 5-6/10 earache and sore throat for which she went to Urgent Care on 2/8 She had clear post-nasal drip and minimal o/p erythema on their exam, along with impacted cerumen bilaterally. They cleaned cerumen and saw TMs with minor erythema that they thought was due to cerumen impaction rather than OM. She was sent home with auralgan ear drops which helped, but pain is still there. It is worse at night with rhinorrhea and mild congestion present too. She has tried salt water gargle and losenges which temporarily help. Tramadol at night helps. She denies fevers, chills dyspnea, nausea, vomiting, diarrhea, or sick contacts.   Hyperglycemia -  Additionally, she reports blood sugar has been in 150s in the mornings recently, when previously it has been in the 80s in the mornings. She has been slightly dizzy occasionally and with blurred vision and headache. She reports increased thirst and frequency. She denies chest pain or dyspnea. She is on metformin XR 500mg  daily which she takes daily.    Review of Systems - Per HPI.   PMH: Reviewed: -No other new meds -No hospitalizations  Objective:  Physical Exam BP 142/70  Pulse 94  Temp(Src) 98.9 F (37.2 C) (Oral)  Wt 201 lb (91.173 kg) GEN: NAD, pleasant HEENT: Atraumatic, normocephalic, neck supple, EOMI, sclera clear, left EAM with 65mm firm skin tag with no erythema or tenderness, bilateral TMs mildly retracted with clear fluid behind, no erythema, no sinus tenderness, no anterior cervical or posterior cervical LAD; o/p mildly erythematous with no exudate. CV: RRR, no murmurs, rubs, or gallops PULM: CTAB, normal effort, mild coarseness throughout. ABD: Soft, nontender, nondistended SKIN: No rash or cyanosis; warm and well-perfused PSYCH: Mood  and affect euthymic, normal rate and volume of speech NEURO: Awake, alert, no focal deficits grossly, normal speech    Assessment:     [name] is a 53 y.o. female with here for otalgia, sore throat, and hyperglycemia.    Plan:     # See problem list and after visit summary for problem-specific plans.   # Health Maintenance: Not discussed.   Follow-up: Follow up in 2 weeks for ear cleanout and ere-evaluation of growth of left ear canal and postnasal drip symptoms.   Hilton Sinclair, MD [place]

## 2013-03-23 DIAGNOSIS — D492 Neoplasm of unspecified behavior of bone, soft tissue, and skin: Secondary | ICD-10-CM | POA: Insufficient documentation

## 2013-03-23 NOTE — Assessment & Plan Note (Signed)
With symptoms of hyperglycemia, concern for worsening blood sugar control. - Check A1c today.

## 2013-03-23 NOTE — Assessment & Plan Note (Signed)
>>  ASSESSMENT AND PLAN FOR DM (DIABETES MELLITUS) WITH COMPLICATIONS (HCC) WRITTEN ON 03/23/2013 12:15 PM BY THEKKEKANDAM, MARIA T, MD  With symptoms of hyperglycemia, concern for worsening blood sugar control. - Check A1c today.

## 2013-03-23 NOTE — Assessment & Plan Note (Addendum)
Most likely postnasal drip as no fever or exudate or sinus tenderness/fullness is present on exam or hx. - Prescribed claritin D. - Hydrate, rest. - Try saline spray or salt water gurgle. - If still not better on follow-up in 2 weeks, will trial antibiotics. - Return precautions reviewed.

## 2013-03-23 NOTE — Assessment & Plan Note (Signed)
Probably just skin tag but worry for a malignancy. Pt has h/o breast cancer. - Use debrox to clean TM as mild impaction made it difficult to fully visualize - Return in 2 weeks for washout. - At that time, if still present, refer to ENT for further evaluation.

## 2013-03-24 ENCOUNTER — Telehealth: Payer: Self-pay | Admitting: Family Medicine

## 2013-03-24 NOTE — Telephone Encounter (Signed)
Called and left message with person who answered for patient to call into clinic. When she calls, please let her know her A1c was the same as last time (6.1), which is still in the PRE-diabetic range. I encourage her to watch sweet beverages and carbohydrate intake. She can have 2 servings of carbs per meal (one serving for example is 1 slice of bread) and I encourage her to have a protein with each meal. We can discuss more when I see her in clinic.   Hilton Sinclair, MD

## 2013-04-09 ENCOUNTER — Ambulatory Visit (INDEPENDENT_AMBULATORY_CARE_PROVIDER_SITE_OTHER): Payer: BC Managed Care – PPO | Admitting: Family Medicine

## 2013-04-09 ENCOUNTER — Ambulatory Visit: Payer: BC Managed Care – PPO | Admitting: Family Medicine

## 2013-04-09 ENCOUNTER — Encounter: Payer: Self-pay | Admitting: Family Medicine

## 2013-04-09 VITALS — BP 122/86 | HR 80 | Temp 98.7°F | Ht 63.0 in | Wt 203.0 lb

## 2013-04-09 DIAGNOSIS — R7309 Other abnormal glucose: Secondary | ICD-10-CM

## 2013-04-09 DIAGNOSIS — D492 Neoplasm of unspecified behavior of bone, soft tissue, and skin: Secondary | ICD-10-CM

## 2013-04-09 DIAGNOSIS — E785 Hyperlipidemia, unspecified: Secondary | ICD-10-CM

## 2013-04-09 DIAGNOSIS — R0982 Postnasal drip: Secondary | ICD-10-CM

## 2013-04-09 DIAGNOSIS — R7303 Prediabetes: Secondary | ICD-10-CM

## 2013-04-09 DIAGNOSIS — M25549 Pain in joints of unspecified hand: Secondary | ICD-10-CM

## 2013-04-09 NOTE — Progress Notes (Signed)
Patient ID: Jermiya Reichl, female   DOB: [birth year], 53 y.o.   MRN: [id number] Subjective:   CC: Follow-up of left ear mass  HPI:   Growth of ear canal: Patient reports postnasal drip has resolved with claritin D. Left ear has not had any symptoms of pain, drainage, or hearing changes and she has not had fevers or chills. She has been using debrox.   Statin intolerance: Pt reports myalgia to crestor as well so has not been taking it.  Review of Systems - Per HPI.      Objective:  Physical Exam BP 122/86  Pulse 80  Temp(Src) 98.7 F (37.1 C) (Oral)  Ht 5\' 3"  (1.6 m)  Wt 203 lb (92.08 kg)  BMI 35.97 kg/m2 GEN: NAD, pleasant CV: Pulses equal bilateral radial pulse HEENT: Left TM obscured partially by cartilagenous-appearing mass obscuring most of EAM, possibly skin tag. No erythema or tenderness; TM clear bilaterally/mildly retracted with clear fluid behind NEURO: Normal speech and gait, no focal deficits Assessment:     Tesla Keeler is a 53 y.o. female with h/o left ear mass here for follow-up.    Plan:    Hilton Sinclair, MD [place]

## 2013-04-09 NOTE — Patient Instructions (Signed)
It was good to see you today.  We washed out your left ear and saw the mass still in there. This is likely a skin tag but I would like an ear-nose-throat doctor to evaluate just given your history of breast cancer.  For your A1c, limit carbohydrates to 2 portions per meal with one good protein per meal. See handout below.  I will look into what we decided at previous visit about cholesterol medication.  For your hand pain, I am so glad it is better!  Hilton Sinclair, MD     Diet Recommendations for Diabetes   Starchy (carb) foods include: Bread, rice, pasta, potatoes, corn, crackers, bagels, muffins, all baked goods.  (Fruits, milk, and yogurt also have carbohydrate, but most of these foods will not spike your blood sugar as the starchy foods will.)  A few fruits do cause high blood sugars; use small portions of bananas (limit to 1/2 at a time), grapes, and most tropical fruits.    Protein foods include: Meat, fish, poultry, eggs, dairy foods, and beans such as pinto and kidney beans (beans also provide carbohydrate).   1. Eat at least 3 meals and 1-2 snacks per day. Never go more than 4-5 hours while awake without eating.  2. Limit starchy foods to TWO per meal and ONE per snack. ONE portion of a starchy  food is equal to the following:   - ONE slice of bread (or its equivalent, such as half of a hamburger bun).   - 1/2 cup of a "scoopable" starchy food such as potatoes or rice.   - 15 grams of carbohydrate as shown on food label.  3. Both lunch and dinner should include a protein food, a carb food, and vegetables.   - Obtain twice as many veg's as protein or carbohydrate foods for both lunch and dinner.   - Fresh or frozen veg's are best.   - Try to keep frozen veg's on hand for a quick vegetable serving.    4. Breakfast should always include protein.

## 2013-04-10 NOTE — Assessment & Plan Note (Signed)
Per A1c last visit of 6.1.  - Reiterated dietary control with monitoring sweet beverage and carb intake: 1 protein and no more than 2 carbohydrates per meal. - Handout re: diabetic diet provided.

## 2013-04-10 NOTE — Assessment & Plan Note (Signed)
Resolved with claritin D.

## 2013-04-10 NOTE — Addendum Note (Signed)
Addended by: Conni Slipper T on: 04/10/2013 07:06 PM   Modules accepted: Orders

## 2013-04-10 NOTE — Assessment & Plan Note (Signed)
Resolved after visit to hand surgeon with wax treatment x 1, though patient feels improvement was due to removing meat from diet.

## 2013-04-10 NOTE — Assessment & Plan Note (Signed)
Patient used debrox over the past 2 weeks and we washed out ear, with mass still present and better visualized. With h/o breast cancer, consider malignancy though benign skin tag is more likely.  - Referral placed to ENT for further eval.

## 2013-04-10 NOTE — Assessment & Plan Note (Addendum)
Patient should be on high-intensity statin. Did not tolerate change from pravastatin to lipitor or lipitor to crestor due to myalgias. - Will ask pharmacy for recommendations, including benefit of rechecking TSH or vitamin D (levels normal 11/2011), alternate day dosing, CoQ10 (recommended against in Up-To-Date), or returning to pravastatin >>>pharmacy suggests restarting last tolerable agent and titrating to max dose tolerated (then recheck LDL and if at goal for lowering (50%) from baseline, stop, but it seems patient has been intolerant to all agents.

## 2013-04-10 NOTE — Assessment & Plan Note (Signed)
>>  ASSESSMENT AND PLAN FOR DM (DIABETES MELLITUS) WITH COMPLICATIONS ([facility]) WRITTEN ON 04/10/2013  7:01 PM BY THEKKEKANDAM, MARIA T, MD  Per A1c last visit of 6.1.  - Reiterated dietary control with monitoring sweet beverage and carb intake: 1 protein and no more than 2 carbohydrates per meal. - Handout re: diabetic diet provided.

## 2013-04-14 DIAGNOSIS — D169 Benign neoplasm of bone and articular cartilage, unspecified: Secondary | ICD-10-CM | POA: Diagnosis not present

## 2013-04-23 ENCOUNTER — Ambulatory Visit
Admission: RE | Admit: 2013-04-23 | Discharge: 2013-04-23 | Disposition: A | Discharge status: Home / Self Care | Payer: BC Managed Care – PPO | Source: Ambulatory Visit

## 2013-04-23 DIAGNOSIS — Z1231 Encounter for screening mammogram for malignant neoplasm of breast: Secondary | ICD-10-CM | POA: Diagnosis not present

## 2013-04-23 DIAGNOSIS — Z9011 Acquired absence of right breast and nipple: Secondary | ICD-10-CM

## 2013-04-23 DIAGNOSIS — Z853 Personal history of malignant neoplasm of breast: Secondary | ICD-10-CM

## 2013-04-29 ENCOUNTER — Telehealth: Payer: Self-pay | Admitting: Family Medicine

## 2013-04-29 NOTE — Telephone Encounter (Signed)
Patient needs her cholesterol medicine called in to pharmacy.  She uses [facility] on [address].

## 2013-05-04 MED ORDER — SIMVASTATIN 20 MG PO TABS: 20.0000 mg | ORAL_TABLET | Freq: Every day | ORAL | Status: DC

## 2013-05-04 NOTE — Telephone Encounter (Signed)
She had been intolerant to many statins, so I have been trying to decide what to put her on. Will try low-dose simvastatin. Sending to pharmacy.  Hilton Sinclair, MD

## 2013-05-06 ENCOUNTER — Encounter: Payer: Self-pay | Admitting: Sports Medicine

## 2013-05-06 ENCOUNTER — Ambulatory Visit (INDEPENDENT_AMBULATORY_CARE_PROVIDER_SITE_OTHER): Payer: BC Managed Care – PPO | Admitting: Sports Medicine

## 2013-05-06 VITALS — BP 138/82 | Ht 63.0 in | Wt 190.0 lb

## 2013-05-06 DIAGNOSIS — M7660 Achilles tendinitis, unspecified leg: Secondary | ICD-10-CM

## 2013-05-06 DIAGNOSIS — M7661 Achilles tendinitis, right leg: Secondary | ICD-10-CM

## 2013-05-07 ENCOUNTER — Other Ambulatory Visit: Payer: Self-pay | Admitting: Oncology

## 2013-05-07 DIAGNOSIS — C50919 Malignant neoplasm of unspecified site of unspecified female breast: Secondary | ICD-10-CM

## 2013-05-07 NOTE — Progress Notes (Signed)
   Subjective:    Patient ID: [name], female    DOB: 1960/06/12, 53 y.o.   MRN: [id number]  HPI chief complaint: Right heel pain  Patient comes in today complaining of 2 months of right heel pain. She has been seen previously and treated for posterior tibialis tendinopathy of both feet. She has custom orthotics which she is not wearing today. Majority of her pain in the right heel is at the insertion of the Achilles tendon. It is not along the course of the posterior tibialis tendon. She describes it as an aching discomfort that is worse with weightbearing. No numbness or tingling. She has noticed some mild soft tissue swelling. She denies any recent trauma.    Review of Systems     Objective:   Physical Exam Well-developed, no acute distress. Awake alert and oriented x3. Vital signs reviewed.  Examination of each foot shows pronounced pes planus bilaterally with some mild soft tissue swelling along the course of the posterior tibialis tendon. The right foot shows no tenderness to palpation along the posterior tibialis tendon but there is tenderness to palpation at the calcaneal insertion of the Achilles tendon. Reproducible pain with Achilles stretches. Neurovascularly intact distally. Walking with a slight limp.       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 She will return to the office early next week and we will add a 5/16 inch heel lift to each insert (Amy or [place] can do this in my absence). She is familiar with [place] heel drop exercises as she has been doing them for her posterior tibialis tendinopathy. She will simply change her foot position on the step so that she is now focusing more on her Achilles tendon. She will do these exercises daily and will utilize icing as needed. She can also utilize compression as needed. Followup  with me in 4 weeks for a recheck.

## 2013-05-19 DIAGNOSIS — D169 Benign neoplasm of bone and articular cartilage, unspecified: Secondary | ICD-10-CM | POA: Diagnosis not present

## 2013-05-21 ENCOUNTER — Telehealth: Payer: Self-pay | Admitting: Family Medicine

## 2013-05-21 DIAGNOSIS — I10 Essential (primary) hypertension: Secondary | ICD-10-CM

## 2013-05-21 DIAGNOSIS — D693 Immune thrombocytopenic purpura: Secondary | ICD-10-CM

## 2013-05-21 DIAGNOSIS — E876 Hypokalemia: Secondary | ICD-10-CM

## 2013-05-21 NOTE — Telephone Encounter (Signed)
Pt called to let Dr. Dianah Field know that she is having surgery on her ear on the 28th. She was also told she needed to have her platelets check. Can you order this for her to come in. jw

## 2013-05-21 NOTE — Telephone Encounter (Signed)
Will forward to PCP 

## 2013-05-24 DIAGNOSIS — D169 Benign neoplasm of bone and articular cartilage, unspecified: Secondary | ICD-10-CM | POA: Diagnosis not present

## 2013-05-24 DIAGNOSIS — Z0000 Encounter for general adult medical examination without abnormal findings: Secondary | ICD-10-CM | POA: Diagnosis not present

## 2013-05-25 NOTE — Telephone Encounter (Signed)
Called pt.  She states that she already has the lab work set up. [name]

## 2013-05-25 NOTE — Telephone Encounter (Signed)
Let her know I ordered a CBC and BMET (because she had mild hypokalemia previously and to monitor creatinine). She can schedule a lab only appt with [place] to get these drawn.  Also thank her for letting me know about her surgery.  Hilton Sinclair, MD

## 2013-05-26 ENCOUNTER — Telehealth: Payer: Self-pay | Admitting: *Deleted

## 2013-05-26 NOTE — Telephone Encounter (Signed)
Received a call from Young Harris, Therapist, sports at [facility]. Glenda reported the patient Platelet level was 81 and wants to know if there is any treatment recommendation before the procedure (excision of the ear canal). Please call Glenda at [phone]. Holley Raring is faxing over lab results for provider review. Derl Barrow, RN

## 2013-05-28 NOTE — Telephone Encounter (Signed)
Spoke with Holley Raring at [facility] about platelets. Platelet of 81 is around patient's baseline, and with superficial surgery she should be okay with topical hemostasis during procedure and not need pre-procedure IVIG or glucocorticoids. Precepted with Dr McDiarmid who agrees.  Hilton Sinclair, MD

## 2013-05-28 NOTE — Telephone Encounter (Signed)
I believe that at PLT of 81, we do not need to pretreat patient with IVIG or glucocorticoids in preparation for minor surgery (removal of ear skin tag) but will precept this today to be sure.  Danielle Sinclair, MD

## 2013-06-01 ENCOUNTER — Other Ambulatory Visit: Payer: Self-pay | Admitting: Otolaryngology

## 2013-06-01 DIAGNOSIS — H61899 Other specified disorders of external ear, unspecified ear: Secondary | ICD-10-CM | POA: Diagnosis not present

## 2013-06-01 DIAGNOSIS — D492 Neoplasm of unspecified behavior of bone, soft tissue, and skin: Secondary | ICD-10-CM | POA: Diagnosis not present

## 2013-06-01 DIAGNOSIS — D2320 Other benign neoplasm of skin of unspecified ear and external auricular canal: Secondary | ICD-10-CM | POA: Diagnosis not present

## 2013-06-01 HISTORY — PX: INNER EAR SURGERY: SHX679

## 2013-06-03 ENCOUNTER — Ambulatory Visit: Payer: Medicare Other | Admitting: Sports Medicine

## 2013-06-17 ENCOUNTER — Ambulatory Visit (INDEPENDENT_AMBULATORY_CARE_PROVIDER_SITE_OTHER): Payer: BC Managed Care – PPO | Admitting: General Surgery

## 2013-06-25 ENCOUNTER — Encounter (INDEPENDENT_AMBULATORY_CARE_PROVIDER_SITE_OTHER): Payer: Self-pay | Admitting: General Surgery

## 2013-06-25 ENCOUNTER — Ambulatory Visit (INDEPENDENT_AMBULATORY_CARE_PROVIDER_SITE_OTHER): Payer: BC Managed Care – PPO | Admitting: General Surgery

## 2013-06-25 VITALS — BP 132/86 | HR 88 | Temp 99.5°F | Resp 18 | Ht 66.0 in | Wt 202.4 lb

## 2013-06-25 DIAGNOSIS — C50919 Malignant neoplasm of unspecified site of unspecified female breast: Secondary | ICD-10-CM

## 2013-06-25 NOTE — Progress Notes (Signed)
Patient ID: [name], female   DOB: 05-01-1960, 53 y.o.   MRN: [id number] History:  This patient returns for long-term followup regarding her right breast cancer.  She initially underwent insertion of Port-A-Cath Right right total mastectomy and sentinel node biopsy and placement of tissue expander on 07/21/2009. 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 She had adjuvant chemotherapy was then placed on tamoxifen. Port-A-Cath is out. She developed a skin recurrence on the right and on 09/26/2010 she underwent a right partial mastectomy and wide local excision by me. She was able to retain her implant on the right.This showed recurrent invasive cancer in the skin. She had adjuvant radiation therapy. This has not recurred.  Mammograms of the left breast on 04/23/2013 are normal, category 1. She continues on tamoxifen and sees Dr. Marcy Panning every 6 months.  She keeps in touch with Dr. Crissie Reese and is considering a reduction mammoplasty on the left.    ROS: 10 system review of systems is negative except as described above.  Past history, family history, social history is documented on the chart, unchanged, and noncontributory except as noted above.  Exam: Patient looks well. No distress.  Neck no adenopathy or mass  Lungs clear to auscultation bilaterally  Heart regular rate and rhythm. No murmur. No ectopy  Breast right mastectomy incision well-healed. Implant in place. Skin and soft tissues feel normal. No nodules. No ulcerations. No axillary adenopathy. Good range of motion right shoulder. Left breast large. Skin healthy. Nipple and areola healthy. No palpable mass. No axillary adenopathy   Assessment: Multifocal invasive carcinoma right breast, status post right total mastectomy sentinel node biopsy and insertion of Port-A-Cath on 07/21/2009, followed by adjuvant chemotherapy. This was invasive carcinoma, HER-2  negative, receptor positive, pathologic stage T2, N0.  History skin recurrence right mastectomy wound, treated with right partial mastectomy, salvage of implant, postop radiation therapy. No evidence of recurrence to date  Diabetes, type II. Hypertension Hyperlipidemia  Plan: Keep regular appointments with Dr. Humphrey Rolls Continue tamoxifen.  Return to see me in one year  annual left breast mammogram in March, 2016 She was encouraged to go by Dr. Maricela Curet office and make an appointment for consideration of left reduction mammoplasty for symmetry.   Edsel Petrin. Dalbert Batman, M.D., [facility]  General and Minimally invasive Surgery  Breast and Colorectal Surgery  Office: [phone]  Pager: [phone]

## 2013-06-25 NOTE — Patient Instructions (Signed)
Examination of your right mastectomy site, left breast, and all the regional lymph node areas is normal. Your mammograms are also normal. There is no evidence of cancer.  Continued to take the tamoxifen medication  Keep her appointment with Dr. Humphrey Rolls next month  Be sure to get your mammograms in March of 2016  Return to see Dr. Dalbert Batman in one year.

## 2013-07-21 DIAGNOSIS — N62 Hypertrophy of breast: Secondary | ICD-10-CM | POA: Diagnosis not present

## 2013-07-21 DIAGNOSIS — C50919 Malignant neoplasm of unspecified site of unspecified female breast: Secondary | ICD-10-CM | POA: Diagnosis not present

## 2013-08-11 ENCOUNTER — Other Ambulatory Visit: Payer: Self-pay | Admitting: Oncology

## 2013-08-11 DIAGNOSIS — C50919 Malignant neoplasm of unspecified site of unspecified female breast: Secondary | ICD-10-CM

## 2013-08-12 ENCOUNTER — Telehealth: Payer: Self-pay | Admitting: Hematology and Oncology

## 2013-08-12 ENCOUNTER — Other Ambulatory Visit: Payer: Self-pay | Admitting: *Deleted

## 2013-08-12 NOTE — Telephone Encounter (Signed)
, °

## 2013-08-17 ENCOUNTER — Other Ambulatory Visit: Payer: Self-pay | Admitting: *Deleted

## 2013-08-17 DIAGNOSIS — C50919 Malignant neoplasm of unspecified site of unspecified female breast: Secondary | ICD-10-CM

## 2013-08-17 MED ORDER — TAMOXIFEN CITRATE 20 MG PO TABS: ORAL_TABLET | ORAL | Status: DC

## 2013-08-26 ENCOUNTER — Other Ambulatory Visit: Payer: BC Managed Care – PPO

## 2013-08-26 ENCOUNTER — Ambulatory Visit: Payer: BC Managed Care – PPO | Admitting: Oncology

## 2013-09-08 DIAGNOSIS — C50919 Malignant neoplasm of unspecified site of unspecified female breast: Secondary | ICD-10-CM | POA: Diagnosis not present

## 2013-09-08 DIAGNOSIS — N62 Hypertrophy of breast: Secondary | ICD-10-CM | POA: Diagnosis not present

## 2013-09-21 ENCOUNTER — Other Ambulatory Visit: Payer: Self-pay | Admitting: Plastic Surgery

## 2013-09-21 DIAGNOSIS — N62 Hypertrophy of breast: Secondary | ICD-10-CM | POA: Diagnosis not present

## 2013-09-21 DIAGNOSIS — N651 Disproportion of reconstructed breast: Secondary | ICD-10-CM | POA: Diagnosis not present

## 2013-09-21 DIAGNOSIS — Z853 Personal history of malignant neoplasm of breast: Secondary | ICD-10-CM | POA: Diagnosis not present

## 2013-09-23 ENCOUNTER — Ambulatory Visit: Admit: 2013-09-23 | Payer: Self-pay | Admitting: Plastic Surgery

## 2013-09-23 SURGERY — MAMMOPLASTY, REDUCTION
Anesthesia: General | Site: Breast | Laterality: Left

## 2013-10-19 ENCOUNTER — Other Ambulatory Visit: Payer: Self-pay | Admitting: Family Medicine

## 2013-10-22 ENCOUNTER — Other Ambulatory Visit: Payer: Self-pay | Admitting: Oncology

## 2013-10-22 DIAGNOSIS — C50919 Malignant neoplasm of unspecified site of unspecified female breast: Secondary | ICD-10-CM

## 2013-11-05 ENCOUNTER — Ambulatory Visit: Payer: BC Managed Care – PPO

## 2013-11-24 ENCOUNTER — Ambulatory Visit (HOSPITAL_BASED_OUTPATIENT_CLINIC_OR_DEPARTMENT_OTHER): Payer: BC Managed Care – PPO

## 2013-11-24 ENCOUNTER — Telehealth: Payer: Self-pay | Admitting: Hematology and Oncology

## 2013-11-24 ENCOUNTER — Ambulatory Visit (HOSPITAL_BASED_OUTPATIENT_CLINIC_OR_DEPARTMENT_OTHER): Payer: BC Managed Care – PPO | Admitting: Hematology and Oncology

## 2013-11-24 ENCOUNTER — Other Ambulatory Visit: Payer: BC Managed Care – PPO

## 2013-11-24 VITALS — BP 109/70 | HR 87 | Temp 98.7°F | Resp 18 | Ht 66.0 in | Wt 198.7 lb

## 2013-11-24 DIAGNOSIS — C50919 Malignant neoplasm of unspecified site of unspecified female breast: Secondary | ICD-10-CM

## 2013-11-24 DIAGNOSIS — Z853 Personal history of malignant neoplasm of breast: Secondary | ICD-10-CM

## 2013-11-24 DIAGNOSIS — Z79810 Long term (current) use of selective estrogen receptor modulators (SERMs): Secondary | ICD-10-CM

## 2013-11-24 DIAGNOSIS — C50111 Malignant neoplasm of central portion of right female breast: Secondary | ICD-10-CM

## 2013-11-24 LAB — CBC WITH DIFFERENTIAL/PLATELET
BASO%: 1.7 % (ref 0.0–2.0)
Basophils Absolute: 0.1 10*3/uL (ref 0.0–0.1)
EOS ABS: 0.1 10*3/uL (ref 0.0–0.5)
EOS%: 1.5 % (ref 0.0–7.0)
HCT: 39.6 % (ref 34.8–46.6)
HGB: 12.2 g/dL (ref 11.6–15.9)
LYMPH%: 34.5 % (ref 14.0–49.7)
MCH: 23.9 pg — ABNORMAL LOW (ref 25.1–34.0)
MCHC: 30.9 g/dL — ABNORMAL LOW (ref 31.5–36.0)
MCV: 77.4 fL — AB (ref 79.5–101.0)
MONO#: 0.6 10*3/uL (ref 0.1–0.9)
MONO%: 7.7 % (ref 0.0–14.0)
NEUT#: 4.6 10*3/uL (ref 1.5–6.5)
NEUT%: 54.6 % (ref 38.4–76.8)
Platelets: 90 10*3/uL — ABNORMAL LOW (ref 145–400)
RBC: 5.11 10*6/uL (ref 3.70–5.45)
RDW: 14 % (ref 11.2–14.5)
WBC: 8.4 10*3/uL (ref 3.9–10.3)
lymph#: 2.9 10*3/uL (ref 0.9–3.3)

## 2013-11-24 LAB — COMPREHENSIVE METABOLIC PANEL (CC13)
ALBUMIN: 3.3 g/dL — AB (ref 3.5–5.0)
ALK PHOS: 47 U/L (ref 40–150)
ALT: 39 U/L (ref 0–55)
AST: 44 U/L — AB (ref 5–34)
Anion Gap: 10 mEq/L (ref 3–11)
BUN: 13.2 mg/dL (ref 7.0–26.0)
CO2: 29 mEq/L (ref 22–29)
Calcium: 9.4 mg/dL (ref 8.4–10.4)
Chloride: 103 mEq/L (ref 98–109)
Creatinine: 0.9 mg/dL (ref 0.6–1.1)
GLUCOSE: 97 mg/dL (ref 70–140)
Potassium: 3.2 mEq/L — ABNORMAL LOW (ref 3.5–5.1)
SODIUM: 142 meq/L (ref 136–145)
TOTAL PROTEIN: 7 g/dL (ref 6.4–8.3)
Total Bilirubin: 0.22 mg/dL (ref 0.20–1.20)

## 2013-11-24 NOTE — Assessment & Plan Note (Signed)
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 ER/PR positive PR positive HER-2 negative with a Ki-67 of 11%, Oncotype DX recurrence score 27, 18% ROR, status post adjuvant chemotherapy with Taxotere and Cytoxan x4 cycles completed November 2011 followed by breast reconstruction and tamoxifen 20 mg daily since January 2012, subcutaneous recurrence status post resection and radiation, now continues on tamoxifen for plan of 10 years.  Surveillance: I. reviewed the mammogram report on the left breast from March 2015 which was normal. Today's breast exam does not reveal any major abnormalities. Reconstructed right breast appears to be normal. I encouraged her to continue annual surveillance mammograms and followups.  Survivorship: Patient had lost 20 pounds intentionally. I encouraged her to continue to stay active and do exercise along with watching her diet. She takes Effexor for hot flashes which is helping her.

## 2013-11-24 NOTE — Progress Notes (Signed)
Patient Care Team: Hilton Sinclair, MD as PCP - General  DIAGNOSIS: Cancer of central portion of right female breast   Primary site: Breast (Right)   Staging method: AJCC 7th Edition   Clinical: Stage IIA (T2, N0, cM0) signed by Deatra Robinson, MD on 03/07/2013  7:21 PM   Summary: Stage IIA (T2, N0, cM0)  PRIOR THERAPY:  #1 patient initially underwent a right mastectomy in June 2011 with the final pathology revealing 2 foci of invasive ductal carcinoma, comment 1 measuring 2.2 cm and a second one measuring 0.5 cm. The tumor was ER positive PR positive HER-2/neu negative with a proliferation marker 11%.  #2 the patient had Oncotype DX testing done that revealed recurrence score of 27 with 18% her risk of distant recurrence with tamoxifen alone.  #3 patient had adjuvant chemotherapy consisting of Taxotere and Cytoxan every 3 weeks for a total of 4 cycles from 10/23/2009 2 12/25/2009.  #4 patient had reconstruction performed by Dr. Crissie Reese.  #5 she was then begun on tamoxifen 20 mg daily in January 2012.  #6 patient then developed a subcutaneous nodule at the site of her original mastectomy scar. This was resected. She then went on to have radiation therapy locally to this to the mastectomy site.  #7 patient continues on tamoxifen 20 mg daily.  #8 Effexor XR 37.5 mg daily for hot flashes  9. left breast reduction surgery August 2015  CURRENT THERAPY: Tamoxifen 20 mg daily  CHIEF COMPLIANT: Followup of breast cancer  INTERVAL HISTORY: [name] is a 14 year of mitral lady with above-mentioned history of right -sided breast cancer status post mastectomy and reconstruction followed by recurrence in the mastectomy scar that was resected and radiated. She is currently on tamoxifen since 2012. She is tolerating it extremely well other than hot flashes. She takes Effexor which is helping her hot flashes. She denies any lumps or nodules in the breast. She is exercising daily and lost 20  pounds.  REVIEW OF SYSTEMS:   Constitutional: Denies fevers, chills or abnormal weight loss Eyes: Denies blurriness of vision Ears, nose, mouth, throat, and face: Denies mucositis or sore throat Respiratory: Denies cough, dyspnea or wheezes Cardiovascular: Denies palpitation, chest discomfort or lower extremity swelling Gastrointestinal:  Denies nausea, heartburn or change in bowel habits Skin: Denies abnormal skin rashes Lymphatics: Denies new lymphadenopathy or easy bruising Neurological:Denies numbness, tingling or new weaknesses Behavioral/Psych: Mood is stable, no new changes  Breast:  denies any pain or lumps or nodules in either breasts All other systems were reviewed with the patient and are negative.  I have reviewed the past medical history, past surgical history, social history and family history with the patient and they are unchanged from previous note.  ALLERGIES:  has No Known Allergies.  MEDICATIONS:  Current Outpatient Prescriptions  Medication Sig Dispense Refill  . Acetaminophen 650 MG TABS Take 1 tablet (650 mg total) by mouth 3 (three) times daily as needed.  30 tablet  0  . amLODipine (NORVASC) 5 MG tablet TAKE ONE TABLET BY MOUTH EVERY DAY  30 tablet  11  . carvedilol (COREG) 25 MG tablet Take 1 tablet (25 mg total) by mouth 2 (two) times daily with a meal.  60 tablet  11  . hydrochlorothiazide (HYDRODIURIL) 25 MG tablet TAKE ONE TABLET BY MOUTH ONCE DAILY  30 tablet  11  . metFORMIN (GLUCOPHAGE-XR) 500 MG 24 hr tablet TAKE ONE TABLET BY MOUTH ONCE DAILY WITH BREAKFAST.  90 tablet  0  . tamoxifen (NOLVADEX) 20 MG tablet TAKE ONE TABLET BY MOUTH ONCE DAILY  30 tablet  3  . traMADol (ULTRAM) 50 MG tablet Take 1 tablet (50 mg total) by mouth 2 (two) times daily as needed.  30 tablet  0  . venlafaxine XR (EFFEXOR-XR) 37.5 MG 24 hr capsule TAKE ONE CAPSULE BY MOUTH ONCE DAILY  30 capsule  1   No current facility-administered medications for this visit.    PHYSICAL  EXAMINATION: ECOG PERFORMANCE STATUS: 0 - Asymptomatic  Filed Vitals:   11/24/13 1417  BP: 109/70  Pulse: 87  Temp: 98.7 F (37.1 C)  Resp: 18   Filed Weights   11/24/13 1417  Weight: 198 lb 11.2 oz (90.13 kg)    GENERAL:alert, no distress and comfortable SKIN: skin color, texture, turgor are normal, no rashes or significant lesions EYES: normal, Conjunctiva are pink and non-injected, sclera clear OROPHARYNX:no exudate, no erythema and lips, buccal mucosa, and tongue normal  NECK: supple, thyroid normal size, non-tender, without nodularity LYMPH:  no palpable lymphadenopathy in the cervical, axillary or inguinal LUNGS: clear to auscultation and percussion with normal breathing effort HEART: regular rate & rhythm and no murmurs and no lower extremity edema ABDOMEN:abdomen soft, non-tender and normal bowel sounds Musculoskeletal:no cyanosis of digits and no clubbing  NEURO: alert & oriented x 3 with fluent speech, no focal motor/sensory deficits BREAST: No palpable masses or nodules in either right or left breasts. Reconstructive right breast is without any issues. Left breast recent reduction surgery the scars are well healed. No palpable axillary supraclavicular or infraclavicular adenopathy no breast tenderness or nipple discharge.   LABORATORY DATA:  I have reviewed the data as listed   Chemistry      Component Value Date/Time   NA 144 02/26/2013 0801   NA 140 11/13/2012 0957   K 3.3* 02/26/2013 0801   K 3.3* 11/13/2012 0957   CL 100 11/13/2012 0957   CL 105 02/21/2012 0806   CO2 30* 02/26/2013 0801   CO2 34* 11/13/2012 0957   BUN 12.5 02/26/2013 0801   BUN 8 11/13/2012 0957   CREATININE 0.8 02/26/2013 0801   CREATININE 0.75 11/13/2012 0957   CREATININE 0.82 07/22/2011 0801      Component Value Date/Time   CALCIUM 9.5 02/26/2013 0801   CALCIUM 9.4 11/13/2012 0957   ALKPHOS 44 02/26/2013 0801   ALKPHOS 48 11/13/2012 0957   AST 26 02/26/2013 0801   AST 31 11/13/2012 0957    ALT 29 02/26/2013 0801   ALT 30 11/13/2012 0957   BILITOT 0.44 02/26/2013 0801   BILITOT 0.4 11/13/2012 0957       Lab Results  Component Value Date   WBC 6.5 02/26/2013   HGB 13.0 02/26/2013   HCT 40.1 02/26/2013   MCV 77.4* 02/26/2013   PLT 99* 02/26/2013   NEUTROABS 3.6 02/26/2013     RADIOGRAPHIC STUDIES: I have personally reviewed the radiology reports and agreed with their findings. 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 ER/PR positive PR positive HER-2 negative with a Ki-67 of 11%, Oncotype DX recurrence score 27, 18% ROR, status post adjuvant chemotherapy with Taxotere and Cytoxan x4 cycles completed November 2011 followed by breast reconstruction and tamoxifen 20 mg daily since January 2012, subcutaneous recurrence status post resection and radiation, now continues  on tamoxifen for plan of 10 years.  Surveillance: I. reviewed the mammogram report on the left breast from March 2015 which was normal. Today's breast exam does not reveal any major abnormalities. Reconstructed right breast appears to be normal. I encouraged her to continue annual surveillance mammograms and followups.  Survivorship: Patient had lost 20 pounds intentionally. I encouraged her to continue to stay active and do exercise along with watching her diet. She takes Effexor for hot flashes which is helping her.   Orders Placed This Encounter  Procedures  . CBC with Differential    Standing Status: Future     Number of Occurrences:      Standing Expiration Date: 11/24/2014  . Comprehensive metabolic panel (Cmet) - [facility]    Standing Status: Future     Number of Occurrences:      Standing Expiration Date: 11/24/2014  . CBC with Differential    Standing Status: Future     Number of Occurrences:      Standing Expiration Date: 11/24/2014  .  Comprehensive metabolic panel (Cmet) - [facility]    Standing Status: Future     Number of Occurrences:      Standing Expiration Date: 11/24/2014   The patient has a good understanding of the overall plan. she agrees with it. She will call with any problems that may develop before her next visit here.  I spent 15 minutes counseling the patient face to face. The total time spent in the appointment was 20 minutes and more than 50% was on counseling and review of test results    Rulon Eisenmenger, MD 11/24/2013 2:43 PM

## 2013-11-24 NOTE — Telephone Encounter (Signed)
per pof to sch pt appt-gave pt copy of sch °

## 2013-11-24 NOTE — Progress Notes (Signed)
Called patient at 14 with lab results.  Let pt know I would mail copy of the labs and listings of potassium rich foods.  Let pt know she should follow up with her PCP to manage her potassium.  Pt voiced understanding.

## 2013-11-25 ENCOUNTER — Ambulatory Visit: Payer: BC Managed Care – PPO | Admitting: Hematology and Oncology

## 2013-11-25 ENCOUNTER — Other Ambulatory Visit: Payer: BC Managed Care – PPO

## 2013-11-30 ENCOUNTER — Encounter: Payer: Self-pay | Admitting: Family Medicine

## 2013-11-30 ENCOUNTER — Ambulatory Visit (INDEPENDENT_AMBULATORY_CARE_PROVIDER_SITE_OTHER): Payer: BLUE CROSS/BLUE SHIELD | Admitting: Family Medicine

## 2013-11-30 VITALS — BP 119/79 | HR 78 | Temp 98.5°F | Wt 200.0 lb

## 2013-11-30 DIAGNOSIS — E876 Hypokalemia: Secondary | ICD-10-CM | POA: Diagnosis not present

## 2013-11-30 DIAGNOSIS — I10 Essential (primary) hypertension: Secondary | ICD-10-CM | POA: Diagnosis not present

## 2013-11-30 DIAGNOSIS — L659 Nonscarring hair loss, unspecified: Secondary | ICD-10-CM

## 2013-11-30 DIAGNOSIS — R5382 Chronic fatigue, unspecified: Secondary | ICD-10-CM

## 2013-11-30 DIAGNOSIS — M159 Polyosteoarthritis, unspecified: Secondary | ICD-10-CM

## 2013-11-30 DIAGNOSIS — D693 Immune thrombocytopenic purpura: Secondary | ICD-10-CM

## 2013-11-30 DIAGNOSIS — M150 Primary generalized (osteo)arthritis: Secondary | ICD-10-CM

## 2013-11-30 LAB — TSH: TSH: 0.679 u[IU]/mL (ref 0.350–4.500)

## 2013-11-30 MED ORDER — POTASSIUM CHLORIDE CRYS ER 20 MEQ PO TBCR: 40.0000 meq | EXTENDED_RELEASE_TABLET | Freq: Every day | ORAL | Status: DC

## 2013-11-30 MED ORDER — TRAMADOL HCL 50 MG PO TABS: 50.0000 mg | ORAL_TABLET | Freq: Two times a day (BID) | ORAL | Status: DC | PRN

## 2013-11-30 NOTE — Progress Notes (Signed)
    Subjective:    Patient ID: [name] is a 53 y.o. female presenting with low potassium and Hip Pain  on 11/30/2013  HPI: Seen by Cancer MD who checked labs and noted low K+. Also has pain in right hip.  Notes long h/o arthritis.  Worse with weather and notes it comes and goes.  Notes pain is in sciatica region, but notes no radiation to pain.  Has h/o heel spurs.  Usually uses Ultram, but out of medication. Seen my oncology recently and told of low platelets.  Denies bleeding. Also with low potassium--she is on HCTZ.  Review of Systems  Constitutional: Negative for fever and chills.  Respiratory: Negative for shortness of breath.   Cardiovascular: Negative for chest pain.  Gastrointestinal: Negative for nausea, vomiting and abdominal pain.  Genitourinary: Negative for dysuria.  Musculoskeletal: Positive for back pain (over sciatic notch).  Skin: Negative for rash.      Objective:    BP 119/79  Pulse 78  Temp(Src) 98.5 F (36.9 C) (Oral)  Wt 200 lb (90.719 kg) Physical Exam  Constitutional: She is oriented to person, place, and time. She appears well-developed and well-nourished. No distress.  HENT:  Head: Normocephalic and atraumatic.  Eyes: No scleral icterus.  Neck: Neck supple.  Cardiovascular: Normal rate.   Pulmonary/Chest: Effort normal.  Abdominal: Soft.  Musculoskeletal:  Unable to reproduce pain on exam.  Neurological: She is alert and oriented to person, place, and time.  Skin: Skin is warm and dry.  Psychiatric: She has a normal mood and affect.        Assessment & Plan:   Problem List Items Addressed This Visit     Unprioritized   Idiopathic thrombocytopenic purpura (ITP) - Primary     No free bleeding--plts are stable.    Essential hypertension     BP is good--could consider d/cing her HCTZ which would help with her potassium.    Fatigue   Relevant Orders      TSH (Completed)    Other Visit Diagnoses   Hypokalemia        Relevant  Medications       potassium chloride SA (K-DUR,KLOR-CON) CR tablet    Hair loss        Relevant Orders       TSH (Completed)    Primary osteoarthritis involving multiple joints        Relevant Medications       traMADol (ULTRAM) 50 MG tablet       Return in about 3 months (around 03/02/2014).

## 2013-11-30 NOTE — Patient Instructions (Signed)
Hypokalemia Hypokalemia means that the amount of potassium in the blood is lower than normal.Potassium is a chemical, called an electrolyte, that helps regulate the amount of fluid in the body. It also stimulates muscle contraction and helps nerves function properly.Most of the body's potassium is inside of cells, and only a very small amount is in the blood. Because the amount in the blood is so small, minor changes can be life-threatening. CAUSES  Antibiotics.  Diarrhea or vomiting.  Using laxatives too much, which can cause diarrhea.  Chronic kidney disease.  Water pills (diuretics). HCTZ  Eating disorders (bulimia).  Low magnesium level.  Sweating a lot. SIGNS AND SYMPTOMS  Weakness.  Constipation.  Fatigue.  Muscle cramps.  Mental confusion.  Skipped heartbeats or irregular heartbeat (palpitations).  Tingling or numbness. DIAGNOSIS  Your health care provider can diagnose hypokalemia with blood tests. In addition to checking your potassium level, your health care provider may also check other lab tests. TREATMENT Hypokalemia can be treated with potassium supplements taken by mouth or adjustments in your current medicines. If your potassium level is very low, you may need to get potassium through a vein (IV) and be monitored in the hospital. A diet high in potassium is also helpful. Foods high in potassium are:  Nuts, such as peanuts and pistachios.  Seeds, such as sunflower seeds and pumpkin seeds.  Peas, lentils, and lima beans.  Whole grain and bran cereals and breads.  Fresh fruit and vegetables, such as apricots, avocado, bananas, cantaloupe, kiwi, oranges, tomatoes, asparagus, and potatoes.  Orange and tomato juices.  Red meats.  Fruit yogurt. HOME CARE INSTRUCTIONS  Take all medicines as prescribed by your health care provider.  Maintain a healthy diet by including nutritious food, such as fruits, vegetables, nuts, whole grains, and lean  meats.  If you are taking a laxative, be sure to follow the directions on the label. SEEK MEDICAL CARE IF:  Your weakness gets worse.  You feel your heart pounding or racing.  You are vomiting or having diarrhea.  You are diabetic and having trouble keeping your blood glucose in the normal range. SEEK IMMEDIATE MEDICAL CARE IF:  You have chest pain, shortness of breath, or dizziness.  You are vomiting or having diarrhea for more than 2 days.  You faint. MAKE SURE YOU:   Understand these instructions.  Will watch your condition.  Will get help right away if you are not doing well or get worse. Document Released: 01/21/2005 Document Revised: 11/11/2012 Document Reviewed: 07/24/2012 [facility] Patient Information 2015 [place], [place]. This information is not intended to replace advice given to you by your health care provider. Make sure you discuss any questions you have with your health care provider. Arthritis, Nonspecific Arthritis is inflammation of a joint. This usually means pain, redness, warmth or swelling are present. One or more joints may be involved. There are a number of types of arthritis. Your caregiver may not be able to tell what type of arthritis you have right away. CAUSES  The most common cause of arthritis is the wear and tear on the joint (osteoarthritis). This causes damage to the cartilage, which can break down over time. The knees, hips, back and neck are most often affected by this type of arthritis. Other types of arthritis and common causes of joint pain include:  Sprains and other injuries near the joint. Sometimes minor sprains and injuries cause pain and swelling that develop hours later.  Rheumatoid arthritis. This affects hands, feet and  knees. It usually affects both sides of your body at the same time. It is often associated with chronic ailments, fever, weight loss and general weakness.  Crystal arthritis. Gout and pseudo gout can cause occasional  acute severe pain, redness and swelling in the foot, ankle, or knee.  Infectious arthritis. Bacteria can get into a joint through a break in overlying skin. This can cause infection of the joint. Bacteria and viruses can also spread through the blood and affect your joints.  Drug, infectious and allergy reactions. Sometimes joints can become mildly painful and slightly swollen with these types of illnesses. SYMPTOMS   Pain is the main symptom.  Your joint or joints can also be red, swollen and warm or hot to the touch.  You may have a fever with certain types of arthritis, or even feel overall ill.  The joint with arthritis will hurt with movement. Stiffness is present with some types of arthritis. DIAGNOSIS  Your caregiver will suspect arthritis based on your description of your symptoms and on your exam. Testing may be needed to find the type of arthritis:  Blood and sometimes urine tests.  X-ray tests and sometimes CT or MRI scans.  Removal of fluid from the joint (arthrocentesis) is done to check for bacteria, crystals or other causes. Your caregiver (or a specialist) will numb the area over the joint with a local anesthetic, and use a needle to remove joint fluid for examination. This procedure is only minimally uncomfortable.  Even with these tests, your caregiver may not be able to tell what kind of arthritis you have. Consultation with a specialist (rheumatologist) may be helpful. TREATMENT  Your caregiver will discuss with you treatment specific to your type of arthritis. If the specific type cannot be determined, then the following general recommendations may apply. Treatment of severe joint pain includes:  Rest.  Elevation.  Anti-inflammatory medication (for example, ibuprofen) may be prescribed. Avoiding activities that cause increased pain.  Only take over-the-counter or prescription medicines for pain and discomfort as recommended by your caregiver.  Cold packs over  an inflamed joint may be used for 10 to 15 minutes every hour. Hot packs sometimes feel better, but do not use overnight. Do not use hot packs if you are diabetic without your caregiver's permission.  A cortisone shot into arthritic joints may help reduce pain and swelling.  Any acute arthritis that gets worse over the next 1 to 2 days needs to be looked at to be sure there is no joint infection. Long-term arthritis treatment involves modifying activities and lifestyle to reduce joint stress jarring. This can include weight loss. Also, exercise is needed to nourish the joint cartilage and remove waste. This helps keep the muscles around the joint strong. HOME CARE INSTRUCTIONS   Do not take aspirin to relieve pain if gout is suspected. This elevates uric acid levels.  Only take over-the-counter or prescription medicines for pain, discomfort or fever as directed by your caregiver.  Rest the joint as much as possible.  If your joint is swollen, keep it elevated.  Use crutches if the painful joint is in your leg.  Drinking plenty of fluids may help for certain types of arthritis.  Follow your caregiver's dietary instructions.  Try low-impact exercise such as:  Swimming.  Water aerobics.  Biking.  Walking.  Morning stiffness is often relieved by a warm shower.  Put your joints through regular range-of-motion. SEEK MEDICAL CARE IF:   You do not feel better in  24 hours or are getting worse.  You have side effects to medications, or are not getting better with treatment. SEEK IMMEDIATE MEDICAL CARE IF:   You have a fever.  You develop severe joint pain, swelling or redness.  Many joints are involved and become painful and swollen.  There is severe back pain and/or leg weakness.  You have loss of bowel or bladder control. Document Released: 02/29/2004 Document Revised: 04/15/2011 Document Reviewed: 03/16/2008 [facility] Patient Information 2015 [place], [place]. This  information is not intended to replace advice given to you by your health care provider. Make sure you discuss any questions you have with your health care provider.

## 2013-11-30 NOTE — Assessment & Plan Note (Signed)
No free bleeding--plts are stable.

## 2013-12-01 NOTE — Assessment & Plan Note (Signed)
BP is good--could consider d/cing her HCTZ which would help with her potassium.

## 2013-12-06 ENCOUNTER — Telehealth: Payer: Self-pay | Admitting: Family Medicine

## 2013-12-06 NOTE — Telephone Encounter (Signed)
Seen by Dr. Kennon Rounds on 10/27. Had lab work done, would like to know her results. Please call patient.

## 2013-12-06 NOTE — Telephone Encounter (Signed)
Called patient and informed her of normal TSH. Jazmin Hartsell,CMA

## 2014-01-04 ENCOUNTER — Encounter: Payer: Self-pay | Admitting: Family Medicine

## 2014-01-04 ENCOUNTER — Ambulatory Visit (INDEPENDENT_AMBULATORY_CARE_PROVIDER_SITE_OTHER): Payer: BC Managed Care – PPO | Admitting: Family Medicine

## 2014-01-04 VITALS — BP 144/82 | HR 76 | Temp 98.3°F | Resp 18 | Wt 201.0 lb

## 2014-01-04 DIAGNOSIS — M25551 Pain in right hip: Secondary | ICD-10-CM | POA: Diagnosis not present

## 2014-01-04 DIAGNOSIS — I10 Essential (primary) hypertension: Secondary | ICD-10-CM

## 2014-01-04 DIAGNOSIS — M150 Primary generalized (osteo)arthritis: Secondary | ICD-10-CM | POA: Diagnosis not present

## 2014-01-04 DIAGNOSIS — M159 Polyosteoarthritis, unspecified: Secondary | ICD-10-CM

## 2014-01-04 DIAGNOSIS — M25559 Pain in unspecified hip: Secondary | ICD-10-CM

## 2014-01-04 DIAGNOSIS — M25552 Pain in left hip: Secondary | ICD-10-CM

## 2014-01-04 MED ORDER — AMLODIPINE BESYLATE 5 MG PO TABS: 5.0000 mg | ORAL_TABLET | Freq: Every day | ORAL | Status: DC

## 2014-01-04 MED ORDER — TRAMADOL HCL 50 MG PO TABS: 50.0000 mg | ORAL_TABLET | Freq: Two times a day (BID) | ORAL | Status: DC | PRN

## 2014-01-04 MED ORDER — CARVEDILOL 25 MG PO TABS: 25.0000 mg | ORAL_TABLET | Freq: Two times a day (BID) | ORAL | Status: DC

## 2014-01-04 MED ORDER — METFORMIN HCL ER 500 MG PO TB24: ORAL_TABLET | ORAL | Status: DC

## 2014-01-04 NOTE — Progress Notes (Signed)
Patient ID: [name], female   DOB: 08/29/1960, 53 y.o.   MRN: [id number] Subjective:   CC: follow up hypertension and discuss hip pain.  HPI:   Hypertension Patient is taking medications as prescribed (norvasc, HCTZ, carvedilol). She is worried HCTZ may be bringing down her potassium. She is not exercising due to hip pain (before that, was walking) and is adherent to a low-salt diet. Blood pressure is well controlled at home (120s/70s-130/78). Cardiac symptoms: none. Patient denies chest pain, dyspnea, exertional chest pressure/discomfort, fatigue, irregular heart beat, orthopnea, palpitations, paroxysmal nocturnal dyspnea, syncope and tachypnea. Cardiovascular risk factors: dyslipidemia, hypertension, obesity (BMI >= 30 kg/m2) and sedentary lifestyle. Use of agents associated with hypertension: tamoxifen. History of target organ damage: none.  Hip pain Present 3-4 weeks, dull ache bilaterally that seems to go from one hip to the other. Tender to the touch, Like a deep burning. Achy at night Tramadol helps. Takes once daily. Using bengay which helps. Able to walk on it, but limps and it gets stiff Worse in AM when wakes up, feels better when starts moving, about 20 minutes later Never red or swollen; no fevers/chills. No h/o trauma. No numbness/tingling down leg BM biopsy in the past in that area.   Review of Systems - Per HPI.   PMH - transaminitis, prediabetes, ITP, hyperparathyroidism, HLD, HTN, bilateral LE edema, cancer central right breast Since last visit, had left breast reduction surgery that she is pleased with. Medications - reviewed Smoking status: Nonsmoker    Objective:  Physical Exam BP 144/82 mmHg  Pulse 76  Temp(Src) 98.3 F (36.8 C) (Oral)  Resp 18  Wt 201 lb (91.173 kg)  SpO2 96% GEN: NAD CV : RRR, no m/r/g, 2+ b radial pulses PULM: CTAB EXTR: No LE edema Hip flexion right 4/5 with reproduced pain Good internal/external rotation of hip Normal  gait Tender bilateral gluteus medius/posterior iliac crest; no erythema or deformity    Assessment:     [name] is a 53 y.o. female here for f/u HTN and with hip pain.    Plan:     # See problem list and after visit summary for problem-specific plans.   # Health Maintenance: needs f/u HLD and DM in 3 mo.  Follow-up: Follow up in 1 month for f/u HTN.    Hilton Sinclair, MD [place]

## 2014-01-04 NOTE — Patient Instructions (Signed)
Good to see you!  For your BP: Stop HCTZ and check BP at home. Call with 3 values in 1 week. Come back in 1 month and we can recheck BP and your labs.  For your hip pain, this is most likely arthritis. Stay active. Go to PT. IF you don't get a call from them, let us know next week. Work on losing weight.  Take tylenol as needed, and tramadol for severe pain. Get xrays. I will call if any are NOT normal.  Follow up to talk about cholesterol and prediabetes in 3 months.  Best,  Hilton Sinclair, MD

## 2014-01-05 ENCOUNTER — Ambulatory Visit (HOSPITAL_COMMUNITY)
Admission: RE | Admit: 2014-01-05 | Discharge: 2014-01-05 | Disposition: A | Discharge status: Home / Self Care | Payer: BC Managed Care – PPO | Source: Ambulatory Visit | Attending: Family Medicine | Admitting: Family Medicine

## 2014-01-05 DIAGNOSIS — M5136 Other intervertebral disc degeneration, lumbar region: Secondary | ICD-10-CM | POA: Diagnosis not present

## 2014-01-05 DIAGNOSIS — M25559 Pain in unspecified hip: Secondary | ICD-10-CM | POA: Diagnosis not present

## 2014-01-05 DIAGNOSIS — M47817 Spondylosis without myelopathy or radiculopathy, lumbosacral region: Secondary | ICD-10-CM | POA: Diagnosis not present

## 2014-01-05 DIAGNOSIS — M160 Bilateral primary osteoarthritis of hip: Secondary | ICD-10-CM | POA: Insufficient documentation

## 2014-01-05 DIAGNOSIS — M25552 Pain in left hip: Secondary | ICD-10-CM | POA: Diagnosis present

## 2014-01-05 DIAGNOSIS — M25551 Pain in right hip: Secondary | ICD-10-CM | POA: Diagnosis present

## 2014-01-05 DIAGNOSIS — M545 Low back pain: Secondary | ICD-10-CM | POA: Diagnosis present

## 2014-01-07 DIAGNOSIS — M25559 Pain in unspecified hip: Secondary | ICD-10-CM | POA: Insufficient documentation

## 2014-01-07 NOTE — Assessment & Plan Note (Signed)
Likely arthritis with no injury and h/o morning stiffness improving after 20 minutes. Also consider muscular strain though less likely given bilateral location. - Bilateral hip and lumbar xrays. - Exercises reviewed to strengthen surrounding muscles and low back. Referral placed to PT. - Tylenol PRN; tramadol prn severe pain; refilled - work on weight loss.

## 2014-01-07 NOTE — Assessment & Plan Note (Signed)
Stable BP off HCTZ. Symptoms of dizziness have improved since stopping this and supplementing K. Nl Cr 11/2013. - Stay of HCTZ. Refilled others. - Call with 3 values in 1 week to evaluate control at home. - F/u in 3 months; at that time, check K.

## 2014-01-10 ENCOUNTER — Telehealth: Payer: Self-pay | Admitting: Family Medicine

## 2014-01-10 NOTE — Telephone Encounter (Signed)
Please let Ms Montesano know that her hip and lumbar spine xrays showed no acute abnormality but did show diffuse arthritic changes in both hips and lumbar spine which explain some of her pain. This is very common. The first and main treatment for this is PT and weight loss. Other options if this does not help are medications, steroid injections, and surgery. The last two listed are if PT, weight loss, and oral medications do not help.  Thanks.  Hilton Sinclair, MD

## 2014-01-10 NOTE — Telephone Encounter (Signed)
Requesting xray results/eo

## 2014-01-10 NOTE — Telephone Encounter (Signed)
LM with female who answered the phone to have pt call back, please inform of the below. Fleeger, Salome Spotted

## 2014-01-11 NOTE — Telephone Encounter (Signed)
Message delivered

## 2014-01-13 ENCOUNTER — Telehealth: Payer: Self-pay | Admitting: Family Medicine

## 2014-01-13 NOTE — Telephone Encounter (Signed)
Pt called and wanted to know or when the physical therapist was going to call and schedule an appointment. jw

## 2014-01-13 NOTE — Telephone Encounter (Signed)
Pt is aware that they have been in contact.  I gave her the number to contact.  Loney Peto,CMA

## 2014-01-31 ENCOUNTER — Telehealth: Payer: Self-pay | Admitting: Family Medicine

## 2014-01-31 NOTE — Telephone Encounter (Signed)
Resent referral and placed in PT workqueue. Kysa Calais,CMA

## 2014-01-31 NOTE — Telephone Encounter (Signed)
Pt called because she said the [facility] said that they still have not received a referral from [place] for her to be seen. jw

## 2014-02-08 ENCOUNTER — Ambulatory Visit: Payer: BLUE CROSS/BLUE SHIELD | Attending: Family Medicine | Admitting: Physical Therapy

## 2014-02-08 DIAGNOSIS — M1990 Unspecified osteoarthritis, unspecified site: Secondary | ICD-10-CM | POA: Diagnosis not present

## 2014-02-08 DIAGNOSIS — M25559 Pain in unspecified hip: Secondary | ICD-10-CM | POA: Insufficient documentation

## 2014-02-08 NOTE — Patient Instructions (Signed)
Abduction: Clam (Eccentric) - Side-Lying   Lie on side with knees bent. Lift top knee, keeping feet together. Keep trunk steady. Slowly lower for 3-5 seconds. __10_ reps per set, _2__ sets per day, ___7 days per week. Add _1.__ lbs when you achieve _25__ repetitions.  Copyright  VHI. All rights reserved.

## 2014-02-08 NOTE — Therapy (Addendum)
[place], [place], [address] Phone: [phone]   Fax:  [phone]  Physical Therapy Evaluation  Patient Details  Name: [name] MRN: [id number] Date of Birth: 1960-10-13  Encounter Date: 02/08/2014      PT End of Session - 02/08/14 1816    Visit Number 1   Number of Visits 16   Date for PT Re-Evaluation 04/05/14   PT Start Time 0845   PT Stop Time 0933   PT Time Calculation (min) 48 min   Activity Tolerance Patient limited by pain      Past Medical History  Diagnosis Date  . Hypertension   . Hyperlipidemia   . Thrombocytopenia   . Diabetes mellitus   . Arthritis   . Breast CA     breast CA- right breast  . Thyroid disease     parathyroid removed  . Heel spur     bilateral    Past Surgical History  Procedure Laterality Date  . Breast surgery  09/12/10    excision of skin mass - right breast, had another surgery to remove more Cancer cells  . Parathyroidectomy      partial?  . Abdominal hysterectomy      1996 per pt  . Inner ear surgery  06/01/13    There were no vitals taken for this visit.  Visit Diagnosis:  Hip pain, unspecified laterality      Subjective Assessment - 02/08/14 0849    Symptoms Had x-rays of back and hips with arthritis.  Groin area as well as in buttocks sometimes;  Present 3 months comes and goes.  No injury.  Worse in the mornings, after lying doing/getting back up.  Sits down to do her housework.     Pertinent History Pre-diabetes; breast CA survivor   Limitations Standing;Walking;House hold activities   How long can you sit comfortably? unlimited   How long can you stand comfortably? 15 min   How long can you walk comfortably? 10 min   Diagnostic tests x-rays   Patient Stated Goals get the pain down so I can walk   Currently in Pain? Yes   Pain Score 5    Pain Location Hip   Pain Orientation Right;Left   Pain Descriptors / Indicators Dull   Pain  Type Chronic pain   Pain Onset More than a month ago   Pain Frequency Intermittent   Aggravating Factors  rising after sitting/lying;  standing/walking   Pain Relieving Factors Tylenol;  last resort takes Tramadol;  heat helps somewhat          [facility] PT Assessment - 02/08/14 0855    Assessment   Medical Diagnosis --  bilateral hip pain   Onset Date --  3 months   Next MD Visit --  this Friday   Prior Therapy --  no   Precautions   Precautions Other (comment)  bad response topical med; low platelets (bruises easily);   Restrictions   Weight Bearing Restrictions No   Balance Screen   Has the patient fallen in the past 6 months No   Has the patient had a decrease in activity level because of a fear of falling?  No   Is the patient reluctant to leave their home because of a fear of falling?  No   Home Environment   Living Enviornment Private residence   Type of [place] to enter   Home Layout One level  Additional Comments --  no problem   Prior Function   Level of Independence Independent with basic ADLs   Leisure --  Garden/plant flowers   Observation/Other Assessments   Other Surveys  --  Recommend LEFS next visit   Posture/Postural Control   Posture Comments --  Tenderness bilateral gluteals and left G.trochanteric bursa   AROM   Right Hip Extension 5   Right Hip Flexion 110   Right Hip External Rotation  35   Right Hip Internal Rotation  10   Left Hip Extension 0   Left Hip Flexion 105   Left Hip External Rotation  35   Left Hip Internal Rotation  10   Strength   Right Hip Flexion 4/5   Right Hip ABduction 3-/5   Left Hip Flexion 4/5   Left Hip ABduction 3-/5   Right Knee Flexion 4/5   Right Knee Extension 4/5   Left Knee Flexion 4/5   Left Knee Extension 4/5   Flexibility   Hamstrings --  Left 80,right 75   Special Tests    Special Tests Hip Special Tests   Saralyn Pilar (FABER) Test   Findings Positive   Side Left   SI  Distraction   Findings  Negative   Side Right;Left   Hip Scouring   Findings Positive   Side Left                          PT Education - 02/08/14 1816    Education provided Yes   Education Details S/L Clams; supine HS stretch with strap   Person(s) Educated Patient   Methods Explanation;Demonstration;Verbal cues;Handout   Comprehension Verbalized understanding;Returned demonstration;Verbal cues required          PT Short Term Goals - 02/08/14 1826    PT SHORT TERM GOAL #1   Title "Independent with initial HEP   Time 4   Period Weeks   Status New   PT SHORT TERM GOAL #2   Title Left hip flexion ROM improved to 110 degrees needed for greater ease with getting in/out of bed, sit to stand from standard chairs.   Time 4   Period Weeks   Status New   PT SHORT TERM GOAL #3   Title Overall pain with home activities improved by 25%.   Time 4   Period Weeks   Status New           PT Long Term Goals - 02/08/14 1828    PT LONG TERM GOAL #1   Title "Pt will be independent with advanced HEP   Time 8   Period Weeks   Status New   PT LONG TERM GOAL #2   Title Patient will have improved bilateral hip abd strength to 3+/5 needed for prolonged standing and walking with less pain   Time 8   Period Weeks   Status New   PT LONG TERM GOAL #3   Title Patient will have improved left hip flexor length/hip extension ROM to 10 degrees needed for walking community distances with less pain   Time 8   Period Weeks   Status New   PT LONG TERM GOAL #4   Title Overall pain and function with household chores improved by > 50% per patient report.   Time 8   Period Weeks   Status New               Plan - 02/08/14 1818  Clinical Impression Statement The patient is a pleasant 54 year old with a > 3 month history of bilateral (left > right ) hip pain.  Symptoms are intermittent and variable.  Sometimes pain is in the groin area but can also be in left buttock  region.  She has decreased bilateral hip AROM:  left hip flexion 105, left hip flex 110.  SLR 75-80 degrees.  Pain with left hip scour at 11:00 and FABER.  Decreased hip flexor length bilaterally left more than right.  Hip abduction strength 3-/5 bilaterally and painful with resistance.  Patient has multiple co-morbidities which may slow progress include history of breast CA, diabetes, low platelets which could cause bruising and a poor response to a previously used transdermal medicine use for plantar fascitis.  Patient to discuss with her doctors regarding what this medication was (considering iontophoresis with dexamethasone) and discuss with her doctor dry needling intervention which may cause bruising.     Pt will benefit from skilled therapeutic intervention in order to improve on the following deficits Pain;Decreased strength;Decreased range of motion   Rehab Potential Good   PT Frequency 2x / week   PT Duration 8 weeks   PT Treatment/Interventions ADLs/Self Care Home Management;Cryotherapy;Electrical Stimulation;Moist Heat;Therapeutic activities;Therapeutic exercise;Neuromuscular re-education;Patient/family education;Manual techniques;Dry needling;Other (comment)   PT Next Visit Plan Do LEFS next visit;  Assess response to HEP (clams), HS stretch with strap in supine, hip flexor stretching, manual techniques (hip joint mobs); moist heat, possible e-stim?          G-Codes - 2014/02/20 1833    Functional Assessment Tool Used clinical judgement   Functional Limitation Mobility: Walking and moving around   Mobility: Walking and Moving Around Current Status [phone]) At least 40 percent but less than 60 percent impaired, limited or restricted   Mobility: Walking and Moving Around Goal Status [phone]) At least 20 percent but less than 40 percent impaired, limited or restricted       Problem List Patient Active Problem List   Diagnosis Date Noted  . Hip pain, bilateral 01/07/2014  . 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 Transaminitis 11/13/2012  . Abdominal spasms 05/01/2012  . Personal history of colonic polyps 04/17/2012  . Bilateral foot pain 02/21/2012  . Fatigue 11/12/2011  . Health care maintenance 11/12/2011  . Plantar fasciitis 06/12/2011  . Hyperlipidemia 04/04/2011  . Bilateral lower extremity edema 04/04/2011  . Prediabetes 06/07/2010  . Cancer of central portion of right female breast 10/04/2009  . Idiopathic thrombocytopenic purpura (ITP) 06/22/2009  . Essential hypertension 06/22/2009  . HYPERPARATHYROIDISM, HX OF 06/22/2009  . Avitaminosis D 09/01/2008    Alvera Singh 02-20-14, 6:39 PM  [place], [place], [address] Phone: [phone]   Fax:  [phone]  Ruben Im, [place] 02-20-2014 6:40 PM Phone: [phone] Fax: [phone]  PHYSICAL THERAPY DISCHARGE SUMMARY  Visits from Start of Care: 1  Current functional level related to goals / functional outcomes: See initial summary   Remaining deficits: Patient did not return after initial evaluation and called to cancel follow up appointments with reason unknown.    Attempted to call patient back and left message with another person.     Plan: Patient agrees to discharge.  Patient goals were not met. Patient is being discharged due to the patient's request.  ?????   Ruben Im, PT 02/22/2014 10:24 AM Phone: [phone] Fax: [phone]

## 2014-02-09 ENCOUNTER — Other Ambulatory Visit: Payer: Self-pay | Admitting: *Deleted

## 2014-02-09 ENCOUNTER — Telehealth: Payer: Self-pay | Admitting: *Deleted

## 2014-02-09 DIAGNOSIS — C50919 Malignant neoplasm of unspecified site of unspecified female breast: Secondary | ICD-10-CM

## 2014-02-09 MED ORDER — TAMOXIFEN CITRATE 20 MG PO TABS: ORAL_TABLET | ORAL | Status: DC

## 2014-02-09 MED ORDER — VENLAFAXINE HCL ER 37.5 MG PO CP24: 37.5000 mg | ORAL_CAPSULE | Freq: Every day | ORAL | Status: DC

## 2014-02-09 NOTE — Telephone Encounter (Signed)
Pt left VM yesterday afternoon states needs a medication refill.  She did not specify which medication.  She is a pt of Dr. Lindi Adie.  Left VM for Dr. Geralyn Flash nurse this morning to please contact pt today regarding refill.

## 2014-02-10 NOTE — Addendum Note (Signed)
Addended by: Alvera Singh on: 02/10/2014 07:16 AM   Modules accepted: Orders

## 2014-02-11 ENCOUNTER — Ambulatory Visit (INDEPENDENT_AMBULATORY_CARE_PROVIDER_SITE_OTHER): Payer: BLUE CROSS/BLUE SHIELD | Admitting: Family Medicine

## 2014-02-11 ENCOUNTER — Encounter: Payer: Self-pay | Admitting: Family Medicine

## 2014-02-11 VITALS — BP 149/82 | HR 88 | Temp 98.2°F | Ht 66.0 in | Wt 200.8 lb

## 2014-02-11 DIAGNOSIS — E785 Hyperlipidemia, unspecified: Secondary | ICD-10-CM

## 2014-02-11 DIAGNOSIS — R739 Hyperglycemia, unspecified: Secondary | ICD-10-CM | POA: Diagnosis not present

## 2014-02-11 DIAGNOSIS — R7303 Prediabetes: Secondary | ICD-10-CM

## 2014-02-11 DIAGNOSIS — R7401 Elevation of levels of liver transaminase levels: Secondary | ICD-10-CM

## 2014-02-11 DIAGNOSIS — R7309 Other abnormal glucose: Secondary | ICD-10-CM

## 2014-02-11 DIAGNOSIS — E876 Hypokalemia: Secondary | ICD-10-CM | POA: Diagnosis not present

## 2014-02-11 DIAGNOSIS — R740 Nonspecific elevation of levels of transaminase and lactic acid dehydrogenase [LDH]: Secondary | ICD-10-CM

## 2014-02-11 DIAGNOSIS — M25552 Pain in left hip: Secondary | ICD-10-CM

## 2014-02-11 DIAGNOSIS — R600 Localized edema: Secondary | ICD-10-CM

## 2014-02-11 DIAGNOSIS — I10 Essential (primary) hypertension: Secondary | ICD-10-CM | POA: Diagnosis not present

## 2014-02-11 DIAGNOSIS — M25551 Pain in right hip: Secondary | ICD-10-CM

## 2014-02-11 LAB — POCT GLYCOSYLATED HEMOGLOBIN (HGB A1C): HEMOGLOBIN A1C: 6.1

## 2014-02-11 MED ORDER — POTASSIUM CHLORIDE CRYS ER 20 MEQ PO TBCR: 40.0000 meq | EXTENDED_RELEASE_TABLET | Freq: Every day | ORAL | Status: DC

## 2014-02-11 MED ORDER — AMLODIPINE BESYLATE 5 MG PO TABS: 5.0000 mg | ORAL_TABLET | Freq: Every day | ORAL | Status: DC

## 2014-02-11 MED ORDER — CARVEDILOL 25 MG PO TABS: 25.0000 mg | ORAL_TABLET | Freq: Two times a day (BID) | ORAL | Status: DC

## 2014-02-11 MED ORDER — HYDROCHLOROTHIAZIDE 25 MG PO TABS: ORAL_TABLET | ORAL | Status: DC

## 2014-02-11 NOTE — Patient Instructions (Signed)
Good to see you!  We are getting labs today and I will call if any are NOT normal.  I have refilled your medications.  Increase your walking and keep salt intake low. Call me with BP measurements in 2 weeks (3-5 measurements).  I have ordered an echocardiogram. You should get called about scheduling it.  Follow up with me in 3 months.  Best,  Hilton Sinclair, MD

## 2014-02-11 NOTE — Progress Notes (Signed)
Patient ID: [name], female   DOB: 1960-12-17, 54 y.o.   MRN: [id number] Subjective:   CC: follow up hypertension and discuss hip pain.  HPI:   HTN Last visit, BP was stable off HCTZ and dizziness had improved. Continued amlodipine and carvedilol and pt was to check 3 values at home, which has been 150s/80s. Since coming off HCTZ, thinks swelling all over has gotten worse. She feels like she mostly carries fluid in her legs. Has been taking carvedilol and amlodipine regularly with no chest pain or dyspnea. Having headaches at home. Has lots of pain from hip and thinks this contributes. Friend recently died so it has been difficult time. She still walks 30 minutes twice weekly and has been trying to watch salt intake.  Cholesterol Was last checked 11/2012. Not currently on a statin.  Prediabetes A1c Feb 2015 6.1.   Hip pain Still present, recently seen by physician who recommended needling and iontophoresis/dexamethasone.    Review of Systems - Per HPI.   PMH - transaminitis, prediabetes, ITP, hyperparathyroidism, HLD, HTN, bilateral LE edema, cancer central right breast Since last visit, had left breast reduction surgery that she is pleased with. Medications - reviewed Smoking status: Nonsmoker    Objective:  Physical Exam BP 149/82 mmHg  Pulse 88  Temp(Src) 98.2 F (36.8 C) (Oral)  Ht 5\' 6"  (1.676 m)  Wt 200 lb 12.8 oz (91.082 kg)  BMI 32.43 kg/m2 GEN: NAD CV : RRR, no m/r/g, 2+ b radial pulses PULM: CTAB EXTR: No LE edema NEURO: Awake, alert, no focal deficit, Normal gait    Assessment:     [name] is a 54 y.o. female here for f/u HTN and with hip pain.    Plan:     # See problem list and after visit summary for problem-specific plans.   # Health Maintenance: Not discussed.  Follow-up: Follow up in 3 month for f/u HTN.   Hilton Sinclair, MD [place]

## 2014-02-11 NOTE — Assessment & Plan Note (Addendum)
BP not well controlled on carvedilol and amlodipine - Refilled amlodipine and carvedilol. Restart HCTZ, also filled. - Discussed increasing walking and cutting back on salt.  - Call in 2 weeks with BP measurements at home. F/u in 3 months. - CMET today. - Will order echocardiogram with persistent LE edema (subjectively). Scheduled 1/14 at 9AM - F/u 3 mo.

## 2014-02-12 LAB — LIPID PANEL
Cholesterol: 218 mg/dL — ABNORMAL HIGH (ref 0–200)
HDL: 54 mg/dL (ref >39)
LDL Cholesterol: 97 mg/dL (ref 0–99)
Total CHOL/HDL Ratio: 4 Ratio
Triglycerides: 334 mg/dL — ABNORMAL HIGH (ref <150)
VLDL: 67 mg/dL — ABNORMAL HIGH (ref 0–40)

## 2014-02-12 LAB — COMPREHENSIVE METABOLIC PANEL
ALT: 30 U/L (ref 0–35)
AST: 38 U/L — ABNORMAL HIGH (ref 0–37)
Albumin: 3.7 g/dL (ref 3.5–5.2)
Alkaline Phosphatase: 47 U/L (ref 39–117)
BUN: 8 mg/dL (ref 6–23)
CALCIUM: 9.3 mg/dL (ref 8.4–10.5)
CO2: 28 meq/L (ref 19–32)
CREATININE: 0.72 mg/dL (ref 0.50–1.10)
Chloride: 101 mEq/L (ref 96–112)
Glucose, Bld: 116 mg/dL — ABNORMAL HIGH (ref 70–99)
POTASSIUM: 4.4 meq/L (ref 3.5–5.3)
Sodium: 138 mEq/L (ref 135–145)
Total Bilirubin: 0.3 mg/dL (ref 0.2–1.2)
Total Protein: 6.8 g/dL (ref 6.0–8.3)

## 2014-02-14 ENCOUNTER — Telehealth: Payer: Self-pay | Admitting: *Deleted

## 2014-02-14 NOTE — Telephone Encounter (Signed)
per pt to cancel all her appts. she stated that she will not be coming back...td

## 2014-02-15 ENCOUNTER — Ambulatory Visit: Payer: BLUE CROSS/BLUE SHIELD | Admitting: Rehabilitation

## 2014-02-16 NOTE — Assessment & Plan Note (Signed)
Last checked 11/2012. - lipid panel today

## 2014-02-16 NOTE — Assessment & Plan Note (Signed)
>>  ASSESSMENT AND PLAN FOR DM (DIABETES MELLITUS) WITH COMPLICATIONS (HCC) WRITTEN ON 02/16/2014 10:31 PM BY CURTIS IP T, MD  A1c Feb 2015 was 6.1, prediabetic. - A1c today--> remains 6.1. Continue metformin  and work on weight loss.

## 2014-02-16 NOTE — Assessment & Plan Note (Signed)
Daily. Considering needling and iontophoresis / dexamethasonne. - Continue PT, weight loss, and okay to do needling if not deep (low PLT). Stop at any sgign of bleeding. - Will review if OK to do iontophoresis and dexamethasone.

## 2014-02-16 NOTE — Assessment & Plan Note (Signed)
A1c Feb 2015 was 6.1, prediabetic. - A1c today--> remains 6.1. Continue metformin and work on weight loss.

## 2014-02-17 ENCOUNTER — Telehealth: Payer: Self-pay | Admitting: Family Medicine

## 2014-02-17 ENCOUNTER — Encounter: Payer: Self-pay | Admitting: Family Medicine

## 2014-02-17 ENCOUNTER — Ambulatory Visit (HOSPITAL_COMMUNITY)
Admission: RE | Admit: 2014-02-17 | Discharge: 2014-02-17 | Disposition: A | Discharge status: Home / Self Care | Payer: BLUE CROSS/BLUE SHIELD | Source: Ambulatory Visit | Attending: Family Medicine | Admitting: Family Medicine

## 2014-02-17 DIAGNOSIS — R609 Edema, unspecified: Secondary | ICD-10-CM | POA: Diagnosis not present

## 2014-02-17 DIAGNOSIS — E119 Type 2 diabetes mellitus without complications: Secondary | ICD-10-CM | POA: Insufficient documentation

## 2014-02-17 DIAGNOSIS — I10 Essential (primary) hypertension: Secondary | ICD-10-CM

## 2014-02-17 DIAGNOSIS — I059 Rheumatic mitral valve disease, unspecified: Secondary | ICD-10-CM | POA: Diagnosis not present

## 2014-02-17 DIAGNOSIS — R600 Localized edema: Secondary | ICD-10-CM

## 2014-02-17 NOTE — Progress Notes (Signed)
Echocardiogram 2D Echocardiogram has been performed.  [name] 02/17/2014, 9:00 AM

## 2014-02-17 NOTE — Telephone Encounter (Signed)
Pt is aware of results and would like to start this new medication.  She also wanted to make you aware that she did have her echo done today. Jazmin Hartsell,CMA

## 2014-02-17 NOTE — Telephone Encounter (Signed)
Please let Ms Colaizzi know:   CMET was unremarkable and stable from last check.  Cholesterol numbers are high enough to recommend a medication (atorvastatin or rosouvastatin). If she is amenable, I will send one in and if her insurance does not cover it, she should let me know and I will send a different one. She should take daily and we can recheck cholesterol in 1 year. She should keep extra sugar to a minimum. Side effects to look out for - severe new muscle pain or swelling.   Hilton Sinclair, MD

## 2014-02-18 ENCOUNTER — Ambulatory Visit: Payer: BLUE CROSS/BLUE SHIELD | Admitting: Physical Therapy

## 2014-02-18 ENCOUNTER — Encounter: Payer: Self-pay | Admitting: Family Medicine

## 2014-02-18 DIAGNOSIS — I340 Nonrheumatic mitral (valve) insufficiency: Secondary | ICD-10-CM

## 2014-02-18 MED ORDER — ATORVASTATIN CALCIUM 40 MG PO TABS: 40.0000 mg | ORAL_TABLET | Freq: Every day | ORAL | Status: DC

## 2014-02-18 NOTE — Addendum Note (Signed)
Addended by: Conni Slipper T on: 02/18/2014 02:50 PM   Modules accepted: Orders

## 2014-02-18 NOTE — Telephone Encounter (Addendum)
Rx sent for atorvastatin 40mg  daily.  Hilton Sinclair, MD

## 2014-02-21 ENCOUNTER — Telehealth: Payer: Self-pay | Admitting: Family Medicine

## 2014-02-21 DIAGNOSIS — E785 Hyperlipidemia, unspecified: Secondary | ICD-10-CM

## 2014-02-21 MED ORDER — ATORVASTATIN CALCIUM 40 MG PO TABS: 40.0000 mg | ORAL_TABLET | Freq: Every day | ORAL | Status: DC

## 2014-02-21 NOTE — Telephone Encounter (Signed)
Pt called because she would like to know what her test results were and also needs a refill on her cholesterol medication called in. jw

## 2014-02-21 NOTE — Telephone Encounter (Signed)
Resent cholesterol rx due to pt stating pharmacy had not received.  Also discussed echocardiogram findings with patient. Let her know a letter had been sent on 14th of Jan and should be reaching her soon.  Danielle Sinclair, MD

## 2014-02-22 ENCOUNTER — Encounter: Payer: Medicare Other | Admitting: Rehabilitation

## 2014-02-25 ENCOUNTER — Encounter: Payer: Medicare Other | Admitting: Physical Therapy

## 2014-03-16 ENCOUNTER — Ambulatory Visit (INDEPENDENT_AMBULATORY_CARE_PROVIDER_SITE_OTHER): Payer: BLUE CROSS/BLUE SHIELD | Admitting: Family Medicine

## 2014-03-16 VITALS — BP 125/85 | HR 91 | Temp 98.2°F | Wt 194.6 lb

## 2014-03-16 DIAGNOSIS — R000 Tachycardia, unspecified: Secondary | ICD-10-CM | POA: Insufficient documentation

## 2014-03-16 NOTE — Patient Instructions (Addendum)
Thank you for coming to the clinic today. It was nice seeing you.  For your elevated heart rate, it is probably due to you being off your carvedilol for 2 days. Since your blood pressure has been fairly well controlled, it is ok to stop taking this medication. It will take a few days to a week or so for your heart rate to return to normal.   Dehydration can also cause elevated heart rate, so please make sure that you drink plenty of fluids. We will check your thyroid and blood counts today. If they are abnormal we will let you know.  We would like to see you back in 2 weeks for a blood pressure check.

## 2014-03-16 NOTE — Progress Notes (Signed)
[name] is a 54 y.o. female who presents to the [facility] today with a chief complaint of elevated heart rate.   HPI:  Tachycardia. Symptoms started 2 days ago in setting of running out of carvedilol. Has a machine at home that checks her blood pressure and has noted that her heart rate has been elevated to 110s for the past couple days. Elevated heart rate is associated with some pre-syncopal feelings, though no actual syncope. No palpitations. No nausea or vomiting. No chest pain. No fevers or recent illnesses. Some fatigue for the past month. Also noted increased dryness in skin and hair. Some diarrhea associated with taking metformin. Reports good fluid intake.   Says that her symptoms occur intermittently and last only for a few minutes at a time. Has had 2-3 episodes per day. Lays down when symptoms occur, which helps. Symptoms also noted to occur shortly after taking her medications.   Of note, has been skipping evening dose of carvedilol for the past several months.   ROS: As per HPI  Past Medical History - Reviewed and updated Patient Active Problem List   Diagnosis Date Noted  . Tachycardia 03/16/2014  . Mitral regurgitation 02/18/2014  . Hip pain, bilateral 01/07/2014  . 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 Transaminitis 11/13/2012  . Abdominal spasms 05/01/2012  . Personal history of colonic polyps 04/17/2012  . Bilateral foot pain 02/21/2012  . Fatigue 11/12/2011  . Health care maintenance 11/12/2011  . Plantar fasciitis 06/12/2011  . Hyperlipidemia 04/04/2011  . Bilateral lower extremity edema 04/04/2011  . Prediabetes 06/07/2010  . Cancer of central portion of right female breast 10/04/2009  . Idiopathic thrombocytopenic purpura (ITP) 06/22/2009  . Essential hypertension 06/22/2009  . HYPERPARATHYROIDISM, HX OF 06/22/2009  . Avitaminosis D 09/01/2008    Medications- reviewed and updated Current Outpatient Prescriptions  Medication Sig Dispense Refill  .  Acetaminophen 650 MG TABS Take 1 tablet (650 mg total) by mouth 3 (three) times daily as needed. 30 tablet 0  . amLODipine (NORVASC) 5 MG tablet Take 1 tablet (5 mg total) by mouth daily. 30 tablet 11  . atorvastatin (LIPITOR) 40 MG tablet Take 1 tablet (40 mg total) by mouth daily at 6 PM. 30 tablet 11  . carvedilol (COREG) 25 MG tablet Take 1 tablet (25 mg total) by mouth 2 (two) times daily with a meal. 60 tablet 11  . hydrochlorothiazide (HYDRODIURIL) 25 MG tablet TAKE ONE TABLET BY MOUTH ONCE DAILY 30 tablet 11  . metFORMIN (GLUCOPHAGE-XR) 500 MG 24 hr tablet TAKE ONE TABLET BY MOUTH ONCE DAILY WITH BREAKFAST. 90 tablet 3  . potassium chloride SA (K-DUR,KLOR-CON) 20 MEQ tablet Take 2 tablets (40 mEq total) by mouth daily. 60 tablet 2  . tamoxifen (NOLVADEX) 20 MG tablet TAKE ONE TABLET BY MOUTH ONCE DAILY 30 tablet 3  . traMADol (ULTRAM) 50 MG tablet Take 1 tablet (50 mg total) by mouth 2 (two) times daily as needed. 30 tablet 2  . venlafaxine XR (EFFEXOR-XR) 37.5 MG 24 hr capsule Take 1 capsule (37.5 mg total) by mouth daily. 30 capsule 1   No current facility-administered medications for this visit.    Objective: Physical Exam: BP 125/85 mmHg  Pulse 91  Temp(Src) 98.2 F (36.8 C) (Oral)  Wt 194 lb 9.6 oz (88.27 kg)  SpO2 100%  Gen: NAD, resting comfortably CV: Tachycardic, regular rhythm, with no murmurs appreciated Lungs: NWOB, CTAB with no crackles, wheezes, or rhonchi Abdomen: Normal bowel sounds present. Soft,  Nontender, Nondistended. Ext: no edema Skin: warm, dry Neuro: grossly normal, moves all extremities  A/P:   Tachycardia Likely related to rebound effect form being off carvedilol for the past 2-3 days. Had only been taking morning dose of carvedilol for the past several months. Given that patient's BP today was 125/85, recommended patient to stay off carvedilol.  She will follow up for blood pressure recheck in 1-2 weeks.    Also encouraged good PO intake to make  sure that she is not dehydrated. Will check TSH, CBC, and BMP today.   Precepted with Dr Mingo Amber.      Orders Placed This Encounter  Procedures  . TSH  . CBC  . Basic Metabolic Panel     Deleon Passe M. Jerline Pain, [place] Resident PGY-1 03/16/2014 3:21 PM

## 2014-03-16 NOTE — Assessment & Plan Note (Addendum)
Likely related to rebound effect form being off carvedilol for the past 2-3 days. Had only been taking morning dose of carvedilol for the past several months. Given that patient's BP today was 125/85, recommended patient to stay off carvedilol.  She will follow up for blood pressure recheck in 1-2 weeks.    Also encouraged good PO intake to make sure that she is not dehydrated. Will check TSH, CBC, and BMP today.   Precepted with Dr Mingo Amber.

## 2014-03-17 ENCOUNTER — Telehealth: Payer: Self-pay | Admitting: Family Medicine

## 2014-03-17 LAB — BASIC METABOLIC PANEL
BUN: 9 mg/dL (ref 6–23)
CO2: 30 mEq/L (ref 19–32)
Calcium: 9.3 mg/dL (ref 8.4–10.5)
Chloride: 98 mEq/L (ref 96–112)
Creat: 0.8 mg/dL (ref 0.50–1.10)
Glucose, Bld: 81 mg/dL (ref 70–99)
Potassium: 3.3 mEq/L — ABNORMAL LOW (ref 3.5–5.3)
Sodium: 140 mEq/L (ref 135–145)

## 2014-03-17 LAB — CBC
HCT: 42.2 % (ref 36.0–46.0)
Hemoglobin: 13.7 g/dL (ref 12.0–15.0)
MCH: 24.8 pg — AB (ref 26.0–34.0)
MCHC: 32.5 g/dL (ref 30.0–36.0)
MCV: 76.3 fL — ABNORMAL LOW (ref 78.0–100.0)
Platelets: 89 10*3/uL — ABNORMAL LOW (ref 150–400)
RBC: 5.53 MIL/uL — AB (ref 3.87–5.11)
RDW: 15.1 % (ref 11.5–15.5)
WBC: 9.7 10*3/uL (ref 4.0–10.5)

## 2014-03-17 LAB — TSH: TSH: 1.119 u[IU]/mL (ref 0.350–4.500)

## 2014-03-17 NOTE — Telephone Encounter (Signed)
Pt called and would like to speak to her doctor about her heart always racing. She was seen yesterday but she is still concerned and a little scared. jw

## 2014-03-17 NOTE — Telephone Encounter (Signed)
Will forward to Dr. Jerline Pain who saw her for this problem yesterday. Jaquelyne Firkus,CMA

## 2014-03-18 ENCOUNTER — Telehealth: Payer: Self-pay | Admitting: Family Medicine

## 2014-03-18 NOTE — Telephone Encounter (Signed)
Pt seen by Dr. Jerline Pain on 03/16/14. States she is still having the same issues she had then and would like to speak to MD/nurse about it. Please advise.

## 2014-03-18 NOTE — Telephone Encounter (Signed)
Called patient and informed of results of blood work. Patient reports that her blood pressure and been intermittently in the 140s/80s. Also reports that her heart rate has been climbing to 100s when her BP drops lower. Reassured patient that these are normal symptoms when stopping a beta blocker. Patient worried that her BP was climbing too high, but reassured her that 140s/80s were ok. Encouraged her to schedule an appointment next week if she is still concerned about her heart rate and blood pressure.   Algis Greenhouse. Jerline Pain, [place] Resident PGY-1 03/18/2014 3:00 PM

## 2014-03-19 ENCOUNTER — Other Ambulatory Visit: Payer: Self-pay

## 2014-03-19 ENCOUNTER — Emergency Department (HOSPITAL_COMMUNITY): Payer: BLUE CROSS/BLUE SHIELD

## 2014-03-19 ENCOUNTER — Encounter (HOSPITAL_COMMUNITY): Payer: Self-pay | Admitting: *Deleted

## 2014-03-19 ENCOUNTER — Emergency Department (HOSPITAL_COMMUNITY)
Admission: EM | Admit: 2014-03-19 | Discharge: 2014-03-19 | Disposition: A | Discharge status: Home / Self Care | Payer: BLUE CROSS/BLUE SHIELD | Attending: Emergency Medicine | Admitting: Emergency Medicine

## 2014-03-19 DIAGNOSIS — E119 Type 2 diabetes mellitus without complications: Secondary | ICD-10-CM | POA: Diagnosis not present

## 2014-03-19 DIAGNOSIS — Z862 Personal history of diseases of the blood and blood-forming organs and certain disorders involving the immune mechanism: Secondary | ICD-10-CM | POA: Insufficient documentation

## 2014-03-19 DIAGNOSIS — E079 Disorder of thyroid, unspecified: Secondary | ICD-10-CM | POA: Insufficient documentation

## 2014-03-19 DIAGNOSIS — Z79899 Other long term (current) drug therapy: Secondary | ICD-10-CM | POA: Insufficient documentation

## 2014-03-19 DIAGNOSIS — Z8739 Personal history of other diseases of the musculoskeletal system and connective tissue: Secondary | ICD-10-CM | POA: Insufficient documentation

## 2014-03-19 DIAGNOSIS — I10 Essential (primary) hypertension: Secondary | ICD-10-CM | POA: Insufficient documentation

## 2014-03-19 DIAGNOSIS — R0602 Shortness of breath: Secondary | ICD-10-CM | POA: Insufficient documentation

## 2014-03-19 DIAGNOSIS — R000 Tachycardia, unspecified: Secondary | ICD-10-CM | POA: Insufficient documentation

## 2014-03-19 DIAGNOSIS — Z853 Personal history of malignant neoplasm of breast: Secondary | ICD-10-CM | POA: Insufficient documentation

## 2014-03-19 DIAGNOSIS — E876 Hypokalemia: Secondary | ICD-10-CM | POA: Diagnosis not present

## 2014-03-19 LAB — BASIC METABOLIC PANEL
Anion gap: 9 (ref 5–15)
BUN: 7 mg/dL (ref 6–23)
CO2: 31 mmol/L (ref 19–32)
CREATININE: 0.92 mg/dL (ref 0.50–1.10)
Calcium: 9.4 mg/dL (ref 8.4–10.5)
Chloride: 98 mmol/L (ref 96–112)
GFR, EST AFRICAN AMERICAN: 80 mL/min — AB (ref >90)
GFR, EST NON AFRICAN AMERICAN: 69 mL/min — AB (ref >90)
Glucose, Bld: 191 mg/dL — ABNORMAL HIGH (ref 70–99)
Potassium: 2.8 mmol/L — ABNORMAL LOW (ref 3.5–5.1)
Sodium: 138 mmol/L (ref 135–145)

## 2014-03-19 LAB — CBC
HCT: 41 % (ref 36.0–46.0)
Hemoglobin: 13.4 g/dL (ref 12.0–15.0)
MCH: 25.2 pg — AB (ref 26.0–34.0)
MCHC: 32.7 g/dL (ref 30.0–36.0)
MCV: 77.1 fL — ABNORMAL LOW (ref 78.0–100.0)
PLATELETS: 102 10*3/uL — AB (ref 150–400)
RBC: 5.32 MIL/uL — AB (ref 3.87–5.11)
RDW: 14.5 % (ref 11.5–15.5)
WBC: 7.5 10*3/uL (ref 4.0–10.5)

## 2014-03-19 LAB — I-STAT TROPONIN, ED: TROPONIN I, POC: 0 ng/mL (ref 0.00–0.08)

## 2014-03-19 MED ORDER — POTASSIUM CHLORIDE CRYS ER 20 MEQ PO TBCR: 40.0000 meq | EXTENDED_RELEASE_TABLET | Freq: Two times a day (BID) | ORAL | Status: DC

## 2014-03-19 MED ORDER — CARVEDILOL 25 MG PO TABS
25.0000 mg | ORAL_TABLET | Freq: Two times a day (BID) | ORAL | Status: DC
  Administered 2014-03-19: 25 mg via ORAL
  Filled 2014-03-19 (×2): qty 1

## 2014-03-19 MED ORDER — METOPROLOL TARTRATE 1 MG/ML IV SOLN
2.5000 mg | Freq: Once | INTRAVENOUS | Status: DC
  Filled 2014-03-19: qty 5

## 2014-03-19 MED ORDER — CARVEDILOL 25 MG PO TABS: 25.0000 mg | ORAL_TABLET | Freq: Two times a day (BID) | ORAL | Status: DC

## 2014-03-19 NOTE — ED Notes (Signed)
X 2 days of heart racing, weakness, fatigue, sob, tingling in hands.

## 2014-03-19 NOTE — ED Provider Notes (Signed)
CSN: [id number]     Arrival date & time 03/19/14  0820 History   First MD Initiated Contact with Patient 03/19/14 [phone]     Chief Complaint  Patient presents with  . Tachycardia  . Shortness of Breath     (Consider location/radiation/quality/duration/timing/severity/associated sxs/prior Treatment) Patient is a 54 y.o. female presenting with shortness of breath. The history is provided by the patient.  Shortness of Breath Associated symptoms: chest pain   Associated symptoms: no abdominal pain, no headaches, no rash and no vomiting    patient with shortness of breath and fast heart rate. Has been going for the last few days. Get seen at her primary care doctor and the thought was that it was due to Coreg withdrawal. She's been tapering herself off it and not taking evening dose for a while. They check some basic blood work and stopped her Coreg. Shortness of breath and fast heart rate began around the same time. No chest pain. She has felt fatigue. She she has had some weight loss. Slight swelling in both of her ankles.  Past Medical History  Diagnosis Date  . Hypertension   . Hyperlipidemia   . Thrombocytopenia   . Diabetes mellitus   . Arthritis   . Breast CA     breast CA- right breast  . Thyroid disease     parathyroid removed  . Heel spur     bilateral   Past Surgical History  Procedure Laterality Date  . Breast surgery  09/12/10    excision of skin mass - right breast, had another surgery to remove more Cancer cells  . Parathyroidectomy      partial?  . Abdominal hysterectomy      1996 per pt  . Inner ear surgery  06/01/13   Family History  Problem Relation Age of Onset  . Heart disease Mother   . Stroke Father   . Heart disease Brother   . Colon cancer Neg Hx   . Esophageal cancer Neg Hx   . Rectal cancer Neg Hx   . Stomach cancer Neg Hx    History  Substance Use Topics  . Smoking status: Never Smoker   . Smokeless tobacco: Never Used  . Alcohol Use: No   OB  History    No data available     Review of Systems  Constitutional: Positive for unexpected weight change. Negative for activity change and appetite change.  Eyes: Negative for pain.  Respiratory: Positive for shortness of breath. Negative for chest tightness.   Cardiovascular: Positive for chest pain, palpitations and leg swelling.  Gastrointestinal: Negative for nausea, vomiting, abdominal pain and diarrhea.  Genitourinary: Negative for flank pain.  Musculoskeletal: Negative for back pain and neck stiffness.  Skin: Negative for rash.  Neurological: Negative for weakness, numbness and headaches.  Psychiatric/Behavioral: Negative for behavioral problems.      Allergies  Review of patient's allergies indicates no known allergies.  Home Medications   Prior to Admission medications   Medication Sig Start Date End Date Taking? Authorizing Provider  Acetaminophen 650 MG TABS Take 1 tablet (650 mg total) by mouth 3 (three) times daily as needed. 04/26/12  Yes Adlih Moreno-Coll, MD  amLODipine (NORVASC) 5 MG tablet Take 1 tablet (5 mg total) by mouth daily. 02/11/14  Yes Hilton Sinclair, MD  atorvastatin (LIPITOR) 40 MG tablet Take 1 tablet (40 mg total) by mouth daily at 6 PM. 02/21/14  Yes Hilton Sinclair, MD  Capsaicin 0.15 % LIQD  Apply 1 application topically as needed (pain).   Yes Historical Provider, MD  hydrochlorothiazide (HYDRODIURIL) 25 MG tablet TAKE ONE TABLET BY MOUTH ONCE DAILY 02/11/14  Yes Hilton Sinclair, MD  metFORMIN (GLUCOPHAGE-XR) 500 MG 24 hr tablet TAKE ONE TABLET BY MOUTH ONCE DAILY WITH BREAKFAST. 01/04/14  Yes Hilton Sinclair, MD  OVER THE COUNTER MEDICATION Apply 1 application topically as needed (pain). Menthol semisolid stick.   Yes Historical Provider, MD  tamoxifen (NOLVADEX) 20 MG tablet TAKE ONE TABLET BY MOUTH ONCE DAILY 02/09/14  Yes Rulon Eisenmenger, MD  traMADol (ULTRAM) 50 MG tablet Take 1 tablet (50 mg total) by mouth 2 (two) times daily as  needed. 01/04/14  Yes Hilton Sinclair, MD  venlafaxine XR (EFFEXOR-XR) 37.5 MG 24 hr capsule Take 1 capsule (37.5 mg total) by mouth daily. 02/09/14  Yes Rulon Eisenmenger, MD  carvedilol (COREG) 25 MG tablet Take 1 tablet (25 mg total) by mouth 2 (two) times daily with a meal. 03/19/14   Jasper Riling. Jazmina Muhlenkamp, MD  potassium chloride SA (K-DUR,KLOR-CON) 20 MEQ tablet Take 2 tablets (40 mEq total) by mouth 2 (two) times daily. 03/19/14   Jasper Riling. Contrell Ballentine, MD   BP 114/69 mmHg  Pulse 94  Temp(Src) 99.1 F (37.3 C) (Oral)  Resp 16  SpO2 99% Physical Exam  Constitutional: She is oriented to person, place, and time. She appears well-developed and well-nourished.  HENT:  Head: Normocephalic and atraumatic.  Neck: Normal range of motion. Neck supple.  Cardiovascular: Regular rhythm and normal heart sounds.   No murmur heard. Tachycardia  Pulmonary/Chest: Effort normal and breath sounds normal. No respiratory distress. She has no wheezes. She has no rales.  Abdominal: Soft. Bowel sounds are normal. She exhibits no distension. There is no tenderness. There is no rebound and no guarding.  Musculoskeletal: Normal range of motion.  Neurological: She is alert and oriented to person, place, and time. No cranial nerve deficit.  Skin: Skin is warm and dry.  Psychiatric: Her speech is normal.  Nursing note and vitals reviewed.   ED Course  Procedures (including critical care time) Labs Review Labs Reviewed  CBC - Abnormal; Notable for the following:    RBC 5.32 (*)    MCV 77.1 (*)    MCH 25.2 (*)    Platelets 102 (*)    All other components within normal limits  BASIC METABOLIC PANEL - Abnormal; Notable for the following:    Potassium 2.8 (*)    Glucose, Bld 191 (*)    GFR calc non Af Amer 69 (*)    GFR calc Af Amer 80 (*)    All other components within normal limits  Randolm Idol, ED    Imaging Review Dg Chest 2 View  03/19/2014   CLINICAL DATA:  Tachycardia and shortness of breath.   EXAM: CHEST  2 VIEW  COMPARISON:  04/26/2012  FINDINGS: The heart size and mediastinal contours are within normal limits. Both lungs are clear. Thoracic spine degenerative change.  IMPRESSION: No active cardiopulmonary disease.   Electronically Signed   By: Lovey Newcomer M.D.   On: 03/19/2014 09:42     EKG Interpretation   Date/Time:  Saturday March 19 2014 08:27:15 EST Ventricular Rate:  127 PR Interval:  222 QRS Duration: 86 QT Interval:  266 QTC Calculation: 386 R Axis:   66 Text Interpretation:  Sinus tachycardia Prolonged PR interval Borderline  repolarization abnormality Baseline wander in lead(s) V1 Confirmed by  [facility]  MD, Ovid Curd [phone]) on 03/19/2014 8:29:53 AM      MDM   Final diagnoses:  Tachycardia  Hypokalemia    Patient with tachycardia and some fatigue and shortness of breath. Has been going since she stopped her Coreg. Doubt pulmonary embolism, but was considered. Likely due to beta blocker withdrawal. It has improved with some rest. Will start back on Coreg.    Jasper Riling. Alvino Chapel, MD 03/20/14 [phone]

## 2014-03-19 NOTE — Discharge Instructions (Signed)
Hypokalemia Hypokalemia means that the amount of potassium in the blood is lower than normal.Potassium is a chemical, called an electrolyte, that helps regulate the amount of fluid in the body. It also stimulates muscle contraction and helps nerves function properly.Most of the body's potassium is inside of cells, and only a very small amount is in the blood. Because the amount in the blood is so small, minor changes can be life-threatening. CAUSES  Antibiotics.  Diarrhea or vomiting.  Using laxatives too much, which can cause diarrhea.  Chronic kidney disease.  Water pills (diuretics).  Eating disorders (bulimia).  Low magnesium level.  Sweating a lot. SIGNS AND SYMPTOMS  Weakness.  Constipation.  Fatigue.  Muscle cramps.  Mental confusion.  Skipped heartbeats or irregular heartbeat (palpitations).  Tingling or numbness. DIAGNOSIS  Your health care provider can diagnose hypokalemia with blood tests. In addition to checking your potassium level, your health care provider may also check other lab tests. TREATMENT Hypokalemia can be treated with potassium supplements taken by mouth or adjustments in your current medicines. If your potassium level is very low, you may need to get potassium through a vein (IV) and be monitored in the hospital. A diet high in potassium is also helpful. Foods high in potassium are:  Nuts, such as peanuts and pistachios.  Seeds, such as sunflower seeds and pumpkin seeds.  Peas, lentils, and lima beans.  Whole grain and bran cereals and breads.  Fresh fruit and vegetables, such as apricots, avocado, bananas, cantaloupe, kiwi, oranges, tomatoes, asparagus, and potatoes.  Orange and tomato juices.  Red meats.  Fruit yogurt. HOME CARE INSTRUCTIONS  Take all medicines as prescribed by your health care provider.  Maintain a healthy diet by including nutritious food, such as fruits, vegetables, nuts, whole grains, and lean meats.  If  you are taking a laxative, be sure to follow the directions on the label. SEEK MEDICAL CARE IF:  Your weakness gets worse.  You feel your heart pounding or racing.  You are vomiting or having diarrhea.  You are diabetic and having trouble keeping your blood glucose in the normal range. SEEK IMMEDIATE MEDICAL CARE IF:  You have chest pain, shortness of breath, or dizziness.  You are vomiting or having diarrhea for more than 2 days.  You faint. MAKE SURE YOU:   Understand these instructions.  Will watch your condition.  Will get help right away if you are not doing well or get worse. Document Released: 01/21/2005 Document Revised: 11/11/2012 Document Reviewed: 07/24/2012 [facility] Patient Information 2015 [place], [place]. This information is not intended to replace advice given to you by your health care provider. Make sure you discuss any questions you have with your health care provider.  Nonspecific Tachycardia Tachycardia is a faster than normal heartbeat (more than 100 beats per minute). In adults, the heart normally beats between 60 and 100 times a minute. A fast heartbeat may be a normal response to exercise or stress. It does not necessarily mean that something is wrong. However, sometimes when your heart beats too fast it may not be able to pump enough blood to the rest of your body. This can result in chest pain, shortness of breath, dizziness, and even fainting. Nonspecific tachycardia means that the specific cause or pattern of your tachycardia is unknown. CAUSES  Tachycardia may be harmless or it may be due to a more serious underlying cause. Possible causes of tachycardia include:  Exercise or exertion.  Fever.  Pain or injury.  Infection.  Loss of body fluids (dehydration).  Overactive thyroid.  Lack of red blood cells (anemia).  Anxiety and stress.  Alcohol.  Caffeine.  Tobacco products.  Diet pills.  Illegal drugs.  Heart  disease. SYMPTOMS  Rapid or irregular heartbeat (palpitations).  Suddenly feeling your heart beating (cardiac awareness).  Dizziness.  Tiredness (fatigue).  Shortness of breath.  Chest pain.  Nausea.  Fainting. DIAGNOSIS  Your caregiver will perform a physical exam and take your medical history. In some cases, a heart specialist (cardiologist) may be consulted. Your caregiver may also order:  Blood tests.  Electrocardiography. This test records the electrical activity of your heart.  A heart monitoring test. TREATMENT  Treatment will depend on the likely cause of your tachycardia. The goal is to treat the underlying cause of your tachycardia. Treatment methods may include:  Replacement of fluids or blood through an intravenous (IV) tube for moderate to severe dehydration or anemia.  New medicines or changes in your current medicines.  Diet and lifestyle changes.  Treatment for certain infections.  Stress relief or relaxation methods. HOME CARE INSTRUCTIONS   Rest.  Drink enough fluids to keep your urine clear or pale yellow.  Do not smoke.  Avoid:  Caffeine.  Tobacco.  Alcohol.  Chocolate.  Stimulants such as over-the-counter diet pills or pills that help you stay awake.  Situations that cause anxiety or stress.  Illegal drugs such as marijuana, phencyclidine (PCP), and cocaine.  Only take medicine as directed by your caregiver.  Keep all follow-up appointments as directed by your caregiver. SEEK IMMEDIATE MEDICAL CARE IF:   You have pain in your chest, upper arms, jaw, or neck.  You become weak, dizzy, or feel faint.  You have palpitations that will not go away.  You vomit, have diarrhea, or pass blood in your stool.  Your skin is cool, pale, and wet.  You have a fever that will not go away with rest, fluids, and medicine. MAKE SURE YOU:   Understand these instructions.  Will watch your condition.  Will get help right away if you  are not doing well or get worse. Document Released: 02/29/2004 Document Revised: 04/15/2011 Document Reviewed: 01/01/2011 [facility] Patient Information 2015 [place], [place]. This information is not intended to replace advice given to you by your health care provider. Make sure you discuss any questions you have with your health care provider.

## 2014-03-22 ENCOUNTER — Other Ambulatory Visit: Payer: Self-pay

## 2014-03-22 DIAGNOSIS — Z1231 Encounter for screening mammogram for malignant neoplasm of breast: Secondary | ICD-10-CM

## 2014-03-23 ENCOUNTER — Encounter: Payer: Self-pay | Admitting: Family Medicine

## 2014-03-23 ENCOUNTER — Ambulatory Visit (INDEPENDENT_AMBULATORY_CARE_PROVIDER_SITE_OTHER): Payer: BLUE CROSS/BLUE SHIELD | Admitting: Family Medicine

## 2014-03-23 VITALS — BP 114/71 | HR 81 | Temp 98.5°F | Ht 64.0 in | Wt 195.1 lb

## 2014-03-23 DIAGNOSIS — R000 Tachycardia, unspecified: Secondary | ICD-10-CM

## 2014-03-23 DIAGNOSIS — E785 Hyperlipidemia, unspecified: Secondary | ICD-10-CM | POA: Diagnosis not present

## 2014-03-23 DIAGNOSIS — E876 Hypokalemia: Secondary | ICD-10-CM

## 2014-03-23 LAB — BASIC METABOLIC PANEL WITH GFR
BUN: 12 mg/dL (ref 6–23)
CO2: 30 meq/L (ref 19–32)
Calcium: 9.6 mg/dL (ref 8.4–10.5)
Chloride: 102 meq/L (ref 96–112)
Creat: 0.68 mg/dL (ref 0.50–1.10)
Glucose, Bld: 158 mg/dL — ABNORMAL HIGH (ref 70–99)
Potassium: 3.5 meq/L (ref 3.5–5.3)
Sodium: 140 meq/L (ref 135–145)

## 2014-03-23 MED ORDER — ROSUVASTATIN CALCIUM 20 MG PO TABS: 20.0000 mg | ORAL_TABLET | Freq: Every day | ORAL | Status: DC

## 2014-03-23 NOTE — Progress Notes (Signed)
Patient ID: [name], female   DOB: 04-24-1960, 54 y.o.   MRN: [id number] Subjective:   CC: Follow up tachycardia  HPI:   F/u tachycardia, fatigue Patient was recently seen in the ED 2/13 due to tachycardia and dyspnea with pulse to 127, thought due to discontinuation of beta blockers. She had also been feeling somewhat weak and fatigued, which was affecting her daily functioning. At ED visit, restarted on carvedilol and has been starting to feel a little better. Denies chest pain or dyspnea. No MSK pain or LE edema more than baseline. Has stopped taking tramadol because she worries about making herself feel more fatigued. Would also like to stop venlafaxine because she is not having severe hot flashes and does not take for depression or anxiety.   Hyperlipidemia Recently started on atorvastatin due to 10 year ASCVD risk. Has since been having UE and abdominal itching. Would like to try something else.  Hypokalemia 2.8 in ED, could be contributing to fatigue. Started on potassium supplement.  Review of Systems - Per HPI.  PMH: H/o cancer of right breast, ITP, HTN, hyperparathyroidism, prediabetes, HLD, bilateral LE edema, plantar fasciitis, fatigue, h/o colonic polyps, abdominal spasms, transaminitis, ear canal growth, avitaminosis D, hip pain, mitral regurg, and tachycardia    Objective:  Physical Exam BP 114/71 mmHg  Pulse 81  Temp(Src) 98.5 F (36.9 C) (Oral)  Ht 5\' 4"  (1.626 m)  Wt 195 lb 1 oz (88.48 kg)  BMI 33.47 kg/m2 GEN: NAD CV: RRR, no m/r/g, 2+ B radial pulses PULM: CTAB, normal effort EXTR: No LE edema or calf tenderness NEURO: No focal deficits, normal speech and gait  Assessment:     [name] is a 54 y.o. female here for f/u tachycardia ED visit.    Plan:     # See problem list and after visit summary for problem-specific plans.   # Health Maintenance: Not discussed.  Follow-up: Follow up PRN for lack of improvement of fatigue.   Hilton Sinclair, MD [place]

## 2014-03-23 NOTE — Assessment & Plan Note (Signed)
Itching with atorvastatin.  - Change to rosuvastatin  - discussed that itching not usually from lipitor but will trial different medication.

## 2014-03-23 NOTE — Patient Instructions (Addendum)
Good to see you.  We are checking your potassium again.  Continue your carvedilol since you feel better and your heart rate and BP are fine on it. If you start to feel dizzy we may need to decrease the dose. Taper off venlafaxine after talking with your oncologist. If they say it is fine, I will send in a specific taper dose so we can stop it safely. Try rosuvastatin instead of atorvastatin. Follow up with me regularly to keep an eye on your BP which looks good today.  Best,  Hilton Sinclair, MD

## 2014-03-23 NOTE — Assessment & Plan Note (Signed)
Fatigue and malaise with recent visit to ED for tachycardia after discontinuation of beta blocker. Afebrile, vitals stable today. Coreg restarted 2/13 and has been starting to feel better. Has stopped tramadol.  - Continue coreg BID - Evaluated other meds - will taper off venlafaxine once patient discusses with oncology who put her on it for hot flashes. Pt to call if desires this and I will send in taper dose. - Recheck potassium today (2.8 in ED, has been supplementing). - Take tramadol PRN severe pain but continue to try not taking daily. - F/u PRN. If persistent fatigue, will need to further eval (h/o breast cancer, will also check CMET, TSH, A1c, CBC, vitamin D, B12). Recent echo with mild mitral regurg and grade 1 diastolic dysfunction.

## 2014-03-24 ENCOUNTER — Telehealth: Payer: Self-pay

## 2014-03-24 NOTE — Telephone Encounter (Signed)
Called patient per note from triage.  Let patient know that the Tamoxifen is causing the hot flashes and the venlaxafine is what is controlling the hot flashes.  If patient discontinues venlaxafine the hot flashes are likely to worsen.  Pt voiced understanding and stated she was going to remain on the venlaxafine.     Routed to Dr. Lindi Adie.

## 2014-03-24 NOTE — Telephone Encounter (Signed)
Pt called stating PCP Dr Dianah Field wants to know if it is OK to wean off the venlafexine now that the pt is having less hot flashes. Pt informed Dr Lindi Adie is out until next week so don't expect an answer until then. She said "OK, fine".

## 2014-03-25 ENCOUNTER — Telehealth: Payer: Self-pay | Admitting: Family Medicine

## 2014-03-25 DIAGNOSIS — R29898 Other symptoms and signs involving the musculoskeletal system: Secondary | ICD-10-CM

## 2014-03-25 NOTE — Telephone Encounter (Signed)
Will forward to MD. Ronelle Smallman,CMA  

## 2014-03-25 NOTE — Telephone Encounter (Signed)
Needs to discuss something with dr T. " she knows about it"

## 2014-03-27 ENCOUNTER — Encounter: Payer: Self-pay | Admitting: Family Medicine

## 2014-03-28 DIAGNOSIS — N62 Hypertrophy of breast: Secondary | ICD-10-CM | POA: Diagnosis not present

## 2014-03-28 DIAGNOSIS — C50911 Malignant neoplasm of unspecified site of right female breast: Secondary | ICD-10-CM | POA: Diagnosis not present

## 2014-03-29 ENCOUNTER — Telehealth: Payer: Self-pay | Admitting: *Deleted

## 2014-03-29 NOTE — Telephone Encounter (Signed)
Pt would like for you to give her a call...thanks

## 2014-03-29 NOTE — Telephone Encounter (Signed)
Pt is calling again and would like Dr. Darene Lamer to call her. She said this is her second call and doesn't understand why she has not received a call back. She would not tell why and said that she would only tell her dcotor. jw

## 2014-03-29 NOTE — Telephone Encounter (Signed)
Pt states that she has spoken with her oncologist and they don't want her to come off the medication you two had discussed.  Also she did say that her left hip did go out on her the other day.  She would like to know if she can benefit from some more PT and if so can you put in an order.  She also wanted to make you aware that you might be getting some papers from disability asking some medical questions.  She is ok for you to share information with them.  Adrick Kestler,CMA

## 2014-03-31 NOTE — Addendum Note (Signed)
Addended by: Conni Slipper T on: 03/31/2014 08:00 AM   Modules accepted: Orders

## 2014-03-31 NOTE — Telephone Encounter (Signed)
Spoke with patient and she confirmed that oncologist does not want her to stop effexor due to hot flashes.  With left hip going out and hip in more pain, I placed PT referral. I also let her know since my only appointment is more than a week out, she could call and try to schedule same day appointment for this with any provider. She thinks that that she wants to do and will call front office to schedule.  She also informed me of the disability phone call or papers that may be coming. I let her know I am happy to do what I can but disability has their own set of physicians to make evaluation. She is currently on disability and this will be an evaluation for continuation.   Hilton Sinclair, MD

## 2014-03-31 NOTE — Telephone Encounter (Signed)
Left message with a young man who answered the phone stating that I would try back later and to please let her know I called.  Will place PT order for core and hip girdle strengthening and keep my eyes out for paperwork.   Hilton Sinclair, MD

## 2014-04-04 ENCOUNTER — Ambulatory Visit (INDEPENDENT_AMBULATORY_CARE_PROVIDER_SITE_OTHER): Payer: BLUE CROSS/BLUE SHIELD | Admitting: Family Medicine

## 2014-04-04 ENCOUNTER — Encounter: Payer: Self-pay | Admitting: Family Medicine

## 2014-04-04 VITALS — BP 134/50 | HR 83 | Temp 98.6°F | Ht 64.0 in | Wt 199.0 lb

## 2014-04-04 DIAGNOSIS — M25551 Pain in right hip: Secondary | ICD-10-CM | POA: Diagnosis not present

## 2014-04-04 DIAGNOSIS — M25552 Pain in left hip: Secondary | ICD-10-CM | POA: Diagnosis not present

## 2014-04-04 NOTE — Patient Instructions (Signed)
Thank you so much for coming to see me today! I believe you have several things contributing to your hip pain, including greater trochanteric bursitis and arthritis. We will give you an injection today to help with the bursitis. If it is effective, you may return for another injection in your right hip. Please continue to take your Tramadol as needed and follow up with physical therapy.   Thanks again! Dr. Gerlean Ren  Hip Bursitis Bursitis is a swelling and soreness (inflammation) of a fluid-filled sac (bursa). This sac overlies and protects the joints.  CAUSES   Injury.  Overuse of the muscles surrounding the joint.  Arthritis.  Gout.  Infection.  Cold weather.  Inadequate warm-up and conditioning prior to activities. The cause may not be known.  SYMPTOMS   Mild to severe irritation.  Tenderness and swelling over the outside of the hip.  Pain with motion of the hip.  If the bursa becomes infected, a fever may be present. Redness, tenderness, and warmth will develop over the hip. Symptoms usually lessen in 3 to 4 weeks with treatment, but can come back. TREATMENT If conservative treatment does not work, your caregiver may advise draining the bursa and injecting cortisone into the area. This may speed up the healing process. This may also be used as an initial treatment of choice. HOME CARE INSTRUCTIONS   Apply ice to the affected area for 15-20 minutes every 3 to 4 hours while awake for the first 2 days. Put the ice in a plastic bag and place a towel between the bag of ice and your skin.  Rest the painful joint as much as possible, but continue to put the joint through a normal range of motion at least 4 times per day. When the pain lessens, begin normal, slow movements and usual activities to help prevent stiffness of the hip.  Only take over-the-counter or prescription medicines for pain, discomfort, or fever as directed by your caregiver.  Use crutches to limit weight  bearing on the hip joint, if advised.  Elevate your painful hip to reduce swelling. Use pillows for propping and cushioning your legs and hips.  Gentle massage may provide comfort and decrease swelling. SEEK IMMEDIATE MEDICAL CARE IF:   Your pain increases even during treatment, or you are not improving.  You have a fever.  You have heat and inflammation over the involved bursa.  You have any other questions or concerns. MAKE SURE YOU:   Understand these instructions.  Will watch your condition.  Will get help right away if you are not doing well or get worse. Document Released: 07/13/2001 Document Revised: 04/15/2011 Document Reviewed: 02/10/2008 [facility] Patient Information 2015 [place], [place]. This information is not intended to replace advice given to you by your health care provider. Make sure you discuss any questions you have with your health care provider.

## 2014-04-05 ENCOUNTER — Telehealth: Payer: Self-pay | Admitting: Family Medicine

## 2014-04-05 NOTE — Telephone Encounter (Signed)
Pt reports Tramadol is not helping with hip pain. She was seen in office yesterday and was prescribed this and would like something else sent in. Please advise. Will forward to MD see saw yesterday in reference to this. Alizandra Loh, CMA.

## 2014-04-05 NOTE — Assessment & Plan Note (Signed)
-   Suspect multifactorial etiology, including bilaterally arthritis and greater trochanteric bursitis - Left Greater Trochanter Injection  - Consent obtained  - Area sterilized with alcohol swabs x2  - Injection with 3:1 mixture of Xylocaine and Depomedrol over greater trochanter  - Bandage applied  - No complications noted. Almost immediate relief of pain from Xylocaine noted - May return for injection of right hip if effective - Continue use of Tramadol as needed for pain.  - Continue use of heat - Encouraged physical therapy - Follow up with PCP

## 2014-04-05 NOTE — Telephone Encounter (Signed)
Tramadol not working. Can dr prescribe something else? Leave message with whoever answers the phone?

## 2014-04-05 NOTE — Progress Notes (Signed)
Subjective:     Patient ID: [name], female   DOB: 07/22/1960, 54 y.o.   MRN: [id number]  HPI [name] is a 54yo female presenting today for follow up on hip pain. - Pain 8/10 in severity and located over greater trochanters bilaterally, L>R. Pain started in left hip one month ago and then began in right hip yesterday (2/28) - Has been taking Tramadol for pain as needed. Effective at relieving pain. Takes it a few days a week, q4 hours, only on days when pain is extreme - Also uses heating pad with relief - Called with complaint of hip "going out" on 2/25. When asked to further explain this, she describes it more as a burning sensation and not as hip popping out - Can't walk 24feet without stopping - Can walk very slowly without assistance, but needs help from others to walk at normal speed - Ambulation limited by known arthritis - Xrays of hips on 01/05/14 showed degenerative changes in both hips - Referral to PT made by PCP on 2/25  Review of Systems  Musculoskeletal: Positive for arthralgias and gait problem.       Objective:   Physical Exam  Constitutional: She appears well-developed and well-nourished. No distress.  Cardiovascular: Normal rate and normal heart sounds.  Exam reveals no gallop and no friction rub.   No murmur heard. Pulmonary/Chest: Effort normal. No respiratory distress. She has no wheezes. She has no rales.  Musculoskeletal:  Pain in hip with ROM. Significant tenderness to palpation over greater trochanters bilaterally, L>R  Psychiatric: She has a normal mood and affect. Her behavior is normal.       Assessment:     Please refer to Problem List for Assessment.    Plan:     Please refer to Problem List for Plan.

## 2014-04-07 NOTE — Telephone Encounter (Signed)
Called Mrs. Daughtrey and family member answered. Patient left instructions to discuss with whoever answers phone. Person is unsure if pain is improved or if she still needs additional pain medication. Instructed to have Mrs. Hoffert call office and update on pain status and how steroid injection and Tramadol is working. Will consider additional pain medication if still needed.

## 2014-04-12 ENCOUNTER — Ambulatory Visit: Payer: BLUE CROSS/BLUE SHIELD | Admitting: Family Medicine

## 2014-04-19 ENCOUNTER — Ambulatory Visit: Payer: BLUE CROSS/BLUE SHIELD | Attending: Family Medicine | Admitting: Physical Therapy

## 2014-04-19 DIAGNOSIS — M24652 Ankylosis, left hip: Secondary | ICD-10-CM

## 2014-04-19 DIAGNOSIS — M25659 Stiffness of unspecified hip, not elsewhere classified: Secondary | ICD-10-CM

## 2014-04-19 DIAGNOSIS — M25559 Pain in unspecified hip: Secondary | ICD-10-CM | POA: Insufficient documentation

## 2014-04-19 DIAGNOSIS — M25552 Pain in left hip: Secondary | ICD-10-CM

## 2014-04-19 DIAGNOSIS — R269 Unspecified abnormalities of gait and mobility: Secondary | ICD-10-CM

## 2014-04-19 DIAGNOSIS — M24651 Ankylosis, right hip: Secondary | ICD-10-CM

## 2014-04-19 DIAGNOSIS — M1990 Unspecified osteoarthritis, unspecified site: Secondary | ICD-10-CM | POA: Insufficient documentation

## 2014-04-19 DIAGNOSIS — R531 Weakness: Secondary | ICD-10-CM

## 2014-04-19 DIAGNOSIS — M25551 Pain in right hip: Secondary | ICD-10-CM

## 2014-04-19 NOTE — Patient Instructions (Signed)
   Josue Kass PT, DPT, LAT, ATC  [facility] Phone: [phone]     

## 2014-04-19 NOTE — Therapy (Addendum)
[facility] [place], [place], [address] Phone: [phone]   Fax:  [phone]  Physical Therapy Evaluation  Patient Details  Name: [name] MRN: [id number] Date of Birth: [birth year] Referring Provider:  Hilton Lawson, *  Encounter Date: 04/19/2014      PT End of Session - 04/19/14 1516    Visit Number 1   Number of Visits 16   Date for PT Re-Evaluation 06/19/14   PT Start Time 0930   PT Stop Time 1015   PT Time Calculation (min) 45 min   Activity Tolerance Patient tolerated treatment well   Behavior During Therapy [facility] for tasks assessed/performed      Past Medical History  Diagnosis Date  . Hypertension   . Hyperlipidemia   . Thrombocytopenia   . Diabetes mellitus   . Arthritis   . Breast CA     breast CA- right breast  . Thyroid disease     parathyroid removed  . Heel spur     bilateral    Past Surgical History  Procedure Laterality Date  . Breast surgery  09/12/10    excision of skin mass - right breast, had another surgery to remove more Cancer cells  . Parathyroidectomy      partial?  . Abdominal hysterectomy      1996 per pt  . Inner ear surgery  06/01/13    There were no vitals filed for this visit.  Visit Diagnosis:  Bilateral hip pain - Plan: PT plan of care cert/re-cert  General weakness - Plan: PT plan of care cert/re-cert  Decreased range of hip movement, right - Plan: PT plan of care cert/re-cert  Decreased range of motion of hip, left - Plan: PT plan of care cert/re-cert  Abnormality of gait - Plan: PT plan of care cert/re-cert  Stiffness of hip joint, unspecified laterality - Plan: PT plan of care cert/re-cert      Subjective Assessment - 04/19/14 0936    Symptoms pt is a 54 y.o. F with CC of bil hip weakness and pain L>R . reports it feels like her hips will lock up after prolonged walking that started 5-6 months ago with the symptoms getting a little worse  since onset.    Pertinent History Pre-diabetes; breast CA survivor   Limitations Standing;Walking;House hold activities;Lifting   How long can you sit comfortably? 10-15 minutes   How long can you stand comfortably? 15 minutes   How long can you walk comfortably? 15 minutes   Diagnostic tests x-rays in February impression arthitis.   Patient Stated Goals to be able to walk more than 15 minutes, to be pain free and to be able to exercise.    Currently in Pain? Yes   Pain Score 7    Pain Location Hip   Pain Orientation Right;Left   Pain Descriptors / Indicators Aching;Burning   Pain Type Chronic pain   Pain Onset More than a month ago   Pain Frequency Constant   Aggravating Factors  standing up from sitting, prolonged walking   Pain Relieving Factors Tylenol, Tramadol but doesn't like taking, heat pad.    Effect of Pain on Daily Activities limited in prlonged weight bearing activities            [facility] PT Assessment - 04/19/14 0001    Assessment   Medical Diagnosis bilateral hip pain and weakness   Onset Date 11/18/13   Next MD Visit make appointment as needed  Prior Therapy bil hip 2 months ago   Precautions   Precautions None   Restrictions   Weight Bearing Restrictions No   Balance Screen   Has the patient fallen in the past 6 months No   Has the patient had a decrease in activity level because of a fear of falling?  No   Is the patient reluctant to leave their home because of a fear of falling?  No   Home Environment   Living Enviornment Private residence   Living Arrangements Spouse/significant other   Available Help at Discharge Available 24 hours/day   Type of [place] to enter   Entrance Stairs-Number of Steps 3   Entrance Stairs-Rails Can reach both   Home Layout One level   ROM / Strength   AROM / PROM / Strength AROM;Strength   AROM   AROM Assessment Site Hip   Right/Left Hip Left;Right   Right Hip Flexion 93   Right Hip External  Rotation  35   Right Hip Internal Rotation  36   Left Hip Flexion 72   Left Hip External Rotation  18   Left Hip Internal Rotation  26   Strength   Strength Assessment Site Hip   Right/Left Hip Right;Left   Right Hip Flexion 3/5   Right Hip Extension 3/5   Right Hip External Rotation  3   Right Hip Internal Rotation  3   Right Hip ABduction 3+/5   Right Hip ADduction 3+/5   Left Hip Flexion 3+/5   Left Hip Extension 3/5   Left Hip External Rotation  3   Left Hip Internal Rotation  3   Left Hip ABduction 3+/5   Left Hip ADduction 3+/5   Right/Left Knee Right;Left   Right Knee Flexion 4/5   Right Knee Extension 4/5   Left Knee Flexion 4/5   Left Knee Extension 4/5   Flexibility   Hamstrings 52 degrees on the L, 42 on the R  pain at end range with both   Palpation   Palpation tenderness noted in Bil hip gluteal muscles, and greater trochanter.   Special Tests    Special Tests Hip Special Tests   Saralyn Pilar (FABER) Test   Findings Positive   Side Right;Left   SI Distraction   Findings  Negative   Side Right;Left   Hip Scouring   Findings Positive   Side Left;Right   Standardized Balance Assessment   Standardized Balance Assessment Timed Up and Go Test   Timed Up and Go Test   TUG Normal TUG   Normal TUG (seconds) 32   TUG Comments decreased step length , postural sway, and antalgic gait pattern                   [facility] Adult PT Treatment/Exercise - 04/19/14 0001    Ambulation/Gait   Ambulation/Gait Yes   Gait Pattern Decreased step length - left;Decreased step length - right;Step-to pattern;Antalgic;Poor foot clearance - left;Poor foot clearance - right   Posture/Postural Control   Posture/Postural Control Postural limitations   Postural Limitations Rounded Shoulders;Forward head;Anterior pelvic tilt;Flexed trunk                PT Education - 04/19/14 1516    Education provided Yes   Education Details evaluation findings, POC, Prognosis, goals,  HEP   Person(s) Educated Patient   Methods Explanation   Comprehension Verbalized understanding          PT  Short Term Goals - 04/19/14 1526    PT SHORT TERM GOAL #1   Title "Independent with basic HEP (04/19/2014)   Time 4   Period Weeks   Status New   PT SHORT TERM GOAL #2   Title Left hip flexion ROM improved to 110 degrees needed for greater ease with getting in/out of bed, sit to stand from standard chairs.(04/19/2014)   Time 4   Period Weeks   Status New   PT SHORT TERM GOAL #3   Title decrease pain to <5/10 during and following activity to assist with functional progression (04/19/2014)   Period Days   Status New   PT SHORT TERM GOAL #4   Title pt will decrease TUG time by > 8 seconds to assist with safety and gait efficency.    Time 4   Period Weeks   Status New           PT Long Term Goals - 04/19/14 1528    PT LONG TERM GOAL #1   Title "Pt will be independent with advanced HEP (04/19/2014)   Time 8   Period Weeks   Status New   PT LONG TERM GOAL #2   Title Patient will have improved bilateral hip abd strength to 4-/5 needed for prolonged standing and walking with <3/10 pain (04/19/2014)   Time 8   Period Weeks   Status New   PT LONG TERM GOAL #3   Title pt will decreasea TUG time by greater than 12 seconds to assist with gait safety and community ambulation. (04/19/2014)   Time 8   Period Weeks   Status New   PT LONG TERM GOAL #4   Title pt will decrease pain to <3/10 during and following prolonged weight bearing activity to assist wtih ADL's and grocery shopping. (04/19/2014)   Time [place]   Status New               Plan - 04/19/14 1517    Clinical Impression Statement Jackelyn presents to OPPT with CC of Bil constant hip pain and weakness L>R. limited AROM/PROM due to pain and soft tissue restriction of Bil hamstrings/ hip flexor muscle tightness. overall hip strength is 3+/5 with pain during testin. Pos special testing bil including  FABER, hip scour, are consistent with bil hip Osteo arthritis. Her TUG time today was 32 seconds and demonstrates an antalgic gait with limited step length bilateral with anterior pelvic tilt and slumped sitting/standing posture.  She would benefit from skilled physical therapy to maximize her functional capcity and reduce pain by addressing the defecits listed.    Rehab Potential Good   PT Frequency 2x / week   PT Duration 8 weeks   PT Treatment/Interventions ADLs/Self Care Home Management;Cryotherapy;Electrical Stimulation;Moist Heat;Therapeutic activities;Therapeutic exercise;Neuromuscular re-education;Patient/family education;Manual techniques;Dry needling;Other (comment)   PT Next Visit Plan assess respone to HEP, hip mobilization, modalities for pain (hx of breast cancer)   PT Home Exercise Plan bridges, hamstring stretch, sidelying hip abduction   Consulted and Agree with Plan of Care Patient         Problem List Patient Active Problem List   Diagnosis Date Noted  . Tachycardia 03/16/2014  . Mitral regurgitation 02/18/2014  . Hip pain, bilateral 01/07/2014  . 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 Transaminitis 11/13/2012  . Abdominal spasms 05/01/2012  . Personal history of colonic polyps 04/17/2012  . Bilateral foot pain 02/21/2012  . Fatigue 11/12/2011  . Health care maintenance 11/12/2011  .  Plantar fasciitis 06/12/2011  . Hyperlipidemia 04/04/2011  . Bilateral lower extremity edema 04/04/2011  . Prediabetes 06/07/2010  . Cancer of central portion of right female breast 10/04/2009  . Idiopathic thrombocytopenic purpura (ITP) 06/22/2009  . Essential hypertension 06/22/2009  . HYPERPARATHYROIDISM, HX OF 06/22/2009  . Avitaminosis D 09/01/2008   Starr Lake PT, DPT, LAT, ATC  04/19/2014  8:02 PM   [facility] [address] [place], [place], [address] Phone: [phone]   Fax:  [phone]

## 2014-04-25 ENCOUNTER — Ambulatory Visit: Payer: BLUE CROSS/BLUE SHIELD | Admitting: Physical Therapy

## 2014-04-25 ENCOUNTER — Ambulatory Visit
Admission: RE | Admit: 2014-04-25 | Discharge: 2014-04-25 | Disposition: A | Discharge status: Home / Self Care | Payer: Medicare Other | Source: Ambulatory Visit

## 2014-04-25 ENCOUNTER — Other Ambulatory Visit: Payer: Self-pay

## 2014-04-25 DIAGNOSIS — Z1231 Encounter for screening mammogram for malignant neoplasm of breast: Secondary | ICD-10-CM

## 2014-04-25 DIAGNOSIS — M25552 Pain in left hip: Principal | ICD-10-CM

## 2014-04-25 DIAGNOSIS — M25559 Pain in unspecified hip: Secondary | ICD-10-CM | POA: Diagnosis not present

## 2014-04-25 DIAGNOSIS — R531 Weakness: Secondary | ICD-10-CM

## 2014-04-25 DIAGNOSIS — M1990 Unspecified osteoarthritis, unspecified site: Secondary | ICD-10-CM | POA: Diagnosis not present

## 2014-04-25 DIAGNOSIS — M25659 Stiffness of unspecified hip, not elsewhere classified: Secondary | ICD-10-CM

## 2014-04-25 DIAGNOSIS — M25551 Pain in right hip: Secondary | ICD-10-CM

## 2014-04-25 DIAGNOSIS — M24651 Ankylosis, right hip: Secondary | ICD-10-CM

## 2014-04-25 DIAGNOSIS — M24652 Ankylosis, left hip: Secondary | ICD-10-CM

## 2014-04-25 NOTE — Therapy (Addendum)
[place], [place], [address] Phone: [phone]   Fax:  [phone]  Physical Therapy Treatment  Patient Details  Name: [name] MRN: [id number] Date of Birth: Jun 12, 1960 Referring Provider:  Hilton Sinclair, *  Encounter Date: 04/25/2014      PT End of Session - 04/25/14 0920    Visit Number 2   Number of Visits 16   Date for PT Re-Evaluation 06/19/14   PT Start Time 0845   PT Stop Time 0933   PT Time Calculation (min) 48 min   Activity Tolerance Patient tolerated treatment well   Behavior During Therapy [facility] for tasks assessed/performed      Past Medical History  Diagnosis Date  . Hypertension   . Hyperlipidemia   . Thrombocytopenia   . Diabetes mellitus   . Arthritis   . Breast CA     breast CA- right breast  . Thyroid disease     parathyroid removed  . Heel spur     bilateral    Past Surgical History  Procedure Laterality Date  . Breast surgery  09/12/10    excision of skin mass - right breast, had another surgery to remove more Cancer cells  . Parathyroidectomy      partial?  . Abdominal hysterectomy      1996 per pt  . Inner ear surgery  06/01/13    There were no vitals filed for this visit.  Visit Diagnosis:  Bilateral hip pain  General weakness  Decreased range of hip movement, right  Decreased range of motion of hip, left  Stiffness of hip joint, unspecified laterality      Subjective Assessment - 04/25/14 0845    Symptoms pt states the pain only gets worse when it gets cold but states that today she is feeling a little better.    Currently in Pain? Yes   Pain Score 6    Pain Location Hip   Pain Orientation Right;Left;Lateral;Posterior   Pain Descriptors / Indicators Aching   Pain Type Chronic pain   Pain Onset More than a month ago                       [facility] Adult PT Treatment/Exercise - 04/25/14 0847    Knee/Hip Exercises: Aerobic    Stationary Bike Nu Step L9 x 6 min   Knee/Hip Exercises: Seated   Other Seated Knee Exercises sit to stand from table 2 x 8  with table lowered between sets   Knee/Hip Exercises: Supine   Bridges AROM;Strengthening;Both;2 sets;10 reps  with glute squeeze for 2 sec hold   Knee/Hip Exercises: Sidelying   Clams supine 2 x 10   red theraband   Modalities   Modalities Moist Heat   Moist Heat Therapy   Number Minutes Moist Heat 10 Minutes   Moist Heat Location Other (comment)  left lateral hip in R sidelying   Manual Therapy   Manual Therapy Joint mobilization   Joint Mobilization long axis distraction, A/P and P/A oscillations grade 2-3  bil                PT Education - 04/25/14 0919    Education provided Yes   Education Details hamstring stretching and functional sit to stand exercise          PT Short Term Goals - 04/19/14 1526    PT SHORT TERM GOAL #1   Title "Independent with basic HEP (04/19/2014)  Time 4   Period Weeks   Status New   PT SHORT TERM GOAL #2   Title Left hip flexion ROM improved to 110 degrees needed for greater ease with getting in/out of bed, sit to stand from standard chairs.(04/19/2014)   Time 4   Period Weeks   Status New   PT SHORT TERM GOAL #3   Title decrease pain to <5/10 during and following activity to assist with functional progression (04/19/2014)   Period Days   Status New   PT SHORT TERM GOAL #4   Title pt will decrease TUG time by > 8 seconds to assist with safety and gait efficency.    Time 4   Period Weeks   Status New           PT Long Term Goals - 04/25/14 0931    PT LONG TERM GOAL #1   Title "Pt will be independent with advanced HEP (04/19/2014)   Time 8   Period Weeks   Status New   PT LONG TERM GOAL #2   Title Patient will have improved bilateral hip abd strength to 4-/5 needed for prolonged standing and walking with <3/10 pain (04/19/2014)   Time 8   Period Weeks   Status New   PT LONG TERM GOAL #3    Title pt will decreasea TUG time by greater than 12 seconds to assist with gait safety and community ambulation. (04/19/2014)   Time 8   Period Weeks   Status New   PT LONG TERM GOAL #4   Title pt will decrease pain to <3/10 during and following prolonged weight bearing activity to assist wtih ADL's and grocery shopping. (04/19/2014)   Time 8   Period Weeks   PT LONG TERM GOAL #5   Title pt will be able to verbalize and demonstrate techniques to reduce risk or reinjury via proper posture and lifting/carrying mechanics, pain control and HEP   Time 8   Period Weeks   Status New               Plan - 04/25/14 0920    Clinical Impression Statement [name] reported a decrease in pain with the R feeling good and the L hip being symptomatic today.  She was able to perform all exercises with the only complaint of increase muscular fatigue. Following hip mobilizations/ and exercises she reported feeling good with decreased pain in the L hip   Consulted and Agree with Plan of Care Patient        Problem List Patient Active Problem List   Diagnosis Date Noted  . Tachycardia 03/16/2014  . Mitral regurgitation 02/18/2014  . Hip pain, bilateral 01/07/2014  . 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 Transaminitis 11/13/2012  . Abdominal spasms 05/01/2012  . Personal history of colonic polyps 04/17/2012  . Bilateral foot pain 02/21/2012  . Fatigue 11/12/2011  . Health care maintenance 11/12/2011  . Plantar fasciitis 06/12/2011  . Hyperlipidemia 04/04/2011  . Bilateral lower extremity edema 04/04/2011  . Prediabetes 06/07/2010  . Cancer of central portion of right female breast 10/04/2009  . Idiopathic thrombocytopenic purpura (ITP) 06/22/2009  . Essential hypertension 06/22/2009  . HYPERPARATHYROIDISM, HX OF 06/22/2009  . Avitaminosis D 09/01/2008   Starr Lake PT, DPT, LAT, ATC  04/25/2014  9:32 AM  [facility] [address] [place], [place], [address] Phone: [phone]   Fax:  [phone]     PHYSICAL THERAPY DISCHARGE SUMMARY  Visits from  Start of Care: 2  Current functional level related to goals / functional outcomes: See goals   Remaining deficits: Pt has been seen since the last visit.    Education / Equipment: HEP  Plan: Patient agrees to discharge.  Patient goals were not met. Patient is being discharged due to not returning since the last visit.  ?????       Millisa Giarrusso PT, DPT, LAT, ATC  12/12/2014  2:54 PM

## 2014-05-03 ENCOUNTER — Ambulatory Visit: Payer: BLUE CROSS/BLUE SHIELD | Admitting: Physical Therapy

## 2014-05-05 ENCOUNTER — Ambulatory Visit: Payer: BLUE CROSS/BLUE SHIELD | Admitting: Physical Therapy

## 2014-05-10 ENCOUNTER — Ambulatory Visit: Payer: BLUE CROSS/BLUE SHIELD | Attending: Family Medicine | Admitting: Physical Therapy

## 2014-05-10 DIAGNOSIS — M25559 Pain in unspecified hip: Secondary | ICD-10-CM | POA: Insufficient documentation

## 2014-05-10 DIAGNOSIS — M1990 Unspecified osteoarthritis, unspecified site: Secondary | ICD-10-CM | POA: Insufficient documentation

## 2014-05-12 ENCOUNTER — Telehealth: Payer: Self-pay | Admitting: Physical Therapy

## 2014-05-12 ENCOUNTER — Ambulatory Visit: Payer: BLUE CROSS/BLUE SHIELD | Admitting: Physical Therapy

## 2014-05-12 NOTE — Telephone Encounter (Signed)
Spoke with Husband and left message for [name] to give me a call back due to 1 cancelled appointment and 2 no shows with no future appointments scheduled.

## 2014-05-15 ENCOUNTER — Other Ambulatory Visit: Payer: Self-pay | Admitting: Hematology and Oncology

## 2014-05-16 ENCOUNTER — Other Ambulatory Visit: Payer: Self-pay | Admitting: *Deleted

## 2014-05-16 DIAGNOSIS — C50919 Malignant neoplasm of unspecified site of unspecified female breast: Secondary | ICD-10-CM

## 2014-05-16 MED ORDER — VENLAFAXINE HCL ER 37.5 MG PO CP24: 37.5000 mg | ORAL_CAPSULE | Freq: Every day | ORAL | Status: DC

## 2014-05-19 ENCOUNTER — Ambulatory Visit (HOSPITAL_BASED_OUTPATIENT_CLINIC_OR_DEPARTMENT_OTHER): Payer: BLUE CROSS/BLUE SHIELD | Admitting: Hematology and Oncology

## 2014-05-19 ENCOUNTER — Other Ambulatory Visit (HOSPITAL_BASED_OUTPATIENT_CLINIC_OR_DEPARTMENT_OTHER): Payer: BLUE CROSS/BLUE SHIELD

## 2014-05-19 ENCOUNTER — Telehealth: Payer: Self-pay | Admitting: Hematology and Oncology

## 2014-05-19 VITALS — BP 120/47 | HR 94 | Temp 99.1°F | Resp 18 | Ht 64.0 in | Wt 196.2 lb

## 2014-05-19 DIAGNOSIS — D509 Iron deficiency anemia, unspecified: Secondary | ICD-10-CM

## 2014-05-19 DIAGNOSIS — C50111 Malignant neoplasm of central portion of right female breast: Secondary | ICD-10-CM

## 2014-05-19 DIAGNOSIS — Z853 Personal history of malignant neoplasm of breast: Secondary | ICD-10-CM

## 2014-05-19 DIAGNOSIS — Z79818 Long term (current) use of other agents affecting estrogen receptors and estrogen levels: Secondary | ICD-10-CM

## 2014-05-19 LAB — CBC WITH DIFFERENTIAL/PLATELET
BASO%: 0.3 % (ref 0.0–2.0)
Basophils Absolute: 0 10*3/uL (ref 0.0–0.1)
EOS%: 1.2 % (ref 0.0–7.0)
Eosinophils Absolute: 0.1 10*3/uL (ref 0.0–0.5)
HCT: 40.3 % (ref 34.8–46.6)
HGB: 13.1 g/dL (ref 11.6–15.9)
LYMPH%: 24.2 % (ref 14.0–49.7)
MCH: 25 pg — ABNORMAL LOW (ref 25.1–34.0)
MCHC: 32.5 g/dL (ref 31.5–36.0)
MCV: 77.1 fL — ABNORMAL LOW (ref 79.5–101.0)
MONO#: 0.5 10*3/uL (ref 0.1–0.9)
MONO%: 4.9 % (ref 0.0–14.0)
NEUT#: 6.9 10*3/uL — ABNORMAL HIGH (ref 1.5–6.5)
NEUT%: 69.4 % (ref 38.4–76.8)
Platelets: 106 10*3/uL — ABNORMAL LOW (ref 145–400)
RBC: 5.23 10*6/uL (ref 3.70–5.45)
RDW: 14.7 % — ABNORMAL HIGH (ref 11.2–14.5)
WBC: 9.9 10*3/uL (ref 3.9–10.3)
lymph#: 2.4 10*3/uL (ref 0.9–3.3)
nRBC: 0 % (ref 0–0)

## 2014-05-19 LAB — COMPREHENSIVE METABOLIC PANEL (CC13)
ALT: 39 U/L (ref 0–55)
AST: 45 U/L — ABNORMAL HIGH (ref 5–34)
Albumin: 3.3 g/dL — ABNORMAL LOW (ref 3.5–5.0)
Alkaline Phosphatase: 53 U/L (ref 40–150)
Anion Gap: 10 mEq/L (ref 3–11)
BUN: 9.9 mg/dL (ref 7.0–26.0)
CO2: 29 mEq/L (ref 22–29)
Calcium: 9.1 mg/dL (ref 8.4–10.4)
Chloride: 103 mEq/L (ref 98–109)
Creatinine: 0.9 mg/dL (ref 0.6–1.1)
EGFR: 90 mL/min/{1.73_m2} — ABNORMAL LOW (ref >90)
Glucose: 157 mg/dl — ABNORMAL HIGH (ref 70–140)
Potassium: 3.4 mEq/L — ABNORMAL LOW (ref 3.5–5.1)
Sodium: 142 mEq/L (ref 136–145)
Total Bilirubin: 0.47 mg/dL (ref 0.20–1.20)
Total Protein: 7.3 g/dL (ref 6.4–8.3)

## 2014-05-19 NOTE — Telephone Encounter (Signed)
per pof to sch pt appt-gave pt copy of sch °

## 2014-05-19 NOTE — Assessment & Plan Note (Addendum)
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  Tamoxifen toxicities: 1. Hot flashes improved with Effexor 2. Plan is to continue tamoxifen for 10 years versus switching her to aromatase inhibitors once she reaches menopause  Breast cancer surveillance: 1. Breast exam 05/19/2014 is normal 2. Mammogram 04/25/2014 is normal on the left breast category B breast density  We will check iron studies along with deficits in his levels today. If she is menopausal we will switch her to anastrozole 1 mg daily. If she is not menopausal and she'll continue with tamoxifen for 10 years. Patient complains of hair loss.  Survivorship: Patient is exercising and had lost significant weight and stays active. Encouraged her to continue with what she is doing. Return to clinic in 6 months for follow-up with labs

## 2014-05-19 NOTE — Progress Notes (Signed)
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 Harlow Mares   02/13/2010 -  Anti-estrogen oral therapy Tamoxifen 20 mg daily   02/19/2010 - 03/28/2010 Radiation Therapy Adjuvant radiation therapy   09/12/2010 Surgery Recurrence of invasive ductal carcinoma of the right mastectomy scar is a nodule, invasive ductal carcinoma margins positive, ER 96%, PR 75%, HER-2 negative   09/17/2013 Surgery Left breast reduction surgery    CHIEF COMPLIANT: Follow-up of right breast cancer   INTERVAL HISTORY: [name] is a 54 year old with above-mentioned history of Right breast cancer who had recurrence of disease in 2012 and underwent surgery and is now on antiestrogen therapy with tamoxifen. She is tolerating tamoxifen extremely well with occasional hot flashes. She was on Effexor. She would like to come off of it. She denies any new lumps or nodules. She had breast reduction surgery in August 2015.  REVIEW OF SYSTEMS:   Constitutional: Denies fevers, chills or abnormal weight loss Eyes: Denies blurriness of vision Ears, nose, mouth, throat, and face: Denies mucositis or sore throat Respiratory: Denies cough, dyspnea or wheezes Cardiovascular: Denies palpitation, chest discomfort or lower extremity swelling Gastrointestinal:  Denies nausea, heartburn or change  in bowel habits Skin: Denies abnormal skin rashes Lymphatics: Denies new lymphadenopathy or easy bruising Neurological:Denies numbness, tingling or new weaknesses Behavioral/Psych: Mood is stable, no new changes  Breast:  denies any pain or lumps or nodules in either breasts All other systems were reviewed with the patient and are negative.  I have reviewed the past medical history, past surgical history, social history and family history with the patient and they are unchanged from previous note.  ALLERGIES:  has No Known Allergies.  MEDICATIONS:  Current Outpatient Prescriptions  Medication Sig Dispense Refill  . Acetaminophen 650 MG TABS Take 1 tablet (650 mg total) by mouth 3 (three) times daily as needed. 30 tablet 0  . amLODipine (NORVASC) 5 MG tablet Take 1 tablet (5 mg total) by mouth daily. 30 tablet 11  . Capsaicin 0.15 % LIQD Apply 1 application topically as needed (pain).    . carvedilol (COREG) 25 MG tablet Take 1 tablet (25 mg total) by mouth 2 (two) times daily with a meal. 60 tablet 0  . hydrochlorothiazide (HYDRODIURIL) 25 MG tablet TAKE ONE TABLET BY MOUTH ONCE DAILY 30 tablet 11  . metFORMIN (GLUCOPHAGE-XR) 500 MG 24 hr tablet TAKE ONE TABLET BY MOUTH ONCE DAILY WITH BREAKFAST. 90 tablet 3  . OVER THE COUNTER MEDICATION Apply 1 application topically as needed (pain). Menthol semisolid stick.    . potassium chloride SA (K-DUR,KLOR-CON) 20 MEQ tablet Take 2 tablets (40 mEq total) by mouth 2 (two) times daily. 12 tablet 0  . rosuvastatin (CRESTOR) 20 MG tablet Take 1 tablet (20 mg total) by mouth daily. 90 tablet 3  .  tamoxifen (NOLVADEX) 20 MG tablet TAKE ONE TABLET BY MOUTH ONCE DAILY 30 tablet 3  . traMADol (ULTRAM) 50 MG tablet Take 1 tablet (50 mg total) by mouth 2 (two) times daily as needed. 30 tablet 2  . venlafaxine XR (EFFEXOR-XR) 37.5 MG 24 hr capsule Take 1 capsule (37.5 mg total) by mouth daily. 30 capsule 1   No current facility-administered medications for  this visit.    PHYSICAL EXAMINATION: ECOG PERFORMANCE STATUS: 1 - Symptomatic but completely ambulatory  Filed Vitals:   05/19/14 0859  BP: 120/47  Pulse: 94  Temp: 99.1 F (37.3 C)  Resp: 18   Filed Weights   05/19/14 0859  Weight: 196 lb 3.2 oz (88.996 kg)    GENERAL:alert, no distress and comfortable SKIN: skin color, texture, turgor are normal, no rashes or significant lesions EYES: normal, Conjunctiva are pink and non-injected, sclera clear OROPHARYNX:no exudate, no erythema and lips, buccal mucosa, and tongue normal  NECK: supple, thyroid normal size, non-tender, without nodularity LYMPH:  no palpable lymphadenopathy in the cervical, axillary or inguinal LUNGS: clear to auscultation and percussion with normal breathing effort HEART: regular rate & rhythm and no murmurs and no lower extremity edema ABDOMEN:abdomen soft, non-tender and normal bowel sounds Musculoskeletal:no cyanosis of digits and no clubbing  NEURO: alert & oriented x 3 with fluent speech, no focal motor/sensory deficits BREAST: No palpable masses or nodules in either right or left breasts.  reduction surgery has happened in the left breast and right breast prosthesis and skin and axilla are without any abnormalities. No palpable axillary supraclavicular or infraclavicular adenopathy no breast tenderness or nipple discharge. (exam performed in the presence of a chaperone)  LABORATORY DATA:  I have reviewed the data as listed   Chemistry      Component Value Date/Time   NA 140 03/23/2014 0956   NA 142 11/24/2013 1504   K 3.5 03/23/2014 0956   K 3.2* 11/24/2013 1504   CL 102 03/23/2014 0956   CL 105 02/21/2012 0806   CO2 30 03/23/2014 0956   CO2 29 11/24/2013 1504   BUN 12 03/23/2014 0956   BUN 13.2 11/24/2013 1504   CREATININE 0.68 03/23/2014 0956   CREATININE 0.92 03/19/2014 0831   CREATININE 0.9 11/24/2013 1504      Component Value Date/Time   CALCIUM 9.6 03/23/2014 0956   CALCIUM 9.4  11/24/2013 1504   ALKPHOS 47 02/11/2014 0915   ALKPHOS 47 11/24/2013 1504   AST 38* 02/11/2014 0915   AST 44* 11/24/2013 1504   ALT 30 02/11/2014 0915   ALT 39 11/24/2013 1504   BILITOT 0.3 02/11/2014 0915   BILITOT 0.22 11/24/2013 1504       Lab Results  Component Value Date   WBC 9.9 05/19/2014   HGB 13.1 05/19/2014   HCT 40.3 05/19/2014   MCV 77.1* 05/19/2014   PLT 106* 05/19/2014   NEUTROABS 6.9* 05/19/2014   ASSESSMENT & PLAN:  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  Tamoxifen toxicities: 1. Hot flashes improved with Effexor 2. Plan is to continue tamoxifen for 10 years versus switching her to aromatase inhibitors once she reaches menopause  Breast cancer surveillance:  1. Breast exam 05/19/2014 is normal 2. Mammogram 04/25/2014 is normal on the left breast category B breast density  We will check iron studies along with deficits in his levels today. If she is menopausal we will switch her to anastrozole 1 mg daily. If she is not menopausal and she'll continue with tamoxifen for 10 years. Patient complains of hair loss.  Survivorship: Patient is exercising and had lost significant weight and stays active. Encouraged her to continue with what she is doing. Return to clinic in 6 months for follow-up with labs    Orders Placed This Encounter  Procedures  . Follicle stimulating hormone    Standing Status: Future     Number of Occurrences:      Standing Expiration Date: 06/23/2015  . Estradiol, Ultra Sens    Standing Status: Future     Number of Occurrences:      Standing Expiration  Date: 05/19/2015  . CBC with Differential    Standing Status: Future     Number of Occurrences:      Standing Expiration Date: 05/19/2015  . Comprehensive metabolic panel (Cmet) - [facility]    Standing Status: Future     Number of Occurrences:      Standing Expiration Date: 05/19/2015  . Follicle stimulating hormone    Standing Status: Future     Number of Occurrences:      Standing Expiration Date: 06/23/2015  . Estradiol, Ultra Sens    Standing Status: Future     Number of Occurrences:      Standing Expiration Date: 05/19/2015  . Iron and TIBC [facility]    Standing Status: Future     Number of Occurrences:      Standing Expiration Date: 05/19/2015  . Ferritin    Standing Status: Future     Number of Occurrences:      Standing Expiration Date: 05/19/2015   The patient has a good understanding of the overall plan. she agrees with it. She will call with any problems that may develop before her next visit here.   Rulon Eisenmenger, MD

## 2014-05-20 LAB — FOLLICLE STIMULATING HORMONE: FSH: 26.6 m[IU]/mL

## 2014-05-20 LAB — IRON AND TIBC CHCC
%SAT: 13 % — ABNORMAL LOW (ref 21–57)
IRON: 40 ug/dL — AB (ref 41–142)
TIBC: 316 ug/dL (ref 236–444)
UIBC: 276 ug/dL (ref 120–384)

## 2014-05-20 LAB — FERRITIN CHCC: Ferritin: 185 ng/ml (ref 9–269)

## 2014-05-21 ENCOUNTER — Emergency Department (HOSPITAL_COMMUNITY)
Admission: EM | Admit: 2014-05-21 | Discharge: 2014-05-21 | Disposition: A | Discharge status: Home / Self Care | Payer: BLUE CROSS/BLUE SHIELD | Attending: Emergency Medicine | Admitting: Emergency Medicine

## 2014-05-21 ENCOUNTER — Encounter (HOSPITAL_COMMUNITY): Payer: Self-pay | Admitting: Emergency Medicine

## 2014-05-21 DIAGNOSIS — E785 Hyperlipidemia, unspecified: Secondary | ICD-10-CM | POA: Diagnosis not present

## 2014-05-21 DIAGNOSIS — Z862 Personal history of diseases of the blood and blood-forming organs and certain disorders involving the immune mechanism: Secondary | ICD-10-CM | POA: Insufficient documentation

## 2014-05-21 DIAGNOSIS — H9202 Otalgia, left ear: Secondary | ICD-10-CM | POA: Insufficient documentation

## 2014-05-21 DIAGNOSIS — J069 Acute upper respiratory infection, unspecified: Secondary | ICD-10-CM | POA: Diagnosis not present

## 2014-05-21 DIAGNOSIS — Z8739 Personal history of other diseases of the musculoskeletal system and connective tissue: Secondary | ICD-10-CM | POA: Insufficient documentation

## 2014-05-21 DIAGNOSIS — Z79899 Other long term (current) drug therapy: Secondary | ICD-10-CM | POA: Diagnosis not present

## 2014-05-21 DIAGNOSIS — R05 Cough: Secondary | ICD-10-CM | POA: Diagnosis not present

## 2014-05-21 DIAGNOSIS — J029 Acute pharyngitis, unspecified: Secondary | ICD-10-CM | POA: Diagnosis present

## 2014-05-21 DIAGNOSIS — I10 Essential (primary) hypertension: Secondary | ICD-10-CM | POA: Diagnosis not present

## 2014-05-21 DIAGNOSIS — Z853 Personal history of malignant neoplasm of breast: Secondary | ICD-10-CM | POA: Insufficient documentation

## 2014-05-21 DIAGNOSIS — E119 Type 2 diabetes mellitus without complications: Secondary | ICD-10-CM | POA: Diagnosis not present

## 2014-05-21 MED ORDER — CETIRIZINE HCL 10 MG PO CHEW: 10.0000 mg | CHEWABLE_TABLET | Freq: Every day | ORAL | Status: DC

## 2014-05-21 NOTE — ED Provider Notes (Signed)
CSN: [id number]     Arrival date & time 05/21/14  4132 History   First MD Initiated Contact with Patient 05/21/14 [phone]     Chief Complaint  Patient presents with  . Sore Throat  . Otalgia     (Consider location/radiation/quality/duration/timing/severity/associated sxs/prior Treatment) HPI Comments: Pt comes in with c/o cough, sore throat and left ear pain times 2 days. No fever. Has taken tylenol for pain. States that her blood sugar has been baseline. Has history of breast ca but is no longer under treatment.  The history is provided by the patient.    Past Medical History  Diagnosis Date  . Hypertension   . Hyperlipidemia   . Thrombocytopenia   . Diabetes mellitus   . Arthritis   . Breast CA     breast CA- right breast  . Thyroid disease     parathyroid removed  . Heel spur     bilateral   Past Surgical History  Procedure Laterality Date  . Breast surgery  09/12/10    excision of skin mass - right breast, had another surgery to remove more Cancer cells  . Parathyroidectomy      partial?  . Abdominal hysterectomy      1996 per pt  . Inner ear surgery  06/01/13   Family History  Problem Relation Age of Onset  . Heart disease Mother   . Stroke Father   . Heart disease Brother   . Colon cancer Neg Hx   . Esophageal cancer Neg Hx   . Rectal cancer Neg Hx   . Stomach cancer Neg Hx    History  Substance Use Topics  . Smoking status: Never Smoker   . Smokeless tobacco: Never Used  . Alcohol Use: No   OB History    No data available     Review of Systems  All other systems reviewed and are negative.     Allergies  Review of patient's allergies indicates no known allergies.  Home Medications   Prior to Admission medications   Medication Sig Start Date End Date Taking? Authorizing Provider  Acetaminophen 650 MG TABS Take 1 tablet (650 mg total) by mouth 3 (three) times daily as needed. 04/26/12   Adlih Moreno-Coll, MD  amLODipine (NORVASC) 5 MG tablet Take  1 tablet (5 mg total) by mouth daily. 02/11/14   Hilton Sinclair, MD  Capsaicin 0.15 % LIQD Apply 1 application topically as needed (pain).    Historical Provider, MD  carvedilol (COREG) 25 MG tablet Take 1 tablet (25 mg total) by mouth 2 (two) times daily with a meal. 03/19/14   Davonna Belling, MD  hydrochlorothiazide (HYDRODIURIL) 25 MG tablet TAKE ONE TABLET BY MOUTH ONCE DAILY 02/11/14   Hilton Sinclair, MD  metFORMIN (GLUCOPHAGE-XR) 500 MG 24 hr tablet TAKE ONE TABLET BY MOUTH ONCE DAILY WITH BREAKFAST. 01/04/14   Hilton Sinclair, MD  OVER THE COUNTER MEDICATION Apply 1 application topically as needed (pain). Menthol semisolid stick.    Historical Provider, MD  potassium chloride SA (K-DUR,KLOR-CON) 20 MEQ tablet Take 2 tablets (40 mEq total) by mouth 2 (two) times daily. 03/19/14   Davonna Belling, MD  rosuvastatin (CRESTOR) 20 MG tablet Take 1 tablet (20 mg total) by mouth daily. 03/23/14   Hilton Sinclair, MD  tamoxifen (NOLVADEX) 20 MG tablet TAKE ONE TABLET BY MOUTH ONCE DAILY 02/09/14   Nicholas Lose, MD  traMADol (ULTRAM) 50 MG tablet Take 1 tablet (50 mg total) by mouth  2 (two) times daily as needed. 01/04/14   Hilton Sinclair, MD  venlafaxine XR (EFFEXOR-XR) 37.5 MG 24 hr capsule Take 1 capsule (37.5 mg total) by mouth daily. 05/16/14   Nicholas Lose, MD   BP 132/59 mmHg  Pulse 73  Temp(Src) 98.7 F (37.1 C) (Oral)  Resp 14  SpO2 97% Physical Exam  Constitutional: She appears well-developed and well-nourished.  HENT:  Head: Normocephalic and atraumatic.  Right Ear: External ear normal.  Left Ear: External ear normal.  Mouth/Throat: Posterior oropharyngeal edema and posterior oropharyngeal erythema present. No oropharyngeal exudate or tonsillar abscesses.  Cardiovascular: Normal rate and regular rhythm.   Pulmonary/Chest: Effort normal and breath sounds normal.  Musculoskeletal: Normal range of motion.  Nursing note and vitals reviewed.   ED Course   Procedures (including critical care time) Labs Review Labs Reviewed - No data to display  Imaging Review No results found.   EKG Interpretation None      MDM   Final diagnoses:  URI (upper respiratory infection)    Likely allergic in nature. Not febrile. Pt given zyrtec for symptoms and return precautions    Glendell Docker, NP 05/21/14 0277  Debby Freiberg, MD 05/21/14 2322

## 2014-05-21 NOTE — Discharge Instructions (Signed)
Upper Respiratory Infection, Adult An upper respiratory infection (URI) is also sometimes known as the common cold. The upper respiratory tract includes the nose, sinuses, throat, trachea, and bronchi. Bronchi are the airways leading to the lungs. Most people improve within 1 week, but symptoms can last up to 2 weeks. A residual cough may last even longer.  CAUSES Many different viruses can infect the tissues lining the upper respiratory tract. The tissues become irritated and inflamed and often become very moist. Mucus production is also common. A cold is contagious. You can easily spread the virus to others by oral contact. This includes kissing, sharing a glass, coughing, or sneezing. Touching your mouth or nose and then touching a surface, which is then touched by another person, can also spread the virus. SYMPTOMS  Symptoms typically develop 1 to 3 days after you come in contact with a cold virus. Symptoms vary from person to person. They may include:  Runny nose.  Sneezing.  Nasal congestion.  Sinus irritation.  Sore throat.  Loss of voice (laryngitis).  Cough.  Fatigue.  Muscle aches.  Loss of appetite.  Headache.  Low-grade fever. DIAGNOSIS  You might diagnose your own cold based on familiar symptoms, since most people get a cold 2 to 3 times a year. Your caregiver can confirm this based on your exam. Most importantly, your caregiver can check that your symptoms are not due to another disease such as strep throat, sinusitis, pneumonia, asthma, or epiglottitis. Blood tests, throat tests, and X-rays are not necessary to diagnose a common cold, but they may sometimes be helpful in excluding other more serious diseases. Your caregiver will decide if any further tests are required. RISKS AND COMPLICATIONS  You may be at risk for a more severe case of the common cold if you smoke cigarettes, have chronic heart disease (such as heart failure) or lung disease (such as asthma), or if  you have a weakened immune system. The very young and very old are also at risk for more serious infections. Bacterial sinusitis, middle ear infections, and bacterial pneumonia can complicate the common cold. The common cold can worsen asthma and chronic obstructive pulmonary disease (COPD). Sometimes, these complications can require emergency medical care and may be life-threatening. PREVENTION  The best way to protect against getting a cold is to practice good hygiene. Avoid oral or hand contact with people with cold symptoms. Wash your hands often if contact occurs. There is no clear evidence that vitamin C, vitamin E, echinacea, or exercise reduces the chance of developing a cold. However, it is always recommended to get plenty of rest and practice good nutrition. TREATMENT  Treatment is directed at relieving symptoms. There is no cure. Antibiotics are not effective, because the infection is caused by a virus, not by bacteria. Treatment may include:  Increased fluid intake. Sports drinks offer valuable electrolytes, sugars, and fluids.  Breathing heated mist or steam (vaporizer or shower).  Eating chicken soup or other clear broths, and maintaining good nutrition.  Getting plenty of rest.  Using gargles or lozenges for comfort.  Controlling fevers with ibuprofen or acetaminophen as directed by your caregiver.  Increasing usage of your inhaler if you have asthma. Zinc gel and zinc lozenges, taken in the first 24 hours of the common cold, can shorten the duration and lessen the severity of symptoms. Pain medicines may help with fever, muscle aches, and throat pain. A variety of non-prescription medicines are available to treat congestion and runny nose. Your caregiver   can make recommendations and may suggest nasal or lung inhalers for other symptoms.  HOME CARE INSTRUCTIONS   Only take over-the-counter or prescription medicines for pain, discomfort, or fever as directed by your  caregiver.  Use a warm mist humidifier or inhale steam from a shower to increase air moisture. This may keep secretions moist and make it easier to breathe.  Drink enough water and fluids to keep your urine clear or pale yellow.  Rest as needed.  Return to work when your temperature has returned to normal or as your caregiver advises. You may need to stay home longer to avoid infecting others. You can also use a face mask and careful hand washing to prevent spread of the virus. SEEK MEDICAL CARE IF:   After the first few days, you feel you are getting worse rather than better.  You need your caregiver's advice about medicines to control symptoms.  You develop chills, worsening shortness of breath, or brown or red sputum. These may be signs of pneumonia.  You develop yellow or brown nasal discharge or pain in the face, especially when you bend forward. These may be signs of sinusitis.  You develop a fever, swollen neck glands, pain with swallowing, or white areas in the back of your throat. These may be signs of strep throat. SEEK IMMEDIATE MEDICAL CARE IF:   You have a fever.  You develop severe or persistent headache, ear pain, sinus pain, or chest pain.  You develop wheezing, a prolonged cough, cough up blood, or have a change in your usual mucus (if you have chronic lung disease).  You develop sore muscles or a stiff neck. Document Released: 07/17/2000 Document Revised: 04/15/2011 Document Reviewed: 04/28/2013 [employer] Patient Information 2015 [employer]. This information is not intended to replace advice given to you by your health care provider. Make sure you discuss any questions you have with your health care provider.  

## 2014-05-21 NOTE — ED Notes (Signed)
Pt c/o sore throat and ear pain starting Monday. Pt took tylenol w/o relief.

## 2014-05-21 NOTE — ED Notes (Signed)
Awake. Verbally responsive. A/O x4. Resp even and unlabored. Auscultated clear breath sounds bil and throughout. No audible adventitious breath sounds noted. Pt reported occ productive cough to clear sputum. ABC's intact.

## 2014-05-24 ENCOUNTER — Emergency Department (INDEPENDENT_AMBULATORY_CARE_PROVIDER_SITE_OTHER)
Admission: EM | Admit: 2014-05-24 | Discharge: 2014-05-24 | Disposition: A | Discharge status: Home / Self Care | Payer: BLUE CROSS/BLUE SHIELD | Source: Home / Self Care | Attending: Family Medicine | Admitting: Family Medicine

## 2014-05-24 ENCOUNTER — Encounter (HOSPITAL_COMMUNITY): Payer: Self-pay | Admitting: Emergency Medicine

## 2014-05-24 DIAGNOSIS — J301 Allergic rhinitis due to pollen: Secondary | ICD-10-CM | POA: Diagnosis not present

## 2014-05-24 LAB — POCT RAPID STREP A: Streptococcus, Group A Screen (Direct): NEGATIVE

## 2014-05-24 LAB — ESTRADIOL, ULTRA SENS: Estradiol, Ultra Sensitive: 13 pg/mL

## 2014-05-24 MED ORDER — BENZONATATE 100 MG PO CAPS: 100.0000 mg | ORAL_CAPSULE | Freq: Three times a day (TID) | ORAL | Status: DC | PRN

## 2014-05-24 MED ORDER — IPRATROPIUM BROMIDE 0.06 % NA SOLN: 2.0000 | Freq: Four times a day (QID) | NASAL | Status: DC

## 2014-05-24 MED ORDER — LEVOCETIRIZINE DIHYDROCHLORIDE 5 MG PO TABS: 5.0000 mg | ORAL_TABLET | Freq: Every evening | ORAL | Status: DC

## 2014-05-24 NOTE — ED Notes (Signed)
C/o  Sore throat.  Fever.  Productive cough with yellow sputum.  Runny nose.   Symptoms present x 2 weeks.  Pt has had no relief with otc meds.  Denies vomiting and diarrhea.

## 2014-05-24 NOTE — ED Provider Notes (Signed)
CSN: [id number]     Arrival date & time 05/24/14  0802 History   First MD Initiated Contact with Patient 05/24/14 0825     Chief Complaint  Patient presents with  . URI  . Sore Throat   (Consider location/radiation/quality/duration/timing/severity/associated sxs/prior Treatment) HPI Comments: Patient presents with several weeks of rhinorrhea, post nasal drainage, nasal congestion, cough with 2 days of subjective fever and sore throat. Was recently evaluated at [facility] and prescribed Zyrtec for presumed seasonal allergies. States she has been taking this medication with little improvement. PCP: [facility] Nonsmoker Unemployed   Patient is a 54 y.o. female presenting with URI and pharyngitis.  URI Sore Throat    Past Medical History  Diagnosis Date  . Hypertension   . Hyperlipidemia   . Thrombocytopenia   . Diabetes mellitus   . Arthritis   . Breast CA     breast CA- right breast  . Thyroid disease     parathyroid removed  . Heel spur     bilateral   Past Surgical History  Procedure Laterality Date  . Breast surgery  09/12/10    excision of skin mass - right breast, had another surgery to remove more Cancer cells  . Parathyroidectomy      partial?  . Abdominal hysterectomy      1996 per pt  . Inner ear surgery  06/01/13   Family History  Problem Relation Age of Onset  . Heart disease Mother   . Stroke Father   . Heart disease Brother   . Colon cancer Neg Hx   . Esophageal cancer Neg Hx   . Rectal cancer Neg Hx   . Stomach cancer Neg Hx    History  Substance Use Topics  . Smoking status: Never Smoker   . Smokeless tobacco: Never Used  . Alcohol Use: No   OB History    No data available     Review of Systems  All other systems reviewed and are negative.   Allergies  Review of patient's allergies indicates no known allergies.  Home Medications   Prior to Admission medications   Medication Sig Start Date End Date Taking? Authorizing Provider  amLODipine  (NORVASC) 5 MG tablet Take 1 tablet (5 mg total) by mouth daily. 02/11/14  Yes Hilton Sinclair, MD  carvedilol (COREG) 25 MG tablet Take 1 tablet (25 mg total) by mouth 2 (two) times daily with a meal. 03/19/14  Yes Davonna Belling, MD  hydrochlorothiazide (HYDRODIURIL) 25 MG tablet TAKE ONE TABLET BY MOUTH ONCE DAILY 02/11/14  Yes Hilton Sinclair, MD  metFORMIN (GLUCOPHAGE-XR) 500 MG 24 hr tablet TAKE ONE TABLET BY MOUTH ONCE DAILY WITH BREAKFAST. 01/04/14  Yes Hilton Sinclair, MD  OVER THE COUNTER MEDICATION Apply 1 application topically as needed (pain). Menthol semisolid stick.   Yes Historical Provider, MD  rosuvastatin (CRESTOR) 20 MG tablet Take 1 tablet (20 mg total) by mouth daily. 03/23/14  Yes Hilton Sinclair, MD  venlafaxine XR (EFFEXOR-XR) 37.5 MG 24 hr capsule Take 1 capsule (37.5 mg total) by mouth daily. 05/16/14  Yes Nicholas Lose, MD  Acetaminophen 650 MG TABS Take 1 tablet (650 mg total) by mouth 3 (three) times daily as needed. 04/26/12   Adlih Moreno-Coll, MD  benzonatate (TESSALON) 100 MG capsule Take 1 capsule (100 mg total) by mouth 3 (three) times daily as needed for cough. 05/24/14   Audelia Hives Rayona Sardinha, PA  Capsaicin 0.15 % LIQD Apply 1 application topically as needed (  pain).    Historical Provider, MD  ipratropium (ATROVENT) 0.06 % nasal spray Place 2 sprays into both nostrils 4 (four) times daily. As needed for nasal congestion 05/24/14   Lutricia Feil, PA  levocetirizine (XYZAL) 5 MG tablet Take 1 tablet (5 mg total) by mouth every evening. 05/24/14   Audelia Hives Haylee Mcanany, PA  potassium chloride SA (K-DUR,KLOR-CON) 20 MEQ tablet Take 2 tablets (40 mEq total) by mouth 2 (two) times daily. 03/19/14   Davonna Belling, MD  tamoxifen (NOLVADEX) 20 MG tablet TAKE ONE TABLET BY MOUTH ONCE DAILY 02/09/14   Nicholas Lose, MD  traMADol (ULTRAM) 50 MG tablet Take 1 tablet (50 mg total) by mouth 2 (two) times daily as needed. 01/04/14   Hilton Sinclair, MD    BP 120/56 mmHg  Pulse 81  Temp(Src) 99.5 F (37.5 C) (Oral)  Resp 16  SpO2 96% Physical Exam  Constitutional: She is oriented to person, place, and time. She appears well-developed and well-nourished. No distress.  HENT:  Head: Normocephalic and atraumatic.  Right Ear: Hearing, tympanic membrane, external ear and ear canal normal.  Left Ear: Hearing, tympanic membrane, external ear and ear canal normal.  Nose: Mucosal edema and rhinorrhea present.  Mouth/Throat: Uvula is midline, oropharynx is clear and moist and mucous membranes are normal. No oral lesions. No trismus in the jaw. No uvula swelling.  Eyes: Conjunctivae are normal. No scleral icterus.  Neck: Normal range of motion. Neck supple.  Cardiovascular: Normal rate, regular rhythm and normal heart sounds.   Pulmonary/Chest: Effort normal and breath sounds normal. No respiratory distress. She has no wheezes.  Musculoskeletal: Normal range of motion.  Lymphadenopathy:    She has no cervical adenopathy.  Neurological: She is alert and oriented to person, place, and time.  Skin: Skin is warm and dry.  Psychiatric: She has a normal mood and affect. Her behavior is normal.  Nursing note and vitals reviewed.   ED Course  Procedures (including critical care time) Labs Review Labs Reviewed  POCT RAPID STREP A ([facility] C[facility] ONLY)    Imaging Review No results found.   MDM   1. Hayfever   D/C Zyrtec Start Xyzal, Atrovent and tessalon.  Rapid strep negative PCP follow up if no improvement.    Lutricia Feil, [place] 05/24/14 [phone]

## 2014-05-24 NOTE — Discharge Instructions (Signed)
Your rapid strep test was negative. Swab will be held for three day culture and should the results indicate the need for additional treatment, you will be notified by phone. Please try using medications prescribed today for your symptoms. You may discontinue using Zyrtec as it sounds to me like this medication did not help much. If your symptoms do not improve, please follow up with your doctor at [facility].  Allergic Rhinitis Allergic rhinitis is when the mucous membranes in the nose respond to allergens. Allergens are particles in the air that cause your body to have an allergic reaction. This causes you to release allergic antibodies. Through a chain of events, these eventually cause you to release histamine into the blood stream. Although meant to protect the body, it is this release of histamine that causes your discomfort, such as frequent sneezing, congestion, and an itchy, runny nose.  CAUSES  Seasonal allergic rhinitis (hay fever) is caused by pollen allergens that may come from grasses, trees, and weeds. Year-round allergic rhinitis (perennial allergic rhinitis) is caused by allergens such as house dust mites, pet dander, and mold spores.  SYMPTOMS   Nasal stuffiness (congestion).  Itchy, runny nose with sneezing and tearing of the eyes. DIAGNOSIS  Your health care provider can help you determine the allergen or allergens that trigger your symptoms. If you and your health care provider are unable to determine the allergen, skin or blood testing may be used. TREATMENT  Allergic rhinitis does not have a cure, but it can be controlled by:  Medicines and allergy shots (immunotherapy).  Avoiding the allergen. Hay fever may often be treated with antihistamines in pill or nasal spray forms. Antihistamines block the effects of histamine. There are over-the-counter medicines that may help with nasal congestion and swelling around the eyes. Check with your health care provider  before taking or giving this medicine.  If avoiding the allergen or the medicine prescribed do not work, there are many new medicines your health care provider can prescribe. Stronger medicine may be used if initial measures are ineffective. Desensitizing injections can be used if medicine and avoidance does not work. Desensitization is when a patient is given ongoing shots until the body becomes less sensitive to the allergen. Make sure you follow up with your health care provider if problems continue. HOME CARE INSTRUCTIONS It is not possible to completely avoid allergens, but you can reduce your symptoms by taking steps to limit your exposure to them. It helps to know exactly what you are allergic to so that you can avoid your specific triggers. SEEK MEDICAL CARE IF:   You have a fever.  You develop a cough that does not stop easily (persistent).  You have shortness of breath.  You start wheezing.  Symptoms interfere with normal daily activities. Document Released: 10/16/2000 Document Revised: 01/26/2013 Document Reviewed: 09/28/2012 [facility] Patient Information 2015 [place], [place]. This information is not intended to replace advice given to you by your health care provider. Make sure you discuss any questions you have with your health care provider.  Hay Fever Hay fever is an allergic reaction to particles in the air. It cannot be passed from person to person. It cannot be cured, but it can be controlled. CAUSES  Hay fever is caused by something that triggers an allergic reaction (allergens). The following are examples of allergens:  Ragweed.  Feathers.  Animal dander.  Grass and tree pollens.  Cigarette smoke.  House dust.  Pollution. SYMPTOMS   Sneezing.  Runny or stuffy nose.  Tearing eyes.  Itchy eyes, nose, mouth, throat, skin, or other area.  Sore throat.  Headache.  Decreased sense of smell or taste. DIAGNOSIS Your caregiver will perform a physical exam  and ask questions about the symptoms you are having.Allergy testing may be done to determine exactly what triggers your hay fever.  TREATMENT   Over-the-counter medicines may help symptoms. These include:  Antihistamines.  Decongestants. These may help with nasal congestion.  Your caregiver may prescribe medicines if over-the-counter medicines do not work.  Some people benefit from allergy shots when other medicines are not helpful. HOME CARE INSTRUCTIONS   Avoid the allergen that is causing your symptoms, if possible.  Take all medicine as told by your caregiver. SEEK MEDICAL CARE IF:   You have severe allergy symptoms and your current medicines are not helping.  Your treatment was working at one time, but you are now experiencing symptoms.  You have sinus congestion and pressure.  You develop a fever or headache.  You have thick nasal discharge.  You have asthma and have a worsening cough and wheezing. SEEK IMMEDIATE MEDICAL CARE IF:   You have swelling of your tongue or lips.  You have trouble breathing.  You feel lightheaded or like you are going to faint.  You have cold sweats.  You have a fever. Document Released: 01/21/2005 Document Revised: 04/15/2011 Document Reviewed: 04/18/2010 [facility] Patient Information 2015 [place], [place]. This information is not intended to replace advice given to you by your health care provider. Make sure you discuss any questions you have with your health care provider.

## 2014-05-25 ENCOUNTER — Telehealth: Payer: Self-pay

## 2014-05-25 NOTE — Telephone Encounter (Signed)
Per Dr. Lindi Adie, let pt know her iron studies are fine, she is borderline menopausal so will need to stay on tamoxifen.  Pt voiced understanding and confirmed that she will attempt to wean herself off the effexor for the hot flashes.  If hot flashes return, pt to resume effexor.

## 2014-05-27 ENCOUNTER — Ambulatory Visit (INDEPENDENT_AMBULATORY_CARE_PROVIDER_SITE_OTHER): Payer: BLUE CROSS/BLUE SHIELD | Admitting: Family Medicine

## 2014-05-27 ENCOUNTER — Encounter: Payer: Self-pay | Admitting: Family Medicine

## 2014-05-27 VITALS — BP 128/79 | HR 76 | Temp 98.6°F | Wt 196.8 lb

## 2014-05-27 DIAGNOSIS — H9202 Otalgia, left ear: Secondary | ICD-10-CM | POA: Diagnosis not present

## 2014-05-27 DIAGNOSIS — H9209 Otalgia, unspecified ear: Secondary | ICD-10-CM | POA: Insufficient documentation

## 2014-05-27 LAB — CULTURE, GROUP A STREP

## 2014-05-27 MED ORDER — FLUTICASONE PROPIONATE 50 MCG/ACT NA SUSP: 2.0000 | Freq: Every day | NASAL | Status: DC

## 2014-05-27 NOTE — Assessment & Plan Note (Addendum)
Likely secondary to eustachian dysfunction given constellation of URI symptoms and effusion noted behind TM. No evidence for bacterial infection. Will add flonase to regimen. Patient instructed to return in 1 week if not improving.  Of note, patient asked for pain medications. Not a candidate for NSAIDs due to low platelets. I offered high dose tylenol, however patient refused and specifically asked for tramadol. I told patient that prescribing tramadol for her condition was not good medical practice. Patient acknowledged this, however was visibly angry when leaving the office.

## 2014-05-27 NOTE — Patient Instructions (Signed)
Thank you for coming to the clinic today. It was nice seeing you.  For your ear and throat pain, it is likely coming from allergies or a mild cold. Continue to take your allergy pill and the atrovent nasal spray. We will start you on flonase today. If your symptoms are not improving in 1 week, please come back for further evaluation.

## 2014-05-27 NOTE — Progress Notes (Addendum)
[name] is a 54 y.o. female who presents to the [facility] today for same day appointment for throat and ear pain. Her concerns today include:  HPI:  Sore Throat/Ear Pain Patient with left ear pain x1 week and sore throat x2 weeks. Went to urgent care a few days ago, had negative strep test, was started on tessalon, xyrtec, and atrovent nasal spray. States that cough has mildly improved but still has ear pain. Has tried tylenol for pain without significant relieve. Cough has improved. No recent sick contacts.   Reports subjective fevers at home but no documented temperatures. No ear drainage. No swollen glands.   ROS: As per HPI, otherwise all systems reviewed and are negative.  Past Medical History - Reviewed and updated Patient Active Problem List   Diagnosis Date Noted  . Ear pain 05/27/2014  . Tachycardia 03/16/2014  . Mitral regurgitation 02/18/2014  . Hip pain, bilateral 01/07/2014  . 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 Transaminitis 11/13/2012  . Abdominal spasms 05/01/2012  . Personal history of colonic polyps 04/17/2012  . Bilateral foot pain 02/21/2012  . Fatigue 11/12/2011  . Health care maintenance 11/12/2011  . Plantar fasciitis 06/12/2011  . Hyperlipidemia 04/04/2011  . Bilateral lower extremity edema 04/04/2011  . Prediabetes 06/07/2010  . Cancer of central portion of right female breast 10/04/2009  . Idiopathic thrombocytopenic purpura (ITP) 06/22/2009  . Essential hypertension 06/22/2009  . HYPERPARATHYROIDISM, HX OF 06/22/2009  . Avitaminosis D 09/01/2008    Medications- reviewed and updated Current Outpatient Prescriptions  Medication Sig Dispense Refill  . Acetaminophen 650 MG TABS Take 1 tablet (650 mg total) by mouth 3 (three) times daily as needed. 30 tablet 0  . amLODipine (NORVASC) 5 MG tablet Take 1 tablet (5 mg total) by mouth daily. 30 tablet 11  . benzonatate (TESSALON) 100 MG capsule Take 1 capsule (100 mg total) by mouth 3 (three) times daily  as needed for cough. 21 capsule 0  . Capsaicin 0.15 % LIQD Apply 1 application topically as needed (pain).    . carvedilol (COREG) 25 MG tablet Take 1 tablet (25 mg total) by mouth 2 (two) times daily with a meal. 60 tablet 0  . fluticasone (FLONASE) 50 MCG/ACT nasal spray Place 2 sprays into both nostrils daily. 16 g 6  . hydrochlorothiazide (HYDRODIURIL) 25 MG tablet TAKE ONE TABLET BY MOUTH ONCE DAILY 30 tablet 11  . ipratropium (ATROVENT) 0.06 % nasal spray Place 2 sprays into both nostrils 4 (four) times daily. As needed for nasal congestion 15 mL 1  . levocetirizine (XYZAL) 5 MG tablet Take 1 tablet (5 mg total) by mouth every evening. 30 tablet 1  . metFORMIN (GLUCOPHAGE-XR) 500 MG 24 hr tablet TAKE ONE TABLET BY MOUTH ONCE DAILY WITH BREAKFAST. 90 tablet 3  . OVER THE COUNTER MEDICATION Apply 1 application topically as needed (pain). Menthol semisolid stick.    . potassium chloride SA (K-DUR,KLOR-CON) 20 MEQ tablet Take 2 tablets (40 mEq total) by mouth 2 (two) times daily. 12 tablet 0  . rosuvastatin (CRESTOR) 20 MG tablet Take 1 tablet (20 mg total) by mouth daily. 90 tablet 3  . tamoxifen (NOLVADEX) 20 MG tablet TAKE ONE TABLET BY MOUTH ONCE DAILY 30 tablet 3  . traMADol (ULTRAM) 50 MG tablet Take 1 tablet (50 mg total) by mouth 2 (two) times daily as needed. 30 tablet 2  . venlafaxine XR (EFFEXOR-XR) 37.5 MG 24 hr capsule Take 1 capsule (37.5 mg total) by mouth daily.  30 capsule 1  . [DISCONTINUED] cetirizine (ZYRTEC) 10 MG chewable tablet Chew 1 tablet (10 mg total) by mouth daily. 15 tablet 0   No current facility-administered medications for this visit.    Objective: Physical Exam: BP 128/79 mmHg  Pulse 76  Temp(Src) 98.6 F (37 C) (Oral)  Wt 196 lb 12.8 oz (89.268 kg)  Gen: NAD, resting comfortably HEENT: Right TM clear with effusion noted. Left ear canal with cerumen and skin tag noted, TM able to be visualized after removal of a small amount of cerumen. TM clear with mild  effusion no erythema or exudates. OP mildly erythematous with no exudates.  CV: RRR with no murmurs appreciated Lungs: NWOB, CTAB with no crackles, wheezes, or rhonchi Abdomen: Normal bowel sounds present. Soft, Nontender, Nondistended. Ext: no edema Skin: warm, dry Neuro: grossly normal, moves all extremities  A/P: See problem list  Ear pain Likely secondary to eustachian dysfunction given constellation of URI symptoms and effusion noted behind TM. No evidence for bacterial infection. Will add flonase to regimen. Patient instructed to return in 1 week if not improving.  Of note, patient asked for pain medications. Not a candidate for NSAIDs due to low platelets. I offered high dose tylenol, however patient refused and specifically asked for tramadol. I told patient that prescribing tramadol for her condition was not good medical practice. Patient acknowledged this, however was visibly angry when leaving the office.       Meds ordered this encounter  Medications  . fluticasone (FLONASE) 50 MCG/ACT nasal spray    Sig: Place 2 sprays into both nostrils daily.    Dispense:  16 g    Refill:  [place]. Jerline Pain, [place] Resident PGY-1 05/27/2014 7:39 PM

## 2014-05-30 NOTE — ED Notes (Signed)
Throat culture: Strep beta hemolytic not group A.  Message sent to [facility] PA. 05/30/2014 Truman Hayward wrote back to call for clinical improvement. I called pt. Pt. verified x 2 and given result.  Pt. said she is fine now, no symptoms. I told the pt., no treatment is needed. I answered her questions. PA notified. 05/31/2014

## 2014-05-31 ENCOUNTER — Telehealth (HOSPITAL_COMMUNITY): Payer: Self-pay | Admitting: *Deleted

## 2014-06-06 DIAGNOSIS — Z6834 Body mass index (BMI) 34.0-34.9, adult: Secondary | ICD-10-CM | POA: Diagnosis not present

## 2014-06-06 DIAGNOSIS — E119 Type 2 diabetes mellitus without complications: Secondary | ICD-10-CM | POA: Diagnosis not present

## 2014-06-06 DIAGNOSIS — C50811 Malignant neoplasm of overlapping sites of right female breast: Secondary | ICD-10-CM | POA: Diagnosis not present

## 2014-06-06 DIAGNOSIS — I10 Essential (primary) hypertension: Secondary | ICD-10-CM | POA: Diagnosis not present

## 2014-06-06 DIAGNOSIS — C50911 Malignant neoplasm of unspecified site of right female breast: Secondary | ICD-10-CM | POA: Diagnosis not present

## 2014-06-10 ENCOUNTER — Ambulatory Visit (INDEPENDENT_AMBULATORY_CARE_PROVIDER_SITE_OTHER): Payer: Medicare Other | Admitting: Family Medicine

## 2014-06-10 ENCOUNTER — Encounter: Payer: Self-pay | Admitting: Family Medicine

## 2014-06-10 VITALS — BP 136/80 | HR 72 | Temp 98.6°F | Ht 64.0 in | Wt 197.0 lb

## 2014-06-10 DIAGNOSIS — H9203 Otalgia, bilateral: Secondary | ICD-10-CM

## 2014-06-10 DIAGNOSIS — I10 Essential (primary) hypertension: Secondary | ICD-10-CM | POA: Diagnosis not present

## 2014-06-10 DIAGNOSIS — E785 Hyperlipidemia, unspecified: Secondary | ICD-10-CM | POA: Diagnosis not present

## 2014-06-10 NOTE — Assessment & Plan Note (Signed)
Continued ear pain and sore throat per patient, seen in urgent care with negative rapid strep. No fever/chills and is breathing and hydrating well. 2 weeks sore throat. - Recommended daily use of flonase and warm fluids.

## 2014-06-10 NOTE — Assessment & Plan Note (Signed)
Reports intolerance to atorvastatin in the past. Prefers trial of restarting crestor at this time but if starts having intolerable myalgias, will notify me so we can restart pravastatin.

## 2014-06-10 NOTE — Assessment & Plan Note (Signed)
Control: 120/60s at home, good control, occasional dizziness. -Medications: Take coreg 1/2 tab BID due to occasional dizziness, continue HCTZ 25mg  daily and norvasc 5mg  daily.  -Continue elevating legs, try compression socks; if no improvement, decrease or d/c norvasc. -Renal function: normal 05/2014. -Diet/exercise: Stay active; continue to monitor Salt intake. - F/u: 3 months or sooner PRN

## 2014-06-10 NOTE — Patient Instructions (Addendum)
Good to see you.  For now, cut your carvedilol in half and take 1/2 tablet twice daily. Continue norvasc and HCTZ. Drink more fluid to prevent dizziness. Wear compression socks to help with ankle swelling. If it continues despite these, we can consider lowering or stopping norvasc. Continue checking BP every couple days and if ever dizzy. Restart crestor 1/2 tab in evenings and notify if having muscle aches that are not tolerable or worsening.  Use your flonase daily and drink warm fluids.  Follow up in 3 months or sooner if any issues.  Best,  Hilton Sinclair, MD

## 2014-06-10 NOTE — Progress Notes (Signed)
Patient ID: [name], female   DOB: August 13, 1960, 54 y.o.   MRN: [id number] Subjective:   CC: F/u HTN and sore throat  HPI:   Follow-up hypertension Blood pressures at home: 120/60s Medications: norvasc 5mg , coreg 25mg  BID, HCTZ 25mg . Side effects: increased leg swelling, initially stopped taking second coreg dose due to diziness occasionally, now forgets. End-organ symptoms: CP/SOB? None.  Renal function: Normal checked last month. Diet/exercise: Cutting back on salt. Tobacco use: never smoker. Echo: 02/2014: mild mitral regurgitation and left atrial enlargement, grade 1 diastolic dysfunction; check every 3-5 years or sooner if symptoms get worse.   Hyperlipidemia Patient was on pravastatin in the past which did not cause myalgias. She started having some myalgias with crestor and stopped this medication.    Review of Systems - Per HPI.   PMH - Bilateral LE edema, h/o breast cancer, HTN, hip pain,  HLD, h/o colon polyps, prediabetes, tachycardia, transaminitis  Smoking status: Never smoker    Objective:  Physical Exam BP 136/80 mmHg  Pulse 72  Temp(Src) 98.6 F (37 C) (Oral)  Ht 5\' 4"  (1.626 m)  Wt 197 lb (89.359 kg)  BMI 33.80 kg/m2 GEN: NAD CV: RRR, no m/r/g EXTR: No notable LE edema or calf tenderness PULM: CTAB, normal effort HEENT: AT/[place], neck supple, sclera clear, o/p with MMM    Assessment:     [name] is a 54 y.o. female here for f/u HTN and HLD.    Plan:     # See problem list and after visit summary for problem-specific plans.   # Health Maintenance: Not discussed  Follow-up: Follow up in 3 months for f/u HTN.   Hilton Sinclair, MD [place]

## 2014-06-13 ENCOUNTER — Telehealth: Payer: Self-pay | Admitting: Family Medicine

## 2014-06-13 DIAGNOSIS — J020 Streptococcal pharyngitis: Secondary | ICD-10-CM

## 2014-06-13 MED ORDER — PENICILLIN V POTASSIUM 500 MG PO TABS: 500.0000 mg | ORAL_TABLET | Freq: Four times a day (QID) | ORAL | Status: DC

## 2014-06-13 NOTE — Telephone Encounter (Signed)
Pt advised as directed below and verbalized understanding. Asael Pann, CMA. 

## 2014-06-13 NOTE — Telephone Encounter (Signed)
Attempted to call patient but had to leave message for her to call us back. When she calls, please have [facility] Team or another provider relay the following (as I am post-call):   Although no group A strep grew out, there is actually other strep infection which we often do not treat but since she still had symptoms, I am changing my mind and will treat with 10 days of penicillin V 500mg  4 times daily. I am sending to [facility] on [address], and the 250mg  tabs are $10 if her insurance does not cover the 500mg  tablets. I put a note to pharmacy to dispense whichever patient prefers due to cost/ease of administration.  She should still follow up if no improvement and can call with any questions.  Hilton Sinclair, MD

## 2014-06-29 ENCOUNTER — Ambulatory Visit
Admission: RE | Admit: 2014-06-29 | Discharge: 2014-06-29 | Disposition: A | Discharge status: Home / Self Care | Payer: BLUE CROSS/BLUE SHIELD | Source: Ambulatory Visit | Attending: Family Medicine | Admitting: Family Medicine

## 2014-06-29 ENCOUNTER — Encounter: Payer: Self-pay | Admitting: Family Medicine

## 2014-06-29 ENCOUNTER — Telehealth: Payer: Self-pay | Admitting: Family Medicine

## 2014-06-29 ENCOUNTER — Ambulatory Visit (INDEPENDENT_AMBULATORY_CARE_PROVIDER_SITE_OTHER): Payer: BLUE CROSS/BLUE SHIELD | Admitting: Family Medicine

## 2014-06-29 VITALS — BP 134/60 | HR 88 | Temp 98.5°F | Ht 63.0 in | Wt 199.0 lb

## 2014-06-29 DIAGNOSIS — J209 Acute bronchitis, unspecified: Secondary | ICD-10-CM

## 2014-06-29 DIAGNOSIS — R062 Wheezing: Secondary | ICD-10-CM

## 2014-06-29 NOTE — Assessment & Plan Note (Addendum)
Patient with likely bronchitis following her episode of pharyngitis. No asthma or smoking history making asthma and COPD unlikely causes of this cough and wheezing. Wheezing is minimal on exam. No dyspnea and has normal O2 sat which are reassuring. Discussed treating wheezing with inhaler, though unsure that this will be of any benefit with lack of dyspnea. Patient agreed with this and felt that she did not need an inhaler at this time. Will obtain CXR to evaluate for any pulmonary processes. Will continue flonase and penicillin. Given return precautions.

## 2014-06-29 NOTE — Progress Notes (Signed)
Patient ID: Adan Beal, female   DOB: April 21, 1960, 54 y.o.   MRN: [id number]  Tommi Rumps, MD Phone: [phone]  Sarit Sparano is a 54 y.o. female who presents today for same day appointment.   Patient presents for wheezing. Notes she had recent history of URI and non-GAS pharyngitis. Had congestion, ear fullness, and rhinorrhea, though this has resolved. Continues to have cough productive of clear sputum at nights. Notes last night had mild wheezing. Has not had any dyspnea. No fevers. No other current symptoms. No prior history of smoking. Has been taking penicillin for non-GAS pharyngitis. Using flonase for prior nasal symptoms.   PMH: nonsmoker.   ROS: Per HPI   Physical Exam Filed Vitals:   06/29/14 1339  BP: 134/60  Pulse: 88  Temp: 98.5 F (36.9 C)    Gen: Well NAD HEENT: PERRL,  MMM, no OP erythema or exudate, no cervical LAD Lungs: minimal expiratory wheezes, no crackles or rales, Nl WOB Heart: RRR, no murmur appreciated  Exts: Non edematous BL  LE, warm and well perfused.    Assessment/Plan: Please see individual problem list.  Tommi Rumps, MD [place] PGY-3

## 2014-06-29 NOTE — Patient Instructions (Signed)
Nice to see you. Please go get the chest x-ray. You likely have a bronchitis, though will get the x-ray to ensure no other issues.  If you develop shortness of breath, worsening chest pain, fever, productive cough, nausea, or feel poorly please seek medical attention.  If this does not improve in the next 1-2 days please let us know.

## 2014-06-29 NOTE — Telephone Encounter (Signed)
Informed of normal x-ray. Reminded patient of return precautions.

## 2014-07-15 ENCOUNTER — Telehealth: Payer: Self-pay

## 2014-07-15 NOTE — Telephone Encounter (Signed)
Order faxed to [facility].  Sent to scan. 

## 2014-07-22 ENCOUNTER — Telehealth: Payer: Self-pay

## 2014-07-22 NOTE — Telephone Encounter (Signed)
Order faxed to [facility].  Sent to scan. 

## 2014-08-05 ENCOUNTER — Other Ambulatory Visit: Payer: Self-pay | Admitting: *Deleted

## 2014-08-05 DIAGNOSIS — M159 Polyosteoarthritis, unspecified: Secondary | ICD-10-CM

## 2014-08-05 DIAGNOSIS — M150 Primary generalized (osteo)arthritis: Principal | ICD-10-CM

## 2014-08-09 MED ORDER — TRAMADOL HCL 50 MG PO TABS
50.0000 mg | ORAL_TABLET | Freq: Two times a day (BID) | ORAL | Status: DC | PRN
Start: 2014-08-09 — End: 2014-12-27

## 2014-08-09 NOTE — Telephone Encounter (Signed)
Pt informed of RX being up front. Katharina Caper, Arlinda Barcelona D, [place]

## 2014-08-15 ENCOUNTER — Ambulatory Visit (INDEPENDENT_AMBULATORY_CARE_PROVIDER_SITE_OTHER): Payer: BLUE CROSS/BLUE SHIELD | Admitting: Family Medicine

## 2014-08-15 ENCOUNTER — Encounter: Payer: Self-pay | Admitting: Family Medicine

## 2014-08-15 VITALS — BP 130/60 | HR 77 | Temp 98.4°F | Wt 198.6 lb

## 2014-08-15 DIAGNOSIS — I10 Essential (primary) hypertension: Secondary | ICD-10-CM | POA: Diagnosis not present

## 2014-08-15 DIAGNOSIS — D693 Immune thrombocytopenic purpura: Secondary | ICD-10-CM

## 2014-08-15 NOTE — Assessment & Plan Note (Addendum)
BP well controlled today.  Pt endorses abdominal and lower extremity swelling that is not appreciated on exam - Advised taking Coreg BID; currently only taking 25 mg daily, if develops symptoms of hypotension as before then decrease to 12.5 mg twice a day - Advised taking multivitamin with iron

## 2014-08-15 NOTE — Patient Instructions (Signed)
It was great seeing you today.   1. Start taking multivitamin with iron, calcium and Vit D 2. Take Coreg twice a day. If you start having dizziness, lightheadedness then decrease to 12.5 mg twice a day and make appointment to see me in 2-4 week afterwards 3. Work on decreasing salt in your diet    Please bring all your medications to every doctors visit  Sign up for My Chart to have easy access to your labs results, and communication with your Primary care physician.  Next Appointment  Please call to make an appointment with Dr Berkley Harvey in 6 months   I look forward to talking with you again at our next visit. If you have any questions or concerns before then, please call the clinic at [phone].  Take Care,   Dr Phill Myron

## 2014-08-15 NOTE — Progress Notes (Signed)
  Patient name: Jameshia Hayashida MRN [id number]  Date of birth: 21-Nov-1960  CC & HPI:  Krithika Tome is a 54 y.o. female presenting today for HTN.  Reports mild intermittent lower extremitt swelling and abdominal swelling , which she associates with her blood pressure and salt intake.   CHRONIC HYPERTENSION  BP Readings from Last 3 Encounters:  08/15/14 130/60  06/29/14 134/60  06/10/14 136/80    Disease Monitoring  Blood pressure range outside clinc: no  Chest pain: no   Dyspnea: no   Claudication: no  Medication compliance: yes  Medication Side Effects: some LE swelling, denies Dizziness/lightheadedness    Preventitive Healthcare:   History  Smoking status  . Never Smoker   Smokeless tobacco  . Never Used    Salt Restriction < 1500 mg daily: Advised reducing salt intake  ROS: See HPI   Medications & Allergies: Reviewed  Social History: Reviewed:   Objective Findings:  Vitals: BP 130/60 mmHg  Pulse 77  Temp(Src) 98.4 F (36.9 C)  Wt 198 lb 9.6 oz (90.084 kg)  Gen: NAD CV: RRR w/o m/r/g, pulses +2 b/l Resp: CTAB w/ normal respiratory effort  Assessment & Plan:   Please See Problem Focused Assessment & Plan

## 2014-08-15 NOTE — Assessment & Plan Note (Signed)
Chronic and stable - Discussed possibility of aspirin therapy for primary stroke prevention. She will consider - If she starts ASA 81mg  then she will call and make lab visit for CBC 1 month after starting

## 2014-08-16 ENCOUNTER — Encounter: Payer: Self-pay | Admitting: Genetic Counselor

## 2014-08-17 ENCOUNTER — Other Ambulatory Visit: Payer: Self-pay | Admitting: Hematology and Oncology

## 2014-08-17 NOTE — Telephone Encounter (Signed)
lst ov 05/19/14.  Next ov 11/22/14.  Chart reviewed

## 2014-09-08 LAB — HM DIABETES EYE EXAM

## 2014-09-13 ENCOUNTER — Encounter: Payer: Self-pay | Admitting: Family Medicine

## 2014-09-13 DIAGNOSIS — H269 Unspecified cataract: Secondary | ICD-10-CM | POA: Insufficient documentation

## 2014-09-13 DIAGNOSIS — H409 Unspecified glaucoma: Secondary | ICD-10-CM | POA: Insufficient documentation

## 2014-11-17 ENCOUNTER — Ambulatory Visit: Payer: BLUE CROSS/BLUE SHIELD | Admitting: Family Medicine

## 2014-11-21 NOTE — Assessment & Plan Note (Addendum)
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 Started Tamoxifen 09/2010  Tamoxifen toxicities: 1. Hot flashes improved with Effexor 2. Plan is to continue tamoxifen for 10 years versus switching her to aromatase inhibitors once she reaches menopause  Breast cancer surveillance: 1. Breast exam 11/21/2014 is normal 2. Mammogram 04/25/2014 is normal on the left breast category B breast density  Patient complains of hair loss.  Survivorship: Patient is exercising and had lost significant weight and stays active. Encouraged her to continue with what she is doing.  Return to clinic in 1 year for follow-up with labs [facility] and estradiol.

## 2014-11-22 ENCOUNTER — Ambulatory Visit (HOSPITAL_BASED_OUTPATIENT_CLINIC_OR_DEPARTMENT_OTHER): Payer: BLUE CROSS/BLUE SHIELD | Admitting: Hematology and Oncology

## 2014-11-22 ENCOUNTER — Other Ambulatory Visit (HOSPITAL_BASED_OUTPATIENT_CLINIC_OR_DEPARTMENT_OTHER): Payer: BLUE CROSS/BLUE SHIELD

## 2014-11-22 ENCOUNTER — Encounter: Payer: Self-pay | Admitting: Hematology and Oncology

## 2014-11-22 ENCOUNTER — Telehealth: Payer: Self-pay | Admitting: Hematology and Oncology

## 2014-11-22 ENCOUNTER — Telehealth: Payer: Self-pay | Admitting: *Deleted

## 2014-11-22 VITALS — BP 134/59 | HR 77 | Temp 98.9°F | Resp 18 | Ht 63.0 in | Wt 196.4 lb

## 2014-11-22 DIAGNOSIS — C50111 Malignant neoplasm of central portion of right female breast: Secondary | ICD-10-CM

## 2014-11-22 DIAGNOSIS — Z853 Personal history of malignant neoplasm of breast: Secondary | ICD-10-CM

## 2014-11-22 LAB — CBC WITH DIFFERENTIAL/PLATELET
BASO%: 0.7 % (ref 0.0–2.0)
Basophils Absolute: 0 10*3/uL (ref 0.0–0.1)
EOS%: 1.8 % (ref 0.0–7.0)
Eosinophils Absolute: 0.1 10*3/uL (ref 0.0–0.5)
HEMATOCRIT: 40.5 % (ref 34.8–46.6)
HGB: 13.2 g/dL (ref 11.6–15.9)
LYMPH%: 36.9 % (ref 14.0–49.7)
MCH: 24.8 pg — AB (ref 25.1–34.0)
MCHC: 32.6 g/dL (ref 31.5–36.0)
MCV: 76 fL — AB (ref 79.5–101.0)
MONO#: 0.5 10*3/uL (ref 0.1–0.9)
MONO%: 7.7 % (ref 0.0–14.0)
NEUT#: 3.2 10*3/uL (ref 1.5–6.5)
NEUT%: 52.9 % (ref 38.4–76.8)
Platelets: 89 10*3/uL — ABNORMAL LOW (ref 145–400)
RBC: 5.33 10*6/uL (ref 3.70–5.45)
RDW: 15.2 % — ABNORMAL HIGH (ref 11.2–14.5)
WBC: 6.1 10*3/uL (ref 3.9–10.3)
lymph#: 2.2 10*3/uL (ref 0.9–3.3)
nRBC: 0 % (ref 0–0)

## 2014-11-22 LAB — COMPREHENSIVE METABOLIC PANEL (CC13)
ALT: 44 U/L (ref 0–55)
AST: 65 U/L — ABNORMAL HIGH (ref 5–34)
Albumin: 3.5 g/dL (ref 3.5–5.0)
Alkaline Phosphatase: 43 U/L (ref 40–150)
Anion Gap: 11 mEq/L (ref 3–11)
BUN: 12.2 mg/dL (ref 7.0–26.0)
CALCIUM: 9.9 mg/dL (ref 8.4–10.4)
CO2: 28 mEq/L (ref 22–29)
CREATININE: 0.9 mg/dL (ref 0.6–1.1)
Chloride: 103 mEq/L (ref 98–109)
EGFR: 90 mL/min/{1.73_m2} — ABNORMAL LOW (ref >90)
Glucose: 125 mg/dl (ref 70–140)
Potassium: 3.1 mEq/L — ABNORMAL LOW (ref 3.5–5.1)
Sodium: 143 mEq/L (ref 136–145)
Total Bilirubin: 0.58 mg/dL (ref 0.20–1.20)
Total Protein: 7.4 g/dL (ref 6.4–8.3)

## 2014-11-22 LAB — FOLLICLE STIMULATING HORMONE: FSH: 29.5 m[IU]/mL

## 2014-11-22 NOTE — Telephone Encounter (Signed)
Appointments made and avs printed for pateint °

## 2014-11-22 NOTE — Telephone Encounter (Signed)
TC from patient requesting lab results from this morning. Only labs that are available are her CBC  And CMET.  The estradiol etc are not available yet. Advised pt to call back in a day or two for those.  Pt. Voiced understanding.

## 2014-11-22 NOTE — Progress Notes (Signed)
Patient Care Team: Olam Idler, MD as PCP - General (Family Medicine)  DIAGNOSIS: Cancer of central portion of right female breast [facility])   Staging form: Breast, AJCC 7th Edition     Clinical: Stage IIA (T2, N0, cM0) - Signed by Deatra Robinson, MD on 03/07/2013     Pathologic: No stage assigned - Unsigned   SUMMARY OF ONCOLOGIC HISTORY:   Cancer of central portion of right female breast ([place])   07/21/2009 Surgery Right breast mastectomy: 2 foci of invasive ductal carcinoma 2.2 cm, 0.5 cm, ER positive PR positive HER-2 negative Ki-67 11%, Oncotype score 27, 18% ROR   10/23/2009 - 12/25/2009 Chemotherapy Adjuvant chemotherapy with Taxotere and Cytoxan every 3 weeks 4 cycles followed by reconstruction of the breast by Dr. Harlow Mares   02/13/2010 -  Anti-estrogen oral therapy Tamoxifen 20 mg daily   02/19/2010 - 03/28/2010 Radiation Therapy Adjuvant radiation therapy   09/12/2010 Surgery Recurrence of invasive ductal carcinoma of the right mastectomy scar is a nodule, invasive ductal carcinoma margins positive, ER 96%, PR 75%, HER-2 negative   09/17/2013 Surgery Left breast reduction surgery    CHIEF COMPLIANT: Annual follow-up of breast cancer  INTERVAL HISTORY: [name] is a 54 year old with above-mentioned history of breast cancer originally in 2011 treated with mastectomy and adjuvant chemotherapy and radiation however in August 2012 she developed a recurrence at the right mastectomy scar and underwent resection. She is currently on adjuvant tamoxifen maintenance therapy. She has been on this medicine since January 2012 and would complete 5 years of therapy. Her major side effects from tamoxifen were hot flashes. We had performed FSH and estradiol blood levels today. If she is menopausal then we will switch her to aromatase inhibitor therapy for the next 5 years.  REVIEW OF SYSTEMS:   Constitutional: Denies fevers, chills or abnormal weight loss Eyes: Denies blurriness of vision Ears, nose,  mouth, throat, and face: Denies mucositis or sore throat Respiratory: Denies cough, dyspnea or wheezes Cardiovascular: Denies palpitation, chest discomfort or lower extremity swelling Gastrointestinal:  Denies nausea, heartburn or change in bowel habits Skin: Denies abnormal skin rashes Lymphatics: Denies new lymphadenopathy or easy bruising Neurological:Denies numbness, tingling or new weaknesses Behavioral/Psych: Mood is stable, no new changes  Breast:  denies any pain or lumps or nodules in either breasts All other systems were reviewed with the patient and are negative.  I have reviewed the past medical history, past surgical history, social history and family history with the patient and they are unchanged from previous note.  ALLERGIES:  has No Known Allergies.  MEDICATIONS:  Current Outpatient Prescriptions  Medication Sig Dispense Refill  . Acetaminophen 650 MG TABS Take 1 tablet (650 mg total) by mouth 3 (three) times daily as needed. 30 tablet 0  . amLODipine (NORVASC) 5 MG tablet Take 1 tablet (5 mg total) by mouth daily. 30 tablet 11  . carvedilol (COREG) 25 MG tablet Take 1 tablet (25 mg total) by mouth 2 (two) times daily with a meal. 60 tablet 0  . hydrochlorothiazide (HYDRODIURIL) 25 MG tablet TAKE ONE TABLET BY MOUTH ONCE DAILY 30 tablet 11  . metFORMIN (GLUCOPHAGE-XR) 500 MG 24 hr tablet TAKE ONE TABLET BY MOUTH ONCE DAILY WITH BREAKFAST. 90 tablet 3  . OVER THE COUNTER MEDICATION Apply 1 application topically as needed (pain). Menthol semisolid stick.    . rosuvastatin (CRESTOR) 20 MG tablet Take 1 tablet (20 mg total) by mouth daily. 90 tablet 3  . tamoxifen (NOLVADEX) 20 MG  tablet TAKE ONE TABLET BY MOUTH ONCE DAILY 30 tablet 3  . traMADol (ULTRAM) 50 MG tablet Take 1 tablet (50 mg total) by mouth 2 (two) times daily as needed. 30 tablet 2  . [DISCONTINUED] cetirizine (ZYRTEC) 10 MG chewable tablet Chew 1 tablet (10 mg total) by mouth daily. 15 tablet 0   No current  facility-administered medications for this visit.    PHYSICAL EXAMINATION: ECOG PERFORMANCE STATUS: 1 - Symptomatic but completely ambulatory  There were no vitals filed for this visit. There were no vitals filed for this visit.  GENERAL:alert, no distress and comfortable SKIN: skin color, texture, turgor are normal, no rashes or significant lesions EYES: normal, Conjunctiva are pink and non-injected, sclera clear OROPHARYNX:no exudate, no erythema and lips, buccal mucosa, and tongue normal  NECK: supple, thyroid normal size, non-tender, without nodularity LYMPH:  no palpable lymphadenopathy in the cervical, axillary or inguinal LUNGS: clear to auscultation and percussion with normal breathing effort HEART: regular rate & rhythm and no murmurs and no lower extremity edema ABDOMEN:abdomen soft, non-tender and normal bowel sounds Musculoskeletal:no cyanosis of digits and no clubbing  NEURO: alert & oriented x 3 with fluent speech, no focal motor/sensory deficits BREAST: No palpable masses or nodules in either right or left breasts. No palpable axillary supraclavicular or infraclavicular adenopathy no breast tenderness or nipple discharge. (exam performed in the presence of a chaperone)  LABORATORY DATA:  I have reviewed the data as listed   Chemistry      Component Value Date/Time   NA 142 05/19/2014 0844   NA 140 03/23/2014 0956   K 3.4* 05/19/2014 0844   K 3.5 03/23/2014 0956   CL 102 03/23/2014 0956   CL 105 02/21/2012 0806   CO2 29 05/19/2014 0844   CO2 30 03/23/2014 0956   BUN 9.9 05/19/2014 0844   BUN 12 03/23/2014 0956   CREATININE 0.9 05/19/2014 0844   CREATININE 0.68 03/23/2014 0956   CREATININE 0.92 03/19/2014 0831      Component Value Date/Time   CALCIUM 9.1 05/19/2014 0844   CALCIUM 9.6 03/23/2014 0956   ALKPHOS 53 05/19/2014 0844   ALKPHOS 47 02/11/2014 0915   AST 45* 05/19/2014 0844   AST 38* 02/11/2014 0915   ALT 39 05/19/2014 0844   ALT 30 02/11/2014  0915   BILITOT 0.47 05/19/2014 0844   BILITOT 0.3 02/11/2014 0915       Lab Results  Component Value Date   WBC 9.9 05/19/2014   HGB 13.1 05/19/2014   HCT 40.3 05/19/2014   MCV 77.1* 05/19/2014   PLT 106* 05/19/2014   NEUTROABS 6.9* 05/19/2014   ASSESSMENT & PLAN:  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 Started Tamoxifen 09/2010  Tamoxifen toxicities: 1. Hot flashes improved with Effexor 2. Plan is to continue tamoxifen for 10 years versus switching her to aromatase inhibitors once she reaches menopause FSH and estradiol levels were drawn today and I will call her with the results of this test. The decision would be to switch her to aromatase inhibitor therapy if she is menopausal.  Breast cancer surveillance: 1. Breast exam 11/22/2014 is normal 2. Mammogram 04/25/2014 is normal on the  left breast category B breast density  Patient complains of hair loss.  Survivorship: Patient is exercising and had lost significant weight and stays active. Encouraged her to continue with what she is doing. Patient is interested in weight loss clinic if we do get started in the cancer center.  Return to clinic in 1 year for follow-up.   No orders of the defined types were placed in this encounter.   The patient has a good understanding of the overall plan. she agrees with it. she will call with any problems that may develop before the next visit here.   Rulon Eisenmenger, MD 11/22/2014

## 2014-11-22 NOTE — Addendum Note (Signed)
Addended by: Prentiss Bells on: 11/22/2014 09:37 AM   Modules accepted: Medications

## 2014-11-24 ENCOUNTER — Telehealth: Payer: Self-pay | Admitting: Family Medicine

## 2014-11-24 ENCOUNTER — Telehealth: Payer: Self-pay | Admitting: *Deleted

## 2014-11-24 DIAGNOSIS — E876 Hypokalemia: Secondary | ICD-10-CM

## 2014-11-24 NOTE — Telephone Encounter (Signed)
Will forward to MD. Glenora Morocho,CMA  

## 2014-11-24 NOTE — Telephone Encounter (Signed)
Informed patient of recent test results. Potassium 3.1. Would you like to give her a prescription? Patient would like for you to discuss the increase of the AST with her. Message sent to MD Lawerance Bach.

## 2014-11-24 NOTE — Telephone Encounter (Signed)
Pt called because her oncology doctor would like her to be on a potassium pill. jw

## 2014-11-24 NOTE — Telephone Encounter (Signed)
Spoke with the patient and discussed her hypokalemia. She was told it was related to her HCTZ but she has been on this medication for some time. Upon chart review, it looks like she runs in the low K range. Will obtain labs and determine if she needs supplementation.   Rosemarie Ax, MD PGY-3, [facility] 11/24/2014, 4:03 PM

## 2014-11-24 NOTE — Telephone Encounter (Signed)
Received a call from Dr. Geralyn Flash nurse [facility]) that patient's potassium level was 3.1.  Patient is on HCTZ; requesting patient's PCP to manage the potassium level.  Lab work was completed by the [facility].  Lab results are in EPIC.  Please give them a call with questions at [phone].  Will forward to PCP.  Derl Barrow, RN

## 2014-11-25 ENCOUNTER — Telehealth: Payer: Self-pay | Admitting: *Deleted

## 2014-11-25 NOTE — Telephone Encounter (Signed)
Dr Gudena spoke with patient 

## 2014-11-25 NOTE — Telephone Encounter (Signed)
TC from pt this am requesting a call back from Dr. Lindi Adie regarding recent lab work. She wants to speak to Dr. Lindi Adie directly

## 2014-11-26 LAB — ESTRADIOL, ULTRA SENS: ESTRADIOL, ULTRA SENSITIVE: 15 pg/mL

## 2014-11-30 ENCOUNTER — Other Ambulatory Visit: Payer: BLUE CROSS/BLUE SHIELD

## 2014-12-10 ENCOUNTER — Other Ambulatory Visit: Payer: Self-pay | Admitting: Hematology and Oncology

## 2014-12-12 ENCOUNTER — Other Ambulatory Visit: Payer: Self-pay | Admitting: *Deleted

## 2014-12-12 DIAGNOSIS — C50111 Malignant neoplasm of central portion of right female breast: Secondary | ICD-10-CM

## 2014-12-12 MED ORDER — TAMOXIFEN CITRATE 20 MG PO TABS: 20.0000 mg | ORAL_TABLET | Freq: Every day | ORAL | Status: DC

## 2014-12-20 ENCOUNTER — Ambulatory Visit: Payer: BLUE CROSS/BLUE SHIELD | Admitting: Family Medicine

## 2014-12-27 ENCOUNTER — Ambulatory Visit (INDEPENDENT_AMBULATORY_CARE_PROVIDER_SITE_OTHER): Payer: 59 | Admitting: Family Medicine

## 2014-12-27 ENCOUNTER — Encounter: Payer: Self-pay | Admitting: Family Medicine

## 2014-12-27 VITALS — BP 132/84 | Temp 98.0°F | Wt 197.5 lb

## 2014-12-27 DIAGNOSIS — E876 Hypokalemia: Secondary | ICD-10-CM | POA: Diagnosis not present

## 2014-12-27 DIAGNOSIS — I10 Essential (primary) hypertension: Secondary | ICD-10-CM | POA: Diagnosis not present

## 2014-12-27 DIAGNOSIS — M150 Primary generalized (osteo)arthritis: Secondary | ICD-10-CM | POA: Diagnosis not present

## 2014-12-27 DIAGNOSIS — M7062 Trochanteric bursitis, left hip: Secondary | ICD-10-CM | POA: Diagnosis not present

## 2014-12-27 DIAGNOSIS — R7309 Other abnormal glucose: Secondary | ICD-10-CM | POA: Diagnosis not present

## 2014-12-27 DIAGNOSIS — M7061 Trochanteric bursitis, right hip: Secondary | ICD-10-CM

## 2014-12-27 DIAGNOSIS — R739 Hyperglycemia, unspecified: Secondary | ICD-10-CM | POA: Diagnosis not present

## 2014-12-27 DIAGNOSIS — R600 Localized edema: Secondary | ICD-10-CM | POA: Diagnosis not present

## 2014-12-27 DIAGNOSIS — R740 Nonspecific elevation of levels of transaminase and lactic acid dehydrogenase [LDH]: Secondary | ICD-10-CM | POA: Diagnosis not present

## 2014-12-27 DIAGNOSIS — M159 Polyosteoarthritis, unspecified: Secondary | ICD-10-CM

## 2014-12-27 DIAGNOSIS — E785 Hyperlipidemia, unspecified: Secondary | ICD-10-CM | POA: Diagnosis not present

## 2014-12-27 LAB — COMPREHENSIVE METABOLIC PANEL
ALT: 49 U/L — AB (ref 6–29)
AST: 72 U/L — AB (ref 10–35)
Albumin: 3.5 g/dL — ABNORMAL LOW (ref 3.6–5.1)
Alkaline Phosphatase: 37 U/L (ref 33–130)
BUN: 13 mg/dL (ref 7–25)
CALCIUM: 9.4 mg/dL (ref 8.6–10.4)
CHLORIDE: 101 mmol/L (ref 98–110)
CO2: 30 mmol/L (ref 20–31)
Creat: 0.8 mg/dL (ref 0.50–1.05)
GLUCOSE: 102 mg/dL — AB (ref 65–99)
POTASSIUM: 3.4 mmol/L — AB (ref 3.5–5.3)
SODIUM: 141 mmol/L (ref 135–146)
Total Bilirubin: 0.3 mg/dL (ref 0.2–1.2)
Total Protein: 6.9 g/dL (ref 6.1–8.1)

## 2014-12-27 MED ORDER — TRAMADOL HCL 50 MG PO TABS
50.0000 mg | ORAL_TABLET | Freq: Two times a day (BID) | ORAL | Status: DC | PRN
Start: 2014-12-27 — End: 2015-04-18

## 2014-12-27 NOTE — Patient Instructions (Signed)
It was great seeing you today.   1. We will check her potassium today and I will call you with the results of the knee to make any changes to your therapy.  2. Your hip pain is due to greater trochanteric pain syndrome.  1. Stretches 1. Cross body hip stretch 2. Knee to chest 3. Figure 4 2. Exercise 1. Walking - Can be done in water for improved pain relief 2. Standing Straight leg hip abduction against the wall   Please bring all your medications to every doctors visit  Sign up for My Chart to have easy access to your labs results, and communication with your Primary care physician.  Next Appointment  Please call to make an appointment with Dr Berkley Harvey in 3-4 weeks if not improving.    I look forward to talking with you again at our next visit. If you have any questions or concerns before then, please call the clinic at [phone].  Take Care,   Dr Phill Myron

## 2014-12-27 NOTE — Progress Notes (Signed)
  Patient name: [name] MRN [id number]  Date of birth: [birth year]  CC & HPI:  [name] is a 54 y.o. female presenting today for bilateral hip pain and concern for low potassium.   Bilateral hip pain - She reports bilateral hip pain on the lateral aspect of both legs that has been ongoing intermittently for the past year.  - Has been increase in the past 2 months and she started increasing her walking - Pain is a burning sensation that is worse with activity as well as laying on affected side - She has taken Tylenol and Ultram with some improvement - She reports she was told before that she has arthritis in both hips and she considers this to be the source of her problem  Low Potassium - She reports she has been in remission from breast cancer, but when she went for checkup her potassium was found to be low - Denies muscle spasms.  ROS: See HPI    Objective Findings:  Vitals: BP 132/84 mmHg  Temp(Src) 98 F (36.7 C) (Oral)  Wt 197 lb 8 oz (89.585 kg)  Gen: NAD Legs: Bilateral tenderness to superior thighs over lateral aspect of greater trochanter.  Positive Trendelenburg bilaterally.  Pain is reproduced with single-leg stance for 15 seconds.  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  Avoiding NSAIDs due to thrombocytopenia.  - Recommended heating pad and Tylenol as needed - Refilled Ultram # 60 since unable to take NSAIDs; advised this would not be a long-term prescription - Offered greater trochanteric bursitis steroid injection; she declined - Educated on IT band and gluteus medius stretching and strengthening exercises - Follow-up in 3-4 weeks if not improving   Hypokalemia History of low potassium.  - Check CMP today; consider oral potassium replacement if needed

## 2014-12-27 NOTE — Assessment & Plan Note (Signed)
Bilateral greater trochanteric pain syndrome and gluteus medius weakness.  Avoiding NSAIDs due to thrombocytopenia.  - Recommended heating pad and Tylenol as needed - Refilled Ultram # 60 since unable to take NSAIDs; advised this would not be a long-term prescription - Offered greater trochanteric bursitis steroid injection; she declined - Educated on IT band and gluteus medius stretching and strengthening exercises - Follow-up in 3-4 weeks if not improving

## 2014-12-27 NOTE — Assessment & Plan Note (Signed)
History of low potassium.  - Check CMP today; consider oral potassium replacement if needed

## 2015-01-06 ENCOUNTER — Telehealth: Payer: Self-pay | Admitting: *Deleted

## 2015-01-06 NOTE — Telephone Encounter (Signed)
Patient verified DOB Patient is calling to obtain lab results from visit on 12/27/14. Please call patient and inform her of the results.

## 2015-01-06 NOTE — Telephone Encounter (Signed)
Will forward to MD. Kathrina Crosley,CMA  

## 2015-01-09 ENCOUNTER — Telehealth: Payer: Self-pay | Admitting: Family Medicine

## 2015-01-09 NOTE — Telephone Encounter (Signed)
Pt called and would like to speak to her doctor about her lab results. She said this is the second time she has called. jw

## 2015-01-09 NOTE — Telephone Encounter (Signed)
Will forward to PCP for review. Munachimso Rigdon, CMA. 

## 2015-01-10 NOTE — Telephone Encounter (Signed)
Calling lab results again

## 2015-01-11 NOTE — Telephone Encounter (Signed)
Called and discussed mildly low potassium and mildly elevated LFTs. She will make an appointment in 4-6 weeks to recheck labs and discuss next steps.

## 2015-01-22 ENCOUNTER — Other Ambulatory Visit: Payer: Self-pay | Admitting: Family Medicine

## 2015-01-25 NOTE — Telephone Encounter (Signed)
Needs dr Berkley Harvey to call in all he rBlood pressure pills, metaformin, because she is out. [facility] on [address] says they have been faxing but there has been no response.

## 2015-01-26 NOTE — Telephone Encounter (Signed)
Pt called because she has been waiting on her Metformin and Hydrochlorothiazide to be refilled. The problem was they keep sending to the wrong doctor. Can we get this called in today for her. jw

## 2015-02-17 ENCOUNTER — Ambulatory Visit (INDEPENDENT_AMBULATORY_CARE_PROVIDER_SITE_OTHER): Payer: 59 | Admitting: Family Medicine

## 2015-02-17 ENCOUNTER — Encounter: Payer: Self-pay | Admitting: Family Medicine

## 2015-02-17 VITALS — BP 134/78 | HR 79 | Temp 98.3°F | Ht 63.0 in | Wt 201.0 lb

## 2015-02-17 DIAGNOSIS — E785 Hyperlipidemia, unspecified: Secondary | ICD-10-CM | POA: Diagnosis not present

## 2015-02-17 DIAGNOSIS — I10 Essential (primary) hypertension: Secondary | ICD-10-CM

## 2015-02-17 DIAGNOSIS — R7309 Other abnormal glucose: Secondary | ICD-10-CM | POA: Diagnosis not present

## 2015-02-17 DIAGNOSIS — R600 Localized edema: Secondary | ICD-10-CM | POA: Diagnosis not present

## 2015-02-17 DIAGNOSIS — R739 Hyperglycemia, unspecified: Secondary | ICD-10-CM | POA: Diagnosis not present

## 2015-02-17 DIAGNOSIS — E876 Hypokalemia: Secondary | ICD-10-CM | POA: Diagnosis not present

## 2015-02-17 DIAGNOSIS — R7303 Prediabetes: Secondary | ICD-10-CM

## 2015-02-17 DIAGNOSIS — R740 Nonspecific elevation of levels of transaminase and lactic acid dehydrogenase [LDH]: Secondary | ICD-10-CM | POA: Diagnosis not present

## 2015-02-17 DIAGNOSIS — R748 Abnormal levels of other serum enzymes: Secondary | ICD-10-CM

## 2015-02-17 DIAGNOSIS — R5381 Other malaise: Secondary | ICD-10-CM

## 2015-02-17 DIAGNOSIS — Z1159 Encounter for screening for other viral diseases: Secondary | ICD-10-CM

## 2015-02-17 LAB — POCT GLYCOSYLATED HEMOGLOBIN (HGB A1C): HEMOGLOBIN A1C: 6.5

## 2015-02-17 MED ORDER — ROSUVASTATIN CALCIUM 5 MG PO TABS: 5.0000 mg | ORAL_TABLET | Freq: Every day | ORAL | Status: DC

## 2015-02-17 NOTE — Patient Instructions (Signed)
It was great seeing you today.   1. Take Crestor 5 mg once a week for 2 weeks then increase to 5 mg twice a week.  I have order some labs today to check your elevated liver enzymes. I will send you a letter with the results, or call you if we need to make any changes to your current therapies.  I have ordered a right upper quadrant ultrasound to evaluate your liver enzymes.    Please bring all your medications to every doctors visit  Sign up for My Chart to have easy access to your labs results, and communication with your Primary care physician.  Next Appointment  Please make an appointment with Dr Berkley Harvey in 1-2 months for cholesterol and elevated liver enzymes   I look forward to talking with you again at our next visit. If you have any questions or concerns before then, please call the clinic at [phone].  Take Care,   Dr Phill Myron

## 2015-02-17 NOTE — Assessment & Plan Note (Signed)
Mildly elevated AST and ALTs possibly due to chronic tamoxifen use versus NAFLD.  Alkaline phosphatase and bilirubin normal.  Does not drink alcohol or have family history of liver disease.  No signs of gallbladder disease. Iron studies and TSH.  Normal within the past year.  - Check hepatitis panel, liver function panel and RUQ [place]

## 2015-02-17 NOTE — Assessment & Plan Note (Addendum)
Continues to take metformin XR 500 Milligrams daily without difficulty - recheck hemoglobin A1c.  Consider increasing metformin XR 1000 mg daily

## 2015-02-17 NOTE — Assessment & Plan Note (Signed)
Has been intolerant to pravastatin due to GI issues, atorvastatin and Crestor due to myalgias.  - decrease Crestor to 5 mg once a week; increased to 5 mg twice a week after 2 weeks.  - She will call in 3-4 weeks to confirm whether she was able to tolerate this or not

## 2015-02-17 NOTE — Assessment & Plan Note (Signed)
Blood pressure well controlled today.  - Continue hydrochlorothiazide 25 mg daily, Coreg 25 mg twice a day, Norvasc 5 mg daily - Follow-up in 3 months

## 2015-02-17 NOTE — Assessment & Plan Note (Signed)
>>  ASSESSMENT AND PLAN FOR DM (DIABETES MELLITUS) WITH COMPLICATIONS (HCC) WRITTEN ON 02/17/2015  2:24 PM BY ALTO LYNWOOD SAUNDERS, MD  Continues to take metformin  XR 500 Milligrams daily without difficulty - recheck hemoglobin A1c.  Consider increasing metformin  XR 1000 mg daily

## 2015-02-17 NOTE — Progress Notes (Signed)
  Patient name: [name] MRN [id number]  Date of birth: [birth year]  CC & HPI:  [name] is a 55 y.o. female presenting today for HTN,HLD, prediabetes and elevated liver enzymes.   CHRONIC HYPERTENSION  BP Readings from Last 3 Encounters:  02/17/15 134/78  12/27/14 132/84  11/22/14 134/59    Control: good Disease Monitoring  Blood pressure range outside clinc: no  Chest pain: no   Dyspnea: no   Claudication: no  Medication compliance: yes  Medication Side Effects: no Dizziness/lightheadedness;    Preventitive Healthcare:   History  Smoking status  . Never Smoker   Smokeless tobacco  . Never Used   Hyperlipidemia  Medication Compliance: No - discontinued Crestor due to myalgias  Prediabetes - denies excessive thirst, urination, chest pain - Continues to have some blurry vision but has diagnosis of cataracts and glaucoma  Elevated liver enzymes - denies any right upper quadrant pain - Denies alcohol use - denies history of IV drug use - Denies family history of liver disease - Currently on tamoxifen due to history of breast cancer  ROS: See HPI   Medical & Surgical Hx:  Reviewed  Medications & Allergies: Reviewed  Social History: Reviewed:   Objective Findings:  Vitals: BP 134/78 mmHg  Pulse 79  Temp(Src) 98.3 F (36.8 C) (Oral)  Ht 5\' 3"  (1.6 m)  Wt 201 lb (91.173 kg)  BMI 35.61 kg/m2  Gen: NAD CV: RRR w/o m/r/g, pulses +2 b/l Resp: CTAB w/ normal respiratory effort GI: No skin changes; BS + x 4 quads; No tenderness or masses, No CVA tenderness  Assessment & Plan:   Essential hypertension Blood pressure well controlled today.  - Continue hydrochlorothiazide 25 mg daily, Coreg 25 mg twice a day, Norvasc 5 mg daily - Follow-up in 3 months  Prediabetes Continues to take metformin XR 500 Milligrams daily without difficulty - recheck hemoglobin A1c.  Consider increasing metformin XR 1000 mg daily  Hyperlipidemia Has been intolerant to  pravastatin due to GI issues, atorvastatin and Crestor due to myalgias.  - decrease Crestor to 5 mg once a week; increased to 5 mg twice a week after 2 weeks.  - She will call in 3-4 weeks to confirm whether she was able to tolerate this or not  Elevated liver enzymes Mildly elevated AST and ALTs possibly due to chronic tamoxifen use versus NAFLD.  Alkaline phosphatase and bilirubin normal.  Does not drink alcohol or have family history of liver disease.  No signs of gallbladder disease. Iron studies and TSH.  Normal within the past year.  - Check hepatitis panel, liver function panel and RUQ [place]

## 2015-02-18 LAB — HEPATIC FUNCTION PANEL
ALBUMIN: 3.7 g/dL (ref 3.6–5.1)
ALK PHOS: 41 U/L (ref 33–130)
ALT: 53 U/L — ABNORMAL HIGH (ref 6–29)
AST: 75 U/L — ABNORMAL HIGH (ref 10–35)
Bilirubin, Direct: 0.1 mg/dL (ref <=0.2)
Indirect Bilirubin: 0.2 mg/dL (ref 0.2–1.2)
TOTAL PROTEIN: 7 g/dL (ref 6.1–8.1)
Total Bilirubin: 0.3 mg/dL (ref 0.2–1.2)

## 2015-02-18 LAB — HEPATITIS PANEL, ACUTE
HCV Ab: NEGATIVE
HEP B S AG: NEGATIVE
Hep A IgM: NONREACTIVE
Hep B C IgM: NONREACTIVE

## 2015-02-22 ENCOUNTER — Telehealth: Payer: Self-pay | Admitting: Nutrition

## 2015-02-22 ENCOUNTER — Ambulatory Visit (HOSPITAL_COMMUNITY)
Admission: RE | Admit: 2015-02-22 | Discharge: 2015-02-22 | Disposition: A | Discharge status: Home / Self Care | Payer: 59 | Source: Ambulatory Visit | Attending: Family Medicine | Admitting: Family Medicine

## 2015-02-22 ENCOUNTER — Telehealth: Payer: Self-pay | Admitting: Family Medicine

## 2015-02-22 DIAGNOSIS — K760 Fatty (change of) liver, not elsewhere classified: Secondary | ICD-10-CM | POA: Diagnosis not present

## 2015-02-22 DIAGNOSIS — E11319 Type 2 diabetes mellitus with unspecified diabetic retinopathy without macular edema: Secondary | ICD-10-CM

## 2015-02-22 DIAGNOSIS — R748 Abnormal levels of other serum enzymes: Secondary | ICD-10-CM | POA: Diagnosis not present

## 2015-02-22 DIAGNOSIS — E785 Hyperlipidemia, unspecified: Secondary | ICD-10-CM

## 2015-02-22 DIAGNOSIS — R7989 Other specified abnormal findings of blood chemistry: Secondary | ICD-10-CM | POA: Diagnosis not present

## 2015-02-22 NOTE — Telephone Encounter (Signed)
Pt called and would like to know her lab results. jw °

## 2015-02-22 NOTE — Telephone Encounter (Signed)
Contacted patient by phone to let her know about [place] healthly eating and activity class. She was not available.  I will try to contact in the future.

## 2015-02-22 NOTE — Telephone Encounter (Signed)
Patient returned my phone call regarding new program entitled [employer]. Provided information about our new healthy eating and wellness program to patient. Patient states she might be interested but wants to think about it and states she will call me back. Explained Class size is limited, registration required, fee $25 required to reserve spot. Patient has my contact information.

## 2015-02-22 NOTE — Telephone Encounter (Signed)
Will forward to PCP to advise

## 2015-02-23 NOTE — Telephone Encounter (Signed)
Patient returning MD's call, can be reached at number listed anytime this afternoon.

## 2015-02-23 NOTE — Telephone Encounter (Signed)
Attempted to call and discuss recent labs, however unable to reach patient asked her son to have her call back and notify us of the best time and number to reach her.

## 2015-02-23 NOTE — Telephone Encounter (Signed)
Called and reviewed recent labs and ultrasound with patient.  Ultrasound showed fatty alteration of liver.  No gallbladder disease.  LFTs remain mildly elevated, however, she continues to be symptomatic.  See problem based assessment and plan for ongoing evaluation.

## 2015-02-23 NOTE — Telephone Encounter (Signed)
Patient returning PCP call. Please contact once more at earliest convenience. [name], ASA

## 2015-02-23 NOTE — Assessment & Plan Note (Signed)
>>  ASSESSMENT AND PLAN FOR DM (DIABETES MELLITUS) WITH COMPLICATIONS (HCC) WRITTEN ON 02/23/2015  4:37 PM BY ALTO LYNWOOD SAUNDERS, MD  A1c 6.5.  Diabetes, well-controlled.  - Inc Metformin  XR to 1000mg  qd - f/u in 3 months

## 2015-02-23 NOTE — Assessment & Plan Note (Signed)
A1c 6.5.  Diabetes, well-controlled.  - Inc Metformin XR to 1000mg  qd - f/u in 3 months

## 2015-02-23 NOTE — Assessment & Plan Note (Signed)
She reports having mild rash.  Has been taking Crestor 20 mg daily from old prescription.  - Advised discontinuing Crestor for 1-2 weeks, then restarting Crestor 5 mg once or twice a week.  If she is able to tolerate this dose without myalgias or rash.  She can increase to Crestor 5 mg daily.  She will call in 2 weeks to let me know how she is tolerating this medication.

## 2015-02-23 NOTE — Assessment & Plan Note (Signed)
LFTs remain mildly elevated at 2 times normal limits right upper quadrant ultrasound shows fatty infiltration of liver consistent with nonalcoholic fatty liver disease.  Discussed liver biopsy for assessment of NASH; however, we will defer this at this time until she is able to follow up with her oncologist and discuss coming off tamoxifen in March.  She will make appointment.  Follow-up with me after she has seen her oncologist and we can recheck her LFTs,  If she is taken off medication.

## 2015-02-23 NOTE — Telephone Encounter (Signed)
Will forward to MD. Jazmin Hartsell,CMA  

## 2015-02-23 NOTE — Telephone Encounter (Signed)
Will forward to md.  Jazmin Hartsell,CMA  

## 2015-02-24 ENCOUNTER — Telehealth: Payer: Self-pay | Admitting: *Deleted

## 2015-02-24 NOTE — Telephone Encounter (Signed)
Pt states Dr Berkley Harvey [facility] [place] ran labs on her liver. Wanted to make Dr Lindi Adie aware. Didn't know if Dr Lindi Adie needs to see her sooner than ~June 2017

## 2015-02-25 ENCOUNTER — Other Ambulatory Visit: Payer: Self-pay | Admitting: Family Medicine

## 2015-02-27 ENCOUNTER — Telehealth: Payer: Self-pay | Admitting: Family Medicine

## 2015-02-27 NOTE — Telephone Encounter (Signed)
Will forward to PCP for medication refill.

## 2015-02-27 NOTE — Telephone Encounter (Signed)
Pt called and needs a refill on her BP medication called in. Jw

## 2015-02-28 ENCOUNTER — Telehealth: Payer: Self-pay | Admitting: Family Medicine

## 2015-02-28 NOTE — Telephone Encounter (Signed)
Pt called and needs a refill on her BP medication she is out. [name]

## 2015-03-01 ENCOUNTER — Other Ambulatory Visit: Payer: Self-pay | Admitting: Family Medicine

## 2015-03-01 DIAGNOSIS — I10 Essential (primary) hypertension: Secondary | ICD-10-CM

## 2015-03-01 MED ORDER — AMLODIPINE BESYLATE 5 MG PO TABS: 5.0000 mg | ORAL_TABLET | Freq: Every day | ORAL | Status: DC

## 2015-03-01 MED ORDER — CARVEDILOL 25 MG PO TABS: 25.0000 mg | ORAL_TABLET | Freq: Two times a day (BID) | ORAL | Status: DC

## 2015-03-01 MED ORDER — HYDROCHLOROTHIAZIDE 25 MG PO TABS: 25.0000 mg | ORAL_TABLET | Freq: Every day | ORAL | Status: DC

## 2015-03-10 ENCOUNTER — Ambulatory Visit (INDEPENDENT_AMBULATORY_CARE_PROVIDER_SITE_OTHER): Payer: Medicare Other | Admitting: Family Medicine

## 2015-03-10 ENCOUNTER — Ambulatory Visit (HOSPITAL_COMMUNITY)
Admission: RE | Admit: 2015-03-10 | Discharge: 2015-03-10 | Disposition: A | Discharge status: Home / Self Care | Payer: 59 | Source: Ambulatory Visit | Attending: Family Medicine | Admitting: Family Medicine

## 2015-03-10 ENCOUNTER — Encounter: Payer: Self-pay | Admitting: Family Medicine

## 2015-03-10 VITALS — BP 157/68 | HR 79 | Temp 98.3°F | Wt 191.0 lb

## 2015-03-10 DIAGNOSIS — R002 Palpitations: Secondary | ICD-10-CM | POA: Diagnosis not present

## 2015-03-10 DIAGNOSIS — I10 Essential (primary) hypertension: Secondary | ICD-10-CM | POA: Diagnosis not present

## 2015-03-10 DIAGNOSIS — R600 Localized edema: Secondary | ICD-10-CM | POA: Diagnosis not present

## 2015-03-10 DIAGNOSIS — R5383 Other fatigue: Secondary | ICD-10-CM | POA: Diagnosis not present

## 2015-03-10 DIAGNOSIS — E785 Hyperlipidemia, unspecified: Secondary | ICD-10-CM | POA: Diagnosis not present

## 2015-03-10 DIAGNOSIS — R739 Hyperglycemia, unspecified: Secondary | ICD-10-CM | POA: Diagnosis not present

## 2015-03-10 DIAGNOSIS — R740 Nonspecific elevation of levels of transaminase and lactic acid dehydrogenase [LDH]: Secondary | ICD-10-CM | POA: Diagnosis not present

## 2015-03-10 DIAGNOSIS — R7309 Other abnormal glucose: Secondary | ICD-10-CM | POA: Diagnosis not present

## 2015-03-10 DIAGNOSIS — E876 Hypokalemia: Secondary | ICD-10-CM | POA: Diagnosis not present

## 2015-03-10 LAB — CBC
HCT: 40.1 % (ref 36.0–46.0)
Hemoglobin: 13.1 g/dL (ref 12.0–15.0)
MCH: 24.2 pg — ABNORMAL LOW (ref 26.0–34.0)
MCHC: 32.7 g/dL (ref 30.0–36.0)
MCV: 74.1 fL — ABNORMAL LOW (ref 78.0–100.0)
Platelets: 77 10*3/uL — ABNORMAL LOW (ref 150–400)
RBC: 5.41 MIL/uL — ABNORMAL HIGH (ref 3.87–5.11)
RDW: 15 % (ref 11.5–15.5)
WBC: 7.8 10*3/uL (ref 4.0–10.5)

## 2015-03-10 LAB — BASIC METABOLIC PANEL
BUN: 10 mg/dL (ref 7–25)
CALCIUM: 9.3 mg/dL (ref 8.6–10.4)
CO2: 30 mmol/L (ref 20–31)
Chloride: 98 mmol/L (ref 98–110)
Creat: 0.73 mg/dL (ref 0.50–1.05)
Glucose, Bld: 114 mg/dL — ABNORMAL HIGH (ref 65–99)
Potassium: 3.5 mmol/L (ref 3.5–5.3)
SODIUM: 145 mmol/L (ref 135–146)

## 2015-03-10 LAB — TSH: TSH: 1.539 u[IU]/mL (ref 0.350–4.500)

## 2015-03-10 NOTE — Progress Notes (Signed)
Subjective:     Patient ID: [name], female   DOB: [birth year], 55 y.o.   MRN: [id number]  Palpitations  This is a new problem. The current episode started 1 to 4 weeks ago (Started 2 wks ago). The problem occurs every several days (She felt irregular beating of her heart today). The problem has been waxing and waning. On average, each episode lasts 4 seconds. The symptoms are aggravated by stress (exertion). Associated symptoms include an irregular heartbeat, malaise/fatigue and shortness of breath. Pertinent negatives include no chest pain, dizziness, fever, nausea, near-syncope or vomiting. Associated symptoms comments: Feels dizzy at times when she gets up, otherwise no dizziness. She feels like 10 mg Crestor is making her feel sick. She recently went up on the dose per her PCP. She had itching and rash with her Crestor 10 mg and then she started having all these symptoms.. Treatments tried: Tylenol. Risk factors include dyslipidemia. There is no history of anemia, anxiety, drug use or hyperthyroidism.  She also mentioned she stopped her Norvasc for some time and restarted back 2 days ago. Fatigue: As above. Feels tired for the last few days, no fall. Appetite fine, no GI symptoms. HTN: She recently discontinued her Norvasc thinking that was contributing to her symptoms, but no improvement with holding Norvasc so she restarted it back 2 days ago. She has been taking Coreg 25 daily instead of BID. She stated she did not know she was supposed to take it twice daily.  Current Outpatient Prescriptions on File Prior to Visit  Medication Sig Dispense Refill  . Acetaminophen 650 MG TABS Take 1 tablet (650 mg total) by mouth 3 (three) times daily as needed. 30 tablet 0  . amLODipine (NORVASC) 5 MG tablet Take 1 tablet (5 mg total) by mouth daily. 30 tablet 4  . carvedilol (COREG) 25 MG tablet Take 1 tablet (25 mg total) by mouth 2 (two) times daily with a meal. 60 tablet 4  . hydrochlorothiazide  (HYDRODIURIL) 25 MG tablet Take 1 tablet (25 mg total) by mouth daily. 90 tablet 1  . metFORMIN (GLUCOPHAGE-XR) 500 MG 24 hr tablet TAKE ONE TABLET BY MOUTH ONCE DAILY WITH BREAKFAST 90 tablet 0  . rosuvastatin (CRESTOR) 5 MG tablet Take 1 tablet (5 mg total) by mouth daily. 30 tablet 1  . tamoxifen (NOLVADEX) 20 MG tablet Take 1 tablet (20 mg total) by mouth daily. 90 tablet 3  . traMADol (ULTRAM) 50 MG tablet Take 1 tablet (50 mg total) by mouth 2 (two) times daily as needed. 60 tablet 0  . OVER THE COUNTER MEDICATION Apply 1 application topically as needed (pain). Menthol semisolid stick.    . [DISCONTINUED] cetirizine (ZYRTEC) 10 MG chewable tablet Chew 1 tablet (10 mg total) by mouth daily. 15 tablet 0   No current facility-administered medications on file prior to visit.   Past Medical History  Diagnosis Date  . Hypertension   . Hyperlipidemia   . Thrombocytopenia ([place])   . Diabetes mellitus   . Arthritis   . Breast CA ([place])     breast CA- right breast  . Thyroid disease     parathyroid removed  . Heel spur     bilateral      Review of Systems  Constitutional: Positive for malaise/fatigue. Negative for fever.  Respiratory: Positive for shortness of breath.   Cardiovascular: Positive for palpitations. Negative for chest pain and near-syncope.  Gastrointestinal: Negative.  Negative for nausea and vomiting.  Genitourinary: Negative.  Musculoskeletal: Negative.   Neurological: Negative for dizziness, light-headedness and headaches.  All other systems reviewed and are negative.  Filed Vitals:   03/10/15 0904  BP: 157/68  Pulse: 79  Temp: 98.3 F (36.8 C)  TempSrc: Oral  Weight: 191 lb (86.637 kg)  SpO2: 98%       Objective:   Physical Exam  Constitutional: She is oriented to person, place, and time. She appears well-developed. No distress.  Cardiovascular: Normal rate, regular rhythm, normal heart sounds and intact distal pulses.   No murmur  heard. Pulmonary/Chest: Effort normal and breath sounds normal. No respiratory distress. She has no wheezes.  Abdominal: Soft. Bowel sounds are normal. She exhibits no distension and no mass. There is no tenderness.  Musculoskeletal: Normal range of motion. She exhibits no edema.  Neurological: She is alert and oriented to person, place, and time.  Nursing note and vitals reviewed.      Assessment:     Palpitation Fatigue     Plan:     Check problem list.

## 2015-03-10 NOTE — Assessment & Plan Note (Signed)
CBC checked to r/o Anemia. TSH to r/o endocrine disorder. She mentioned occasional dizziness with standing up but her orthostatic vitals looks fine. Plan to keep self hydrated. I will contact her with test result. Advised ED visit if symptoms persist.

## 2015-03-10 NOTE — Patient Instructions (Signed)
It was nice seeing you today. I am sorry you don't feel well. It might be related to your Crestor as pointed out by you. Your EKG looks fine hence that is reassuring. However if your palpitation continues despite cutting back on your Crestor please call, then you might need a heart monitor for 24-48hrs to determine if there is something we are missing. Also take your blood pressure medications as instructed by your PCP. Continue to check your BP at home, if too low, hold off on your medication and contact our office. Your BP range should be between 110/70 to 145/80. We will get some blood work today and contact you with results as soon as possible.   Please go to the emergency room if symptoms worsens.

## 2015-03-10 NOTE — Assessment & Plan Note (Signed)
Poor adherence to medication. Counseling done. Advised to use meds as instructed by PCP. Continue home BP monitoring, if low , hold antihypertensive agent and contact PCP right away. She verbalized understanding.

## 2015-03-10 NOTE — Assessment & Plan Note (Signed)
Etiology unclear although she attributed it to a higher dose of Crestor. I advised to go back on 5 mg of Crestor and see if there will be any difference. EKG done today shows sinus rhythm. Labs obtained: TSH, CBC, Bmet to r/o other pathologies. If no improvement despite medication dose adjustment plan to obtain Holter monitoring and cardiology referral. F/U with PCP in 1-2 wks or sooner if symptoms worsens.

## 2015-03-12 ENCOUNTER — Emergency Department (HOSPITAL_COMMUNITY)
Admission: EM | Admit: 2015-03-12 | Discharge: 2015-03-12 | Disposition: A | Discharge status: Home / Self Care | Payer: 59 | Attending: Emergency Medicine | Admitting: Emergency Medicine

## 2015-03-12 ENCOUNTER — Encounter (HOSPITAL_COMMUNITY): Payer: Self-pay | Admitting: Emergency Medicine

## 2015-03-12 ENCOUNTER — Other Ambulatory Visit: Payer: Self-pay

## 2015-03-12 DIAGNOSIS — E119 Type 2 diabetes mellitus without complications: Secondary | ICD-10-CM | POA: Insufficient documentation

## 2015-03-12 DIAGNOSIS — Z7984 Long term (current) use of oral hypoglycemic drugs: Secondary | ICD-10-CM | POA: Insufficient documentation

## 2015-03-12 DIAGNOSIS — E785 Hyperlipidemia, unspecified: Secondary | ICD-10-CM | POA: Diagnosis not present

## 2015-03-12 DIAGNOSIS — I493 Ventricular premature depolarization: Secondary | ICD-10-CM | POA: Diagnosis not present

## 2015-03-12 DIAGNOSIS — R002 Palpitations: Secondary | ICD-10-CM

## 2015-03-12 DIAGNOSIS — Z8739 Personal history of other diseases of the musculoskeletal system and connective tissue: Secondary | ICD-10-CM | POA: Insufficient documentation

## 2015-03-12 DIAGNOSIS — Z79899 Other long term (current) drug therapy: Secondary | ICD-10-CM | POA: Insufficient documentation

## 2015-03-12 DIAGNOSIS — Z853 Personal history of malignant neoplasm of breast: Secondary | ICD-10-CM | POA: Diagnosis not present

## 2015-03-12 DIAGNOSIS — Z862 Personal history of diseases of the blood and blood-forming organs and certain disorders involving the immune mechanism: Secondary | ICD-10-CM | POA: Insufficient documentation

## 2015-03-12 DIAGNOSIS — E876 Hypokalemia: Secondary | ICD-10-CM | POA: Diagnosis not present

## 2015-03-12 DIAGNOSIS — I10 Essential (primary) hypertension: Secondary | ICD-10-CM | POA: Insufficient documentation

## 2015-03-12 LAB — CBC WITH DIFFERENTIAL/PLATELET
BASOS PCT: 1 %
Basophils Absolute: 0.1 10*3/uL (ref 0.0–0.1)
EOS ABS: 0.2 10*3/uL (ref 0.0–0.7)
Eosinophils Relative: 3 %
HCT: 40.6 % (ref 36.0–46.0)
HEMOGLOBIN: 13.1 g/dL (ref 12.0–15.0)
Lymphocytes Relative: 46 %
Lymphs Abs: 3.7 10*3/uL (ref 0.7–4.0)
MCH: 24.6 pg — ABNORMAL LOW (ref 26.0–34.0)
MCHC: 32.3 g/dL (ref 30.0–36.0)
MCV: 76.3 fL — ABNORMAL LOW (ref 78.0–100.0)
Monocytes Absolute: 0.5 10*3/uL (ref 0.1–1.0)
Monocytes Relative: 6 %
NEUTROS PCT: 45 %
Neutro Abs: 3.6 10*3/uL (ref 1.7–7.7)
Platelets: 107 10*3/uL — ABNORMAL LOW (ref 150–400)
RBC: 5.32 MIL/uL — AB (ref 3.87–5.11)
RDW: 15.1 % (ref 11.5–15.5)
WBC: 8.1 10*3/uL (ref 4.0–10.5)

## 2015-03-12 LAB — BASIC METABOLIC PANEL
ANION GAP: 11 (ref 5–15)
BUN: 11 mg/dL (ref 6–20)
CO2: 29 mmol/L (ref 22–32)
Calcium: 9.4 mg/dL (ref 8.9–10.3)
Chloride: 102 mmol/L (ref 101–111)
Creatinine, Ser: 0.8 mg/dL (ref 0.44–1.00)
GFR calc Af Amer: >60 mL/min (ref >60)
Glucose, Bld: 129 mg/dL — ABNORMAL HIGH (ref 65–99)
POTASSIUM: 3 mmol/L — AB (ref 3.5–5.1)
SODIUM: 142 mmol/L (ref 135–145)

## 2015-03-12 LAB — TROPONIN I

## 2015-03-12 MED ORDER — POTASSIUM CHLORIDE CRYS ER 20 MEQ PO TBCR: 40.0000 meq | EXTENDED_RELEASE_TABLET | Freq: Two times a day (BID) | ORAL | Status: DC

## 2015-03-12 NOTE — ED Notes (Signed)
Pt states she has been having heart palpitations for past two days. Pt was seen for same at [facility] clinic last Friday.

## 2015-03-12 NOTE — ED Provider Notes (Signed)
CSN: [id number]     Arrival date & time 03/12/15  0624 History   First MD Initiated Contact with Patient 03/12/15 [phone]     Chief Complaint  Patient presents with  . Palpitations     (Consider location/radiation/quality/duration/timing/severity/associated sxs/prior Treatment) Patient is a 55 y.o. female presenting with palpitations. The history is provided by the patient.  Palpitations She has been having palpitations for the last 2 days. She describes a fluttering in her chest that will be there for a few seconds and then resolved. There is no associated chest pain, heaviness, tightness, pressure. There is no associated dyspnea, nausea, vomiting, diaphoresis. She stated that symptoms were more likely to be present when she was up and moving, but also states that she had no problem during the day yesterday and symptoms started to bother her when she lay down to go to sleep last night. She had been seen in her 45 office 2 days ago for the same complaint.  Past Medical History  Diagnosis Date  . Hypertension   . Hyperlipidemia   . Thrombocytopenia ([place])   . Diabetes mellitus   . Arthritis   . Breast CA ([place])     breast CA- right breast  . Thyroid disease     parathyroid removed  . Heel spur     bilateral   Past Surgical History  Procedure Laterality Date  . Breast surgery  09/12/10    excision of skin mass - right breast, had another surgery to remove more Cancer cells  . Parathyroidectomy      partial?  . Abdominal hysterectomy      1996 per pt  . Inner ear surgery  06/01/13   Family History  Problem Relation Age of Onset  . Heart disease Mother   . Stroke Father   . Heart disease Brother   . Colon cancer Neg Hx   . Esophageal cancer Neg Hx   . Rectal cancer Neg Hx   . Stomach cancer Neg Hx    Social History  Substance Use Topics  . Smoking status: Never Smoker   . Smokeless tobacco: Never Used  . Alcohol Use: No   OB History    No data available      Review of Systems  Cardiovascular: Positive for palpitations.  All other systems reviewed and are negative.     Allergies  Review of patient's allergies indicates no known allergies.  Home Medications   Prior to Admission medications   Medication Sig Start Date End Date Taking? Authorizing Provider  Acetaminophen 650 MG TABS Take 1 tablet (650 mg total) by mouth 3 (three) times daily as needed. 04/26/12   Adlih Moreno-Coll, MD  amLODipine (NORVASC) 5 MG tablet Take 1 tablet (5 mg total) by mouth daily. 03/01/15   Olam Idler, MD  carvedilol (COREG) 25 MG tablet Take 1 tablet (25 mg total) by mouth 2 (two) times daily with a meal. 03/01/15   Olam Idler, MD  hydrochlorothiazide (HYDRODIURIL) 25 MG tablet Take 1 tablet (25 mg total) by mouth daily. 03/01/15   Olam Idler, MD  metFORMIN (GLUCOPHAGE-XR) 500 MG 24 hr tablet TAKE ONE TABLET BY MOUTH ONCE DAILY WITH BREAKFAST 01/26/15   Olam Idler, MD  OVER THE COUNTER MEDICATION Apply 1 application topically as needed (pain). Menthol semisolid stick.    Historical Provider, MD  rosuvastatin (CRESTOR) 5 MG tablet Take 1 tablet (5 mg total) by mouth daily. 02/17/15   Olam Idler, MD  tamoxifen (NOLVADEX) 20 MG tablet Take 1 tablet (20 mg total) by mouth daily. 12/12/14   Nicholas Lose, MD  traMADol (ULTRAM) 50 MG tablet Take 1 tablet (50 mg total) by mouth 2 (two) times daily as needed. 12/27/14   Olam Idler, MD   Pulse 76  Temp(Src) 98.6 F (37 C) (Oral)  SpO2 97% Physical Exam  Nursing note and vitals reviewed.  55 year old female, resting comfortably and in no acute distress. Vital signs are normal. Oxygen saturation is 97%, which is normal. Head is normocephalic and atraumatic. PERRLA, EOMI. Oropharynx is clear. Neck is nontender and supple without adenopathy or JVD. Back is nontender and there is no CVA tenderness. Lungs are clear without rales, wheezes, or rhonchi. Chest is nontender. Heart has regular rate and  rhythm without murmur. Abdomen is soft, flat, nontender without masses or hepatosplenomegaly and peristalsis is normoactive. Extremities have no cyanosis or edema, full range of motion is present. Skin is warm and dry without rash. Neurologic: Mental status is normal, cranial nerves are intact, there are no motor or sensory deficits.  ED Course  Procedures (including critical care time)    EKG Interpretation   Date/Time:  Sunday March 12 2015 06:33:14 EST Ventricular Rate:  80 PR Interval:  184 QRS Duration: 96 QT Interval:  388 QTC Calculation: 448 R Axis:   74 Text Interpretation:  Sinus rhythm Multiple ventricular premature  complexes Borderline T abnormalities, anterior leads When compared with  ECG of 03/10/2015, Premature ventricular complexes are now Present T wave  abnormality is now Present Confirmed by [facility]  MD, Judaea Burgoon ([id number]) on  03/12/2015 6:41:33 AM      MDM   Final diagnoses:  Hypokalemia  PVC's (premature ventricular contractions)  Palpitations    Subjective palpitations. While evaluating her, monitor showed frequent present. Old records were reviewed confirming office visit 2 days ago for dilation of palpitations. There was some discussion about whether her dose of rosuvastatin was related to palpitations. Laboratory workup showed potassium of 3.5 and she is on a diuretic. I suspect that her PVCs are related to her borderline low potassium, but these do not seem to be related to her sensation of palpitations. Screening labs be obtained. She may benefit from potassium supplementation, but I suspect she will need a Holter monitor for further evaluation. Case is signed out to Dr. Dayna Barker.    Delora Fuel, MD [id number]

## 2015-03-12 NOTE — ED Notes (Signed)
EDP at bedside  

## 2015-03-13 ENCOUNTER — Telehealth: Payer: Self-pay | Admitting: Family Medicine

## 2015-03-13 ENCOUNTER — Encounter: Payer: Self-pay | Admitting: Family Medicine

## 2015-03-13 NOTE — Telephone Encounter (Signed)
Tried to reach patient about test result. I was unable to reach her directly on the phone number provided on file. I will send home a result letter.  Lab significant for thrombocytopenia which is not new otherwise normal. This is likely due to the Tamoxifen she is on which is also likely cause of her transaminates.

## 2015-03-20 ENCOUNTER — Ambulatory Visit (INDEPENDENT_AMBULATORY_CARE_PROVIDER_SITE_OTHER): Payer: Medicare Other | Admitting: Family Medicine

## 2015-03-20 VITALS — BP 133/65 | HR 76 | Temp 98.2°F | Wt 193.3 lb

## 2015-03-20 DIAGNOSIS — E876 Hypokalemia: Secondary | ICD-10-CM | POA: Diagnosis not present

## 2015-03-20 DIAGNOSIS — R002 Palpitations: Secondary | ICD-10-CM

## 2015-03-20 DIAGNOSIS — R740 Nonspecific elevation of levels of transaminase and lactic acid dehydrogenase [LDH]: Secondary | ICD-10-CM | POA: Diagnosis not present

## 2015-03-20 DIAGNOSIS — I10 Essential (primary) hypertension: Secondary | ICD-10-CM

## 2015-03-20 DIAGNOSIS — E785 Hyperlipidemia, unspecified: Secondary | ICD-10-CM | POA: Diagnosis not present

## 2015-03-20 DIAGNOSIS — R7401 Elevation of levels of liver transaminase levels: Secondary | ICD-10-CM

## 2015-03-20 LAB — IRON AND TIBC
%SAT: 16 % (ref 11–50)
IRON: 58 ug/dL (ref 45–160)
TIBC: 363 ug/dL (ref 250–450)
UIBC: 305 ug/dL (ref 125–400)

## 2015-03-20 LAB — BASIC METABOLIC PANEL WITH GFR
BUN: 12 mg/dL (ref 7–25)
CALCIUM: 9.7 mg/dL (ref 8.6–10.4)
CHLORIDE: 101 mmol/L (ref 98–110)
CO2: 30 mmol/L (ref 20–31)
Creat: 0.77 mg/dL (ref 0.50–1.05)
GFR, Est African American: >89 mL/min (ref >=60)
GFR, Est Non African American: 87 mL/min (ref >=60)
Glucose, Bld: 98 mg/dL (ref 65–99)
POTASSIUM: 4.1 mmol/L (ref 3.5–5.3)
Sodium: 140 mmol/L (ref 135–146)

## 2015-03-20 NOTE — Assessment & Plan Note (Signed)
Unable to tolerate Crestor due to rash and upset stomach - Discontinue Crestor

## 2015-03-20 NOTE — Patient Instructions (Signed)
It was great seeing you today.  Your palpitations could be related to your low potassium.  They are may also be caused by something called atrial fibrillation  1. Stop taking hydrochlorothiazide.  2. I referred you to a cardiologist for evaluation of your palpitations.  They may need to do a heart monitor.  3. Stop taking the Crestor 4. Continue taking potassium supplements   Please bring all your medications to every doctors visit  Sign up for My Chart to have easy access to your labs results, and communication with your Primary care physician.  Next Appointment  Please make an appointment with Dr Berkley Harvey in 1-2 weeks for blood pressure and potassium recheck   I look forward to talking with you again at our next visit. If you have any questions or concerns before then, please call the clinic at [phone].  Take Care,   Dr Phill Myron

## 2015-03-20 NOTE — Assessment & Plan Note (Signed)
Discontinue hydrochlorothiazide today.  Continue K Dur 40 mg twice a day - Follow-up in 1-2 weeks for potassium recheck - Consider spironolactone if remains low

## 2015-03-20 NOTE — Assessment & Plan Note (Signed)
Continues to have intermittent but almost daily palpitations.  Possibly related to hypokalemia.  However, will refer to cardiology for possible Holter monitoring to evaluate for cardiac arrhythmia

## 2015-03-20 NOTE — Assessment & Plan Note (Signed)
Will recheck AST/ALT in several weeks once has been off statins.  Discontinued Crestor today - She has yet to hear from her oncologist about tamoxifen, which can also cause elevated LFTs

## 2015-03-20 NOTE — Progress Notes (Signed)
  Patient name: [name] MRN [id number]  Date of birth: 05/05/1960  CC & HPI:  Merlene Kelsh is a 55 y.o. female presenting today for palpitations.  She comes in today after being seen in the ED for palpitations and hypokalemia.  She continues to report palpitations lasting a few seconds to minutes, occurring 1-2 times daily for the past several weeks.  Denies any current palpitations, chest pain, shortness of breath.  She has had ongoing problems with hypokalemia and continues to take K Dur daily.  Her blood pressure has been well controlled on hydrochlorothiazide, Coreg and Norvasc for some time.  She does endorse worsening of her palpitations several weeks ago when she forgot to take her Coreg for several days.   Smoking history noted  Objective Findings:  Vitals: BP 133/65 mmHg  Pulse 76  Temp(Src) 98.2 F (36.8 C) (Oral)  Wt 193 lb 4.8 oz (87.68 kg)  Gen: NAD CV: RRR w/o m/r/g, pulses +2 b/l Resp: CTAB w/ normal respiratory effort  Assessment & Plan:   Essential hypertension Discontinue hydrochlorothiazide as this may be contributing to her hypokalemia - Follow-up in 1-2 weeks for blood pressure recheck.  She may need additional blood pressure medication - Continue Coreg 25 mg twice a day and amlodipine 5 mg daily  Hyperlipidemia Unable to tolerate Crestor due to rash and upset stomach - Discontinue Crestor   Transaminitis Will recheck AST/ALT in several weeks once has been off statins.  Discontinued Crestor today - She has yet to hear from her oncologist about tamoxifen, which can also cause elevated LFTs  Hypokalemia Discontinue hydrochlorothiazide today.  Continue K Dur 40 mg twice a day - Follow-up in 1-2 weeks for potassium recheck - Consider spironolactone if remains low  Palpitations Continues to have intermittent but almost daily palpitations.  Possibly related to hypokalemia.  However, will refer to cardiology for possible Holter monitoring to evaluate for  cardiac arrhythmia

## 2015-03-20 NOTE — Assessment & Plan Note (Signed)
Discontinue hydrochlorothiazide as this may be contributing to her hypokalemia - Follow-up in 1-2 weeks for blood pressure recheck.  She may need additional blood pressure medication - Continue Coreg 25 mg twice a day and amlodipine 5 mg daily

## 2015-03-21 LAB — FERRITIN: FERRITIN: 168 ng/mL (ref 10–232)

## 2015-03-22 ENCOUNTER — Other Ambulatory Visit: Payer: Self-pay

## 2015-03-22 DIAGNOSIS — Z1231 Encounter for screening mammogram for malignant neoplasm of breast: Secondary | ICD-10-CM

## 2015-03-23 ENCOUNTER — Telehealth: Payer: Self-pay

## 2015-03-23 NOTE — Telephone Encounter (Signed)
Returned [name] call, [name] following up - concern by her PCP that cancer meds may be causing her to have elevated LFTs.  Spoke with patient, let her know Dr. Lindi Adie had reviewed her ultrasound from January 18 and felt that her elevated LFTs were as a result of fatty liver: should be managed and followed by her PCP.    [name] voiced understanding.

## 2015-03-24 ENCOUNTER — Telehealth: Payer: Self-pay | Admitting: Family Medicine

## 2015-03-24 DIAGNOSIS — R748 Abnormal levels of other serum enzymes: Secondary | ICD-10-CM

## 2015-03-24 NOTE — Telephone Encounter (Signed)
Message given to pt

## 2015-03-24 NOTE — Telephone Encounter (Signed)
Pt called and would like to know what her lab results are. [name]

## 2015-03-24 NOTE — Telephone Encounter (Signed)
Attempt to call patient discuss results, but unable to reach by phone.  Letter sent.

## 2015-03-24 NOTE — Telephone Encounter (Signed)
Will forward to MD. Jazmin Hartsell,CMA  

## 2015-03-29 ENCOUNTER — Telehealth: Payer: Self-pay | Admitting: *Deleted

## 2015-03-29 DIAGNOSIS — R002 Palpitations: Secondary | ICD-10-CM

## 2015-03-29 NOTE — Telephone Encounter (Signed)
Patient states MD told her that he was putting in a cardiology referral and states she has'nt heard anything. No referrals in chart.

## 2015-03-29 NOTE — Telephone Encounter (Signed)
Will forward to MD to advise. Jazmin Hartsell,CMA  

## 2015-04-04 ENCOUNTER — Telehealth: Payer: Self-pay | Admitting: Family Medicine

## 2015-04-04 NOTE — Telephone Encounter (Signed)
Patient dropped off handicapped placard form to be filled out.  Please call her when it is ready to be picked up.

## 2015-04-05 ENCOUNTER — Encounter (HOSPITAL_COMMUNITY): Payer: Self-pay | Admitting: Emergency Medicine

## 2015-04-05 ENCOUNTER — Emergency Department (HOSPITAL_COMMUNITY)
Admission: EM | Admit: 2015-04-05 | Discharge: 2015-04-05 | Disposition: A | Discharge status: Home / Self Care | Payer: 59 | Attending: Emergency Medicine | Admitting: Emergency Medicine

## 2015-04-05 DIAGNOSIS — E119 Type 2 diabetes mellitus without complications: Secondary | ICD-10-CM | POA: Diagnosis not present

## 2015-04-05 DIAGNOSIS — Z79899 Other long term (current) drug therapy: Secondary | ICD-10-CM | POA: Diagnosis not present

## 2015-04-05 DIAGNOSIS — Z853 Personal history of malignant neoplasm of breast: Secondary | ICD-10-CM | POA: Insufficient documentation

## 2015-04-05 DIAGNOSIS — E785 Hyperlipidemia, unspecified: Secondary | ICD-10-CM | POA: Insufficient documentation

## 2015-04-05 DIAGNOSIS — Z8739 Personal history of other diseases of the musculoskeletal system and connective tissue: Secondary | ICD-10-CM | POA: Insufficient documentation

## 2015-04-05 DIAGNOSIS — Y92513 Shop (commercial) as the place of occurrence of the external cause: Secondary | ICD-10-CM | POA: Diagnosis not present

## 2015-04-05 DIAGNOSIS — I10 Essential (primary) hypertension: Secondary | ICD-10-CM | POA: Insufficient documentation

## 2015-04-05 DIAGNOSIS — X100XXA Contact with hot drinks, initial encounter: Secondary | ICD-10-CM | POA: Insufficient documentation

## 2015-04-05 DIAGNOSIS — T23102A Burn of first degree of left hand, unspecified site, initial encounter: Secondary | ICD-10-CM

## 2015-04-05 DIAGNOSIS — Z7984 Long term (current) use of oral hypoglycemic drugs: Secondary | ICD-10-CM | POA: Insufficient documentation

## 2015-04-05 DIAGNOSIS — D849 Immunodeficiency, unspecified: Secondary | ICD-10-CM | POA: Insufficient documentation

## 2015-04-05 DIAGNOSIS — Z79818 Long term (current) use of other agents affecting estrogen receptors and estrogen levels: Secondary | ICD-10-CM | POA: Insufficient documentation

## 2015-04-05 DIAGNOSIS — Y998 Other external cause status: Secondary | ICD-10-CM | POA: Insufficient documentation

## 2015-04-05 DIAGNOSIS — T23192A Burn of first degree of multiple sites of left wrist and hand, initial encounter: Secondary | ICD-10-CM | POA: Diagnosis not present

## 2015-04-05 DIAGNOSIS — Z862 Personal history of diseases of the blood and blood-forming organs and certain disorders involving the immune mechanism: Secondary | ICD-10-CM | POA: Insufficient documentation

## 2015-04-05 DIAGNOSIS — Y9389 Activity, other specified: Secondary | ICD-10-CM | POA: Diagnosis not present

## 2015-04-05 DIAGNOSIS — T310 Burns involving less than 10% of body surface: Secondary | ICD-10-CM | POA: Diagnosis not present

## 2015-04-05 NOTE — Discharge Instructions (Signed)
Use over-the-counter solarcaine or A&D ointment as needed for pain relief. Use tylenol and motrin as needed for additional relief. Use ice as needed for pain or swelling. Follow up with your regular doctor in 3-4 days for recheck of symptoms. Return to the ER for changes or worsening symptoms.   Burn Care Burns hurt your skin. When your skin is hurt, it is easier to get an infection. Follow your doctor's directions to help prevent an infection. HOME CARE  Wash your hands well before you change your bandage.  Change your bandage as often as told by your doctor.  Remove the old bandage. If the bandage sticks, soak it off with cool, clean water.  Gently clean the burn with mild soap and water.  Pat the burn dry with a clean, dry cloth.  Put a thin layer of medicated cream on the burn.  Put a clean bandage on as told by your doctor.  Keep the bandage clean and dry.  Raise (elevate) the burn for the first 24 hours. After that, follow your doctor's directions.  Only take medicine as told by your doctor. GET HELP RIGHT AWAY IF:   You have too much pain.  The skin near the burn is red, tender, puffy (swollen), or has red streaks.  The burn area has yellowish white fluid (pus) or a bad smell coming from it.  You have a fever. MAKE SURE YOU:   Understand these instructions.  Will watch your condition.  Will get help right away if you are not doing well or get worse.   This information is not intended to replace advice given to you by your health care provider. Make sure you discuss any questions you have with your health care provider.   Document Released: 10/31/2007 Document Revised: 04/15/2011 Document Reviewed: 06/13/2010 Elsevier Interactive Patient Education [employer].
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
## 2015-04-05 NOTE — Telephone Encounter (Signed)
Clinic portion completed and placed in providers box. Jazmin Hartsell,CMA  
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
## 2015-04-06 NOTE — Telephone Encounter (Signed)
Please call and have her make her appointment for hip pain evaluation.  Handicap sticker paperwork can be completed if deemed necessary at that appointment.

## 2015-04-06 NOTE — Telephone Encounter (Signed)
Patient has an appt on 04-13-15 with pcp. Jazmin Hartsell,CMA

## 2015-04-07 ENCOUNTER — Emergency Department (HOSPITAL_COMMUNITY): Payer: 59

## 2015-04-07 ENCOUNTER — Encounter (HOSPITAL_COMMUNITY): Payer: Self-pay | Admitting: *Deleted

## 2015-04-07 ENCOUNTER — Emergency Department (HOSPITAL_COMMUNITY)
Admission: EM | Admit: 2015-04-07 | Discharge: 2015-04-07 | Disposition: A | Discharge status: Home / Self Care | Payer: 59 | Attending: Emergency Medicine | Admitting: Emergency Medicine

## 2015-04-07 DIAGNOSIS — M25561 Pain in right knee: Secondary | ICD-10-CM

## 2015-04-07 DIAGNOSIS — Z853 Personal history of malignant neoplasm of breast: Secondary | ICD-10-CM | POA: Diagnosis not present

## 2015-04-07 DIAGNOSIS — M1990 Unspecified osteoarthritis, unspecified site: Secondary | ICD-10-CM | POA: Insufficient documentation

## 2015-04-07 DIAGNOSIS — Z7984 Long term (current) use of oral hypoglycemic drugs: Secondary | ICD-10-CM | POA: Diagnosis not present

## 2015-04-07 DIAGNOSIS — E119 Type 2 diabetes mellitus without complications: Secondary | ICD-10-CM | POA: Diagnosis not present

## 2015-04-07 DIAGNOSIS — Z862 Personal history of diseases of the blood and blood-forming organs and certain disorders involving the immune mechanism: Secondary | ICD-10-CM | POA: Diagnosis not present

## 2015-04-07 DIAGNOSIS — I10 Essential (primary) hypertension: Secondary | ICD-10-CM | POA: Insufficient documentation

## 2015-04-07 DIAGNOSIS — Z79899 Other long term (current) drug therapy: Secondary | ICD-10-CM | POA: Insufficient documentation

## 2015-04-07 MED ORDER — OXYCODONE-ACETAMINOPHEN 5-325 MG PO TABS: 1.0000 | ORAL_TABLET | ORAL | Status: DC | PRN

## 2015-04-07 MED ORDER — ACETAMINOPHEN 500 MG PO TABS
1000.0000 mg | ORAL_TABLET | Freq: Once | ORAL | Status: AC
  Administered 2015-04-07: 1000 mg via ORAL
  Filled 2015-04-07: qty 2

## 2015-04-07 NOTE — Discharge Instructions (Signed)
You have been seen today for knee pain. Your imaging showed no abnormalities. Follow up with orthopedics should symptoms continue. Follow up with PCP as needed. Return to ED should symptoms worsen. Tylenol may be used for pain management.

## 2015-04-07 NOTE — ED Notes (Signed)
Pt reports rt knee pain x 2 weeks, no injury noted, nothing OTC will allow relief

## 2015-04-07 NOTE — ED Provider Notes (Signed)
CSN: [id number]     Arrival date & time 04/07/15  1042 History  By signing my name below, I, [name], attest that this documentation has been prepared under the direction and in the presence of Danielle Joy, PA-C. Electronically Signed: Judithann Lawson, ED Scribe. 04/07/2015. 1:15 PM.    Chief Complaint  Patient presents with  . Knee Pain    rt   The history is provided by the patient. No language interpreter was used.   HPI Comments: [name] is a 55 y.o. female with a hx of HTN, HLD, DM, arthritis, thrombocytopenia,  breast CA, and thyroid disease who presents to the Emergency Department complaining of gradually worsening intermittent 7/10 non-radiating burning right knee pain onset 2 weeks ago. She explains her pain as a "spasm". She states that the pain is worse when she ambulates. No alleviating factors noted. She denies any recent falls, injuries, or trauma. Patient notes that she has had discomfort in this knee previously, but this time the symptoms are not relieved with Tylenol. She states that she cannot have ibuprofen or aspirin due to her hx of thrombocytopenia. No fever, chills, swelling to the knee, rash, wounds, neuro deficits, or any other complaints.    Past Medical History  Diagnosis Date  . Hypertension   . Hyperlipidemia   . Thrombocytopenia ([place])   . Diabetes mellitus   . Arthritis   . Breast CA ([place])     breast CA- right breast  . Thyroid disease     parathyroid removed  . Heel spur     bilateral   Past Surgical History  Procedure Laterality Date  . Breast surgery  09/12/10    excision of skin mass - right breast, had another surgery to remove more Cancer cells  . Parathyroidectomy      partial?  . Abdominal hysterectomy      1996 per pt  . Inner ear surgery  06/01/13   Family History  Problem Relation Age of Onset  . Heart disease Mother   . Stroke Father   . Heart disease Brother   . Colon cancer Neg Hx   . Esophageal cancer Neg Hx   .  Rectal cancer Neg Hx   . Stomach cancer Neg Hx    Social History  Substance Use Topics  . Smoking status: Never Smoker   . Smokeless tobacco: Never Used  . Alcohol Use: No   OB History    No data available     Review of Systems  Constitutional: Negative for fever and chills.  Musculoskeletal: Positive for arthralgias (right knee). Negative for joint swelling.  Skin: Negative for color change, rash and wound.  Neurological: Negative for weakness and numbness.      Allergies  Review of patient's allergies indicates no known allergies.  Home Medications   Prior to Admission medications   Medication Sig Start Date End Date Taking? Authorizing Provider  amLODipine (NORVASC) 5 MG tablet Take 1 tablet (5 mg total) by mouth daily. 03/01/15  Yes Danielle Idler, MD  carvedilol (COREG) 25 MG tablet Take 1 tablet (25 mg total) by mouth 2 (two) times daily with a meal. 03/01/15  Yes Danielle Idler, MD  Menthol-Methyl Salicylate (MUSCLE RUB) 10-15 % CREA Apply 1 application topically as needed for muscle pain.   Yes Historical Provider, MD  metFORMIN (GLUCOPHAGE-XR) 500 MG 24 hr tablet TAKE ONE TABLET BY MOUTH ONCE DAILY WITH BREAKFAST Patient taking differently: Take 500 mg by mouth once daily  with breakfast 01/26/15  Yes Danielle Idler, MD  tamoxifen (NOLVADEX) 20 MG tablet Take 1 tablet (20 mg total) by mouth daily. 12/12/14  Yes Danielle Lose, MD  traMADol (ULTRAM) 50 MG tablet Take 1 tablet (50 mg total) by mouth 2 (two) times daily as needed. Patient taking differently: Take 50 mg by mouth 2 (two) times daily as needed for moderate pain.  12/27/14  Yes Danielle Idler, MD  oxyCODONE-acetaminophen (PERCOCET/ROXICET) 5-325 MG tablet Take 1 tablet by mouth every 4 (four) hours as needed for severe pain. 04/07/15   Danielle C Joy, PA-C   BP 135/64 mmHg  Pulse 72  Temp(Src) 97.9 F (36.6 C) (Oral)  Resp 16  SpO2 99% Physical Exam  Constitutional: She is oriented to person, place, and time. She  appears well-developed and well-nourished.  HENT:  Head: Normocephalic and atraumatic.  Eyes: Conjunctivae are normal.  Cardiovascular: Normal rate and intact distal pulses.   Pulmonary/Chest: Effort normal.  Abdominal: She exhibits no distension.  Musculoskeletal: She exhibits tenderness.  Right knee: Full ROM with pain in right knee, neurovascularly intact distally. Tenderness to anterior patella, no swelling, erythema, increased warmth, or notable effusion. No laxity or crepitus. No pain, swelling, or erythema in the upper or lower leg.  Neurological: She is alert and oriented to person, place, and time. She has normal reflexes.  Right knee: Strength 5/5, no sensory deficit. Patient readily ambulates without difficulty.  Skin: Skin is warm and dry.  Psychiatric: She has a normal mood and affect.  Nursing note and vitals reviewed.   ED Course  Procedures (including critical care time) DIAGNOSTIC STUDIES: Oxygen Saturation is 98% on RA, normal by my interpretation.    COORDINATION OF CARE: 12:52 PM- Pt advised of plan for treatment and pt agrees. Pt will receive x-ray for further evaluation. She will also receive Tylenol, knee sleeve, and crutches.    Imaging Review Dg Knee Complete 4 Views Right  04/07/2015  CLINICAL DATA:  Pain for 2 weeks EXAM: RIGHT KNEE - COMPLETE 4+ VIEW COMPARISON:  None. FINDINGS: Frontal, lateral, and bilateral oblique views were obtained. There is no demonstrable fracture or dislocation. No appreciable joint effusion. There is mild spurring laterally and arising from the inferior patella. No appreciable joint space narrowing. No erosive change. IMPRESSION: Slight osteoarthritic change.  No fracture or joint effusion. Electronically Signed   By: Lowella Grip III M.D.   On: 04/07/2015 12:23   Danielle Hopping, PA-C has personally reviewed and evaluated these images as part of her medical decision-making.  MDM   Final diagnoses:  Knee pain, acute, right     [name] presents with right knee pain for the last 2 weeks.  No notable effusion that would benefit from therapeutic arthrocentesis. Very low suspicion for septic joint. No functional or neuro deficits. Patient readily ambulates. No red flag symptoms. I suspect that the patient's pain may be due to arthritic changes. Arthritic changes noted on x-ray, no fracture, dislocation, or effusion. Patient has an upcoming PCP appointment on March 9. Patient was also given referral to orthopedics should symptoms continue. Home care and return precautions discussed. Patient voiced understanding of these instructions, accepts the plan, and is comfortable with discharge.  Filed Vitals:   04/07/15 1046 04/07/15 1317  BP: 168/59 135/64  Pulse: 84 72  Temp: 97.9 F (36.6 C)   TempSrc: Oral   Resp: 16 16  SpO2: 98% 99%     I personally performed the services described in this  documentation, which was scribed in my presence. The recorded information has been reviewed and is accurate.   Lorayne Bender, PA-C 04/08/15 [place], MD 04/17/15 0021

## 2015-04-13 ENCOUNTER — Ambulatory Visit: Payer: BLUE CROSS/BLUE SHIELD | Admitting: Family Medicine

## 2015-04-16 NOTE — Progress Notes (Signed)
Cardiology Office Note   Date:  04/17/2015   ID:  [name], DOB [birth year], MRN [id number]  PCP:  Phill Myron, MD  Cardiologist:   Minus Breeding, MD   Chief Complaint  Patient presents with  . Palpitations      History of Present Illness: [name] is a 55 y.o. female who presents for evaluation of palpitations. She was having this in January of this year. It sounds like she was out of her beta blocker for a while. Subsequently she was found to have a low potassium. When these issues were corrected she said palpitations resolved. She described him as strong beats. She does not describe particularly fast rhythm were irregular rhythm. She did not have presyncope or syncope. She denies any chest pressure, neck or arm discomfort. She's had no weight gain.  She has some mild lower extremity edema. She does have some shortness of breath with activities but does not describe PND or orthopnea. I did see a previous echocardiogram from last year with an EF of 50-55% but no other significant abnormalities.   Past Medical History  Diagnosis Date  . Hypertension   . Hyperlipidemia   . Thrombocytopenia ([place])   . Diabetes mellitus   . Arthritis   . Breast CA ([place])     breast CA- right breast  . Thyroid disease     parathyroid removed  . Heel spur     bilateral    Past Surgical History  Procedure Laterality Date  . Breast surgery  09/12/10    excision of skin mass - right breast, had another surgery to remove more Cancer cells  . Parathyroidectomy      partial?  . Abdominal hysterectomy      1996 per pt  . Inner ear surgery  06/01/13     Current Outpatient Prescriptions  Medication Sig Dispense Refill  . amLODipine (NORVASC) 5 MG tablet Take 1 tablet (5 mg total) by mouth daily. 30 tablet 4  . carvedilol (COREG) 25 MG tablet Take 1 tablet (25 mg total) by mouth 2 (two) times daily with a meal. 60 tablet 4  . Menthol-Methyl Salicylate (MUSCLE RUB) 10-15 % CREA Apply 1  application topically as needed for muscle pain.    . metFORMIN (GLUCOPHAGE-XR) 500 MG 24 hr tablet TAKE ONE TABLET BY MOUTH ONCE DAILY WITH BREAKFAST (Patient taking differently: Take 500 mg by mouth once daily with breakfast) 90 tablet 0  . oxyCODONE-acetaminophen (PERCOCET/ROXICET) 5-325 MG tablet Take 1 tablet by mouth every 4 (four) hours as needed for severe pain. 6 tablet 0  . tamoxifen (NOLVADEX) 20 MG tablet Take 1 tablet (20 mg total) by mouth daily. 90 tablet 3  . traMADol (ULTRAM) 50 MG tablet Take 1 tablet (50 mg total) by mouth 2 (two) times daily as needed. (Patient taking differently: Take 50 mg by mouth 2 (two) times daily as needed for moderate pain. ) 60 tablet 0  . pravastatin (PRAVACHOL) 40 MG tablet Take 1 tablet (40 mg total) by mouth every evening. 30 tablet 6  . [DISCONTINUED] cetirizine (ZYRTEC) 10 MG chewable tablet Chew 1 tablet (10 mg total) by mouth daily. 15 tablet 0  . [DISCONTINUED] hydrochlorothiazide (HYDRODIURIL) 25 MG tablet Take 1 tablet (25 mg total) by mouth daily. (Patient not taking: Reported on 04/07/2015) 90 tablet 1  . [DISCONTINUED] rosuvastatin (CRESTOR) 5 MG tablet Take 1 tablet (5 mg total) by mouth daily. (Patient not taking: Reported on 04/07/2015) 30 tablet 1  No current facility-administered medications for this visit.    Allergies:   Review of patient's allergies indicates no known allergies.    Social History:  The patient  reports that she has never smoked. She has never used smokeless tobacco. She reports that she does not drink alcohol or use illicit drugs.   Family History:  The patient's family history includes CAD (age of onset: 3) in her mother; Heart attack (age of onset: 63) in her brother; Stroke in her father. There is no history of Colon cancer, Esophageal cancer, Rectal cancer, or Stomach cancer.    ROS:  Please see the history of present illness.   Otherwise, review of systems are positive for none.   All other systems are  reviewed and negative.    PHYSICAL EXAM: VS:  BP 150/100 mmHg  Pulse 80  Ht 5\' 3"  (1.6 m)  Wt 200 lb (90.719 kg)  BMI 35.44 kg/m2 , BMI Body mass index is 35.44 kg/(m^2). GENERAL:  Well appearing HEENT:  Pupils equal round and reactive, fundi not visualized, oral mucosa unremarkable NECK:  No jugular venous distention, waveform within normal limits, carotid upstroke brisk and symmetric, no bruits, no thyromegaly LYMPHATICS:  No cervical, inguinal adenopathy LUNGS:  Clear to auscultation bilaterally BACK:  No CVA tenderness CHEST:  Unremarkable HEART:  PMI not displaced or sustained,S1 and S2 within normal limits, no S3, no S4, no clicks, no rubs, no murmurs ABD:  Flat, positive bowel sounds normal in frequency in pitch, no bruits, no rebound, no guarding, no midline pulsatile mass, no hepatomegaly, no splenomegaly EXT:  2 plus pulses throughout, no edema, no cyanosis no clubbing SKIN:  No rashes no nodules NEURO:  Cranial nerves II through XII grossly intact, motor grossly intact throughout PSYCH:  Cognitively intact, oriented to person place and time    EKG:  EKG is not ordered today. The ekg ordered 2/5/17demonstrates sinus rhythm, rate 80, premature ectopic complexes, no acute ST-T wave changes.   Recent Labs: 02/17/2015: ALT 53* 03/10/2015: TSH 1.539 03/12/2015: Hemoglobin 13.1; Platelets 107* 03/20/2015: BUN 12; Creat 0.77; Potassium 4.1; Sodium 140    Lipid Panel    Component Value Date/Time   CHOL 218* 02/11/2014 0915   TRIG 334* 02/11/2014 0915   HDL 54 02/11/2014 0915   CHOLHDL 4.0 02/11/2014 0915   VLDL 67* 02/11/2014 0915   LDLCALC 97 02/11/2014 0915     Lab Results  Component Value Date   HGBA1C 6.5 02/17/2015    Wt Readings from Last 3 Encounters:  04/17/15 200 lb (90.719 kg)  03/20/15 193 lb 4.8 oz (87.68 kg)  03/10/15 191 lb (86.637 kg)      Other studies Reviewed: Additional studies/ records that were reviewed today include: EKG, echo and previous  office records. Review of the above records demonstrates:  Please see elsewhere in the note.     ASSESSMENT AND PLAN:  PALPITATIONS:  These are no longer problematic. No further evaluation is indicated.  DYSPNEA:  She does have some dyspnea. She has significant cardiovascular risk factors. I will bring the patient back for a POET (Plain Old Exercise Test). This will allow me to screen for obstructive coronary disease, risk stratify and very importantly provide a prescription for exercise.  HTN:  Her BP is slightly elevated.  She says it is well controlled at home. I will evaluate this at the time of her POET (Plain Old Exercise Treadmill).  If her blood pressure is elevated at home or elevated at the  time of the stress test I would suggest starting spironolactone 25 mg daily.  DYSLIPIDEMIA:  She was intolerant of Crestor.   I will start pravastatin 40 mg daily.   Current medicines are reviewed at length with the patient today.  The patient does not have concerns regarding medicines.  The following changes have been made:  See above  Labs/ tests ordered today include:   Orders Placed This Encounter  Procedures  . Exercise Tolerance Test     Disposition:   FU with me as needed.     Signed, Minus Breeding, MD  04/17/2015 9:24 AM    [facility]

## 2015-04-17 ENCOUNTER — Encounter: Payer: Self-pay | Admitting: Cardiology

## 2015-04-17 ENCOUNTER — Ambulatory Visit (INDEPENDENT_AMBULATORY_CARE_PROVIDER_SITE_OTHER): Payer: 59 | Admitting: Cardiology

## 2015-04-17 VITALS — BP 150/100 | HR 80 | Ht 63.0 in | Wt 200.0 lb

## 2015-04-17 DIAGNOSIS — R0600 Dyspnea, unspecified: Secondary | ICD-10-CM

## 2015-04-17 MED ORDER — PRAVASTATIN SODIUM 40 MG PO TABS: 40.0000 mg | ORAL_TABLET | Freq: Every evening | ORAL | Status: DC

## 2015-04-17 NOTE — Patient Instructions (Signed)
Your physician recommends that you schedule a follow-up appointment in: As Needed  Your physician has recommended you make the following change in your medication: START Pravastatin 40 mg at night time  Your physician has requested that you have an exercise tolerance test. For further information please visit [email or web address]. Please also follow instruction sheet, as given.

## 2015-04-18 ENCOUNTER — Encounter: Payer: Self-pay | Admitting: Family Medicine

## 2015-04-18 ENCOUNTER — Ambulatory Visit (INDEPENDENT_AMBULATORY_CARE_PROVIDER_SITE_OTHER): Payer: 59 | Admitting: Family Medicine

## 2015-04-18 VITALS — BP 138/84 | HR 79 | Temp 98.6°F | Ht 63.0 in | Wt 201.0 lb

## 2015-04-18 DIAGNOSIS — M25551 Pain in right hip: Secondary | ICD-10-CM | POA: Diagnosis not present

## 2015-04-18 DIAGNOSIS — R600 Localized edema: Secondary | ICD-10-CM | POA: Diagnosis not present

## 2015-04-18 DIAGNOSIS — E876 Hypokalemia: Secondary | ICD-10-CM | POA: Diagnosis not present

## 2015-04-18 DIAGNOSIS — M25561 Pain in right knee: Secondary | ICD-10-CM

## 2015-04-18 DIAGNOSIS — E785 Hyperlipidemia, unspecified: Secondary | ICD-10-CM | POA: Diagnosis not present

## 2015-04-18 DIAGNOSIS — M150 Primary generalized (osteo)arthritis: Secondary | ICD-10-CM

## 2015-04-18 DIAGNOSIS — I10 Essential (primary) hypertension: Secondary | ICD-10-CM

## 2015-04-18 DIAGNOSIS — R739 Hyperglycemia, unspecified: Secondary | ICD-10-CM | POA: Diagnosis not present

## 2015-04-18 DIAGNOSIS — R7309 Other abnormal glucose: Secondary | ICD-10-CM | POA: Diagnosis not present

## 2015-04-18 DIAGNOSIS — R740 Nonspecific elevation of levels of transaminase and lactic acid dehydrogenase [LDH]: Secondary | ICD-10-CM | POA: Diagnosis not present

## 2015-04-18 DIAGNOSIS — M159 Polyosteoarthritis, unspecified: Secondary | ICD-10-CM

## 2015-04-18 MED ORDER — HYDROCHLOROTHIAZIDE 25 MG PO TABS
25.0000 mg | ORAL_TABLET | Freq: Every day | ORAL | Status: DC
Start: 2015-04-18 — End: 2015-06-27

## 2015-04-18 MED ORDER — TRAMADOL HCL 50 MG PO TABS: 50.0000 mg | ORAL_TABLET | Freq: Two times a day (BID) | ORAL | Status: DC | PRN

## 2015-04-18 NOTE — Assessment & Plan Note (Signed)
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  Likely due to compensation from right knee OA - Instructed on home exercise program - Patient declined steroid injection - Refilled her Ultram # 30; given that she is unable to take Tylenol due to nonalcoholic fatty liver disease and unable to take NSAIDs due to thrombocytopenia - f/u in 3-4 weeks if not improving

## 2015-04-18 NOTE — Progress Notes (Signed)
  Patient name: [name] MRN [id number]  Date of birth: 01/25/1961  CC & HPI:  [name] is a 55 y.o. female presenting today for right hip pain, right knee pain, hypertension.   CHRONIC HYPERTENSION  BP Readings from Last 3 Encounters:  04/18/15 138/84  04/17/15 150/100  04/07/15 135/64    Control: fair Disease Monitoring  Blood pressure range outside clinc: 145-150/60-80s  Chest pain: no   Dyspnea: no   Claudication: no  Medication compliance: yes  Medication Side Effects: no Dizziness/lightheadedness;    Preventitive Healthcare:   History  Smoking status  . Never Smoker   Smokeless tobacco  . Never Used   Right hip pain - Continues report right lateral hip pain; with radiation down the lateral leg to knee.  Pain worse with walking and exercise.  Worse with lying on the affected side - Denies fevers, chills, trauma - Has been using Ultram 1-2 pills a week as needed for severe pain - Has not been compliant with home exercise program previously prescribed  Right knee pain - Reports right anterior knee pain, burning, aching in sensation.  - Pain worse first thing in the morning associated with stiffness that improves with activity than 30 minutes - Pain worse with long periods of sitting and walking up and down stairs - Denies catching, locking, giving way - No previous trauma or surgeries - She request a handicap sticker today due to pain with ambulation   Smoking History Noted  Objective Findings:  Vitals: BP 138/84 mmHg  Pulse 79  Temp(Src) 98.6 F (37 C) (Oral)  Ht 5\' 3"  (1.6 m)  Wt 201 lb (91.173 kg)  BMI 35.61 kg/m2  Gen: NAD CV: RRR w/o m/r/g, pulses +2 b/l Resp: CTAB w/ normal respiratory effort Right hip: Point tenderness over right greater trochanter.  Negative Wayland Denis.  Hip abduction weakness Right knee: Diffuse anterior tenderness, greater over the medial joint line.  No effusion.  Moderate crepitus with extension.  Strength and  sensation intact  Assessment & Plan:   Essential hypertension BP mildly elevated.  Due to diagnosis of diabetes. Goal blood pressure less than 130/80 - Continue Coreg 25 mg twice a day, Norvasc 5 mg daily - Start hydrochlorothiazide 25 mg daily - Continue home blood pressure log - Follow-up in 2-3 weeks  Right anterior knee pain Reviewed right knee x-rays without sunrise view.  History physical exam is consistent with patellofemoral osteoarthritis versus possible pes anserine bursitis - Recommended home exercise program: Biking, water aerobics, straight leg extensions - Discussed physical therapy.  Patient declined - Recommend a steroid injection.  Patient declined; she will follow-up with Dr. Micheline Chapman in sports medicine if pain not improving in 2-3 weeks - Provided with a handicap sticker; temporarily 3 months - Follow-up if no improvement in 4-6 weeks; consider additional x-rays with sunrise view versus MRI to assess for chondromalacia  Lateral pain of right hip Pain most consistent with right greater trochanteric pain syndrome.  Likely due to compensation from right knee OA - Instructed on home exercise program - Patient declined steroid injection - Refilled her Ultram # 30; given that she is unable to take Tylenol due to nonalcoholic fatty liver disease and unable to take NSAIDs due to thrombocytopenia - f/u in 3-4 weeks if not improving

## 2015-04-18 NOTE — Assessment & Plan Note (Signed)
BP mildly elevated.  Due to diagnosis of diabetes. Goal blood pressure less than 130/80 - Continue Coreg 25 mg twice a day, Norvasc 5 mg daily - Start hydrochlorothiazide 25 mg daily - Continue home blood pressure log - Follow-up in 2-3 weeks

## 2015-04-18 NOTE — Assessment & Plan Note (Signed)
Reviewed right knee x-rays without sunrise view.  History physical exam is consistent with patellofemoral osteoarthritis versus possible pes anserine bursitis - Recommended home exercise program: Biking, water aerobics, straight leg extensions - Discussed physical therapy.  Patient declined - Recommend a steroid injection.  Patient declined; she will follow-up with Dr. Micheline Chapman in sports medicine if pain not improving in 2-3 weeks - Provided with a handicap sticker; temporarily 3 months - Follow-up if no improvement in 4-6 weeks; consider additional x-rays with sunrise view versus MRI to assess for chondromalacia

## 2015-04-18 NOTE — Patient Instructions (Signed)
Your knee pain is most likely due to osteoarthritis, and her hip pain is due to greater trochanteric bursitis.  Hip and knee strengthening exercises will help improve this pain.  She may try these exercises at home on her own.  Call if you're not seeing improvement in the next 3-4 weeks, and we will discuss referral to physical therapy.  - Take ibuprofen 400-600 mg 3 times a day for the next 3 days - Refill Ultram: you may take 1-2 tabs a day as needed for breakthrough pain - I strongly recommend you consider steroid injections for both her hip and knee pain, and you can follow-up here or the sports medicine clinic - Follow-up in 4-6 weeks if your pain is not improving

## 2015-04-27 ENCOUNTER — Inpatient Hospital Stay: Admission: RE | Admit: 2015-04-27 | Payer: BLUE CROSS/BLUE SHIELD | Source: Ambulatory Visit

## 2015-04-27 ENCOUNTER — Ambulatory Visit
Admission: RE | Admit: 2015-04-27 | Discharge: 2015-04-27 | Disposition: A | Discharge status: Home / Self Care | Payer: 59 | Source: Ambulatory Visit

## 2015-04-27 DIAGNOSIS — Z1231 Encounter for screening mammogram for malignant neoplasm of breast: Secondary | ICD-10-CM

## 2015-05-03 ENCOUNTER — Telehealth (HOSPITAL_COMMUNITY): Payer: Self-pay

## 2015-05-03 NOTE — Telephone Encounter (Signed)
Encounter complete. 

## 2015-05-04 ENCOUNTER — Telehealth (HOSPITAL_COMMUNITY): Payer: Self-pay

## 2015-05-04 NOTE — Telephone Encounter (Signed)
Encounter complete. 

## 2015-05-05 ENCOUNTER — Ambulatory Visit (HOSPITAL_COMMUNITY)
Admission: RE | Admit: 2015-05-05 | Discharge: 2015-05-05 | Disposition: A | Discharge status: Home / Self Care | Payer: 59 | Source: Ambulatory Visit | Attending: Cardiology | Admitting: Cardiology

## 2015-05-05 DIAGNOSIS — R0600 Dyspnea, unspecified: Secondary | ICD-10-CM

## 2015-05-05 LAB — EXERCISE TOLERANCE TEST
CSEPED: 6 min
CSEPPHR: 164 {beats}/min
Estimated workload: 7 METS
MPHR: 165 {beats}/min
Percent HR: 99 %
RPE: 16
Rest HR: 95 {beats}/min

## 2015-05-08 ENCOUNTER — Telehealth: Payer: Self-pay | Admitting: *Deleted

## 2015-05-08 ENCOUNTER — Telehealth: Payer: Self-pay | Admitting: Cardiology

## 2015-05-08 DIAGNOSIS — Z79899 Other long term (current) drug therapy: Secondary | ICD-10-CM

## 2015-05-08 MED ORDER — SPIRONOLACTONE 25 MG PO TABS: 25.0000 mg | ORAL_TABLET | Freq: Every day | ORAL | Status: DC

## 2015-05-08 NOTE — Telephone Encounter (Signed)
PATIENT STATES SHE RECEIVED RESULTS OF STRESS TEST

## 2015-05-08 NOTE — Telephone Encounter (Signed)
Spoke with pt about her stress test, Spironolactone 25 mg #90 3 refills was send into pt pharmacy BMP was ordered and mailed t pt to get done in 2 weeks

## 2015-05-08 NOTE — Telephone Encounter (Signed)
New message ° ° ° ° °Returning a call to the nurse °

## 2015-05-08 NOTE — Telephone Encounter (Signed)
-----   Message from Minus Breeding, MD sent at 05/07/2015  2:16 PM EDT ----- Negative stress test.  Hypertensive response.  Start spironolactone and repeat bmet in 2 weeks.  Spironolactone 25 mg po daily disp number 90 with 3 refills.  Call Ms. Kindred with the results and send results to Phill Myron, MD

## 2015-05-11 ENCOUNTER — Ambulatory Visit
Admission: RE | Admit: 2015-05-11 | Discharge: 2015-05-11 | Disposition: A | Discharge status: Home / Self Care | Payer: 59 | Source: Ambulatory Visit | Attending: Family Medicine | Admitting: Family Medicine

## 2015-05-11 ENCOUNTER — Ambulatory Visit (INDEPENDENT_AMBULATORY_CARE_PROVIDER_SITE_OTHER): Payer: 59 | Admitting: Sports Medicine

## 2015-05-11 ENCOUNTER — Encounter: Payer: Self-pay | Admitting: Sports Medicine

## 2015-05-11 VITALS — BP 146/65 | Ht 63.0 in | Wt 180.0 lb

## 2015-05-11 DIAGNOSIS — M25561 Pain in right knee: Secondary | ICD-10-CM | POA: Diagnosis not present

## 2015-05-11 NOTE — Progress Notes (Signed)
  [name] - 55 y.o. female MRN [id number]  Date of birth: Aug 12, 1960  SUBJECTIVE:  Including CC & ROS.  [name] is a 55 y.o. female who presents today for R knee pain.    Knee Pain R, initial visit 05/11/15 - Ongoing for 2-3 months with no injury.  No effusion but states achy pain worse at end of day.  Located medial along R knee with no locking/catching/giving way or previous injury.  Has tried tylenol and ice with minimal relief.  Was seen in ED 04/07/15 and given tramadol and had x-rays which showed some OA changes.    PMHx - Updated and reviewed.  Contributory factors include: HTN, HLD, DM II, Breast CA PSHx - Updated and reviewed.  Contributory factors include:  Parathyroidectomy  FHx - Updated and reviewed.  Contributory factors include:  CAD maternal  Social Hx - Updated and reviewed. Contributory factors include: Non smoker   Medications - Reviewed   12 point ROS negative other than per HPI.   Exam:  Filed Vitals:   05/11/15 1023  BP: 146/65   Gen: NAD, AAO 3 Cardio- RRR Pulm - Normal respiratory effort/rate Skin: No rashes or erythema Extremities: No edema  Vascular: pulses +2 bilateral upper and lower extremity Psych: Normal affect   R Knee:  Normal to inspection with no erythema or effusion or obvious bony abnormalities.  No obvious Baker's cysts TTP medial joint line with +1 crepitus  ROM normal in flexion (135 degrees) and extension (0 degrees) and lower leg rotation. Ligaments with solid consistent endpoints including ACL, PCL, LCL, MCL.  Negative Anterior Drawer/Lachman/Pivot Shift Negative Mcmurray's and provocative meniscal tests including Thessaly and Apley compression testing  Patellar and quadriceps tendons unremarkable. Hamstring and quadriceps strength is normal.  Neurovascularly intact B/L LE   Imaging:  3 view non standing films showing slight JSN medial knee R.

## 2015-05-11 NOTE — Assessment & Plan Note (Signed)
Suspect she has bicompartmental OA including PF and medial joint.  Will send for standing films and a true sunrise.   - Injection offered but she will wait until after x-rays.  This is most likely DJD or degenerative meniscus causing her Sx. - Can't take NSAIDS 2/2 thrombocytopenia

## 2015-05-14 ENCOUNTER — Other Ambulatory Visit: Payer: Self-pay | Admitting: Family Medicine

## 2015-06-05 DIAGNOSIS — Z79899 Other long term (current) drug therapy: Secondary | ICD-10-CM | POA: Diagnosis not present

## 2015-06-06 LAB — BASIC METABOLIC PANEL
BUN: 10 mg/dL (ref 7–25)
CHLORIDE: 105 mmol/L (ref 98–110)
CO2: 25 mmol/L (ref 20–31)
CREATININE: 0.76 mg/dL (ref 0.50–1.05)
Calcium: 9.1 mg/dL (ref 8.6–10.4)
GLUCOSE: 93 mg/dL (ref 65–99)
POTASSIUM: 4.2 mmol/L (ref 3.5–5.3)
Sodium: 140 mmol/L (ref 135–146)

## 2015-06-17 ENCOUNTER — Encounter (HOSPITAL_COMMUNITY): Payer: Self-pay

## 2015-06-17 ENCOUNTER — Emergency Department (HOSPITAL_COMMUNITY)
Admission: EM | Admit: 2015-06-17 | Discharge: 2015-06-17 | Disposition: A | Discharge status: Home / Self Care | Payer: 59 | Attending: Emergency Medicine | Admitting: Emergency Medicine

## 2015-06-17 DIAGNOSIS — E039 Hypothyroidism, unspecified: Secondary | ICD-10-CM | POA: Insufficient documentation

## 2015-06-17 DIAGNOSIS — J069 Acute upper respiratory infection, unspecified: Secondary | ICD-10-CM | POA: Diagnosis not present

## 2015-06-17 DIAGNOSIS — J309 Allergic rhinitis, unspecified: Secondary | ICD-10-CM

## 2015-06-17 DIAGNOSIS — E785 Hyperlipidemia, unspecified: Secondary | ICD-10-CM | POA: Insufficient documentation

## 2015-06-17 DIAGNOSIS — R05 Cough: Secondary | ICD-10-CM | POA: Diagnosis present

## 2015-06-17 DIAGNOSIS — Z853 Personal history of malignant neoplasm of breast: Secondary | ICD-10-CM | POA: Insufficient documentation

## 2015-06-17 DIAGNOSIS — I10 Essential (primary) hypertension: Secondary | ICD-10-CM | POA: Diagnosis not present

## 2015-06-17 DIAGNOSIS — E119 Type 2 diabetes mellitus without complications: Secondary | ICD-10-CM | POA: Diagnosis not present

## 2015-06-17 DIAGNOSIS — Z79899 Other long term (current) drug therapy: Secondary | ICD-10-CM | POA: Insufficient documentation

## 2015-06-17 DIAGNOSIS — Z7984 Long term (current) use of oral hypoglycemic drugs: Secondary | ICD-10-CM | POA: Diagnosis not present

## 2015-06-17 MED ORDER — FLUTICASONE PROPIONATE 50 MCG/ACT NA SUSP: 2.0000 | Freq: Every day | NASAL | Status: DC

## 2015-06-17 MED ORDER — BENZONATATE 100 MG PO CAPS: 100.0000 mg | ORAL_CAPSULE | Freq: Three times a day (TID) | ORAL | Status: DC | PRN

## 2015-06-17 MED ORDER — CETIRIZINE HCL 10 MG PO TABS: 10.0000 mg | ORAL_TABLET | Freq: Every day | ORAL | Status: DC

## 2015-06-17 NOTE — ED Provider Notes (Signed)
CSN: [id number]     Arrival date & time 06/17/15  0751 History   First MD Initiated Contact with Patient 06/17/15 0804     Chief Complaint  Patient presents with  . URI    [name] is a 55 y.o. female who presents to the ED complaining of cold-like symptoms for the past 7 days. Patient complains of sneezing, runny nose, nasal congestion, postnasal drip and coughing for the past 7 days. She reports trying Robitussin at home with little relief. She denies any fevers, shortness of breath or trouble breathing. She complains of a lot of congestion to her face but no chest congestion. She reports her blood sugars have been good at home and she had a good A1c recently. She reports her last blood sugar was 108 today. She denies fevers, shortness of breath, chest pain, wheezing, sore throat, trouble swallowing, ear pain, ear discharge, abdominal pain, nausea, vomiting, diarrhea, rashes.   Patient is a 55 y.o. female presenting with URI. The history is provided by the patient. No language interpreter was used.  URI Presenting symptoms: congestion, cough and rhinorrhea   Presenting symptoms: no ear pain, no fever and no sore throat   Associated symptoms: sneezing   Associated symptoms: no headaches, no neck pain and no wheezing     Past Medical History  Diagnosis Date  . Hypertension   . Hyperlipidemia   . Thrombocytopenia ([place])   . Diabetes mellitus   . Arthritis   . Breast CA ([place])     breast CA- right breast  . Thyroid disease     parathyroid removed  . Heel spur     bilateral   Past Surgical History  Procedure Laterality Date  . Breast surgery  09/12/10    excision of skin mass - right breast, had another surgery to remove more Cancer cells  . Parathyroidectomy      partial?  . Abdominal hysterectomy      1996 per pt  . Inner ear surgery  06/01/13   Family History  Problem Relation Age of Onset  . CAD Mother 73    CABG  . Stroke Father   . Heart attack Brother 22  . Colon  cancer Neg Hx   . Esophageal cancer Neg Hx   . Rectal cancer Neg Hx   . Stomach cancer Neg Hx    Social History  Substance Use Topics  . Smoking status: Never Smoker   . Smokeless tobacco: Never Used  . Alcohol Use: No   OB History    No data available     Review of Systems  Constitutional: Negative for fever and chills.  HENT: Positive for congestion, postnasal drip, rhinorrhea and sneezing. Negative for ear pain, sore throat and trouble swallowing.   Eyes: Negative for visual disturbance.  Respiratory: Positive for cough. Negative for shortness of breath and wheezing.   Cardiovascular: Negative for chest pain.  Gastrointestinal: Negative for nausea, vomiting, abdominal pain and diarrhea.  Genitourinary: Negative for dysuria.  Musculoskeletal: Negative for back pain, neck pain and neck stiffness.  Skin: Negative for rash.  Neurological: Negative for light-headedness and headaches.      Allergies  Review of patient's allergies indicates no known allergies.  Home Medications   Prior to Admission medications   Medication Sig Start Date End Date Taking? Authorizing Provider  amLODipine (NORVASC) 5 MG tablet Take 1 tablet (5 mg total) by mouth daily. 03/01/15   Olam Idler, MD  benzonatate (TESSALON) 100 MG  capsule Take 1 capsule (100 mg total) by mouth 3 (three) times daily as needed for cough. 06/17/15   Waynetta Pean, PA-C  carvedilol (COREG) 25 MG tablet Take 1 tablet (25 mg total) by mouth 2 (two) times daily with a meal. 03/01/15   Olam Idler, MD  cetirizine (ZYRTEC ALLERGY) 10 MG tablet Take 1 tablet (10 mg total) by mouth daily. 06/17/15   Waynetta Pean, PA-C  fluticasone (FLONASE) 50 MCG/ACT nasal spray Place 2 sprays into both nostrils daily. 06/17/15   Waynetta Pean, PA-C  hydrochlorothiazide (HYDRODIURIL) 25 MG tablet Take 1 tablet (25 mg total) by mouth daily. 04/18/15   Olam Idler, MD  Menthol-Methyl Salicylate (MUSCLE RUB) 10-15 % CREA Apply 1 application  topically as needed for muscle pain.    Historical Provider, MD  metFORMIN (GLUCOPHAGE-XR) 500 MG 24 hr tablet TAKE ONE TABLET BY MOUTH ONCE DAILY WITH BREAKFAST 05/15/15   Olam Idler, MD  oxyCODONE-acetaminophen (PERCOCET/ROXICET) 5-325 MG tablet Take 1 tablet by mouth every 4 (four) hours as needed for severe pain. 04/07/15   Shawn C Joy, PA-C  pravastatin (PRAVACHOL) 40 MG tablet Take 1 tablet (40 mg total) by mouth every evening. 04/17/15   Minus Breeding, MD  spironolactone (ALDACTONE) 25 MG tablet Take 1 tablet (25 mg total) by mouth daily. 05/08/15   Minus Breeding, MD  tamoxifen (NOLVADEX) 20 MG tablet Take 1 tablet (20 mg total) by mouth daily. 12/12/14   Nicholas Lose, MD  traMADol (ULTRAM) 50 MG tablet Take 1 tablet (50 mg total) by mouth 2 (two) times daily as needed. 04/18/15   Olam Idler, MD   BP 162/74 mmHg  Pulse 71  Temp(Src) 98.9 F (37.2 C) (Oral)  Resp 20  Ht 5\' 3"  (1.6 m)  Wt 83.915 kg  BMI 32.78 kg/m2  SpO2 99% Physical Exam  Constitutional: She appears well-developed and well-nourished. No distress.  Nontoxic appearing.  HENT:  Head: Normocephalic and atraumatic.  Right Ear: External ear normal.  Left Ear: External ear normal.  Mouth/Throat: Oropharynx is clear and moist.  Bilateral tympanic membranes are pearly-gray without erythema or loss of landmarks.  Boggy nasal turbinates bilaterally. No tonsillar hypertrophy or exudates.  Eyes: Conjunctivae are normal. Pupils are equal, round, and reactive to light. Right eye exhibits no discharge. Left eye exhibits no discharge.  Neck: Normal range of motion. Neck supple.  Cardiovascular: Normal rate, regular rhythm, normal heart sounds and intact distal pulses.  Exam reveals no gallop and no friction rub.   No murmur heard. Pulmonary/Chest: Effort normal and breath sounds normal. No stridor. No respiratory distress. She has no wheezes. She has no rales.  Lungs are clear to auscultation bilaterally. No increased work of  breathing. No rales or rhonchi.  Abdominal: Soft. There is no tenderness. There is no guarding.  Musculoskeletal: She exhibits no edema or tenderness.  No lower extremity edema or tenderness.  Lymphadenopathy:    She has no cervical adenopathy.  Neurological: She is alert. Coordination normal.  Skin: Skin is warm and dry. No rash noted. She is not diaphoretic. No erythema. No pallor.  Psychiatric: She has a normal mood and affect. Her behavior is normal.  Nursing note and vitals reviewed.   ED Course  Procedures (including critical care time) Labs Review Labs Reviewed - No data to display  Imaging Review No results found.    EKG Interpretation None      Filed Vitals:   06/17/15 0804  BP: 162/74  Pulse:  71  Temp: 98.9 F (37.2 C)  TempSrc: Oral  Resp: 20  Height: 5\' 3"  (1.6 m)  Weight: 83.915 kg  SpO2: 99%     MDM   Meds given in ED:  Medications - No data to display  New Prescriptions   BENZONATATE (TESSALON) 100 MG CAPSULE    Take 1 capsule (100 mg total) by mouth 3 (three) times daily as needed for cough.   CETIRIZINE (ZYRTEC ALLERGY) 10 MG TABLET    Take 1 tablet (10 mg total) by mouth daily.   FLUTICASONE (FLONASE) 50 MCG/ACT NASAL SPRAY    Place 2 sprays into both nostrils daily.    Final diagnoses:  URI (upper respiratory infection)  Allergic rhinitis, unspecified allergic rhinitis type   This is a 55 y.o. female who presents to the ED complaining of cold-like symptoms for the past 7 days. Patient complains of sneezing, runny nose, nasal congestion, postnasal drip and coughing for the past 7 days. She reports trying Robitussin at home with little relief. She denies any fevers, shortness of breath or trouble breathing. She complains of a lot of congestion to her face but no chest congestion.  On exam the patient is afebrile nontoxic appearing. Her lungs clear to auscultation bilaterally. She has boggy nasal turbinates bilaterally. Throat is clear. Patient  has upper respiratory infection. No need for chest x-ray this time is a patient is afebrile and lungs are clear to auscultation. We'll discharge with prescriptions for Tessalon Perles, Zyrtec and Flonase. I encouraged close follow-up by her primary care provider. I discussed strict and specific return precautions. I advised the patient to follow-up with their primary care provider this week. I advised the patient to return to the emergency department with new or worsening symptoms or new concerns. The patient verbalized understanding and agreement with plan.     Waynetta Pean, PA-C 06/17/15 [id number]  Virgel Manifold, MD 06/18/15 [phone]

## 2015-06-17 NOTE — ED Notes (Signed)
Pt reports runny nose with cough x7 days.  Pt reports trying Nyquil at home.

## 2015-06-17 NOTE — Discharge Instructions (Signed)
Upper Respiratory Infection, Adult °Most upper respiratory infections (URIs) are a viral infection of the air passages leading to the lungs. A URI affects the nose, throat, and upper air passages. The most common type of URI is nasopharyngitis and is typically referred to as "the common cold." °URIs run their course and usually go away on their own. Most of the time, a URI does not require medical attention, but sometimes a bacterial infection in the upper airways can follow a viral infection. This is called a secondary infection. Sinus and middle ear infections are common types of secondary upper respiratory infections. °Bacterial pneumonia can also complicate a URI. A URI can worsen asthma and chronic obstructive pulmonary disease (COPD). Sometimes, these complications can require emergency medical care and may be life threatening.  °CAUSES °Almost all URIs are caused by viruses. A virus is a type of germ and can spread from one person to another.  °RISKS FACTORS °You may be at risk for a URI if:  °· You smoke.   °· You have chronic heart or lung disease. °· You have a weakened defense (immune) system.   °· You are very young or very old.   °· You have nasal allergies or asthma. °· You work in crowded or poorly ventilated areas. °· You work in health care facilities or schools. °SIGNS AND SYMPTOMS  °Symptoms typically develop 2-3 days after you come in contact with a cold virus. Most viral URIs last 7-10 days. However, viral URIs from the influenza virus (flu virus) can last 14-18 days and are typically more severe. Symptoms may include:  °· Runny or stuffy (congested) nose.   °· Sneezing.   °· Cough.   °· Sore throat.   °· Headache.   °· Fatigue.   °· Fever.   °· Loss of appetite.   °· Pain in your forehead, behind your eyes, and over your cheekbones (sinus pain). °· Muscle aches.   °DIAGNOSIS  °Your health care provider may diagnose a URI by: °· Physical exam. °· Tests to check that your symptoms are not due to  another condition such as: °· Strep throat. °· Sinusitis. °· Pneumonia. °· Asthma. °TREATMENT  °A URI goes away on its own with time. It cannot be cured with medicines, but medicines may be prescribed or recommended to relieve symptoms. Medicines may help: °· Reduce your fever. °· Reduce your cough. °· Relieve nasal congestion. °HOME CARE INSTRUCTIONS  °· Take medicines only as directed by your health care provider.   °· Gargle warm saltwater or take cough drops to comfort your throat as directed by your health care provider. °· Use a warm mist humidifier or inhale steam from a shower to increase air moisture. This may make it easier to breathe. °· Drink enough fluid to keep your urine clear or pale yellow.   °· Eat soups and other clear broths and maintain good nutrition.   °· Rest as needed.   °· Return to work when your temperature has returned to normal or as your health care provider advises. You may need to stay home longer to avoid infecting others. You can also use a face mask and careful hand washing to prevent spread of the virus. °· Increase the usage of your inhaler if you have asthma.   °· Do not use any tobacco products, including cigarettes, chewing tobacco, or electronic cigarettes. If you need help quitting, ask your health care provider. °PREVENTION  °The best way to protect yourself from getting a cold is to practice good hygiene.  °· Avoid oral or hand contact with people with cold   symptoms.   °· Wash your hands often if contact occurs.   °There is no clear evidence that vitamin C, vitamin E, echinacea, or exercise reduces the chance of developing a cold. However, it is always recommended to get plenty of rest, exercise, and practice good nutrition.  °SEEK MEDICAL CARE IF:  °· You are getting worse rather than better.   °· Your symptoms are not controlled by medicine.   °· You have chills. °· You have worsening shortness of breath. °· You have brown or red mucus. °· You have yellow or brown nasal  discharge. °· You have pain in your face, especially when you bend forward. °· You have a fever. °· You have swollen neck glands. °· You have pain while swallowing. °· You have white areas in the back of your throat. °SEEK IMMEDIATE MEDICAL CARE IF:  °· You have severe or persistent: °¨ Headache. °¨ Ear pain. °¨ Sinus pain. °¨ Chest pain. °· You have chronic lung disease and any of the following: °¨ Wheezing. °¨ Prolonged cough. °¨ Coughing up blood. °¨ A change in your usual mucus. °· You have a stiff neck. °· You have changes in your: °¨ Vision. °¨ Hearing. °¨ Thinking. °¨ Mood. °MAKE SURE YOU:  °· Understand these instructions. °· Will watch your condition. °· Will get help right away if you are not doing well or get worse. °  °This information is not intended to replace advice given to you by your health care provider. Make sure you discuss any questions you have with your health care provider. °  °Document Released: 07/17/2000 Document Revised: 06/07/2014 Document Reviewed: 04/28/2013 °Elsevier Interactive Patient Education ©2016 [employer] °Allergic Rhinitis °Allergic rhinitis is when the mucous membranes in the nose respond to allergens. Allergens are particles in the air that cause your body to have an allergic reaction. This causes you to release allergic antibodies. Through a chain of events, these eventually cause you to release histamine into the blood stream. Although meant to protect the body, it is this release of histamine that causes your discomfort, such as frequent sneezing, congestion, and an itchy, runny nose.  °CAUSES °Seasonal allergic rhinitis (hay fever) is caused by pollen allergens that may come from grasses, trees, and weeds. Year-round allergic rhinitis (perennial allergic rhinitis) is caused by allergens such as house dust mites, pet dander, and mold spores. °SYMPTOMS °· Nasal stuffiness (congestion). °· Itchy, runny nose with sneezing and tearing of the eyes. °DIAGNOSIS °Your health  care provider can help you determine the allergen or allergens that trigger your symptoms. If you and your health care provider are unable to determine the allergen, skin or blood testing may be used. Your health care provider will diagnose your condition after taking your health history and performing a physical exam. Your health care provider may assess you for other related conditions, such as asthma, pink eye, or an ear infection. °TREATMENT °Allergic rhinitis does not have a cure, but it can be controlled by: °· Medicines that block allergy symptoms. These may include allergy shots, nasal sprays, and oral antihistamines. °· Avoiding the allergen. °Hay fever may often be treated with antihistamines in pill or nasal spray forms. Antihistamines block the effects of histamine. There are over-the-counter medicines that may help with nasal congestion and swelling around the eyes. Check with your health care provider before taking or giving this medicine. °If avoiding the allergen or the medicine prescribed do not work, there are many new medicines your health care provider can prescribe. Stronger medicine   may be used if initial measures are ineffective. Desensitizing injections can be used if medicine and avoidance does not work. Desensitization is when a patient is given ongoing shots until the body becomes less sensitive to the allergen. Make sure you follow up with your health care provider if problems continue. °HOME CARE INSTRUCTIONS °It is not possible to completely avoid allergens, but you can reduce your symptoms by taking steps to limit your exposure to them. It helps to know exactly what you are allergic to so that you can avoid your specific triggers. °SEEK MEDICAL CARE IF: °· You have a fever. °· You develop a cough that does not stop easily (persistent). °· You have shortness of breath. °· You start wheezing. °· Symptoms interfere with normal daily activities. °  °This information is not intended to  replace advice given to you by your health care provider. Make sure you discuss any questions you have with your health care provider. °  °Document Released: 10/16/2000 Document Revised: 02/11/2014 Document Reviewed: 09/28/2012 °Elsevier Interactive Patient Education ©2016 Elsevier Inc. ° °

## 2015-06-20 DIAGNOSIS — I10 Essential (primary) hypertension: Secondary | ICD-10-CM | POA: Diagnosis not present

## 2015-06-20 DIAGNOSIS — Z6834 Body mass index (BMI) 34.0-34.9, adult: Secondary | ICD-10-CM | POA: Diagnosis not present

## 2015-06-20 DIAGNOSIS — E119 Type 2 diabetes mellitus without complications: Secondary | ICD-10-CM | POA: Diagnosis not present

## 2015-06-20 DIAGNOSIS — C50811 Malignant neoplasm of overlapping sites of right female breast: Secondary | ICD-10-CM | POA: Diagnosis not present

## 2015-06-27 ENCOUNTER — Ambulatory Visit (INDEPENDENT_AMBULATORY_CARE_PROVIDER_SITE_OTHER): Payer: 59 | Admitting: Family Medicine

## 2015-06-27 ENCOUNTER — Encounter: Payer: Self-pay | Admitting: Family Medicine

## 2015-06-27 VITALS — BP 138/84 | HR 75 | Temp 98.8°F | Ht 63.0 in | Wt 205.0 lb

## 2015-06-27 DIAGNOSIS — Z114 Encounter for screening for human immunodeficiency virus [HIV]: Secondary | ICD-10-CM

## 2015-06-27 DIAGNOSIS — R740 Nonspecific elevation of levels of transaminase and lactic acid dehydrogenase [LDH]: Secondary | ICD-10-CM

## 2015-06-27 DIAGNOSIS — I10 Essential (primary) hypertension: Secondary | ICD-10-CM | POA: Diagnosis not present

## 2015-06-27 DIAGNOSIS — E11319 Type 2 diabetes mellitus with unspecified diabetic retinopathy without macular edema: Secondary | ICD-10-CM

## 2015-06-27 DIAGNOSIS — R7401 Elevation of levels of liver transaminase levels: Secondary | ICD-10-CM

## 2015-06-27 DIAGNOSIS — D693 Immune thrombocytopenic purpura: Secondary | ICD-10-CM | POA: Diagnosis not present

## 2015-06-27 DIAGNOSIS — Z111 Encounter for screening for respiratory tuberculosis: Secondary | ICD-10-CM | POA: Diagnosis not present

## 2015-06-27 DIAGNOSIS — E785 Hyperlipidemia, unspecified: Secondary | ICD-10-CM | POA: Diagnosis not present

## 2015-06-27 LAB — COMPLETE METABOLIC PANEL WITH GFR
ALBUMIN: 3.8 g/dL (ref 3.6–5.1)
ALK PHOS: 43 U/L (ref 33–130)
ALT: 38 U/L — ABNORMAL HIGH (ref 6–29)
AST: 43 U/L — ABNORMAL HIGH (ref 10–35)
BILIRUBIN TOTAL: 0.2 mg/dL (ref 0.2–1.2)
BUN: 11 mg/dL (ref 7–25)
CALCIUM: 9.8 mg/dL (ref 8.6–10.4)
CO2: 25 mmol/L (ref 20–31)
Chloride: 104 mmol/L (ref 98–110)
Creat: 0.83 mg/dL (ref 0.50–1.05)
GFR, EST NON AFRICAN AMERICAN: 80 mL/min (ref >=60)
GLUCOSE: 114 mg/dL — AB (ref 65–99)
POTASSIUM: 4.2 mmol/L (ref 3.5–5.3)
SODIUM: 140 mmol/L (ref 135–146)
TOTAL PROTEIN: 6.8 g/dL (ref 6.1–8.1)

## 2015-06-27 MED ORDER — ONETOUCH ULTRA 2 W/DEVICE KIT: 1.0000 | PACK | Freq: Once | Status: DC

## 2015-06-27 MED ORDER — ONETOUCH ULTRASOFT LANCETS MISC: Status: DC

## 2015-06-27 MED ORDER — GLUCOSE BLOOD VI STRP: ORAL_STRIP | Status: DC

## 2015-06-27 NOTE — Patient Instructions (Signed)
Your blood pressure was good today. Continue to check at home and call if you consistently see numbers greater than 140/90.  - Follow-up in 3 months for diabetes and high blood pressure.  - I have prescribed you a diabetes meter.

## 2015-06-27 NOTE — Assessment & Plan Note (Signed)
BP under good control today. Goal < 140/90 - Continue Coreg 25mg  BID; Norvasc 5mg  qd, Spironolactone 25mg  qd - Consider discontinuing CCB at next visit if LE swelling persist or worsens.  - Consider restarting HCTZ though had hypokalemia in the past possibly due to this? Might not be a problem now that she is on spironolactone. Could switch CCB to ACE-I but would have to monitor for hyperKalemia

## 2015-06-27 NOTE — Assessment & Plan Note (Signed)
Given Rx for meter today. Unable to obtain A1c.  - f/u in 2-4 weeks for A1c  - consider inc Metformin XR to 1000 mg  - check urine micro albumin

## 2015-06-27 NOTE — Assessment & Plan Note (Signed)
>>  ASSESSMENT AND PLAN FOR DM (DIABETES MELLITUS) WITH COMPLICATIONS (HCC) WRITTEN ON 06/27/2015  3:15 PM BY ALTO LYNWOOD SAUNDERS, MD  Given Rx for meter today. Unable to obtain A1c.  - f/u in 2-4 weeks for A1c  - consider inc Metformin  XR to 1000 mg  - check urine micro albumin

## 2015-06-27 NOTE — Progress Notes (Signed)
  Patient name: [name] MRN [id number]  Date of birth: [birth year]  CC & HPI:  [name] is a 55 y.o. female presenting today for HTN. Denies CP, SOB or palpitations today. Compliant with spironolactone start by cardiologist. Report some LE swelling. Reports stress test was normal and she was told to f/u with me for HTN.   Smoking History Noted  Objective Findings:  Vitals: BP 138/84 mmHg  Pulse 75  Temp(Src) 98.8 F (37.1 C) (Oral)  Ht 5\' 3"  (1.6 m)  Wt 205 lb (92.987 kg)  BMI 36.32 kg/m2  Gen: NAD CV: RRR w/o m/r/g, pulses +2 b/l Resp: CTAB w/ normal respiratory effort Lower Ext: trace edema bilaterally   Assessment & Plan:   Essential hypertension BP under good control today. Goal < 140/90 - Continue Coreg 25mg  BID; Norvasc 5mg  qd, Spironolactone 25mg  qd - Consider discontinuing CCB at next visit if LE swelling persist or worsens.  - Consider restarting HCTZ though had hypokalemia in the past possibly due to this? Might not be a problem now that she is on spironolactone. Could switch CCB to ACE-I but would have to monitor for hyperKalemia   Diabetes ([place]) Given Rx for meter today. Unable to obtain A1c.  - f/u in 2-4 weeks for A1c  - consider inc Metformin XR to 1000 mg  - check urine micro albumin

## 2015-06-28 LAB — MICROALBUMIN, URINE: Microalb, Ur: 0.4 mg/dL

## 2015-06-28 LAB — HIV ANTIBODY (ROUTINE TESTING W REFLEX): HIV: NONREACTIVE

## 2015-06-29 ENCOUNTER — Ambulatory Visit (INDEPENDENT_AMBULATORY_CARE_PROVIDER_SITE_OTHER): Payer: 59 | Admitting: *Deleted

## 2015-06-29 DIAGNOSIS — Z111 Encounter for screening for respiratory tuberculosis: Secondary | ICD-10-CM

## 2015-06-29 DIAGNOSIS — Z7689 Persons encountering health services in other specified circumstances: Secondary | ICD-10-CM

## 2015-06-29 LAB — TB SKIN TEST
INDURATION: 0 mm
TB Skin Test: NEGATIVE

## 2015-06-29 NOTE — Progress Notes (Signed)
     PPD Reading Note PPD read and results entered in Epic Result: 0 mm induration. Interpretation: Negative Allergic reaction: no erythema; quarter size bruising noted at site. Patient given letter for work Kollyns Mickelson, Orvis Brill, RN

## 2015-07-05 ENCOUNTER — Telehealth: Payer: Self-pay | Admitting: *Deleted

## 2015-07-05 NOTE — Telephone Encounter (Signed)
Will forward to MD. Adlean Hardeman,CMA  

## 2015-07-05 NOTE — Telephone Encounter (Signed)
Pt would like lab results from 06/27/15. Wilsie Kern, Salome Spotted, CMA

## 2015-07-05 NOTE — Telephone Encounter (Signed)
Pt returning dr call. [phone]. Collen Hostler Kennon Holter, CMA

## 2015-07-06 NOTE — Telephone Encounter (Signed)
Will forward to MD to try and reach patient again today. Jazmin Hartsell,CMA

## 2015-07-06 NOTE — Telephone Encounter (Signed)
Pt is calling to get her lab results.

## 2015-07-07 NOTE — Telephone Encounter (Signed)
Patient is calling to receive her lab results.  She can be reached on her cell at [phone] or at work [phone]. Jazmin Hartsell,CMA

## 2015-07-11 NOTE — Telephone Encounter (Signed)
Called and discussed results of recent blood work. AST and ALTs remain mildly elevated.  - She will increase metformin XRto 1000 mg once a day - She will follow-up in the next month to recheck hemoglobin A1c - Continue to monitor for signs of worsenng liver dysfunction and consider additional work-up as needed.

## 2015-07-18 ENCOUNTER — Other Ambulatory Visit: Payer: Self-pay | Admitting: Family Medicine

## 2015-07-18 DIAGNOSIS — E11319 Type 2 diabetes mellitus with unspecified diabetic retinopathy without macular edema: Secondary | ICD-10-CM

## 2015-07-18 MED ORDER — ONETOUCH ULTRASOFT LANCETS MISC: Status: DC

## 2015-07-19 ENCOUNTER — Telehealth: Payer: Self-pay | Admitting: *Deleted

## 2015-07-19 DIAGNOSIS — E11319 Type 2 diabetes mellitus with unspecified diabetic retinopathy without macular edema: Secondary | ICD-10-CM

## 2015-07-19 MED ORDER — ONETOUCH ULTRASOFT LANCETS MISC: Status: DC

## 2015-07-19 NOTE — Telephone Encounter (Signed)
Received fax from [facility] needing diagnosis code for lancets.  Rx resent to [facility] with ICD-10 code E11.9.  Derl Barrow, RN

## 2015-07-20 ENCOUNTER — Telehealth: Payer: Self-pay | Admitting: Family Medicine

## 2015-07-20 ENCOUNTER — Other Ambulatory Visit: Payer: Self-pay | Admitting: Family Medicine

## 2015-07-20 NOTE — Telephone Encounter (Signed)
Patient state she is having a lot of swelling in her extremities and was told to call Dr. Berkley [facility]. She is requesting to speak to Dr. Berkley [facility] about this.

## 2015-07-20 NOTE — Telephone Encounter (Signed)
Please have her come in, so I can evaluate this and we can discuss treatment options.

## 2015-07-20 NOTE — Telephone Encounter (Signed)
Will forward to MD to advise. [name],CMA  

## 2015-07-21 NOTE — Telephone Encounter (Signed)
I left pt a message for her to call me back to schedule and apt with Dr. Berkley [facility]. Page, cma.

## 2015-07-31 ENCOUNTER — Ambulatory Visit (INDEPENDENT_AMBULATORY_CARE_PROVIDER_SITE_OTHER): Payer: 59 | Admitting: Student

## 2015-07-31 ENCOUNTER — Encounter: Payer: Self-pay | Admitting: Student

## 2015-07-31 VITALS — BP 140/67 | HR 72 | Temp 97.9°F | Ht 63.0 in | Wt 208.0 lb

## 2015-07-31 DIAGNOSIS — R600 Localized edema: Secondary | ICD-10-CM

## 2015-07-31 DIAGNOSIS — E11319 Type 2 diabetes mellitus with unspecified diabetic retinopathy without macular edema: Secondary | ICD-10-CM

## 2015-07-31 DIAGNOSIS — I10 Essential (primary) hypertension: Secondary | ICD-10-CM

## 2015-07-31 LAB — GLUCOSE, CAPILLARY: Glucose-Capillary: 129 mg/dL — ABNORMAL HIGH (ref 65–99)

## 2015-07-31 LAB — BASIC METABOLIC PANEL
BUN: 9 mg/dL (ref 7–25)
CALCIUM: 9.1 mg/dL (ref 8.6–10.4)
CO2: 25 mmol/L (ref 20–31)
CREATININE: 0.66 mg/dL (ref 0.50–1.05)
Chloride: 105 mmol/L (ref 98–110)
GLUCOSE: 120 mg/dL — AB (ref 65–99)
Potassium: 4.1 mmol/L (ref 3.5–5.3)
Sodium: 139 mmol/L (ref 135–146)

## 2015-07-31 LAB — POCT GLYCOSYLATED HEMOGLOBIN (HGB A1C): Hemoglobin A1C: 6.2

## 2015-07-31 MED ORDER — LISINOPRIL 20 MG PO TABS: 20.0000 mg | ORAL_TABLET | Freq: Every day | ORAL | Status: DC

## 2015-07-31 NOTE — Patient Instructions (Signed)
Follow up in 1 week for BP check Please STOP the Norvasc and start Lisinopril Please obtain sleep study Please eat healthfully and exercise daily at least 30 mins Your A1C is 6.2 today down from 6.5 in 02/2015. Great Job! Obtain twice as many veg's as protein or carbohydrate foods for both lunch and dinner. If you have any questions or concerns, call the office at [phone]

## 2015-08-01 NOTE — Assessment & Plan Note (Signed)
Stable BP today of 140/67. Will stop norvasc in the setting of LE swelling, start lisinopril. Given reported OSA type symptoms with body habitus consistent with this syndrome, will obtain sleep study - f/u in one week for BP check

## 2015-08-01 NOTE — Progress Notes (Signed)
   Subjective:    Patient ID: [name], female    DOB: 10-Sep-1960, 55 y.o.   MRN: [id number]   CC: feet swelling  HPI: 55 y/o F with PMH og HTN presenting for bilateral feet swelling  Feet swelling - pateint reports this has been ongoing for the past several years treated with HCTZ - however she became hypokalemic and her HCTZ was stopped and she was started on spirololactone - She is treated with Norvasc for HTN and she feels her feet swelling started when this was started - she denies foot pain, lesions or rashes  HTN - managed with spironolactone, Norvasc - reports snoring, awakening with gasping for air with some day time sleepiness   Smoking status reviewed   Review of Systems Per HPI else denies chest pain, SOB, recent illness      Objective:  BP 140/67 mmHg  Pulse 72  Temp(Src) 97.9 F (36.6 C) (Oral)  Ht 5\' 3"  (1.6 m)  Wt 208 lb (94.348 kg)  BMI 36.85 kg/m2 Vitals and nursing note reviewed  General: NAD Cardiac: RRR,  Respiratory: CTAB, normal effort Extremities: bilateral feet swelling 1 + pitting edema , + DP pulses bilaterally, sensation intact bilaterally Skin: warm and dry, no rashes noted Neuro: alert and oriented, no focal deficits   Assessment & Plan:    Bilateral lower extremity edema Chronic LE swelling in setting of norvas use - Will d/c norvasc and start lisinopril - BP check in 1 week  Essential hypertension Stable BP today of 140/67. Will stop norvasc in the setting of LE swelling, start lisinopril. Given reported OSA type symptoms with body habitus consistent with this syndrome, will obtain sleep study - f/u in one week for BP check  Diabetes ([place]) A1c 6.2 from 6.5 - will continue metformin 500 ER, consider increasing - continue diet and exercise counseling, discussed today - BMP today     Keirsten Matuska A. Lincoln Brigham MD, [facility] Resident PGY-2 Pager [phone]

## 2015-08-01 NOTE — Assessment & Plan Note (Signed)
Chronic LE swelling in setting of norvas use - Will d/c norvasc and start lisinopril - BP check in 1 week

## 2015-08-01 NOTE — Assessment & Plan Note (Signed)
A1c 6.2 from 6.5 - will continue metformin 500 ER, consider increasing - continue diet and exercise counseling, discussed today - BMP today

## 2015-08-01 NOTE — Assessment & Plan Note (Signed)
>>  ASSESSMENT AND PLAN FOR DM (DIABETES MELLITUS) WITH COMPLICATIONS (HCC) WRITTEN ON 08/01/2015  7:53 PM BY HANEY, ALYSSA A, MD  A1c 6.2 from 6.5 - will continue metformin  500 ER, consider increasing - continue diet and exercise counseling, discussed today - BMP today

## 2015-08-09 ENCOUNTER — Telehealth: Payer: Self-pay | Admitting: Student

## 2015-08-09 NOTE — Telephone Encounter (Signed)
Will forward to MD for results.  Shayla Heming,CMA  

## 2015-08-09 NOTE — Telephone Encounter (Signed)
Pt called and would like to know what her lab results were. jw °

## 2015-08-11 ENCOUNTER — Encounter: Payer: Self-pay | Admitting: Student

## 2015-08-11 ENCOUNTER — Telehealth: Payer: Self-pay | Admitting: Student

## 2015-08-11 NOTE — Telephone Encounter (Signed)
Patient called regarding her test results in clinic. She did not answer. She did not have a voice mail system set up. Will call again

## 2015-08-14 ENCOUNTER — Telehealth: Payer: Self-pay | Admitting: Student

## 2015-08-14 DIAGNOSIS — I10 Essential (primary) hypertension: Secondary | ICD-10-CM

## 2015-08-14 NOTE — Telephone Encounter (Signed)
Called patient to ask her to schedule an appt for BP check and BMP. Her son, Dominica Severin, answered and stated he would help her to make the appointment

## 2015-08-15 ENCOUNTER — Other Ambulatory Visit: Payer: 59

## 2015-08-15 DIAGNOSIS — I10 Essential (primary) hypertension: Secondary | ICD-10-CM

## 2015-08-16 LAB — BASIC METABOLIC PANEL WITH GFR
BUN: 12 mg/dL (ref 7–25)
CALCIUM: 9.1 mg/dL (ref 8.6–10.4)
CO2: 26 mmol/L (ref 20–31)
CREATININE: 0.96 mg/dL (ref 0.50–1.05)
Chloride: 105 mmol/L (ref 98–110)
GFR, EST AFRICAN AMERICAN: 77 mL/min (ref >=60)
GFR, EST NON AFRICAN AMERICAN: 67 mL/min (ref >=60)
GLUCOSE: 88 mg/dL (ref 65–99)
POTASSIUM: 4.2 mmol/L (ref 3.5–5.3)
Sodium: 140 mmol/L (ref 135–146)

## 2015-08-21 ENCOUNTER — Ambulatory Visit (INDEPENDENT_AMBULATORY_CARE_PROVIDER_SITE_OTHER): Payer: 59 | Admitting: Student

## 2015-08-21 ENCOUNTER — Encounter: Payer: Self-pay | Admitting: Student

## 2015-08-21 VITALS — BP 148/88 | HR 72 | Temp 98.6°F | Wt 208.0 lb

## 2015-08-21 DIAGNOSIS — I10 Essential (primary) hypertension: Secondary | ICD-10-CM

## 2015-08-21 MED ORDER — LISINOPRIL 40 MG PO TABS: 40.0000 mg | ORAL_TABLET | Freq: Every day | ORAL | Status: DC

## 2015-08-21 NOTE — Patient Instructions (Signed)
Follow-up in one week for BMP and blood pressure check Please follow with Dr. Jenne Campus for nutrition as soon as possible Please take your blood pressure at home, record it, improving at your next appointment Obtain twice as many veg's as protein or carbohydrate foods for both lunch and dinner. If you have questions or concerns please call the office at [phone]

## 2015-08-21 NOTE — Assessment & Plan Note (Addendum)
Initial Blood pressure 200/100 but improved to 148/88 which is still above goal - will increase lisinopril to 40 from 20 - BMP today - return in 1 week for BP check, BMP - consider addition of HCTZ if still uncontrolled

## 2015-08-21 NOTE — Progress Notes (Signed)
   Subjective:    Patient ID: [name], female    DOB: 04-Jun-1960, 55 y.o.   MRN: [id number]   [id number] pressure follow-up and BMP   HPI: 55 year old female with a history of hypertension presenting for blood pressure follow-up and BMP after lisinopril initiation  Blood pressure - Reports compliance with blood pressure regimen - Denies headache, trends of breath, chest pain, changes in vision - Does not take her blood pressure at home - Reports improved long-term swelling after stopping Norvasc    Smoking status reviewed  Review of Systems  Per HPI, else denies recent illness, fever, headache, changes in vision, chest pain, shortness of breath, abdominal pain, N/V/D, weakness    Objective:  BP 148/88 mmHg  Pulse 72  Temp(Src) 98.6 F (37 C) (Oral)  Wt 208 lb (94.348 kg)  SpO2 99% Vitals and nursing note reviewed  General: NAD Cardiac: RRR, normal heart sounds, Respiratory: CTAB, normal effort Extremities: no edema or cyanosis. WWP. Skin: warm and dry,  Neuro: alert and oriented,    Assessment & Plan:    Essential hypertension Initial Blood pressure 200/100 but improved to 148/88 which is still above goal - will increase lisinopril to 40 from 20 - BMP today - return in 1 week for BP check, BMP - consider addition of HCTZ if still uncontrolled     Labella Zahradnik A. Lincoln Brigham MD, [facility] Resident PGY-3 Pager [phone]

## 2015-08-22 LAB — BASIC METABOLIC PANEL
BUN: 11 mg/dL (ref 7–25)
CO2: 28 mmol/L (ref 20–31)
CREATININE: 0.86 mg/dL (ref 0.50–1.05)
Calcium: 9.4 mg/dL (ref 8.6–10.4)
Chloride: 106 mmol/L (ref 98–110)
Glucose, Bld: 95 mg/dL (ref 65–99)
POTASSIUM: 4.4 mmol/L (ref 3.5–5.3)
Sodium: 141 mmol/L (ref 135–146)

## 2015-08-30 ENCOUNTER — Ambulatory Visit (INDEPENDENT_AMBULATORY_CARE_PROVIDER_SITE_OTHER): Payer: 59 | Admitting: Student

## 2015-08-30 ENCOUNTER — Encounter: Payer: Self-pay | Admitting: Student

## 2015-08-30 VITALS — BP 153/73 | HR 71 | Temp 98.2°F | Wt 208.0 lb

## 2015-08-30 DIAGNOSIS — E669 Obesity, unspecified: Secondary | ICD-10-CM

## 2015-08-30 DIAGNOSIS — I10 Essential (primary) hypertension: Secondary | ICD-10-CM | POA: Diagnosis not present

## 2015-08-30 NOTE — Assessment & Plan Note (Signed)
Body mass index is 36.85 kg/m.  - Discussed healthy diet and exercise. - Given contact information for Dr. Jenne Campus as requested

## 2015-08-30 NOTE — Patient Instructions (Signed)
Follow-up in one month for blood pressure Please check your blood pressures every day and record them If questions or concerns call the office at [phone] You will be given the number for the nutritionist these make an appointment as soon as possible Obtain twice as many veg's as protein or carbohydrate foods for both lunch and dinner.

## 2015-08-30 NOTE — Assessment & Plan Note (Signed)
Blood pressure improved with lisinopril. Creatinine at last check was within normal limits.  -Continue lisinopril, check BMP today - Consider addition of hydrochlorothiazide at next visit if blood pressure continues to be elevated. She did have hypokalemia with hydrochlorothiazide and past the must take that into consideration

## 2015-08-30 NOTE — Progress Notes (Signed)
   Subjective:    Patient ID: [name], female    DOB: May 28, 1960, 55 y.o.   MRN: [id number]   CC: Hypertension   HPI: 55 y/o F with PMH of Hypertension presents for HTN follow and follow up of Cr after increasing her lisinopril  Hypertension - reports compliance with lisinopril, denies dizziness, headache, chest pain, SOB - she checks her blood pressure at home and it has been running 122-148/67-81  Obesity - has been trying to [place] healthfully and would like to speak to our dietician.   Smoking status reviewed  Review of Systems  Per HPI, else denies recent illness, fever, headache, changes in vision, chest pain, shortness of breath, abdominal pain, N/V/D, weakness    Objective:  BP (!) 153/73   Pulse 71   Temp 98.2 F (36.8 C) (Oral)   Wt 208 lb (94.3 kg)   SpO2 100%   BMI 36.85 kg/m  Vitals and nursing note reviewed  General: NAD Cardiac: RRR Respiratory: CTAB, normal effort Extremities: no edema or cyanosis. Skin: warm and dry, no rashes noted Neuro: alert and oriented    Assessment & Plan:    Essential hypertension Blood pressure improved with lisinopril. Creatinine at last check was within normal limits.  -Continue lisinopril, check BMP today - Consider addition of hydrochlorothiazide at next visit if blood pressure continues to be elevated. She did have hypokalemia with hydrochlorothiazide and past the must take that into consideration  Obesity Body mass index is 36.85 kg/m.  - Discussed healthy diet and exercise. - Given contact information for Dr. Jenne Campus as requested    Judit Awad A. Lincoln Brigham MD, [facility] Resident PGY-3 Pager [phone]

## 2015-08-31 LAB — BASIC METABOLIC PANEL
BUN: 11 mg/dL (ref 7–25)
CALCIUM: 9.2 mg/dL (ref 8.6–10.4)
CHLORIDE: 106 mmol/L (ref 98–110)
CO2: 26 mmol/L (ref 20–31)
CREATININE: 0.88 mg/dL (ref 0.50–1.05)
Glucose, Bld: 88 mg/dL (ref 65–99)
POTASSIUM: 4 mmol/L (ref 3.5–5.3)
SODIUM: 140 mmol/L (ref 135–146)

## 2015-09-03 ENCOUNTER — Other Ambulatory Visit: Payer: Self-pay | Admitting: Family Medicine

## 2015-09-03 DIAGNOSIS — I10 Essential (primary) hypertension: Secondary | ICD-10-CM

## 2015-09-11 ENCOUNTER — Telehealth: Payer: Self-pay | Admitting: *Deleted

## 2015-09-11 NOTE — Telephone Encounter (Signed)
New script for Accu-check aviva needed since this is covered by patient's insurance.  Please send this to the pharmacy.  It will need to be signed by a [place] certified. Cyndi Montejano,CMA

## 2015-09-19 ENCOUNTER — Ambulatory Visit (HOSPITAL_BASED_OUTPATIENT_CLINIC_OR_DEPARTMENT_OTHER): Payer: 59 | Attending: Family Medicine | Admitting: Internal Medicine

## 2015-09-19 DIAGNOSIS — I493 Ventricular premature depolarization: Secondary | ICD-10-CM | POA: Diagnosis not present

## 2015-09-19 DIAGNOSIS — Z79899 Other long term (current) drug therapy: Secondary | ICD-10-CM | POA: Diagnosis not present

## 2015-09-19 DIAGNOSIS — E11311 Type 2 diabetes mellitus with unspecified diabetic retinopathy with macular edema: Secondary | ICD-10-CM | POA: Insufficient documentation

## 2015-09-19 DIAGNOSIS — E669 Obesity, unspecified: Secondary | ICD-10-CM | POA: Insufficient documentation

## 2015-09-19 DIAGNOSIS — R0683 Snoring: Secondary | ICD-10-CM | POA: Diagnosis not present

## 2015-09-19 DIAGNOSIS — R5383 Other fatigue: Secondary | ICD-10-CM | POA: Diagnosis not present

## 2015-09-19 DIAGNOSIS — Z6837 Body mass index (BMI) 37.0-37.9, adult: Secondary | ICD-10-CM | POA: Diagnosis not present

## 2015-09-19 DIAGNOSIS — E11319 Type 2 diabetes mellitus with unspecified diabetic retinopathy without macular edema: Secondary | ICD-10-CM | POA: Diagnosis not present

## 2015-09-19 DIAGNOSIS — I10 Essential (primary) hypertension: Secondary | ICD-10-CM | POA: Insufficient documentation

## 2015-09-19 DIAGNOSIS — G4700 Insomnia, unspecified: Secondary | ICD-10-CM | POA: Diagnosis not present

## 2015-09-23 NOTE — Procedures (Signed)
   Patient Name: [name], [name] Date: 09/19/2015 Gender: Female D.O.B: 07/29/1960 Age (years): 42 Referring Provider: Blane Ohara McDiarmid Height (inches): 63 Interpreting Physician: Baird Lyons MD, ABSM Weight (lbs): 208 RPSGT: Carolin Coy BMI: 37 MRN: [id number] Neck Size: 14.50 CLINICAL INFORMATION Sleep Study Type: NPSG Indication for sleep study: Diabetes, Fatigue, Hypertension, Obesity, Snoring Epworth Sleepiness Score: 0  SLEEP STUDY TECHNIQUE As per the AASM Manual for the Scoring of Sleep and Associated Events v2.3 (April 2016) with a hypopnea requiring 4% desaturations. The channels recorded and monitored were frontal, central and occipital EEG, electrooculogram (EOG), submentalis EMG (chin), nasal and oral airflow, thoracic and abdominal wall motion, anterior tibialis EMG, snore microphone, electrocardiogram, and pulse oximetry.  MEDICATIONS Patient's medications include: charted for review. Medications self-administered by patient during sleep study :COREG, PRAVASTATIN  SLEEP ARCHITECTURE The study was initiated at 10:44:13 PM and ended at 4:39:08 AM. Sleep onset time was 5.4 minutes and the sleep efficiency was 61.4%. The total sleep time was 218.0 minutes. Stage REM latency was 106.5 minutes. The patient spent 10.09% of the night in stage N1 sleep, 76.15% in stage N2 sleep, 0.00% in stage N3 and 13.76% in REM. Alpha intrusion was absent. Supine sleep was 92.89%. Wake after sleep onset 131 miutes  RESPIRATORY PARAMETERS The overall apnea/hypopnea index (AHI) was 0.8 per hour. There were 0 total apneas, including 0 obstructive, 0 central and 0 mixed apneas. There were 3 hypopneas and 7 RERAs. The AHI during Stage REM sleep was 6.0 per hour. AHI while supine was 0.9 per hour. The mean oxygen saturation was 96.71%. The minimum SpO2 during sleep was 91.00%. Soft snoring was noted during this study.  CARDIAC DATA The 2 lead EKG demonstrated sinus rhythm.  The mean heart rate was 71.17 beats per minute. Other EKG findings include: PVCs.  LEG MOVEMENT DATA The total PLMS were 0 with a resulting PLMS index of 0.00. Associated arousal with leg movement index was 0.0 .  IMPRESSIONS - No significant obstructive sleep apnea occurred during this study (AHI = 0.8/h). - No significant central sleep apnea occurred during this study (CAI = 0.0/h). - The patient had minimal or no oxygen desaturation during the study (Min O2 = 91.00%) - The patient snored with Soft snoring volume. - Difficulty maintaining sleep- awake after 3:00 AM - EKG findings include PVCs. - Clinically significant periodic limb movements did not occur during sleep. No significant associated arousals.  DIAGNOSIS - Insomnia  RECOMMENDATIONS - Avoid alcohol, sedatives and other CNS depressants that may worsen sleep apnea and disrupt normal sleep architecture. - Sleep hygiene should be reviewed to assess factors that may improve sleep quality. - Weight management and regular exercise should be initiated or continued if appropriate.  [Electronically signed] 09/23/2015 11:32 AM  Baird Lyons MD, ABSM Diplomate, American Board of Sleep Medicine   NPI: [id number] [place], American Board of Sleep Medicine  ELECTRONICALLY SIGNED ON:  09/23/2015, 11:34 AM [place] PH: [phone]   FX: [phone] [place]

## 2015-10-05 ENCOUNTER — Encounter: Payer: Self-pay | Admitting: Student

## 2015-10-05 ENCOUNTER — Ambulatory Visit (INDEPENDENT_AMBULATORY_CARE_PROVIDER_SITE_OTHER): Payer: 59 | Admitting: Student

## 2015-10-05 DIAGNOSIS — I10 Essential (primary) hypertension: Secondary | ICD-10-CM

## 2015-10-05 DIAGNOSIS — M7989 Other specified soft tissue disorders: Secondary | ICD-10-CM | POA: Diagnosis not present

## 2015-10-05 NOTE — Assessment & Plan Note (Signed)
>>  ASSESSMENT AND PLAN FOR DM (DIABETES MELLITUS) WITH COMPLICATIONS (HCC) WRITTEN ON 10/05/2015  2:28 PM BY HANEY, ALYSSA A, MD  Diabetic foot exam today. Due for A1c in one month. Patient counseled to obtain diabetic ophthalmologic exam.

## 2015-10-05 NOTE — Patient Instructions (Signed)
Follow-up in one month for A1c check  please obtain diabetic eye exam Your sleep study was normal. If questions or concerns call the office at [phone]

## 2015-10-05 NOTE — Assessment & Plan Note (Signed)
Diabetic foot exam today. Due for A1c in one month. Patient counseled to obtain diabetic ophthalmologic exam.

## 2015-10-05 NOTE — Progress Notes (Signed)
   Subjective:    Patient ID: [name], female    DOB: 04/19/1960, 55 y.o.   MRN: [id number]   CC: Blood pressure check  HPI:  55 year old female with history of accelerated hypertension presents for blood pressure check  Blood pressure - Reports compliance with current blood pressure regimen - Denies headaches, changes in vision, shortness of breath, chest pain  Lower extremity swelling/bloating -Feels that she got waxing and waning degrees of feet swelling and abdominal bloating - She is unsure if this is related to the amount of salt she eats her diet  Smoking status reviewed  Review of Systems  Per HPI, else denies recent illness, fever, abdominal pain, N/V/D, weakness    Objective:  BP (!) 148/60   Pulse 77   Temp 98.3 F (36.8 C) (Oral)   Wt 205 lb (93 kg)   SpO2 99%   BMI 36.31 kg/m  Vitals and nursing note reviewed  General: NAD Cardiac: RRR,  Respiratory: CTAB, normal effort Extremities: 1+ edema of bilateral dorsum of feet, no calf tenderness or tenderness or warmth Skin: warm and dry, no rashes noted Neuro: alert and oriented,    Assessment & Plan:    Essential hypertension Blood pressure 148/60 in clinic in clinic today much improved from previous - Continue current regimen of Coreg and lisinopril - Consider add addition of hctz as add-on in future   Swelling of lower extremity Mild bilateral pedal edema. No calf tenderness or calf swelling. Likely related to salt intake -start compression stockings to wear during the day - Continue to follow  Diabetes ([place]) Diabetic foot exam today. Due for A1c in one month. Patient counseled to obtain diabetic ophthalmologic exam.    Henery Betzold A. Lincoln Brigham MD, [facility] Resident PGY-3 Pager [phone]

## 2015-10-05 NOTE — Assessment & Plan Note (Signed)
Blood pressure 148/60 in clinic in clinic today much improved from previous - Continue current regimen of Coreg and lisinopril - Consider add addition of hctz as add-on in future

## 2015-10-05 NOTE — Assessment & Plan Note (Signed)
Mild bilateral pedal edema. No calf tenderness or calf swelling. Likely related to salt intake -start compression stockings to wear during the day - Continue to follow

## 2015-10-09 ENCOUNTER — Other Ambulatory Visit: Payer: Self-pay | Admitting: Family Medicine

## 2015-10-09 DIAGNOSIS — E11319 Type 2 diabetes mellitus with unspecified diabetic retinopathy without macular edema: Secondary | ICD-10-CM

## 2015-10-27 ENCOUNTER — Other Ambulatory Visit: Payer: Self-pay | Admitting: Family Medicine

## 2015-10-27 DIAGNOSIS — E11319 Type 2 diabetes mellitus with unspecified diabetic retinopathy without macular edema: Secondary | ICD-10-CM

## 2015-10-27 MED ORDER — GLUCOSE BLOOD VI STRP: ORAL_STRIP | 3 refills | Status: DC

## 2015-10-31 DIAGNOSIS — H25013 Cortical age-related cataract, bilateral: Secondary | ICD-10-CM | POA: Diagnosis not present

## 2015-10-31 DIAGNOSIS — E119 Type 2 diabetes mellitus without complications: Secondary | ICD-10-CM | POA: Diagnosis not present

## 2015-11-17 ENCOUNTER — Encounter: Payer: Self-pay | Admitting: Student

## 2015-11-17 ENCOUNTER — Ambulatory Visit (INDEPENDENT_AMBULATORY_CARE_PROVIDER_SITE_OTHER): Payer: 59 | Admitting: Student

## 2015-11-17 VITALS — BP 128/84 | HR 73 | Temp 97.0°F | Ht 63.0 in | Wt 210.0 lb

## 2015-11-17 DIAGNOSIS — Z23 Encounter for immunization: Secondary | ICD-10-CM

## 2015-11-17 DIAGNOSIS — D696 Thrombocytopenia, unspecified: Secondary | ICD-10-CM

## 2015-11-17 DIAGNOSIS — R7303 Prediabetes: Secondary | ICD-10-CM

## 2015-11-17 DIAGNOSIS — I10 Essential (primary) hypertension: Secondary | ICD-10-CM

## 2015-11-17 DIAGNOSIS — E11319 Type 2 diabetes mellitus with unspecified diabetic retinopathy without macular edema: Secondary | ICD-10-CM | POA: Diagnosis not present

## 2015-11-17 LAB — POCT GLYCOSYLATED HEMOGLOBIN (HGB A1C): HEMOGLOBIN A1C: 6.3

## 2015-11-17 MED ORDER — ASPIRIN EC 81 MG PO TBEC: 81.0000 mg | DELAYED_RELEASE_TABLET | Freq: Every day | ORAL | 3 refills | Status: DC

## 2015-11-17 NOTE — Progress Notes (Signed)
   Subjective:    Patient ID: [name], female    DOB: 11-10-1960, 55 y.o.   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 WWP. Skin: warm and dry, no rashes noted Neuro: alert and oriented,    Assessment & Plan:    Prediabetes A1c 6.3 and had remained prediabetic for last two reading. Will continue metformin and space A1c checks. - next A1c 6 mo - diet and exercise counseling discussed  Essential hypertension BP at goal - continue current regimen - on spironoloactone, recheck Cr and K in 2-3 months  Idiopathic thrombocytopenic purpura (ITP) Plts have remained stable - will start asa 81 and recheck CBC in one month    Cregg Jutte A. Lincoln Brigham MD, [facility] Resident PGY-3 Pager [phone]

## 2015-11-17 NOTE — Assessment & Plan Note (Signed)
>>  ASSESSMENT AND PLAN FOR DM (DIABETES MELLITUS) WITH COMPLICATIONS (HCC) WRITTEN ON 11/17/2015  5:17 PM BY HANEY, ALYSSA A, MD  A1c 6.3 and had remained prediabetic for last two reading. Will continue metformin  and space A1c checks. - next A1c 6 mo - diet and exercise counseling discussed

## 2015-11-17 NOTE — Assessment & Plan Note (Signed)
A1c 6.3 and had remained prediabetic for last two reading. Will continue metformin and space A1c checks. - next A1c 6 mo - diet and exercise counseling discussed

## 2015-11-17 NOTE — Assessment & Plan Note (Addendum)
BP at goal - continue current regimen - on spironoloactone, recheck Cr and K in 2-3 months

## 2015-11-17 NOTE — Patient Instructions (Addendum)
Follow up in 1 month for CBC Take aspirin daily, unless directed to stop by your oncologist Congratulations! Your A1c is 6.3. Continue your metformin and check your blood sugars once daily If you have any questions or concerns, call the office at [phone]

## 2015-11-17 NOTE — Assessment & Plan Note (Signed)
Plts have remained stable - will start asa 81 and recheck CBC in one month

## 2015-11-24 ENCOUNTER — Ambulatory Visit (HOSPITAL_BASED_OUTPATIENT_CLINIC_OR_DEPARTMENT_OTHER): Payer: 59 | Admitting: Hematology and Oncology

## 2015-11-24 ENCOUNTER — Other Ambulatory Visit (HOSPITAL_BASED_OUTPATIENT_CLINIC_OR_DEPARTMENT_OTHER): Payer: 59

## 2015-11-24 ENCOUNTER — Encounter: Payer: Self-pay | Admitting: Hematology and Oncology

## 2015-11-24 DIAGNOSIS — C50111 Malignant neoplasm of central portion of right female breast: Secondary | ICD-10-CM | POA: Diagnosis not present

## 2015-11-24 DIAGNOSIS — C50411 Malignant neoplasm of upper-outer quadrant of right female breast: Secondary | ICD-10-CM

## 2015-11-24 DIAGNOSIS — Z79810 Long term (current) use of selective estrogen receptor modulators (SERMs): Secondary | ICD-10-CM

## 2015-11-24 DIAGNOSIS — Z170 Estrogen receptor positive status [ER+]: Secondary | ICD-10-CM

## 2015-11-24 MED ORDER — TAMOXIFEN CITRATE 20 MG PO TABS: 20.0000 mg | ORAL_TABLET | Freq: Every day | ORAL | 3 refills | Status: DC

## 2015-11-24 NOTE — Assessment & Plan Note (Signed)
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 Started Tamoxifen 09/2010  Tamoxifen toxicities: 1. Hot flashes improved with Effexor 2. Plan is to continue tamoxifen for 10 years versus switching her to aromatase inhibitors once she reaches menopause FSH and estradiol levels were drawn today and I will call her with the results of this test. The decision would be to switch her to aromatase inhibitor therapy if she is menopausal.  Breast cancer surveillance: 1. Breast exam 11/24/2015 is normal 2. Mammogram 04/27/2015 is normal on the left breast category B breast density  Patient complains of hair loss.

## 2015-11-24 NOTE — Progress Notes (Signed)
Patient Care Team: Veatrice Bourbon, MD as PCP - General (Family Medicine)  DIAGNOSIS:  Encounter Diagnosis  Name Primary?  . Malignant neoplasm of central portion of right breast in female, estrogen receptor positive ([place])     SUMMARY OF ONCOLOGIC HISTORY:   Cancer of central portion of right female breast ([place])   07/21/2009 Surgery    Right breast mastectomy: 2 foci of invasive ductal carcinoma 2.2 cm, 0.5 cm, ER positive PR positive HER-2 negative Ki-67 11%, Oncotype score 27, 18% ROR      10/23/2009 - 12/25/2009 Chemotherapy    Adjuvant chemotherapy with Taxotere and Cytoxan every 3 weeks 4 cycles followed by reconstruction of the breast by Dr. Harlow Mares      02/13/2010 -  Anti-estrogen oral therapy    Tamoxifen 20 mg daily      02/19/2010 - 03/28/2010 Radiation Therapy    Adjuvant radiation therapy      09/12/2010 Surgery    Recurrence of invasive ductal carcinoma of the right mastectomy scar is a nodule, invasive ductal carcinoma margins positive, ER 96%, PR 75%, HER-2 negative      09/17/2013 Surgery    Left breast reduction surgery       CHIEF COMPLIANT: Follow-up on tamoxifen therapy  INTERVAL HISTORY: [name] is a  55 year old with above-mentioned history of right breast cancer treated with mastectomy followed by adjuvant chemotherapy and radiation and is currently on oral antiestrogen therapy with tamoxifen since 2012. She has had hot flashes related to tamoxifen. The original plan was to switch her to aromatase inhibitor therapy when she became menopausal. Last year her blood work did not suggest menopause yet. She takes Effexor for hot flashes. She denies any lumps or nodules in breast.  REVIEW OF SYSTEMS:   Constitutional: Denies fevers, chills or abnormal weight loss Eyes: Denies blurriness of vision Ears, nose, mouth, throat, and face: Denies mucositis or sore throat Respiratory: Denies cough, dyspnea or wheezes Cardiovascular: Denies palpitation, chest  discomfort Gastrointestinal:  Denies nausea, heartburn or change in bowel habits Skin: Denies abnormal skin rashes Lymphatics: Denies new lymphadenopathy or easy bruising Neurological:Denies numbness, tingling or new weaknesses Behavioral/Psych: Mood is stable, no new changes  Extremities: No lower extremity edema Breast:  denies any pain or lumps or nodules in either breasts All other systems were reviewed with the patient and are negative.  I have reviewed the past medical history, past surgical history, social history and family history with the patient and they are unchanged from previous note.  ALLERGIES:  has No Known Allergies.  MEDICATIONS:  Current Outpatient Prescriptions  Medication Sig Dispense Refill  . aspirin EC 81 MG tablet Take 1 tablet (81 mg total) by mouth daily. 90 tablet 3  . Blood Glucose Monitoring Suppl (ONE TOUCH ULTRA 2) w/Device KIT USE   TO CHECK GLUCOSE UP TO THREE TIMES DAILY AS NEEDED 1 each 0  . carvedilol (COREG) 25 MG tablet TAKE ONE TABLET BY MOUTH TWICE DAILY WITH MEALS 60 tablet 5  . glucose blood (ONE TOUCH ULTRA TEST) test strip Check blood sugar once daily 100 each 3  . Lancets (ONETOUCH ULTRASOFT) lancets Check daily as needed 100 each 12  . lisinopril (PRINIVIL,ZESTRIL) 40 MG tablet Take 1 tablet (40 mg total) by mouth daily. 90 tablet 3  . metFORMIN (GLUCOPHAGE-XR) 500 MG 24 hr tablet TAKE ONE TABLET BY MOUTH ONCE DAILY WITH BREAKFAST 90 tablet 1  . oxyCODONE-acetaminophen (PERCOCET/ROXICET) 5-325 MG tablet Take 1 tablet by mouth every 4 (  four) hours as needed for severe pain. (Patient not taking: Reported on 07/31/2015) 6 tablet 0  . pravastatin (PRAVACHOL) 40 MG tablet Take 1 tablet (40 mg total) by mouth every evening. 30 tablet 6  . traMADol (ULTRAM) 50 MG tablet TAKE ONE TABLET BY MOUTH TWICE DAILY AS NEEDED 30 tablet 1   No current facility-administered medications for this visit.     PHYSICAL EXAMINATION: ECOG PERFORMANCE STATUS: 1 -  Symptomatic but completely ambulatory  Vitals:   11/24/15 0942  BP: (!) 162/82  Pulse: 72  Resp: 18  Temp: 98.7 F (37.1 C)   Filed Weights   11/24/15 0942  Weight: 207 lb (93.9 kg)    GENERAL:alert, no distress and comfortable SKIN: skin color, texture, turgor are normal, no rashes or significant lesions EYES: normal, Conjunctiva are pink and non-injected, sclera clear OROPHARYNX:no exudate, no erythema and lips, buccal mucosa, and tongue normal  NECK: supple, thyroid normal size, non-tender, without nodularity LYMPH:  no palpable lymphadenopathy in the cervical, axillary or inguinal LUNGS: clear to auscultation and percussion with normal breathing effort HEART: regular rate & rhythm and no murmurs and no lower extremity edema ABDOMEN:abdomen soft, non-tender and normal bowel sounds MUSCULOSKELETAL:no cyanosis of digits and no clubbing  NEURO: alert & oriented x 3 with fluent speech, no focal motor/sensory deficits EXTREMITIES: No lower extremity edema BREAST: No palpable masses or nodules in either right or left breasts. No palpable axillary supraclavicular or infraclavicular adenopathy no breast tenderness or nipple discharge. (exam performed in the presence of a chaperone)  LABORATORY DATA:  I have reviewed the data as listed   Chemistry      Component Value Date/Time   NA 140 08/30/2015 1457   NA 143 11/22/2014 0805   K 4.0 08/30/2015 1457   K 3.1 (L) 11/22/2014 0805   CL 106 08/30/2015 1457   CL 105 02/21/2012 0806   CO2 26 08/30/2015 1457   CO2 28 11/22/2014 0805   BUN 11 08/30/2015 1457   BUN 12.2 11/22/2014 0805   CREATININE 0.88 08/30/2015 1457   CREATININE 0.9 11/22/2014 0805      Component Value Date/Time   CALCIUM 9.2 08/30/2015 1457   CALCIUM 9.9 11/22/2014 0805   ALKPHOS 43 06/27/2015 1415   ALKPHOS 43 11/22/2014 0805   AST 43 (H) 06/27/2015 1415   AST 65 (H) 11/22/2014 0805   ALT 38 (H) 06/27/2015 1415   ALT 44 11/22/2014 0805   BILITOT 0.2  06/27/2015 1415   BILITOT 0.58 11/22/2014 0805       Lab Results  Component Value Date   WBC 8.1 03/12/2015   HGB 13.1 03/12/2015   HCT 40.6 03/12/2015   MCV 76.3 (L) 03/12/2015   PLT 107 (L) 03/12/2015   NEUTROABS 3.6 03/12/2015     ASSESSMENT & PLAN:  Cancer of central portion of right female breast ([place]) 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 Started Tamoxifen 09/2010  Tamoxifen toxicities: 1. Hot flashes improved with Effexor 2. joint and muscle stiffness: I encouraged her to exercise and stay active. Plan is to continue tamoxifen for 10 years versus switching her to aromatase inhibitors once she reaches menopause FSH and estradiol levels were drawn today and I  will call her with the results of this test. The decision would be to switch her to aromatase inhibitor therapy if she is menopausal. However because anastrozole but increase or myalgias, we may elect to keep her on tamoxifen alone.  Breast cancer surveillance: 1. Breast exam 11/24/2015 is normal 2. Mammogram 04/27/2015 is normal on the left breast category B breast density  Chronic mild thrombocytopenia: Will check platelet counts next year Patient complains of hair loss. Return to clinic in 1 year for follow-up  No orders of the defined types were placed in this encounter.  The patient has a good understanding of the overall plan. she agrees with it. she will call with any problems that may develop before the next visit here.   Rulon Eisenmenger, MD 11/24/15

## 2015-11-25 LAB — FOLLICLE STIMULATING HORMONE: FSH: 24.5 m[IU]/mL

## 2015-12-20 ENCOUNTER — Encounter: Payer: Self-pay | Admitting: Student

## 2015-12-20 ENCOUNTER — Ambulatory Visit (INDEPENDENT_AMBULATORY_CARE_PROVIDER_SITE_OTHER): Payer: 59 | Admitting: Student

## 2015-12-20 VITALS — BP 142/69 | HR 79 | Temp 98.9°F | Wt 211.0 lb

## 2015-12-20 DIAGNOSIS — I10 Essential (primary) hypertension: Secondary | ICD-10-CM

## 2015-12-20 DIAGNOSIS — R7303 Prediabetes: Secondary | ICD-10-CM

## 2015-12-20 DIAGNOSIS — D696 Thrombocytopenia, unspecified: Secondary | ICD-10-CM | POA: Diagnosis not present

## 2015-12-20 MED ORDER — HYDROCHLOROTHIAZIDE 12.5 MG PO CAPS: 12.5000 mg | ORAL_CAPSULE | Freq: Every day | ORAL | 1 refills | Status: DC

## 2015-12-20 NOTE — Assessment & Plan Note (Signed)
CBC today to monitor platelet count in the setting of aspirin use. If platelets are falling will consider stopping aspirin

## 2015-12-20 NOTE — Progress Notes (Signed)
   Subjective:    Patient ID: [name], female    DOB: 1960-02-19, 55 y.o.   MRN: [id number]   CC: Thrombocytopenia  HPI: 55 year old female with a history of hypertension, idiopathic thrombocytopenia presents for CBC after starting aspirin   Thrombocytopenia - Has been taking baby aspirin daily as prescribed - Denies easy bruising, bleeding gums, easy bleeding - Denies headache  Hypertension -States blood pressure at home has been in the high 130s/70s - Denies headache, chest pain, shortness of breath  Diabetes - Reports having had an eye exam last month which was normal  Smoking status reviewed Nonsmoker   Review of Systems  Per HPI, else denies recent illness, fever, changes in vision, abdominal pain, N/V/D,     Objective:  BP (!) 142/69   Pulse 79   Temp 98.9 F (37.2 C) (Oral)   Wt 211 lb (95.7 kg)   SpO2 97%   BMI 37.38 kg/m  Vitals and nursing note reviewed  General: NAD Cardiac: RRR Respiratory: CTAB, normal effort Skin: warm and dry, no rashes noted Neuro: alert and oriented   Assessment & Plan:    Essential hypertension Blood pressure mildly above goal 142/69 and same on repeat. No evidence of symptoms associated. However given, and history diabetes now prediabetes concern for need for more stringent blood pressure cut off. - Will start hydrochlorothiazide -   previously was on spironolactone but no longer takes this  - Follow up in one week for blood pressure check, BMP  Idiopathic thrombocytopenic purpura (ITP) CBC today to monitor platelet count in the setting of aspirin use. If platelets are falling will consider stopping aspirin  Prediabetes Diet and exercise discussed. Will follow in 2 months for A1c. Up-to-date for foot exam, ophthalmology exam.    Alyssa A. Lincoln Brigham MD, [facility] Resident PGY-3 Pager [phone]

## 2015-12-20 NOTE — Patient Instructions (Addendum)
Follow up in 1 week for BP check If you start to have lightheadedness or dizziness, DO NOT take the hydrochlorothiazide If you have any questions or concerns, call the office at [phone]

## 2015-12-20 NOTE — Assessment & Plan Note (Signed)
>>  ASSESSMENT AND PLAN FOR DM (DIABETES MELLITUS) WITH COMPLICATIONS (HCC) WRITTEN ON 12/20/2015  2:09 PM BY HANEY, ALYSSA A, MD  Diet and exercise discussed. Will follow in 2 months for A1c. Up-to-date for foot exam, ophthalmology exam.

## 2015-12-20 NOTE — Assessment & Plan Note (Signed)
Diet and exercise discussed. Will follow in 2 months for A1c. Up-to-date for foot exam, ophthalmology exam.

## 2015-12-20 NOTE — Assessment & Plan Note (Signed)
Blood pressure mildly above goal 142/69 and same on repeat. No evidence of symptoms associated. However given, and history diabetes now prediabetes concern for need for more stringent blood pressure cut off. - Will start hydrochlorothiazide -   previously was on spironolactone but no longer takes this  - Follow up in one week for blood pressure check, BMP

## 2015-12-21 LAB — CBC WITH DIFFERENTIAL/PLATELET
Basophils Absolute: 0 cells/uL (ref 0–200)
Basophils Relative: 0 %
EOS ABS: 156 {cells}/uL (ref 15–500)
EOS PCT: 2 %
HCT: 37.7 % (ref 35.0–45.0)
Hemoglobin: 12 g/dL (ref 11.7–15.5)
LYMPHS PCT: 43 %
Lymphs Abs: 3354 cells/uL (ref 850–3900)
MCH: 24.2 pg — ABNORMAL LOW (ref 27.0–33.0)
MCHC: 31.8 g/dL — ABNORMAL LOW (ref 32.0–36.0)
MCV: 76.2 fL — AB (ref 80.0–100.0)
MONOS PCT: 7 %
Monocytes Absolute: 546 cells/uL (ref 200–950)
Neutro Abs: 3744 cells/uL (ref 1500–7800)
Neutrophils Relative %: 48 %
PLATELETS: 69 10*3/uL — AB (ref 140–400)
RBC: 4.95 MIL/uL (ref 3.80–5.10)
RDW: 15.6 % — AB (ref 11.0–15.0)
WBC: 7.8 10*3/uL (ref 3.8–10.8)

## 2015-12-26 ENCOUNTER — Encounter: Payer: Self-pay | Admitting: Student

## 2015-12-26 ENCOUNTER — Ambulatory Visit (INDEPENDENT_AMBULATORY_CARE_PROVIDER_SITE_OTHER): Payer: 59 | Admitting: *Deleted

## 2015-12-26 ENCOUNTER — Other Ambulatory Visit: Payer: 59

## 2015-12-26 VITALS — BP 140/82 | HR 81 | Ht 63.0 in | Wt 208.0 lb

## 2015-12-26 DIAGNOSIS — I10 Essential (primary) hypertension: Secondary | ICD-10-CM

## 2015-12-26 LAB — BASIC METABOLIC PANEL
BUN: 14 mg/dL (ref 7–25)
CALCIUM: 9.3 mg/dL (ref 8.6–10.4)
CO2: 27 mmol/L (ref 20–31)
Chloride: 103 mmol/L (ref 98–110)
Creat: 1 mg/dL (ref 0.50–1.05)
Glucose, Bld: 97 mg/dL (ref 65–99)
Potassium: 4.2 mmol/L (ref 3.5–5.3)
SODIUM: 139 mmol/L (ref 135–146)

## 2015-12-26 NOTE — Progress Notes (Signed)
Patient presents for BP check and labs.  Has taken her blood pressure medication today and her blood pressure was normal.  Patient is down 3lbs from last visit. Will forward to MD to make aware. Jazmin Hartsell,CMA

## 2016-01-04 ENCOUNTER — Ambulatory Visit (HOSPITAL_COMMUNITY)
Admission: EM | Admit: 2016-01-04 | Discharge: 2016-01-04 | Disposition: A | Discharge status: Home / Self Care | Payer: 59 | Attending: Family Medicine | Admitting: Family Medicine

## 2016-01-04 ENCOUNTER — Encounter (HOSPITAL_COMMUNITY): Payer: Self-pay | Admitting: Emergency Medicine

## 2016-01-04 DIAGNOSIS — J40 Bronchitis, not specified as acute or chronic: Secondary | ICD-10-CM

## 2016-01-04 MED ORDER — AZITHROMYCIN 250 MG PO TABS: 250.0000 mg | ORAL_TABLET | Freq: Every day | ORAL | 0 refills | Status: DC

## 2016-01-04 MED ORDER — HYDROCODONE-HOMATROPINE 5-1.5 MG/5ML PO SYRP: 5.0000 mL | ORAL_SOLUTION | Freq: Four times a day (QID) | ORAL | 0 refills | Status: DC | PRN

## 2016-01-04 NOTE — ED Provider Notes (Signed)
[facility]    CSN: [id number] Arrival date & time: 01/04/16  1205     History   Chief Complaint Chief Complaint  Patient presents with  . Cough    HPI [name] is a 55 y.o. female.   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 She's not had any asthma history or cigarette smoking history. She's had no shortness of breath or fever.  Patient denies sinus congestion or sore throat      Past Medical History:  Diagnosis Date  . Arthritis   . Breast CA ([place])    breast CA- right breast  . Diabetes mellitus   . Heel spur    bilateral  . Hyperlipidemia   . Hypertension   . Thrombocytopenia ([place])   . Thyroid disease    parathyroid removed    Patient Active Problem List   Diagnosis Date Noted  . Swelling of lower extremity 10/05/2015  . Obesity 08/30/2015  . Right anterior knee pain 04/18/2015  . Dyspnea 04/17/2015  . Palpitations 03/10/2015  . 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 Hypokalemia 12/27/2014  . Cataracts, bilateral 09/13/2014  . Glaucoma 09/13/2014  . Ear pain 05/27/2014  . Tachycardia 03/16/2014  . Mitral regurgitation 02/18/2014  . Lateral pain of right hip 01/07/2014  . 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 Transaminitis 11/13/2012  . Abdominal spasms 05/01/2012  . Personal history of colonic polyps 04/17/2012  . Bilateral foot pain 02/21/2012  . Fatigue 11/12/2011  . Health care maintenance 11/12/2011  . Plantar fasciitis 06/12/2011  . Hyperlipidemia 04/04/2011  . Prediabetes 06/07/2010  . Cancer of central portion of right female breast ([place]) 10/04/2009  . Idiopathic thrombocytopenic purpura (ITP) 06/22/2009  . Essential hypertension 06/22/2009  . HYPERPARATHYROIDISM, HX OF 06/22/2009  . Avitaminosis D 09/01/2008    Past Surgical History:  Procedure Laterality Date  . ABDOMINAL HYSTERECTOMY     1996 per pt  . BREAST SURGERY  09/12/10     excision of skin mass - right breast, had another surgery to remove more Cancer cells  . INNER EAR SURGERY  06/01/13  . PARATHYROIDECTOMY     partial?    OB History    No data available       Home Medications    Prior to Admission medications   Medication Sig Start Date End Date Taking? Authorizing Provider  Blood Glucose Monitoring Suppl (ONE TOUCH ULTRA 2) w/Device KIT USE   TO CHECK GLUCOSE UP TO THREE TIMES DAILY AS NEEDED 10/11/15  Yes Elberta Leatherwood, MD  carvedilol (COREG) 25 MG tablet TAKE ONE TABLET BY MOUTH TWICE DAILY WITH MEALS 09/04/15  Yes Alyssa A Haney, MD  glucose blood (ONE TOUCH ULTRA TEST) test strip Check blood sugar once daily 10/27/15  Yes Alyssa A Haney, MD  hydrochlorothiazide (MICROZIDE) 12.5 MG capsule Take 1 capsule (12.5 mg total) by mouth daily. 12/20/15  Yes Veatrice Bourbon, MD  Lancets (ONETOUCH ULTRASOFT) lancets Check daily as needed 07/19/15  Yes Olam Idler, MD  lisinopril (PRINIVIL,ZESTRIL) 40 MG tablet Take 1 tablet (40 mg total) by mouth daily. 08/21/15  Yes Alyssa A Haney, MD  metFORMIN (GLUCOPHAGE-XR) 500 MG 24 hr tablet TAKE ONE TABLET BY MOUTH ONCE DAILY WITH BREAKFAST 10/27/15  Yes Alyssa A Haney, MD  pravastatin (PRAVACHOL) 40 MG tablet Take 1 tablet (40 mg total) by mouth every evening. 04/17/15  Yes Jeneen Rinks  Hochrein, MD  tamoxifen (NOLVADEX) 20 MG tablet Take 1 tablet (20 mg total) by mouth daily. 11/24/15  Yes Nicholas Lose, MD  traMADol (ULTRAM) 50 MG tablet TAKE ONE TABLET BY MOUTH TWICE DAILY AS NEEDED 07/20/15  Yes Olam Idler, MD  aspirin EC 81 MG tablet Take 1 tablet (81 mg total) by mouth daily. 11/17/15 11/16/16  Veatrice Bourbon, MD  azithromycin (ZITHROMAX) 250 MG tablet Take 1 tablet (250 mg total) by mouth daily. Take first 2 tablets together, then 1 every day until finished. 01/04/16   Robyn Haber, MD  HYDROcodone-homatropine [facility]) 5-1.5 MG/5ML syrup Take 5 mLs by mouth every 6 (six) hours as needed for cough. 01/04/16   Robyn Haber, MD    Family History Family History  Problem Relation Age of Onset  . CAD Mother 60    CABG  . Stroke Father   . Heart attack Brother 85  . Colon cancer Neg Hx   . Esophageal cancer Neg Hx   . Rectal cancer Neg Hx   . Stomach cancer Neg Hx     Social History Social History  Substance Use Topics  . Smoking status: Never Smoker  . Smokeless tobacco: Never Used  . Alcohol use No     Allergies   Patient has no known allergies.   Review of Systems Review of Systems  Constitutional: Negative.   HENT: Negative.   Respiratory: Positive for cough and wheezing.   Cardiovascular: Negative.   Gastrointestinal: Negative.   Genitourinary: Negative.   Neurological: Negative.      Physical Exam Triage Vital Signs ED Triage Vitals [01/04/16 1241]  Enc Vitals Group     BP 115/65     Pulse Rate 75     Resp      Temp 98.7 F (37.1 C)     Temp Source Oral     SpO2 100 %     Weight      Height      Head Circumference      Peak Flow      Pain Score 0     Pain Loc      Pain Edu?      Excl. in [place]?    No data found.   Updated Vital Signs BP 115/65 (BP Location: Left Arm)   Pulse 75   Temp 98.7 F (37.1 C) (Oral)   SpO2 100%    Physical Exam  Constitutional: She is oriented to person, place, and time. She appears well-developed and well-nourished.  HENT:  Head: Normocephalic and atraumatic.  Right Ear: External ear normal.  Left Ear: External ear normal.  Mouth/Throat: Oropharynx is clear and moist.  Eyes: Conjunctivae and EOM are normal. Pupils are equal, round, and reactive to light.  Neck: Normal range of motion. Neck supple.  Cardiovascular: Normal rate, regular rhythm and normal heart sounds.   Pulmonary/Chest: Effort normal. She has wheezes.  Musculoskeletal: Normal range of motion.  Neurological: She is alert and oriented to person, place, and time.  Skin: Skin is warm and dry.  Psychiatric: She has a normal mood and affect. Her behavior  is normal.  Nursing note and vitals reviewed.    UC Treatments / Results  Labs (all labs ordered are listed, but only abnormal results are displayed) Labs Reviewed - No data to display  EKG  EKG Interpretation None       Radiology No results found.  Procedures Procedures (including critical care time)  Medications Ordered in UC  Medications - No data to display   Initial Impression / Assessment and Plan / UC Course  I have reviewed the triage vital signs and the nursing notes.  Pertinent labs & imaging results that were available during my care of the patient were reviewed by me and considered in my medical decision making (see chart for details).  Clinical Course     Final Clinical Impressions(s) / UC Diagnoses   Final diagnoses:  Bronchitis    New Prescriptions New Prescriptions   AZITHROMYCIN (ZITHROMAX) 250 MG TABLET    Take 1 tablet (250 mg total) by mouth daily. Take first 2 tablets together, then 1 every day until finished.   HYDROCODONE-HOMATROPINE (HYCODAN) 5-1.5 MG/5ML SYRUP    Take 5 mLs by mouth every 6 (six) hours as needed for cough.     Robyn Haber, MD 01/04/16 1320

## 2016-01-04 NOTE — ED Triage Notes (Signed)
Pt has been suffering from a productive cough for two weeks.  She has been trying Robitussin with no relief.  She reports some wheezing.  She denies fever, nasal congestion, or ear pressure or pain.

## 2016-02-26 DIAGNOSIS — C50911 Malignant neoplasm of unspecified site of right female breast: Secondary | ICD-10-CM | POA: Diagnosis not present

## 2016-02-26 DIAGNOSIS — Z9012 Acquired absence of left breast and nipple: Secondary | ICD-10-CM | POA: Diagnosis not present

## 2016-03-04 DIAGNOSIS — C50911 Malignant neoplasm of unspecified site of right female breast: Secondary | ICD-10-CM | POA: Diagnosis not present

## 2016-03-04 DIAGNOSIS — Z9012 Acquired absence of left breast and nipple: Secondary | ICD-10-CM | POA: Diagnosis not present

## 2016-03-05 DIAGNOSIS — Z9012 Acquired absence of left breast and nipple: Secondary | ICD-10-CM | POA: Diagnosis not present

## 2016-03-05 DIAGNOSIS — C50911 Malignant neoplasm of unspecified site of right female breast: Secondary | ICD-10-CM | POA: Diagnosis not present

## 2016-03-16 ENCOUNTER — Other Ambulatory Visit: Payer: Self-pay | Admitting: Student

## 2016-03-16 DIAGNOSIS — I10 Essential (primary) hypertension: Secondary | ICD-10-CM

## 2016-03-25 ENCOUNTER — Other Ambulatory Visit: Payer: Self-pay | Admitting: Hematology and Oncology

## 2016-03-25 DIAGNOSIS — Z1231 Encounter for screening mammogram for malignant neoplasm of breast: Secondary | ICD-10-CM

## 2016-03-25 DIAGNOSIS — Z9011 Acquired absence of right breast and nipple: Secondary | ICD-10-CM

## 2016-04-03 ENCOUNTER — Other Ambulatory Visit: Payer: Self-pay | Admitting: Family Medicine

## 2016-04-03 ENCOUNTER — Encounter: Payer: Self-pay | Admitting: Student

## 2016-04-03 ENCOUNTER — Telehealth: Payer: Self-pay | Admitting: Student

## 2016-04-03 ENCOUNTER — Ambulatory Visit (INDEPENDENT_AMBULATORY_CARE_PROVIDER_SITE_OTHER): Payer: 59 | Admitting: Student

## 2016-04-03 VITALS — BP 138/86 | HR 82 | Temp 98.2°F | Ht 63.0 in | Wt 213.4 lb

## 2016-04-03 DIAGNOSIS — M19041 Primary osteoarthritis, right hand: Secondary | ICD-10-CM | POA: Diagnosis not present

## 2016-04-03 DIAGNOSIS — R7303 Prediabetes: Secondary | ICD-10-CM | POA: Diagnosis not present

## 2016-04-03 DIAGNOSIS — M19042 Primary osteoarthritis, left hand: Secondary | ICD-10-CM | POA: Diagnosis not present

## 2016-04-03 DIAGNOSIS — R748 Abnormal levels of other serum enzymes: Secondary | ICD-10-CM | POA: Diagnosis not present

## 2016-04-03 DIAGNOSIS — I10 Essential (primary) hypertension: Secondary | ICD-10-CM

## 2016-04-03 LAB — POCT GLYCOSYLATED HEMOGLOBIN (HGB A1C): HEMOGLOBIN A1C: 6.5

## 2016-04-03 MED ORDER — PRAVASTATIN SODIUM 40 MG PO TABS
40.0000 mg | ORAL_TABLET | Freq: Every evening | ORAL | 6 refills | Status: DC
Start: 2016-04-03 — End: 2016-12-12

## 2016-04-03 MED ORDER — METFORMIN HCL ER 750 MG PO TB24: 750.0000 mg | ORAL_TABLET | Freq: Every day | ORAL | 1 refills | Status: DC

## 2016-04-03 MED ORDER — HYDROCHLOROTHIAZIDE 12.5 MG PO CAPS: 12.5000 mg | ORAL_CAPSULE | Freq: Every day | ORAL | 1 refills | Status: DC

## 2016-04-03 MED ORDER — METFORMIN HCL ER 500 MG PO TB24: ORAL_TABLET | ORAL | 2 refills | Status: DC

## 2016-04-03 MED ORDER — LISINOPRIL 40 MG PO TABS: 40.0000 mg | ORAL_TABLET | Freq: Every day | ORAL | 3 refills | Status: DC

## 2016-04-03 MED ORDER — TRAMADOL HCL 50 MG PO TABS
50.0000 mg | ORAL_TABLET | Freq: Two times a day (BID) | ORAL | 1 refills | Status: DC | PRN
Start: 2016-04-03 — End: 2016-10-02

## 2016-04-03 MED ORDER — CARVEDILOL 25 MG PO TABS: 25.0000 mg | ORAL_TABLET | Freq: Two times a day (BID) | ORAL | 5 refills | Status: DC

## 2016-04-03 NOTE — Progress Notes (Addendum)
   Subjective:    Patient ID: [name], female    DOB: [birth year], 56 y.o.   MRN: [id number]   CC: follow up blood pressure  HPI: 56 y/o F presents for follow up blood pressure  Blood pressure - blood pressure at home has been in the 130s/70s, most recently 134/78 - denies headache, chest pain , SOB, changes in vision - she takes coreg and lisinopril as prescribed but has ben taking the HCTZ every other day of her blood pressure goes into the [id number] systolic - she initially states that she " feels bad" when her blood pressure is in the 120s but then stated that she feels over all better   Prediabetes - takes metformin for prediabetes - fasting blood sugars in the low 100s,. Last 118  Pain in hands - reports chronic pain ins her hands, no weakness or reduced ROM or reduced function  History of Transaminitis though to be secondary to NAFLD - pateint thinks it may be due to a medication - she requests repeat testing  Today  Smoking status reviewed  Review of Systems  Per HPI, else denies recent illness, fever   Objective:  BP 138/86   Pulse 82   Temp 98.2 F (36.8 C) (Oral)   Ht 5\' 3"  (1.6 m)   Wt 213 lb 6.4 oz (96.8 kg)   SpO2 99%   BMI 37.80 kg/m  Vitals and nursing note reviewed  General: NAD Cardiac: RRR,  Respiratory: CTAB, normal effort Extremities: no edema or cyanosis.  Skin: warm and dry, no rashes noted MSK: bilateral tenderness to palpation over bilateral CMC joints Neuro: alert and oriented, no focal deficits   Assessment & Plan:    Essential hypertension Continue current regimen. 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 Lincoln Brigham MD, [facility] Resident PGY-3 Pager [phone]

## 2016-04-03 NOTE — Assessment & Plan Note (Signed)
Repeat CMP today, LFTs previously trending down

## 2016-04-03 NOTE — Assessment & Plan Note (Addendum)
A1c today 6.5 - will increase metformin to 750 from 500 qD - will continue to follow

## 2016-04-03 NOTE — Patient Instructions (Addendum)
Follow up in 2 months with PCP Take aspirin 81 mg every day Take lisinopril every day If you have any questions or concerns, call the office at [phone]

## 2016-04-03 NOTE — Telephone Encounter (Signed)
Pt states she is returning Dr. Etta Grandchild call. Please call back. [phone] ep

## 2016-04-03 NOTE — Assessment & Plan Note (Signed)
>>  ASSESSMENT AND PLAN FOR DM (DIABETES MELLITUS) WITH COMPLICATIONS ([facility]) WRITTEN ON 04/03/2016  2:19 PM BY HANEY, ALYSSA A, MD  A1c today 6.5 - will increase metformin  to 750 from 500 qD - will continue to follow

## 2016-04-03 NOTE — Addendum Note (Signed)
Addended by: Marina Goodell A on: 04/03/2016 02:21 PM   Modules accepted: Orders

## 2016-04-03 NOTE — Assessment & Plan Note (Signed)
Continue current regimen. Patient advised to start taking HCTZ daily instead of every other day - BMP today - will follow as needed

## 2016-04-03 NOTE — Assessment & Plan Note (Signed)
History and exam consistent with arthritis of bilateral hands without reduced function - will continue conservative management for now, tylenol and tramadol PRN

## 2016-04-04 ENCOUNTER — Encounter: Payer: Self-pay | Admitting: Student

## 2016-04-04 LAB — COMPREHENSIVE METABOLIC PANEL
A/G RATIO: 1.4 (ref 1.2–2.2)
ALK PHOS: 39 IU/L (ref 39–117)
ALT: 30 IU/L (ref 0–32)
AST: 37 IU/L (ref 0–40)
Albumin: 4 g/dL (ref 3.5–5.5)
BUN/Creatinine Ratio: 13 (ref 9–23)
BUN: 11 mg/dL (ref 6–24)
CALCIUM: 9.6 mg/dL (ref 8.7–10.2)
CO2: 24 mmol/L (ref 18–29)
Chloride: 101 mmol/L (ref 96–106)
Creatinine, Ser: 0.87 mg/dL (ref 0.57–1.00)
GFR calc Af Amer: 86 mL/min/{1.73_m2} (ref >59)
GFR calc non Af Amer: 75 mL/min/{1.73_m2} (ref >59)
GLOBULIN, TOTAL: 2.8 g/dL (ref 1.5–4.5)
Glucose: 110 mg/dL — ABNORMAL HIGH (ref 65–99)
Potassium: 4.4 mmol/L (ref 3.5–5.2)
Sodium: 142 mmol/L (ref 134–144)
Total Protein: 6.8 g/dL (ref 6.0–8.5)

## 2016-04-04 LAB — LIPID PANEL
Chol/HDL Ratio: 2.6 ratio units (ref 0.0–4.4)
Cholesterol, Total: 177 mg/dL (ref 100–199)
HDL: 68 mg/dL (ref >39)
LDL CALC: 75 mg/dL (ref 0–99)
Triglycerides: 169 mg/dL — ABNORMAL HIGH (ref 0–149)
VLDL CHOLESTEROL CAL: 34 mg/dL (ref 5–40)

## 2016-04-04 NOTE — Telephone Encounter (Signed)
Will forward to MD. Khy Pitre,CMA  

## 2016-04-04 NOTE — Telephone Encounter (Signed)
Called the patient to inform her that her metformin was increased from 500 mg qD to 750 mg qD

## 2016-04-05 NOTE — Telephone Encounter (Signed)
LM for patient on work number to call the office back to be informed of medication change. Mana Morison,CMA

## 2016-04-24 ENCOUNTER — Ambulatory Visit (INDEPENDENT_AMBULATORY_CARE_PROVIDER_SITE_OTHER): Payer: 59 | Admitting: Student

## 2016-04-24 ENCOUNTER — Encounter: Payer: Self-pay | Admitting: Student

## 2016-04-24 DIAGNOSIS — E11319 Type 2 diabetes mellitus with unspecified diabetic retinopathy without macular edema: Secondary | ICD-10-CM

## 2016-04-24 DIAGNOSIS — R002 Palpitations: Secondary | ICD-10-CM | POA: Diagnosis not present

## 2016-04-24 DIAGNOSIS — I10 Essential (primary) hypertension: Secondary | ICD-10-CM | POA: Diagnosis not present

## 2016-04-24 NOTE — Patient Instructions (Signed)
Follow up with PCP in 2 months for DM check If you have further questions or concerns, call the office at [phone]

## 2016-04-24 NOTE — Progress Notes (Signed)
   Subjective:    Patient ID: [name], female    DOB: 1960-06-07, 56 y.o.   MRN: [id number]   CC: heart beating fast  HPI: 56 y/o F presents for fast heart rate occasionally  Fast heart rate - she has noted occasional episodes of her heart beating fast - she is concerned that the increase in her metformin may be causing this - she denies chest pain, SOB, lightheadedness or dizziness or headache associated - each episode lasts for approximately 1 second - she last had this sensation 2 weeks ago - these are not associated with any certain activity   DM - metformin increased to 750 at last visit - she denies diarrhea, emesis, abdominal pain - she has had some nausea with it but not bothersome - she she takes it she does note that her CBGs are usually in the low to mid 100s, less than what they have been prior  HTN - reports compliance with coreg, lisinopril and HCTZ - denies lightheadedness, chest pain or SOB - she has had some lows to 99 systolic but no symtpoms  Smoking status reviewed   Review of Systems  Per HPI, else denies recent illness, fever,  N/V/D, weakness    Objective:  BP (!) 142/81   Pulse 80   Temp 98.4 F (36.9 C) (Oral)   Wt 205 lb (93 kg)   SpO2 99%   BMI 36.31 kg/m  Vitals and nursing note reviewed  General: NAD Cardiac: RRR,  Respiratory: CTAB, normal effort Abdomen: obese, soft, nontender, nondistended, no hepatic or splenomegaly. Bowel sounds present Skin: warm and dry, no rashes noted Neuro: alert and oriented, no focal deficits   Assessment & Plan:    Diabetes ([place]) Continue metformin 750, follow A1c at next visit  Essential hypertension Blood pressure slightly above goal - will continue current regimen and follow closely  Palpitations Has had palpitations in past and was seen by cardiology and had a negative stress test - will follow palpitations and refer back to cardiology if they persist    Connor Meacham A. Lincoln Brigham MD,  [facility] Resident PGY-3 Pager [phone]

## 2016-04-24 NOTE — Assessment & Plan Note (Signed)
Has had palpitations in past and was seen by cardiology and had a negative stress test - will follow palpitations and refer back to cardiology if they persist

## 2016-04-24 NOTE — Assessment & Plan Note (Signed)
>>  ASSESSMENT AND PLAN FOR DM (DIABETES MELLITUS) WITH COMPLICATIONS ([facility]) WRITTEN ON 04/24/2016  4:39 PM BY HANEY, ALYSSA A, MD  Continue metformin  750, follow A1c at next visit

## 2016-04-24 NOTE — Assessment & Plan Note (Signed)
Continue metformin 750, follow A1c at next visit

## 2016-04-24 NOTE — Assessment & Plan Note (Addendum)
Blood pressure slightly above goal - will continue current regimen and follow closely

## 2016-04-26 ENCOUNTER — Ambulatory Visit: Payer: 59 | Admitting: Internal Medicine

## 2016-04-28 ENCOUNTER — Other Ambulatory Visit: Payer: Self-pay

## 2016-04-28 ENCOUNTER — Encounter (HOSPITAL_COMMUNITY): Payer: Self-pay | Admitting: Emergency Medicine

## 2016-04-28 ENCOUNTER — Emergency Department (HOSPITAL_COMMUNITY)
Admission: EM | Admit: 2016-04-28 | Discharge: 2016-04-28 | Disposition: A | Discharge status: Home / Self Care | Payer: 59 | Attending: Emergency Medicine | Admitting: Emergency Medicine

## 2016-04-28 DIAGNOSIS — E119 Type 2 diabetes mellitus without complications: Secondary | ICD-10-CM | POA: Diagnosis not present

## 2016-04-28 DIAGNOSIS — R002 Palpitations: Secondary | ICD-10-CM

## 2016-04-28 DIAGNOSIS — I10 Essential (primary) hypertension: Secondary | ICD-10-CM | POA: Diagnosis not present

## 2016-04-28 DIAGNOSIS — Z79899 Other long term (current) drug therapy: Secondary | ICD-10-CM | POA: Insufficient documentation

## 2016-04-28 DIAGNOSIS — Z7984 Long term (current) use of oral hypoglycemic drugs: Secondary | ICD-10-CM | POA: Insufficient documentation

## 2016-04-28 DIAGNOSIS — Z7982 Long term (current) use of aspirin: Secondary | ICD-10-CM | POA: Diagnosis not present

## 2016-04-28 DIAGNOSIS — Z853 Personal history of malignant neoplasm of breast: Secondary | ICD-10-CM | POA: Insufficient documentation

## 2016-04-28 LAB — CBC WITH DIFFERENTIAL/PLATELET
BASOS PCT: 1 %
Basophils Absolute: 0 10*3/uL (ref 0.0–0.1)
Eosinophils Absolute: 0.1 10*3/uL (ref 0.0–0.7)
Eosinophils Relative: 1 %
HEMATOCRIT: 38.4 % (ref 36.0–46.0)
Hemoglobin: 12.1 g/dL (ref 12.0–15.0)
LYMPHS PCT: 36 %
Lymphs Abs: 2.3 10*3/uL (ref 0.7–4.0)
MCH: 24.5 pg — ABNORMAL LOW (ref 26.0–34.0)
MCHC: 31.5 g/dL (ref 30.0–36.0)
MCV: 77.7 fL — AB (ref 78.0–100.0)
Monocytes Absolute: 0.4 10*3/uL (ref 0.1–1.0)
Monocytes Relative: 6 %
NEUTROS ABS: 3.6 10*3/uL (ref 1.7–7.7)
Neutrophils Relative %: 57 %
Platelets: 90 10*3/uL — ABNORMAL LOW (ref 150–400)
RBC: 4.94 MIL/uL (ref 3.87–5.11)
RDW: 14.5 % (ref 11.5–15.5)
WBC: 6.3 10*3/uL (ref 4.0–10.5)

## 2016-04-28 LAB — TSH: TSH: 0.846 u[IU]/mL (ref 0.350–4.500)

## 2016-04-28 LAB — BASIC METABOLIC PANEL
ANION GAP: 10 (ref 5–15)
BUN: 16 mg/dL (ref 6–20)
CALCIUM: 9.3 mg/dL (ref 8.9–10.3)
CO2: 24 mmol/L (ref 22–32)
Chloride: 105 mmol/L (ref 101–111)
Creatinine, Ser: 1.03 mg/dL — ABNORMAL HIGH (ref 0.44–1.00)
GFR calc non Af Amer: 60 mL/min — ABNORMAL LOW (ref >60)
GLUCOSE: 144 mg/dL — AB (ref 65–99)
POTASSIUM: 4.4 mmol/L (ref 3.5–5.1)
Sodium: 139 mmol/L (ref 135–145)

## 2016-04-28 NOTE — ED Notes (Signed)
Pt is in stable condition upon d/c and ambulates from ED. 

## 2016-04-28 NOTE — ED Provider Notes (Signed)
[facility] DEPT Provider Note   CSN: [id number] Arrival date & time: 04/28/16  1038     History   Chief Complaint Chief Complaint  Patient presents with  . Palpitations    HPI [name] is a 56 y.o. female with a PMHx of palpitations, HTN, DM2, HLD, and thrombocytopenia, who presents to the ED with complaints of intermittent palpitations that have been ongoing for 2 weeks. Patient reports that she previously had issues with palpitations in the past, was seen by cardiologist and underwent a stress test which was unremarkable in March 2017. She reports that 2 weeks ago she was started on lisinopril and HCTZ, and ever since then she has noticed intermittent palpitations that she describes as a skipped beat, but denies any fast or irregular heartbeats. She reports that they last approximately one second, usually occur in the mornings, and has noticed that it worsens when she drinks coffee. She is not tried anything specific for her symptoms. She does admit to quite a bit of caffeine intake both coffees and sodas, and states that she is trying to cut back. She is a nonsmoker. She denies any lightheadedness, diaphoresis, fevers, chills, CP, SOB, leg swelling, cough, abd pain, N/V/D/C, hematuria, dysuria, myalgias, arthralgias, numbness, tingling, focal weakness, or any other complaints at this time.    The history is provided by the patient and medical records. No language interpreter was used.  Palpitations   This is a recurrent problem. The current episode started more than 1 week ago. The problem occurs daily. The problem has not changed since onset.The problem is associated with caffeine (and new change in medication). On average, each episode lasts 1 second. Pertinent negatives include no diaphoresis, no fever, no numbness, no chest pain, no irregular heartbeat, no near-syncope, no abdominal pain, no nausea, no vomiting, no lower extremity edema, no weakness, no cough and no shortness of  breath. She has tried nothing for the symptoms. The treatment provided no relief. Risk factors include diabetes mellitus, obesity and dyslipidemia.    Past Medical History:  Diagnosis Date  . Arthritis   . Breast CA ([place])    breast CA- right breast  . Diabetes mellitus   . Heel spur    bilateral  . Hyperlipidemia   . Hypertension   . Thrombocytopenia ([place])   . Thyroid disease    parathyroid removed    Patient Active Problem List   Diagnosis Date Noted  . Arthritis of both hands 04/03/2016  . Obesity 08/30/2015  . Right anterior knee pain 04/18/2015  . Palpitations 03/10/2015  . 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 Cataracts, bilateral 09/13/2014  . Glaucoma 09/13/2014  . Mitral regurgitation 02/18/2014  . Lateral pain of right hip 01/07/2014  . 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 Transaminitis 11/13/2012  . Abdominal spasms 05/01/2012  . Personal history of colonic polyps 04/17/2012  . Bilateral foot pain 02/21/2012  . Fatigue 11/12/2011  . Health care maintenance 11/12/2011  . Plantar fasciitis 06/12/2011  . Hyperlipidemia 04/04/2011  . Diabetes ([place]) 06/07/2010  . Cancer of central portion of right female breast ([place]) 10/04/2009  . Idiopathic thrombocytopenic purpura (ITP) 06/22/2009  . Essential hypertension 06/22/2009  . HYPERPARATHYROIDISM, HX OF 06/22/2009  . Avitaminosis D 09/01/2008    Past Surgical History:  Procedure Laterality Date  . ABDOMINAL HYSTERECTOMY     1996 per pt  . BREAST SURGERY  09/12/10   excision of skin mass - right breast, had another surgery to remove  more Cancer cells  . INNER EAR SURGERY  06/01/13  . PARATHYROIDECTOMY     partial?    OB History    No data available       Home Medications    Prior to Admission medications   Medication Sig Start Date End Date Taking? Authorizing Provider  aspirin EC 81 MG tablet Take 1 tablet (81 mg total) by mouth daily. Patient not taking: Reported on 04/03/2016 11/17/15  11/16/16  Veatrice Bourbon, MD  Blood Glucose Monitoring Suppl (ONE TOUCH ULTRA 2) w/Device KIT USE   TO CHECK GLUCOSE UP TO THREE TIMES DAILY AS NEEDED 10/11/15   Elberta Leatherwood, MD  carvedilol (COREG) 25 MG tablet Take 1 tablet (25 mg total) by mouth 2 (two) times daily with a meal. 04/03/16   Alyssa A Haney, MD  glucose blood (ONE TOUCH ULTRA TEST) test strip Check blood sugar once daily 10/27/15   Veatrice Bourbon, MD  hydrochlorothiazide (MICROZIDE) 12.5 MG capsule Take 1 capsule (12.5 mg total) by mouth daily. 04/03/16   Veatrice Bourbon, MD  Lancets (ONETOUCH ULTRASOFT) lancets Check daily as needed 07/19/15   Olam Idler, MD  lisinopril (PRINIVIL,ZESTRIL) 40 MG tablet Take 1 tablet (40 mg total) by mouth daily. 04/03/16   Veatrice Bourbon, MD  metFORMIN (GLUCOPHAGE XR) 750 MG 24 hr tablet Take 1 tablet (750 mg total) by mouth daily with breakfast. 04/03/16   Veatrice Bourbon, MD  metFORMIN (GLUCOPHAGE-XR) 500 MG 24 hr tablet TAKE ONE TABLET BY MOUTH ONCE DAILY WITH BREAKFAST 04/03/16   Veatrice Bourbon, MD  pravastatin (PRAVACHOL) 40 MG tablet Take 1 tablet (40 mg total) by mouth every evening. 04/03/16   Veatrice Bourbon, MD  tamoxifen (NOLVADEX) 20 MG tablet Take 1 tablet (20 mg total) by mouth daily. 11/24/15   Nicholas Lose, MD  traMADol (ULTRAM) 50 MG tablet Take 1 tablet (50 mg total) by mouth 2 (two) times daily as needed. 04/03/16   Veatrice Bourbon, MD    Family History Family History  Problem Relation Age of Onset  . CAD Mother 75    CABG  . Stroke Father   . Heart attack Brother 64  . Colon cancer Neg Hx   . Esophageal cancer Neg Hx   . Rectal cancer Neg Hx   . Stomach cancer Neg Hx     Social History Social History  Substance Use Topics  . Smoking status: Never Smoker  . Smokeless tobacco: Never Used  . Alcohol use No     Allergies   Patient has no known allergies.   Review of Systems Review of Systems  Constitutional: Negative for chills, diaphoresis and fever.  Respiratory:  Negative for cough and shortness of breath.   Cardiovascular: Positive for palpitations. Negative for chest pain, leg swelling and near-syncope.  Gastrointestinal: Negative for abdominal pain, constipation, diarrhea, nausea and vomiting.  Genitourinary: Negative for dysuria and hematuria.  Musculoskeletal: Negative for arthralgias and myalgias.  Skin: Negative for color change.  Allergic/Immunologic: Positive for immunocompromised state (DM2).  Neurological: Negative for weakness, light-headedness and numbness.  Psychiatric/Behavioral: Negative for confusion.   10 Systems reviewed and are negative for acute change except as noted in the HPI.   Physical Exam Updated Vital Signs BP (!) 126/43 (BP Location: Left Arm)   Pulse 78   Temp 98.1 F (36.7 C) (Oral)   Resp 20   SpO2 100%   Physical Exam  Constitutional: She is oriented to person,  place, and time. Vital signs are normal. She appears well-developed and well-nourished.  Non-toxic appearance. No distress.  Afebrile, nontoxic, NAD  HENT:  Head: Normocephalic and atraumatic.  Mouth/Throat: Oropharynx is clear and moist and mucous membranes are normal.  Eyes: Conjunctivae and EOM are normal. Right eye exhibits no discharge. Left eye exhibits no discharge.  Neck: Normal range of motion. Neck supple.  Cardiovascular: Normal rate, regular rhythm, normal heart sounds and intact distal pulses.  Exam reveals no gallop and no friction rub.   No murmur heard. RRR, nl s1/s2, no m/r/g, distal pulses intact, no pedal edema   Pulmonary/Chest: Effort normal and breath sounds normal. No respiratory distress. She has no decreased breath sounds. She has no wheezes. She has no rhonchi. She has no rales.  Abdominal: Soft. Normal appearance and bowel sounds are normal. She exhibits no distension. There is no tenderness. There is no rigidity, no rebound, no guarding, no CVA tenderness, no tenderness at McBurney's point and negative Murphy's sign.    Musculoskeletal: Normal range of motion.  MAE x4 Strength and sensation grossly intact in all extremities Distal pulses intact Gait steady No pedal edema, neg homan's bilaterally   Neurological: She is alert and oriented to person, place, and time. She has normal strength. No sensory deficit.  Skin: Skin is warm, dry and intact. No rash noted.  Psychiatric: She has a normal mood and affect.  Nursing note and vitals reviewed.    ED Treatments / Results  Labs (all labs ordered are listed, but only abnormal results are displayed) Labs Reviewed  CBC WITH DIFFERENTIAL/PLATELET - Abnormal; Notable for the following:       Result Value   MCV 77.7 (*)    MCH 24.5 (*)    Platelets 90 (*)    All other components within normal limits  BASIC METABOLIC PANEL - Abnormal; Notable for the following:    Glucose, Bld 144 (*)    Creatinine, Ser 1.03 (*)    GFR calc non Af Amer 60 (*)    All other components within normal limits  TSH    EKG  EKG Interpretation  Date/Time:  'Sunday April 28 2016 10:46:28 EDT Ventricular Rate:  81 PR Interval:  182 QRS Duration: 82 QT Interval:  360 QTC Calculation: 418 R Axis:   77 Text Interpretation:  Normal sinus rhythm Normal ECG No significant change since last tracing Confirmed by ALLEN  MD, ANTHONY ([id number]) on 04/28/2016 11:22:31 AM Also confirmed by ALLEN  MD, ANTHONY ([id number]), editor Scales-Price, Shannon ([id number])  on 04/28/2016 11:49:47 AM       Radiology No results found.   Stress Test 05/05/15: Study Highlights   There was no ST segment deviation noted during stress.   Normal stress test. No ischemia. Normal BP response to exertion. Decreased exercise capacity     Procedures Procedures (including critical care time)  Medications Ordered in ED Medications - No data to display   Initial Impression / Assessment and Plan / ED Course  I have reviewed the triage vital signs and the nursing notes.  Pertinent labs & imaging results that  were available during my care of the patient were reviewed by me and considered in my medical decision making (see chart for details).     56'  y.o. female here with intermittent palpitations x2 wks since starting lisinopril and HCTZ. Previously had this issue, saw cardiology who did a stress test 05/05/15 which was negative. Reports it feels like a skipped beat, but not fast  or irregular. On exam, no tachycardia, reg rhythm, no hypoxia, denies SOB, no LE swelling, pulses intact, NAD, VSS. Will get EKG, basic labs to check for any derangements, and TSH. As long as these are unremarkable, will likely refer her back to her PCP or cardiology for holter monitoring. Will reassess shortly  2:38 PM Pt feeling well. EKG unremarkable, unchanged, and without acute ischemic findings. CBC w/diff with stable thrombocytopenia at baseline, otherwise unremarkable. BMP without any significant results. TSH unremarkable. Discussed that palpitations can be from many causes, but caffeine intake will likely be attributable. Discussed limiting caffeine, staying hydrated, continuing her usual meds, and f/up in 1 wk with PCP and/or cardiology for holter monitor study and for recheck and ongoing management of her symptoms. I explained the diagnosis and have given explicit precautions to return to the ER including for any other new or worsening symptoms. The patient understands and accepts the medical plan as it's been dictated and I have answered their questions. Discharge instructions concerning home care and prescriptions have been given. The patient is STABLE and is discharged to home in good condition.   Final Clinical Impressions(s) / ED Diagnoses   Final diagnoses:  Palpitations    New Prescriptions New Prescriptions   No medications on file     [address] Javonnie Illescas, PA-C 04/28/16 1438    Lacretia Leigh, MD 04/30/16 1206

## 2016-04-28 NOTE — ED Triage Notes (Signed)
Has felt her heart racing x 2 weeks , saw her dr and he  Changed her meds,  Saw her dr again, today she did not feel well so she came here

## 2016-04-28 NOTE — Discharge Instructions (Signed)
Palpitations can be from a lot of different causes. Your labs and EKG are reassuring today. Stay well hydrated, get plenty of rest, and avoid caffeine intake. Follow up with your regular doctor and your cardiologist in 1 week for recheck of symptoms and ongoing management of your palpitations, and possibly to have a holter monitor study done. Return to the ER for emergent changes or worsening symptoms.

## 2016-04-29 ENCOUNTER — Ambulatory Visit
Admission: RE | Admit: 2016-04-29 | Discharge: 2016-04-29 | Disposition: A | Discharge status: Home / Self Care | Payer: 59 | Source: Ambulatory Visit | Attending: Hematology and Oncology | Admitting: Hematology and Oncology

## 2016-04-29 DIAGNOSIS — Z1231 Encounter for screening mammogram for malignant neoplasm of breast: Secondary | ICD-10-CM | POA: Diagnosis not present

## 2016-04-29 DIAGNOSIS — Z9011 Acquired absence of right breast and nipple: Secondary | ICD-10-CM

## 2016-04-30 ENCOUNTER — Ambulatory Visit: Payer: 59 | Admitting: Obstetrics and Gynecology

## 2016-05-03 ENCOUNTER — Other Ambulatory Visit: Payer: Self-pay | Admitting: Cardiology

## 2016-05-08 ENCOUNTER — Encounter: Payer: Self-pay | Admitting: Student

## 2016-05-08 ENCOUNTER — Ambulatory Visit (INDEPENDENT_AMBULATORY_CARE_PROVIDER_SITE_OTHER): Payer: 59 | Admitting: Student

## 2016-05-08 DIAGNOSIS — R002 Palpitations: Secondary | ICD-10-CM | POA: Diagnosis not present

## 2016-05-08 DIAGNOSIS — I10 Essential (primary) hypertension: Secondary | ICD-10-CM | POA: Diagnosis not present

## 2016-05-08 NOTE — Progress Notes (Signed)
   Subjective:    Patient ID: [name], female    DOB: Feb 19, 1960, 56 y.o.   MRN: [id number]   CC: heart palpitations  HPI: 56 y/o F with HTN presents for heart palpitations  Heart palpitations - has noted heart palpitations that last for " about 1 second"  - she does not experience these every day - she notes them most in the AM when she is waking up - she denies chest pain, SOB, headache, lightheadedness or dizziness associated  HTN - reports compliance with lisinopril, spirinolactone and coreg - no chest pain, SOB, headache  Smoking status reviewed  Review of Systems  Per HPI  Objective:  BP 118/82   Pulse 79   Temp 98.6 F (37 C) (Oral)   Wt 206 lb (93.4 kg)   SpO2 97%   BMI 36.49 kg/m  Vitals and nursing note reviewed  General: NAD Cardiac: RRR,  Respiratory: CTAB, normal effort Skin: warm and dry, no rashes noted Neuro: alert and oriented, no focal deficits   Assessment & Plan:    Palpitations Continues to report palpitation. Had been seen by cardiology and had a negative stress test in 04/2015 - will refer again to cardiology for work up for bother some palpitations  Essential hypertension Stable, BP at goal    Chaston Bradburn A. Lincoln Brigham MD, [facility] Resident PGY-3 Pager [phone]

## 2016-05-08 NOTE — Assessment & Plan Note (Signed)
Stable, BP at goal

## 2016-05-08 NOTE — Patient Instructions (Signed)
Follow up with Cardiology for heart palpitations Follow up with PCP in 1 month If you have questions or concerns, call the office at [phone]

## 2016-05-08 NOTE — Assessment & Plan Note (Signed)
Continues to report palpitation. Had been seen by cardiology and had a negative stress test in 04/2015 - will refer again to cardiology for work up for bother some palpitations

## 2016-05-10 ENCOUNTER — Encounter: Payer: Self-pay | Admitting: Cardiology

## 2016-05-13 ENCOUNTER — Ambulatory Visit (INDEPENDENT_AMBULATORY_CARE_PROVIDER_SITE_OTHER): Payer: 59 | Admitting: Cardiology

## 2016-05-13 ENCOUNTER — Encounter: Payer: Self-pay | Admitting: Cardiology

## 2016-05-13 VITALS — BP 140/79 | HR 79 | Ht 63.0 in | Wt 206.2 lb

## 2016-05-13 DIAGNOSIS — I10 Essential (primary) hypertension: Secondary | ICD-10-CM

## 2016-05-13 DIAGNOSIS — R002 Palpitations: Secondary | ICD-10-CM

## 2016-05-13 LAB — BASIC METABOLIC PANEL
BUN: 14 mg/dL (ref 7–25)
CO2: 29 mmol/L (ref 20–31)
Calcium: 9.6 mg/dL (ref 8.6–10.4)
Chloride: 105 mmol/L (ref 98–110)
Creat: 0.93 mg/dL (ref 0.50–1.05)
GLUCOSE: 118 mg/dL — AB (ref 65–99)
POTASSIUM: 4.5 mmol/L (ref 3.5–5.3)
SODIUM: 139 mmol/L (ref 135–146)

## 2016-05-13 NOTE — Patient Instructions (Signed)
Lab ( bmet ) today      Follow up as needed

## 2016-05-13 NOTE — Progress Notes (Signed)
05/13/2016 [name]   1960/05/07  [id number]  Primary Physician Marina Goodell, MD Primary Cardiologist: Dr. Percival Spanish    Reason for Visit/CC: palpitations  HPI:  [name] is a 56 y.o. female who is being seen today for palpitations, which she has a h/o. She is followed by Dr. Percival Spanish. Her w/u in the past for her palpitations was benign. 2D echo in 2016 showed normal LVEF at 50-55%. She also had an exercise tolerance stress test that was normal. She was last seen by Dr. Percival Spanish 04/17/15 and was noted to be stable w/o symptoms. He outlined in his note for her to f/u with him as needed. She also has a h/o HTN, HLD and T2DM which are all managed by her PCP.   She was recently seen by her PCP 05/08/16 for routine f/u and she complained of recurrent palpitations. Also seen in the ED 3/26 for the same problem. W/u was unremarkable. EKG showed NSR. TSH and K both WNL. She was advised to f/u in our office for further w/u and recommendations.   She notes "flutters" in her chest, that occur mainly in the mornings. Happens 2- 3 times, max. No sustained palpitations. She denies any other associated symptoms. No dizziness, fatigue, syncope/ near syncope, CP or dyspnea. She admits to moderate caffeine consumption + stress/anxiety. She is on a BB, Coreg 25 mg BID. BP is 140/79. HR is 79 bpm. She has not yet taken her morning meds. She is currently asymptomatic.     Current Meds  Medication Sig  . aspirin EC 81 MG tablet Take 1 tablet (81 mg total) by mouth daily.  . Blood Glucose Monitoring Suppl (ONE TOUCH ULTRA 2) w/Device KIT USE   TO CHECK GLUCOSE UP TO THREE TIMES DAILY AS NEEDED  . carvedilol (COREG) 25 MG tablet Take 1 tablet (25 mg total) by mouth 2 (two) times daily with a meal.  . glucose blood (ONE TOUCH ULTRA TEST) test strip Check blood sugar once daily  . hydrochlorothiazide (MICROZIDE) 12.5 MG capsule Take 1 capsule (12.5 mg total) by mouth daily.  . Lancets (ONETOUCH ULTRASOFT)  lancets Check daily as needed  . lisinopril (PRINIVIL,ZESTRIL) 40 MG tablet Take 1 tablet (40 mg total) by mouth daily.  . metFORMIN (GLUCOPHAGE XR) 750 MG 24 hr tablet Take 1 tablet (750 mg total) by mouth daily with breakfast.  . metFORMIN (GLUCOPHAGE-XR) 500 MG 24 hr tablet TAKE ONE TABLET BY MOUTH ONCE DAILY WITH BREAKFAST  . pravastatin (PRAVACHOL) 40 MG tablet Take 1 tablet (40 mg total) by mouth every evening.  Marland Kitchen spironolactone (ALDACTONE) 25 MG tablet Take 25 mg by mouth daily.  Marland Kitchen spironolactone (ALDACTONE) 25 MG tablet TAKE ONE TABLET BY MOUTH ONCE DAILY  . tamoxifen (NOLVADEX) 20 MG tablet Take 1 tablet (20 mg total) by mouth daily.  . traMADol (ULTRAM) 50 MG tablet Take 1 tablet (50 mg total) by mouth 2 (two) times daily as needed.   No Known Allergies Past Medical History:  Diagnosis Date  . Arthritis   . Breast CA ([place])    breast CA- right breast  . Diabetes mellitus   . Heel spur    bilateral  . Hyperlipidemia   . Hypertension   . Thrombocytopenia ([place])   . Thyroid disease    parathyroid removed   Family History  Problem Relation Age of Onset  . CAD Mother 24    CABG  . Stroke Father   . Heart attack Brother 51  . Colon cancer  Neg Hx   . Esophageal cancer Neg Hx   . Rectal cancer Neg Hx   . Stomach cancer Neg Hx    Past Surgical History:  Procedure Laterality Date  . ABDOMINAL HYSTERECTOMY     1996 per pt  . BREAST BIOPSY Right 06/2009  . BREAST SURGERY  09/12/10   excision of skin mass - right breast, had another surgery to remove more Cancer cells  . INNER EAR SURGERY  06/01/13  . MASTECTOMY Right 07/21/2009  . PARATHYROIDECTOMY     partial?  . REDUCTION MAMMAPLASTY Left 2013   Social History   Social History  . Marital status: Married    Spouse name: N/A  . Number of children: 2  . Years of education: N/A   Occupational History  . Not on file.   Social History Main Topics  . Smoking status: Never Smoker  . Smokeless tobacco: Never Used  .  Alcohol use No  . Drug use: No  . Sexual activity: Not Currently    Birth control/ protection: Surgical   Other Topics Concern  . Not on file   Social History Narrative   Pt lives with husband and in [place].   Does not work or go to school.   Walks daily    Exercises with walking in yard.   Drinks 2 cans sweetened soda daily.     Review of Systems: General: negative for chills, fever, night sweats or weight changes.  Cardiovascular: negative for chest pain, dyspnea on exertion, edema, orthopnea, palpitations, paroxysmal nocturnal dyspnea or shortness of breath Dermatological: negative for rash Respiratory: negative for cough or wheezing Urologic: negative for hematuria Abdominal: negative for nausea, vomiting, diarrhea, bright red blood per rectum, melena, or hematemesis Neurologic: negative for visual changes, syncope, or dizziness All other systems reviewed and are otherwise negative except as noted above.   Physical Exam:  Blood pressure 140/79, pulse 79, height '5\' 3"'  (1.6 m), weight 206 lb 3.2 oz (93.5 kg).  General appearance: alert, cooperative and no distress Neck: no carotid bruit and no JVD Lungs: clear to auscultation bilaterally Heart: regular rate and rhythm, S1, S2 normal, no murmur, click, rub or gallop Extremities: extremities normal, atraumatic, no cyanosis or edema Pulses: 2+ and symmetric Skin: Skin color, texture, turgor normal. No rashes or lesions Neurologic: Grossly normal  EKG Not performed, ED EKG 3/26 personally reviewed >> NSR   ASSESSMENT AND PLAN:   1. Palpitations: likely either PACs or PVCs.  She notes "flutters" in her chest, that occur mainly in the mornings. Happens 2- 3 times, max. No sustained palpitations. She denies any other associated symptoms. No dizziness, fatigue, syncope/ near syncope, CP or dyspnea. She admits to moderate caffeine consumption + stress/anxiety. She is on a BB, Coreg 25 mg BID. 2D echo and stress test last year  were both unremarkable. I recommend that we continue her BB and that she cut back on caffeine consumption. Given her lack of sustained symptoms, will hold off on ambulatory monitor at this time. If her symptoms worsen/ increase then we can reconsider this. We will check a BMP today to ensure her K is stable (on HCTZ). Recent TSH was normal.   2. HTN: controlled on current regimen. Followed by PCP.   3. HLD: on statin therapy. Followed by PCP.   4. DM: on Metformin. Followed by PCP.    F/u as needed.   Briawna Carver Ladoris Gene, MHS [facility] 05/13/2016 9:22 AM

## 2016-06-13 ENCOUNTER — Ambulatory Visit (INDEPENDENT_AMBULATORY_CARE_PROVIDER_SITE_OTHER): Payer: 59 | Admitting: Sports Medicine

## 2016-06-13 ENCOUNTER — Encounter: Payer: Self-pay | Admitting: Sports Medicine

## 2016-06-13 VITALS — BP 120/56 | Ht 63.0 in | Wt 202.0 lb

## 2016-06-13 DIAGNOSIS — M7661 Achilles tendinitis, right leg: Secondary | ICD-10-CM

## 2016-06-13 NOTE — Progress Notes (Signed)
   Subjective:    Patient ID: [name], female    DOB: April 09, 1960, 56 y.o.   MRN: [id number]  HPI chief complaint: Right heel pain  Very pleasant 56 year old female comes in today complaining of 2 weeks of posterior right heel pain. 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 She describes a burning sensation along the right Achilles tendon. Constantly present with walking. Improves at rest. She has noticed swelling in the dorsum of both feet. She denies swelling elsewhere. She has not been taking oral NSAIDs. In fact, she cannot take oral anti-inflammatories. She was previously given a pair of green sports insoles for her tennis shoes for an unrelated foot problem but she states that they are quite worn out. The only shoes she has with her today are pair of flats.  Interim medical history reviewed Medications reviewed Allergies reviewed    Review of Systems    as above Objective:   Physical Exam  Obese. No acute distress. Awake alert and oriented 3.  Right ankle: Patient is diffusely tender to palpation along the Achilles tendon. No palpable defect. Reproducible pain with Achilles stretch. Right foot: Patient has diffuse edema across the dorsum of her foot. It is nontender to palpation. Not erythematous. No edema in the lower leg. Good pulses Left foot: Mild edema across the dorsum of this foot as well. Good pulses Walking with a slight limp.      Assessment & Plan:   Right heel Achilles tendinitis Bilateral pedal edema  Since the patient is limping I will immobilize her right foot and ankle in a Cam Walker for one week. She will follow-up with me next week with her tennis shoes. I would like to get her out of her Cam Walker at that time and into a new pair of green sports insoles. We will plan on adding a heel lift to those inserts.Since she cannot take oral NSAIDs I recommended she try topical Aspercreme. I've also  recommended that she ice her Achilles tendon 2-3 times daily. For her bilateral pedal edema I explained to her that this is unrelated to her Achilles tendinitis. It is present in both feet. I recommend follow-up with her primary care physician to discuss this further.

## 2016-06-17 DIAGNOSIS — C50911 Malignant neoplasm of unspecified site of right female breast: Secondary | ICD-10-CM | POA: Diagnosis not present

## 2016-06-17 DIAGNOSIS — E119 Type 2 diabetes mellitus without complications: Secondary | ICD-10-CM | POA: Diagnosis not present

## 2016-06-17 DIAGNOSIS — C50811 Malignant neoplasm of overlapping sites of right female breast: Secondary | ICD-10-CM | POA: Diagnosis not present

## 2016-06-17 DIAGNOSIS — I10 Essential (primary) hypertension: Secondary | ICD-10-CM | POA: Diagnosis not present

## 2016-06-20 ENCOUNTER — Ambulatory Visit: Payer: 59 | Admitting: Sports Medicine

## 2016-07-04 ENCOUNTER — Ambulatory Visit: Payer: 59 | Admitting: Student

## 2016-08-12 ENCOUNTER — Ambulatory Visit (INDEPENDENT_AMBULATORY_CARE_PROVIDER_SITE_OTHER): Payer: 59 | Admitting: Sports Medicine

## 2016-08-12 VITALS — BP 130/80 | Ht 63.0 in | Wt 202.0 lb

## 2016-08-12 DIAGNOSIS — M7661 Achilles tendinitis, right leg: Secondary | ICD-10-CM

## 2016-08-12 IMAGING — DX DG LUMBAR SPINE COMPLETE 4+V
5 series · 5 of 5 positions shown · non-contrast
Comparison: None.

CLINICAL DATA: Low back pain.

EXAM:
LUMBAR SPINE - COMPLETE 4+ VIEW

[l-spine ap]
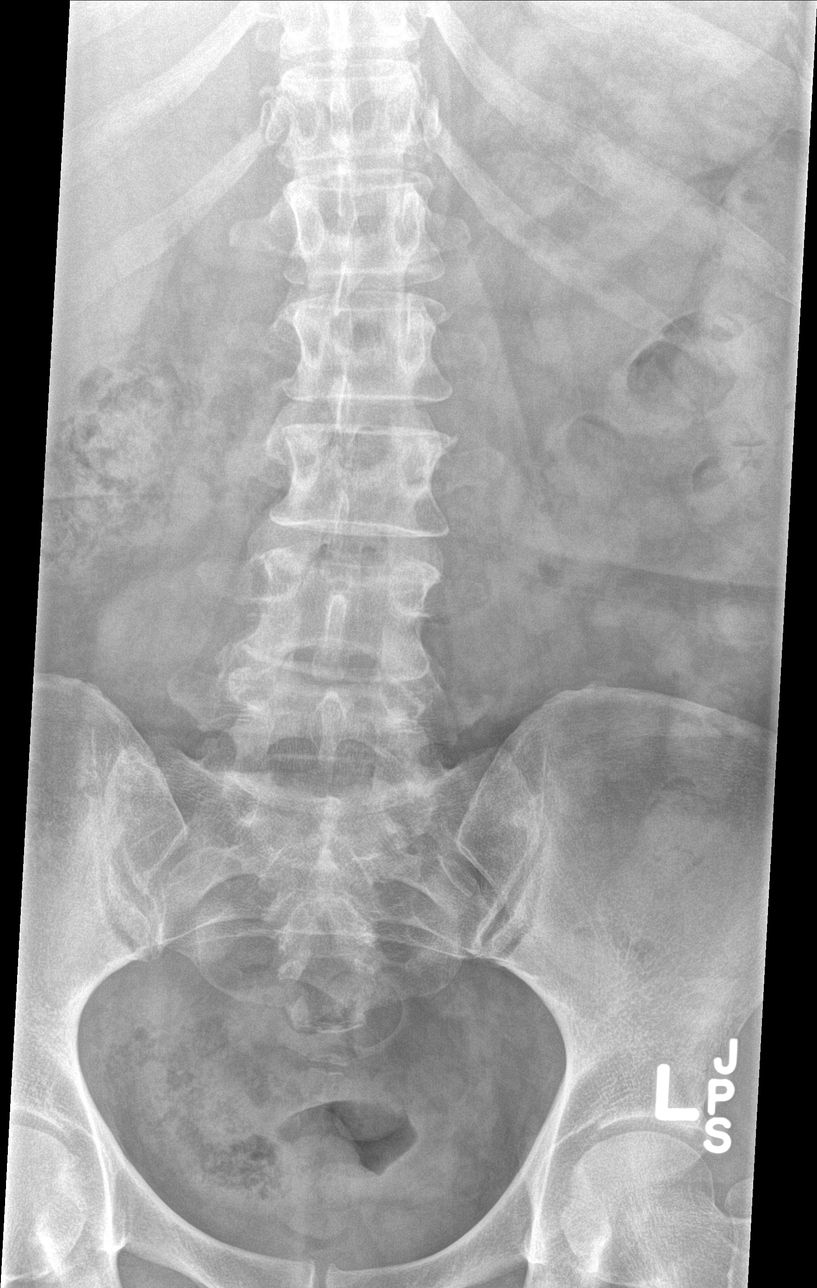

[l-spine obl (1 of 2)]
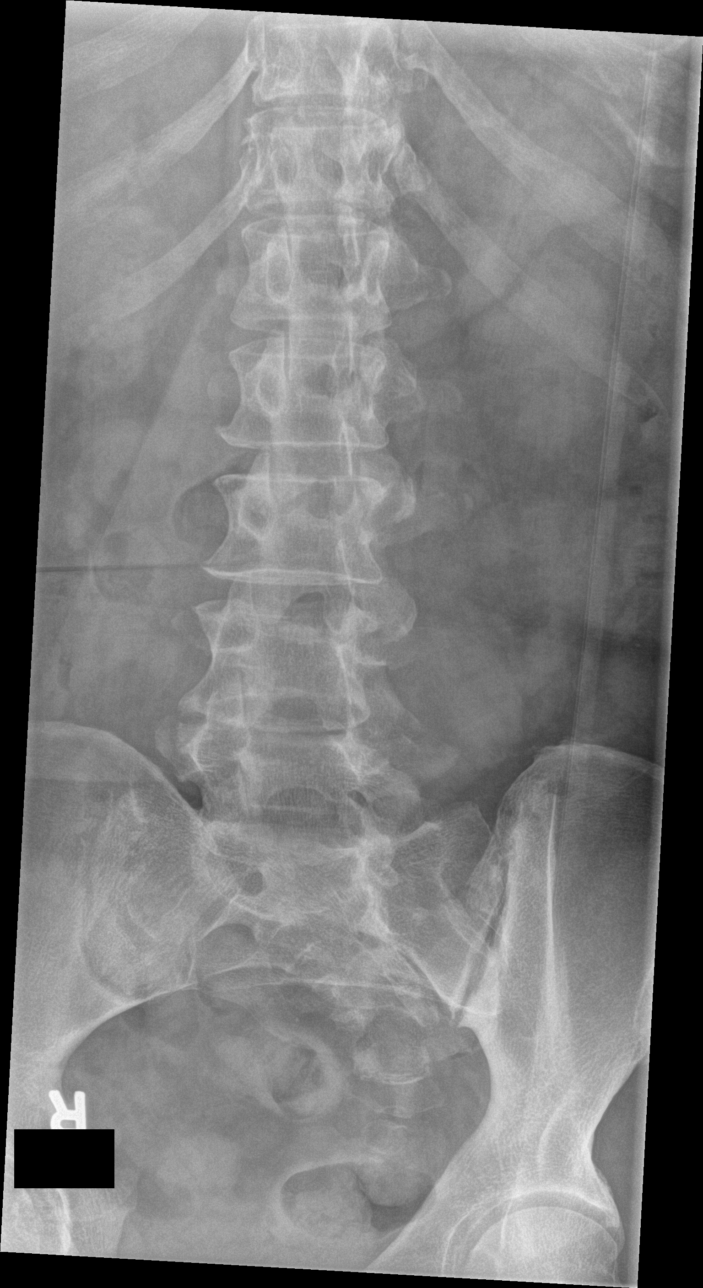

[l-spine obl (2 of 2)]
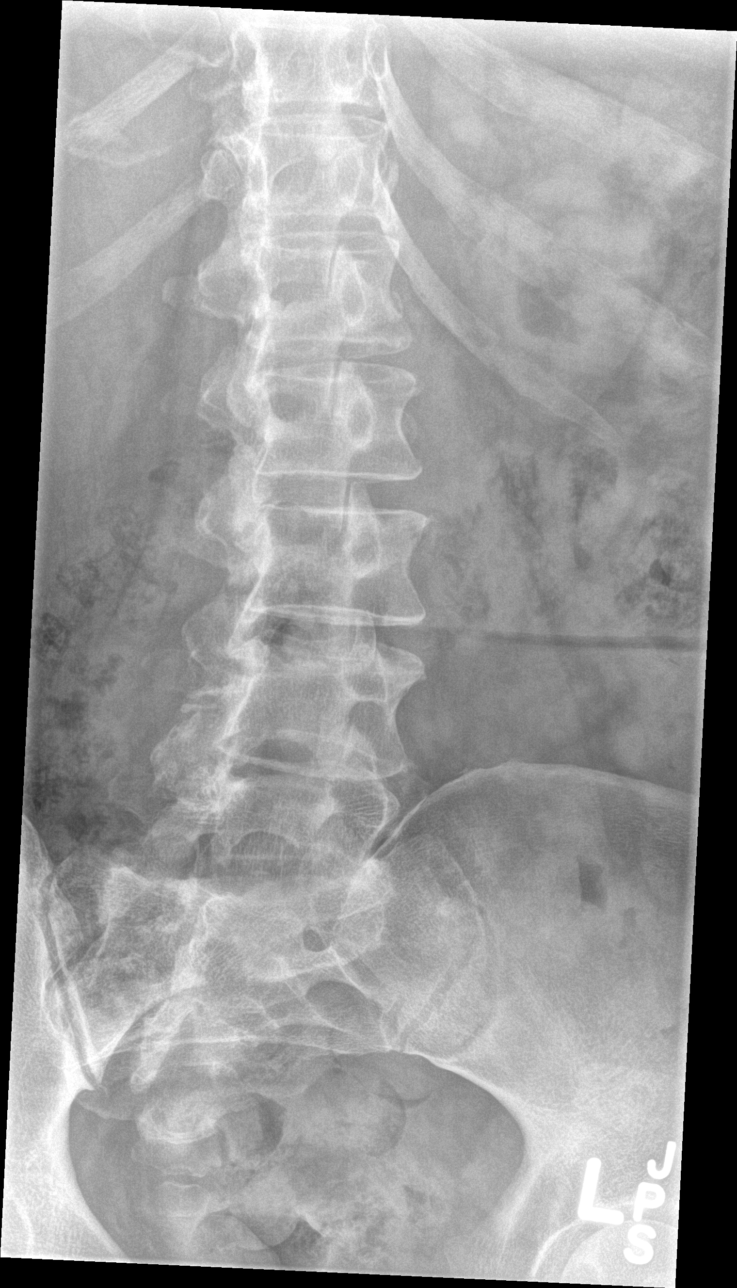

[l-spine lat]
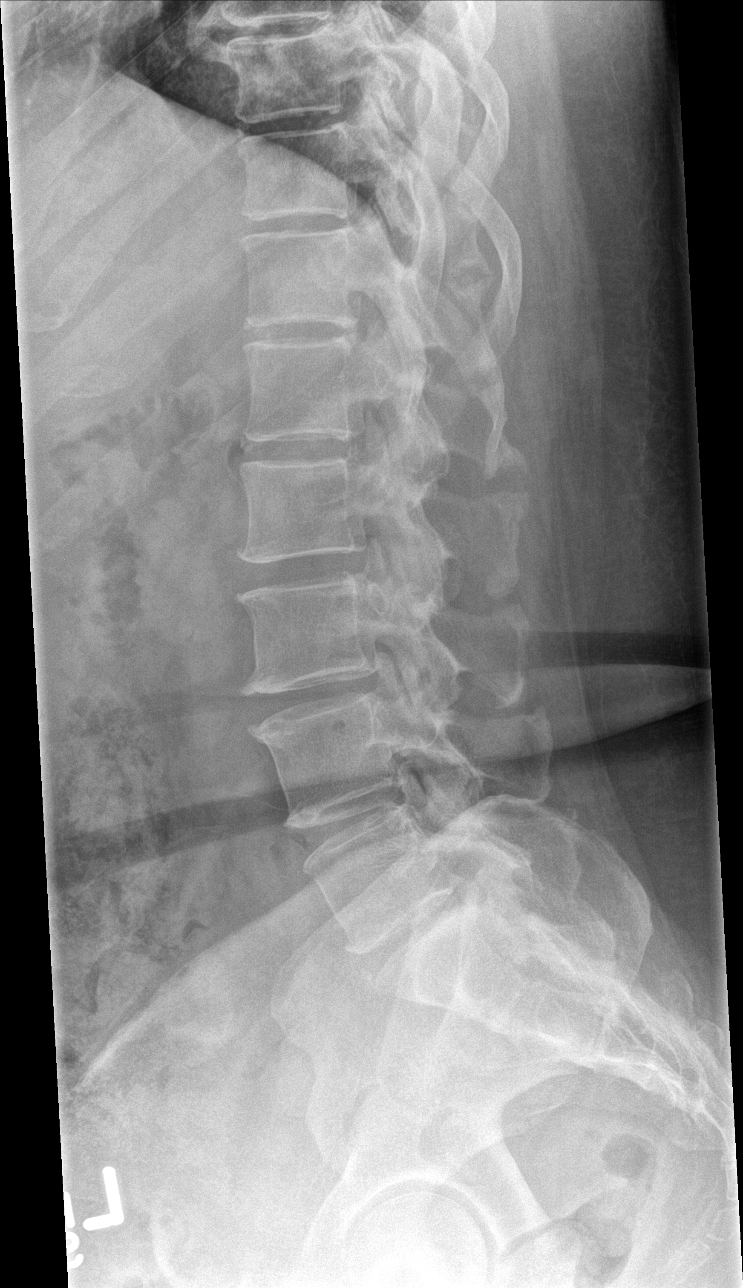

[l-spine spot]
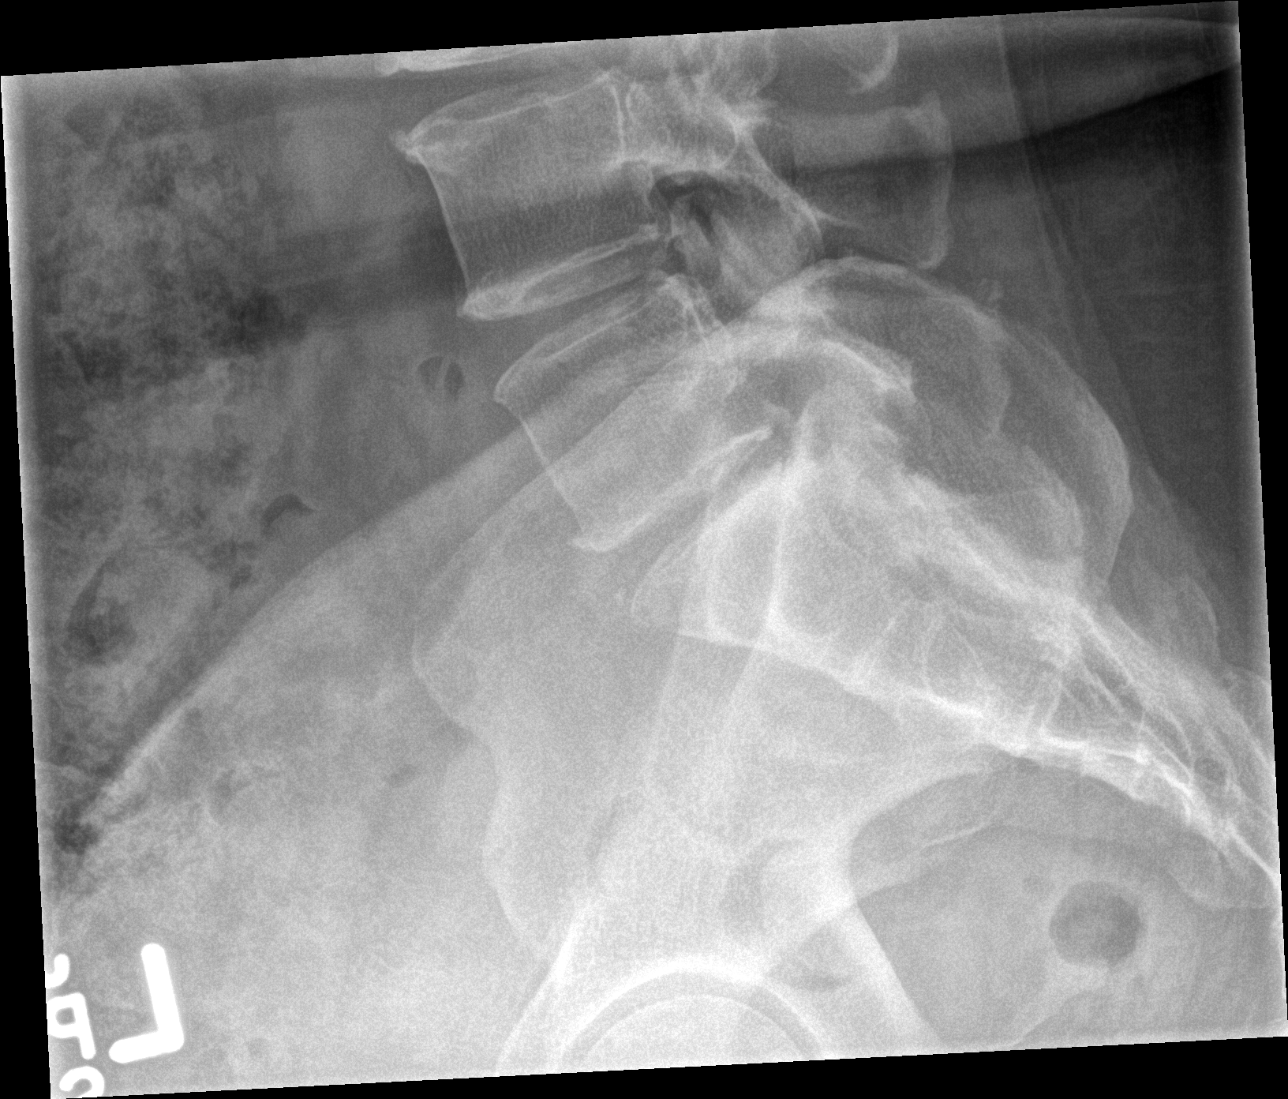

[5 of 5 positions shown; findings below may reference images not displayed]

FINDINGS: Paraspinal soft tissues are normal. No acute bony abnormality
identified. Diffuse degenerative changes lumbar spine. Pedicles are
intact.
IMPRESSION: No acute or focal abnormality.  Diffuse degenerative change.

## 2016-08-12 IMAGING — DX DG HIP W/ PELVIS BILAT
5 series · 5 of 5 positions shown · non-contrast
Comparison: None.

CLINICAL DATA: Bilateral hip pain.

EXAM:
BILATERAL HIP WITH PELVIS - 4+ VIEW

[pelvis ap]
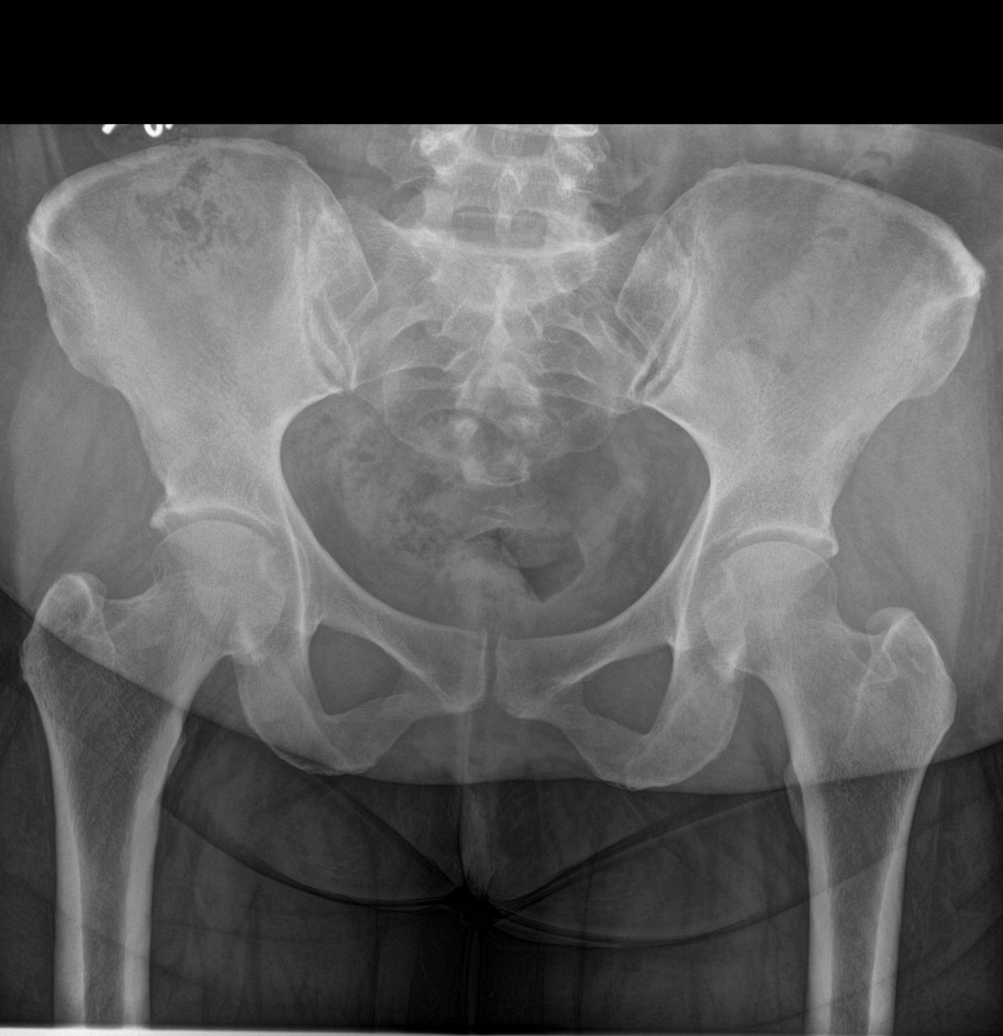

[hip ap (1 of 2)]
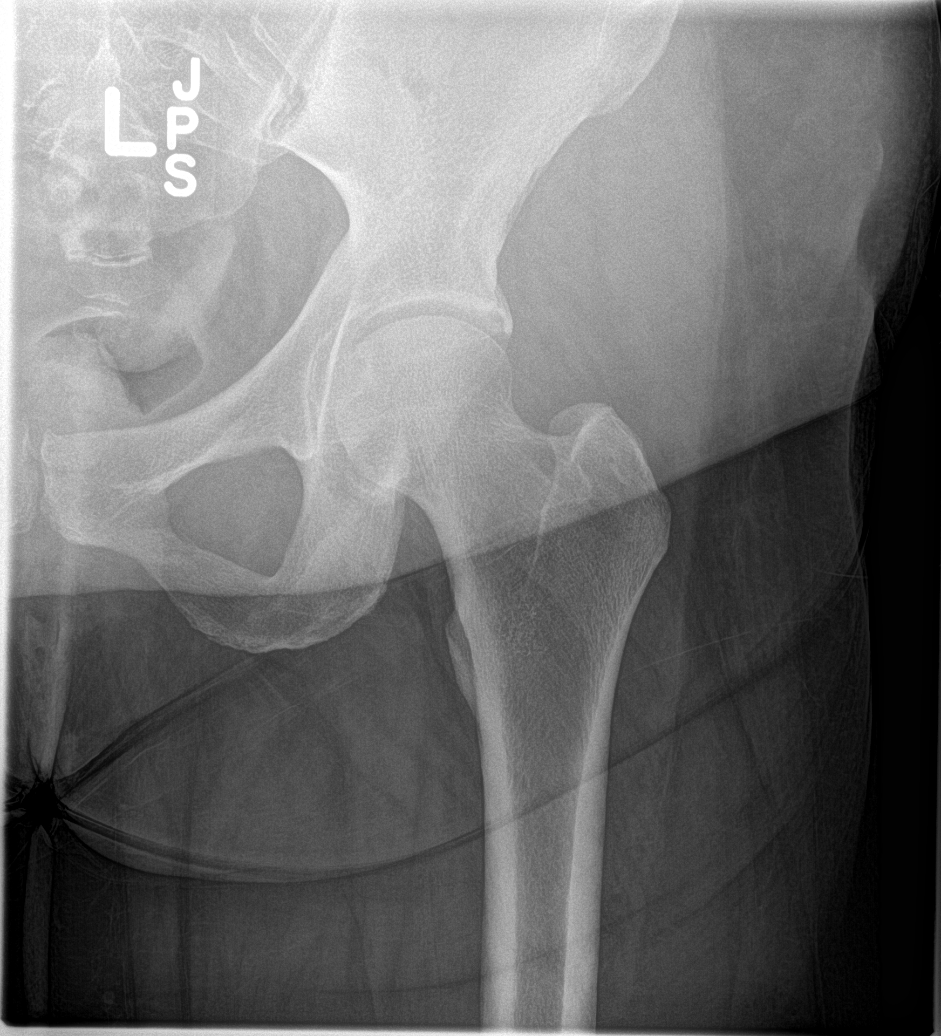

[hip ap (2 of 2)]
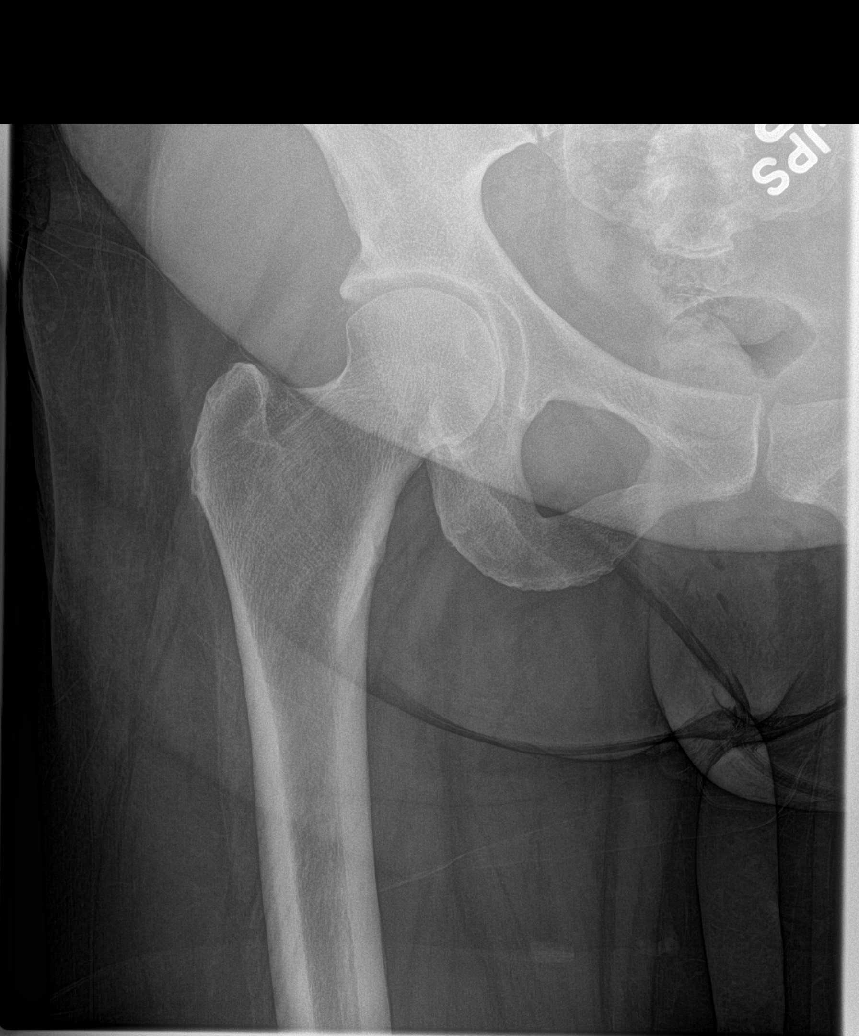

[hip lat (1 of 2)]
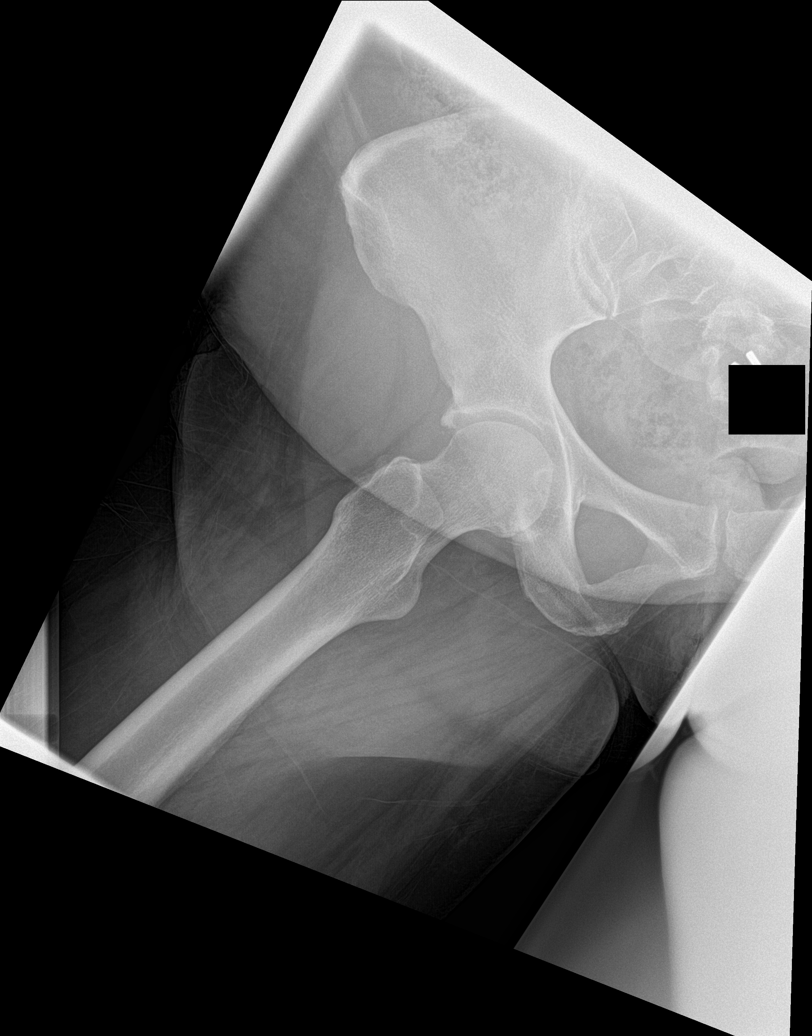

[hip lat (2 of 2)]
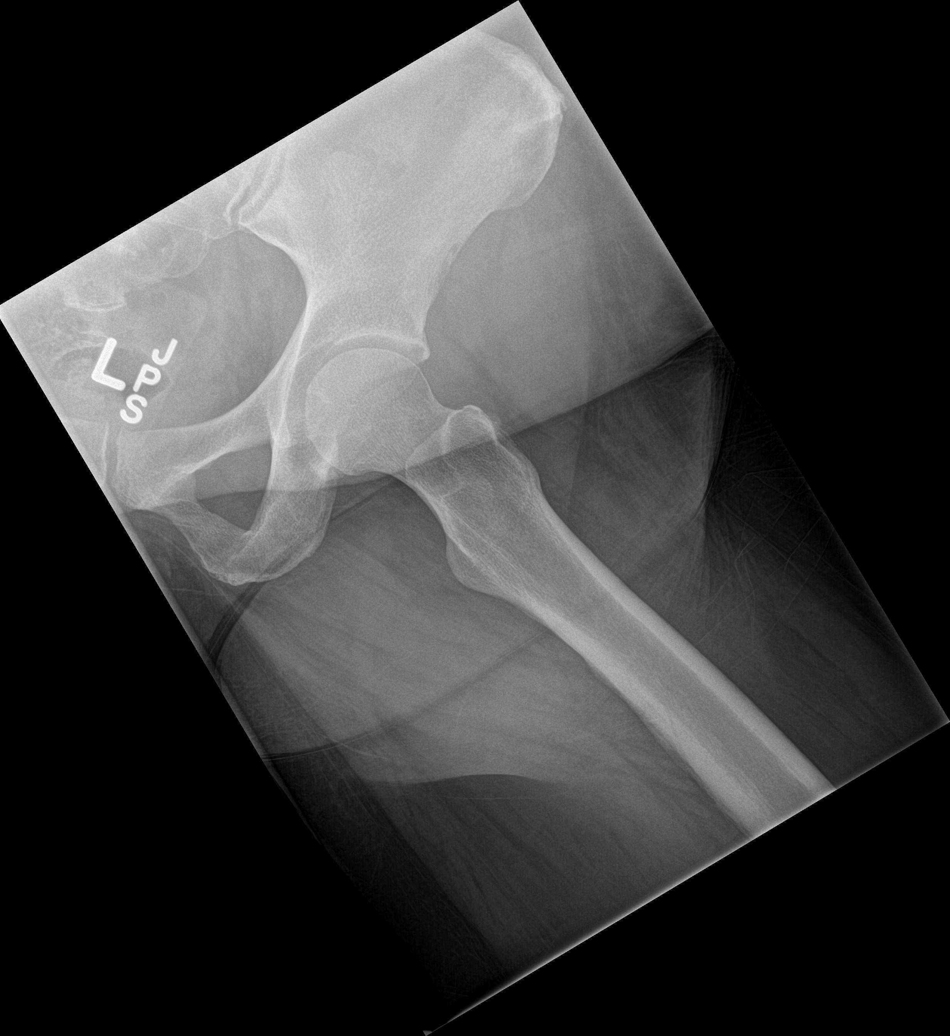

[5 of 5 positions shown; findings below may reference images not displayed]

FINDINGS: Degenerative changes lumbar spine and both hips. No acute bony
abnormality identified. No evidence of fracture.
IMPRESSION: No acute abnormality.

## 2016-08-12 NOTE — Progress Notes (Signed)
   Subjective:    Patient ID: [name], female    DOB: October 13, 1960, 56 y.o.   MRN: [id number]  HPI chief complaint: right heel pain  Patient presents today for follow up of right heel pain. Could not make prior follow up due to death in family. Has been wearing boot since last appointment in May when she has to walk long distances and reports relief with this. Has been using topical Aspercreme with relief. Has not got inserts for shoes yet, desires these today. Pain is over the achilles tendon and is worse after waking up or periods of sitting. Patient reports swelling in both feet but feels it's worse on right due to the heel pain.   Review of Systems- as above     Objective:   Physical Exam  Well nourished, well developed, in NAD  Feet: trace edema noted bilaterally. Tender to palpation over right achilles tendon. Normal ROM in dorsiflexion and plantarflexion bilaterally. Neurovascularly in tact bilaterally.     Assessment & Plan:   Right achilles tendinitis -patient fitted with green sports insoles today with scaphoid pads and 3/16 inch heel lift bilaterally  -referral for outpatient PT for achilles tendinitis made -follow up in 4 weeks for reevaluation -follow up with PCP as scheduled for bilateral LE edema

## 2016-08-19 ENCOUNTER — Ambulatory Visit: Payer: 59 | Admitting: Internal Medicine

## 2016-08-21 ENCOUNTER — Encounter: Payer: Self-pay | Admitting: Physical Therapy

## 2016-08-21 ENCOUNTER — Ambulatory Visit: Payer: 59 | Attending: Sports Medicine | Admitting: Physical Therapy

## 2016-08-21 DIAGNOSIS — R262 Difficulty in walking, not elsewhere classified: Secondary | ICD-10-CM | POA: Diagnosis not present

## 2016-08-21 DIAGNOSIS — R600 Localized edema: Secondary | ICD-10-CM | POA: Insufficient documentation

## 2016-08-21 DIAGNOSIS — M25571 Pain in right ankle and joints of right foot: Secondary | ICD-10-CM | POA: Diagnosis not present

## 2016-08-21 NOTE — Therapy (Addendum)
[place], [place], [address] Phone: [phone]   Fax:  [phone]  Physical Therapy Evaluation/Discharge Summary  Patient Details  Name: [name] MRN: [id number] Date of Birth: 1960/04/20 Referring Provider: Thurman Coyer, DO  Encounter Date: 08/21/2016      PT End of Session - 08/21/16 1419    Visit Number 1   Number of Visits 7   Date for PT Re-Evaluation 09/27/16   Authorization Type U[employer]   PT Start Time 6237   PT Stop Time 1456   PT Time Calculation (min) 41 min   Activity Tolerance Patient tolerated treatment well   Behavior During Therapy [facility] for tasks assessed/performed      Past Medical History:  Diagnosis Date  . Arthritis   . Breast CA ([place])    breast CA- right breast  . Diabetes mellitus   . Heel spur    bilateral  . Hyperlipidemia   . Hypertension   . Thrombocytopenia ([place])   . Thyroid disease    parathyroid removed    Past Surgical History:  Procedure Laterality Date  . ABDOMINAL HYSTERECTOMY     1996 per pt  . BREAST BIOPSY Right 06/2009  . BREAST SURGERY  09/12/10   excision of skin mass - right breast, had another surgery to remove more Cancer cells  . INNER EAR SURGERY  06/01/13  . MASTECTOMY Right 07/21/2009  . PARATHYROIDECTOMY     partial?  . REDUCTION MAMMAPLASTY Left 2013    There were no vitals filed for this visit.       Subjective Assessment - 08/21/16 1420    Subjective Over 6 mo has had difficulty walking. Pain when placing pressure on feet in the morning. Burning, itching on bottom of feet and into achilles throughout the day. Inserts in shoes for flat feet, they feel alright; ankle/heel pain about the same.    Patient Stated Goals volunteers in nursery at church, walk (30 miin-1hr) & exercise    Currently in Pain? Yes   Pain Score 7    Pain Location Heel   Pain Orientation Right   Pain Descriptors / Indicators Burning   Aggravating  Factors  walking, pressure into foot   Pain Relieving Factors aspercream             [facility] PT Assessment - 08/21/16 0001      Assessment   Medical Diagnosis R achilles tendonitis   Referring Provider Thurman Coyer, DO   Onset Date/Surgical Date --  6 months   Hand Dominance Right   Next MD Visit 09/09/16   Prior Therapy no     Precautions   Precaution Comments DM, high BP     Restrictions   Weight Bearing Restrictions No     Balance Screen   Has the patient fallen in the past 6 months No     [place] residence   Living Arrangements Spouse/significant other   Additional Comments no stairs at home     Prior Function   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     Cognition   Overall Cognitive Status Within Functional Limits for tasks assessed     Observation/Other Assessments   Focus on Therapeutic Outcomes (FOTO)  49% limited (goal 40%)     Observation/Other Assessments-Edema    Edema Circumferential  bilateral malleoli     Circumferential Edema   Circumferential - Right 28 mm  Circumferential - Left  42m     Sensation   Additional Comments WFL     Posture/Postural Control   Posture Comments bilateral flat foot;      ROM / Strength   AROM / PROM / Strength AROM;PROM     AROM   AROM Assessment Site Ankle   Right/Left Ankle Right;Left   Right Ankle Dorsiflexion 0   Left Ankle Dorsiflexion 2     PROM   PROM Assessment Site Ankle   Right/Left Ankle Right;Left   Right Ankle Dorsiflexion 4   Left Ankle Dorsiflexion 6     Palpation   Palpation comment TTP medial calcaneus     Ambulation/Gait   Gait Comments antalgic on R.             Objective measurements completed on examination: See above findings.          [facility] PT Treatment/Exercise - 08/21/16 0001      Exercises   Exercises Ankle     Modalities   Modalities Cryotherapy     Cryotherapy    Number Minutes Cryotherapy 10 Minutes  5 min concurrent with education   Cryotherapy Location Ankle   Type of Cryotherapy Ice pack     Ankle Exercises: Stretches   Gastroc Stretch 2 reps;30 seconds  both     Ankle Exercises: Seated   Towel Crunch Limitations toe scrunches   Toe Raise Limitations toe yoga   Other Seated Ankle Exercises yellow tband inversion                PT Education - 08/21/16 1608    Education provided Yes   Education Details anatomy of condition, POC, HEP, exercise form/[place]   Person(s) Educated Patient   Methods Explanation;Demonstration;Tactile cues;Verbal cues;Handout   Comprehension Verbalized understanding;Returned demonstration;Verbal cues required;Tactile cues required;Need further instruction             PT Long Term Goals - 08/21/16 1620      PT LONG TERM GOAL #1   Title DF ROM to 10 deg bilat by 8/24   Baseline see flowsheet   Time 5   Period Weeks   Status New     PT LONG TERM GOAL #2   Title Pt will be able to demonstrate ambulation pattern without antalgic presentation   Baseline limps on R   Time 5   Period Weeks   Status New     PT LONG TERM GOAL #3   Title FOTO to 40% limitaiton to indicate significant improvement in functional ability   Baseline 49% limitation at eval   Time 5   Period Weeks   Status New     PT LONG TERM GOAL #4   Title Pt will be able to walk for exercise, at least 30 min, for long term health/exercise   Baseline pain in a very short time   Status Achieved     PT LONG TERM GOAL #5   Title Will be able to volunteer in nursery pain <=2/10    Baseline severe pain at eval   Time 5   Period Weeks   Status New                Plan - 08/21/16 1609    Clinical Impression Statement Pt presents to PT with complaints of R ankle pain that began about 6 mo ago. Diagnosis of bilateral heel spurs listed but pt is unaware, no images available. Limited DF ROM creating excessive pull from  distal insertion point. Bilateral ankle and foot edema that pt reports has been cleared by a cardiologist. Discussed plan of care-which will begin after pt returns from vacation- and determining if spur is going to limit progress of achilles pain. Pt was agreeable. pt will benefit from skilled PT in order to improve achilles length and stability around ankle to decrease strain on achilles tendon.    History and Personal Factors relevant to plan of care: heel spur, diabetes, arthritis, obesity   Clinical Presentation Stable   Clinical Presentation due to: n/a   Clinical Decision Making Low   Rehab Potential Good   PT Frequency 2x / week   PT Duration --  5 weeks   PT Treatment/Interventions ADLs/Self Care Home Management;Cryotherapy;Electrical Stimulation;Iontophoresis 34m/ml Dexamethasone;Functional mobility training;Stair training;Gait training;Ultrasound;Traction;Moist Heat;Therapeutic activities;Therapeutic exercise;Balance training;Neuromuscular re-education;Patient/family education;Passive range of motion;Manual techniques;Dry needling;Taping;Vasopneumatic Device   PT Next Visit Plan DF stretching, ankle stability, ultrasound   PT Home Exercise Plan DF stretch, yellow tband inversion, toe yoga, towel scrunches;    Consulted and Agree with Plan of Care Patient      Patient will benefit from skilled therapeutic intervention in order to improve the following deficits and impairments:  Abnormal gait, Decreased range of motion, Difficulty walking, Increased muscle spasms, Decreased activity tolerance, Pain, Improper body mechanics, Impaired flexibility, Decreased strength, Increased edema, Postural dysfunction  Visit Diagnosis: Pain in right ankle and joints of right foot - Plan: PT plan of care cert/re-cert  Difficulty in walking, not elsewhere classified - Plan: PT plan of care cert/re-cert  Localized edema - Plan: PT plan of care cert/re-cert     Problem List Patient Active Problem  List   Diagnosis Date Noted  . Arthritis of both hands 04/03/2016  . Obesity 08/30/2015  . Right anterior knee pain 04/18/2015  . Palpitations 03/10/2015  . 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 Cataracts, bilateral 09/13/2014  . Glaucoma 09/13/2014  . Mitral regurgitation 02/18/2014  . Lateral pain of right hip 01/07/2014  . 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 Transaminitis 11/13/2012  . Personal history of colonic polyps 04/17/2012  . Bilateral foot pain 02/21/2012  . Fatigue 11/12/2011  . Health care maintenance 11/12/2011  . Hyperlipidemia 04/04/2011  . Diabetes ([place] 06/07/2010  . Cancer of central portion of right female breast ([place] 10/04/2009  . Idiopathic thrombocytopenic purpura (ITP) 06/22/2009  . Essential hypertension 06/22/2009  . HYPERPARATHYROIDISM, HX OF 06/22/2009  . Avitaminosis D 09/01/2008    GCODE: Current CK Predicted CJ Discharge Ck   Jessica C. Hightower PT, DPT 08/21/16 4:24 PM   [facility]GW[address] [place] [address]Phone: [phone]  Fax:  [phone] Name: AElisheba McdonnellMRN: [id number] of Birth: [birth year]  PHYSICAL THERAPY DISCHARGE SUMMARY  Visits from Start of Care: 1  Current functional level related to goals / functional outcomes: See above   Remaining deficits: See above   Education / Equipment: Anatomy of condition, POC, HEP, exercise form/rationale  Plan: Patient agrees to discharge.  Patient goals were not met. Patient is being discharged due to the patient's request.  ?????    Jessica C. Hightower PT, DPT 09/12/16 6:44 PM

## 2016-08-28 ENCOUNTER — Encounter: Payer: Self-pay | Admitting: Internal Medicine

## 2016-08-28 ENCOUNTER — Ambulatory Visit (INDEPENDENT_AMBULATORY_CARE_PROVIDER_SITE_OTHER): Payer: 59 | Admitting: Internal Medicine

## 2016-08-28 VITALS — BP 130/76 | HR 83 | Temp 98.2°F | Wt 207.0 lb

## 2016-08-28 DIAGNOSIS — R600 Localized edema: Secondary | ICD-10-CM | POA: Diagnosis not present

## 2016-08-28 DIAGNOSIS — E11319 Type 2 diabetes mellitus with unspecified diabetic retinopathy without macular edema: Secondary | ICD-10-CM

## 2016-08-28 LAB — POCT GLYCOSYLATED HEMOGLOBIN (HGB A1C): Hemoglobin A1C: 6.3

## 2016-08-28 NOTE — Progress Notes (Signed)
Subjective:    [name] - 56 y.o. female MRN [id number]  Date of birth: 17-May-1960  HPI  [name] is here for leg edema.  Leg edema - Location: Bilateral feet  - Level: to ankles  - Onset: gradual 4 months - Progression: unchanged - Treatment: Diuretic medication takes [name] and HCTZ for HTN                      Has not tried compressive stockings.  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     - PMH:   Heart Failure:no,  Last EF% = 50-55% on Jan 2016   Liver Disease: history of elevated LFTs with ?NAFLD but WNL Feb 2018   Advanced Renal Disease: no  Protein Malnutrition: no  Hypoalbuminemia:no  Chronic Venous Insufficiency: no    History of chronic leg edema: had problems with bilateral LE edema, amlodipine was discontinued >1 year ago and edema resolved   Thyroid disease: no  Anemia:no   - Associated Symptoms:   Dyspnea: no  Orthopnea:no  DOE: no  Abdominal swelling:no    Well's DVT Criteria: Malignancy, Recent Immobilization, Recent bedridden or  major surgery, Entire leg swelling, one calf swollen more (>3cm) than other, Hx  of prior DVT, Varicose Veins, or Other more likely diagnosis than DVT? no    Medications  Is patient on DHP CCB, Hydralazine, Pregabalin, Megace, Thiazolidinedione,  NSAID, Systemic Corticosteroid or Estrogen? no  Recent weight trends:  Filed Weights   08/28/16 1402  Weight: 207 lb (93.9 kg)    Most Recent Labs             BMP Latest Ref Rng & Units 05/13/2016 04/28/2016 04/03/2016  Glucose 65 - 99 mg/dL 118(H) 144(H) 110(H)  BUN 7 - 25 mg/dL 14 16 11   Creatinine 0.50 - 1.05 mg/dL 0.93 1.03(H) 0.87  BUN/Creat Ratio 9 - 23 - - 13  Sodium 135 - 146 mmol/L 139 139 142  Potassium 3.5 - 5.3 mmol/L 4.5 4.4 4.4  Chloride 98 - 110  mmol/L 105 105 101  CO2 20 - 31 mmol/L 29 24 24   Calcium 8.6 - 10.4 mg/dL 9.6 9.3 9.6                 CMP Latest Ref Rng & Units 05/13/2016 04/28/2016 04/03/2016  Glucose 65 - 99 mg/dL 118(H) 144(H) 110(H)  BUN 7 - 25 mg/dL 14 16 11   Creatinine 0.50 - 1.05 mg/dL 0.93 1.03(H) 0.87  Sodium 135 - 146 mmol/L 139 139 142  Potassium 3.5 - 5.3 mmol/L 4.5 4.4 4.4  Chloride 98 - 110 mmol/L 105 105 101  CO2 20 - 31 mmol/L 29 24 24   Calcium 8.6 - 10.4 mg/dL 9.6 9.3 9.6  Total Protein 6.0 - 8.5 g/dL - - 6.8  Total Bilirubin 0.0 - 1.2 mg/dL - - <0.2  Alkaline Phos 39 - 117 IU/L - - 39  AST 0 - 40 IU/L - - 37  ALT 0 - 32 IU/L - - 30                Lab Results  Component Value Date   TSH 0.846 04/28/2016      ROS Denies Fever/Chills Denies Pain in Legs Denies Leg Rash  Denies Itching Legs    -  reports that she has never smoked. She has never used smokeless tobacco. - Review of Systems: Per HPI. - Past Medical History: Patient Active Problem List   Diagnosis Date Noted  . Arthritis of both hands 04/03/2016  . Obesity 08/30/2015  . Right anterior knee pain 04/18/2015  . Palpitations 03/10/2015  . 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 Cataracts, bilateral 09/13/2014  . Glaucoma 09/13/2014  . Mitral regurgitation 02/18/2014  . Lateral pain of right hip 01/07/2014  . 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 Transaminitis 11/13/2012  . Personal history of colonic polyps 04/17/2012  . Bilateral foot pain 02/21/2012  . Fatigue 11/12/2011  . Health care maintenance 11/12/2011  . Hyperlipidemia 04/04/2011  . Diabetes ([place]) 06/07/2010  . Cancer of central portion of right female breast ([place]) 10/04/2009  . Idiopathic thrombocytopenic purpura (ITP) 06/22/2009  . Essential hypertension 06/22/2009  . HYPERPARATHYROIDISM, HX OF 06/22/2009  . Avitaminosis D 09/01/2008   - Medications: reviewed and updated   Objective:   Physical Exam BP 130/76   Pulse 83   Temp 98.2 F (36.8  C) (Oral)   Wt 207 lb (93.9 kg)   SpO2 98%   BMI 36.67 kg/m  Gen: NAD, alert, cooperative with exam, well-appearing CV: RRR, good S1/S2, no murmur, no edema, capillary refill brisk  Resp: CTABL, no wheezes, non-labored Ext: 1+ pedal non-pitting bilateral edema. 2+ pedal pulses. No erythema or increased warmth to the skin. Negative Homan's sign bilaterally. No TTP over feet or calves.     Assessment & Plan:   Lower extremity edema Suspect dependent edema as it worsens with heat and standing. Bilateral bone spurs likely complicating picture as they may pre-dispose patient to inflammation/edema in that area. Well's score for DVT 0 and no signs on exam to suggest DVT. No signs of soft tissue infection causing edema. No symptoms to suggest cardiac etiology and patient with normal echo in 2016. Considered compressive cause of edema especially given past history of breast cancer however edema is minimal and history is very suggestive of dependent etiology. Will check some labs to evaluate for other potential causes of edema. Recommended OTC compressive stockings. Return in one month if not improved. Return precautions warranting more immediate evaluate discussed.  - TSH - Comprehensive metabolic panel - CBC  Phill Myron, D.O. 08/28/2016, 2:40 PM PGY-3, [place]

## 2016-08-28 NOTE — Patient Instructions (Signed)
I would recommend compression stockings for your feet swelling and keeping feet elevated as much as possible. We will also check some labs today to make sure there isn't another cause of your swelling.   Please return in one month if swelling not improved. If you have swelling continues to move up your leg, you have pain in your calves, you have shortness of breath, or chest pain you need to be seen.   Take Care,  Dr. Juleen China

## 2016-08-29 LAB — CBC
Hematocrit: 37.2 % (ref 34.0–46.6)
Hemoglobin: 11.7 g/dL (ref 11.1–15.9)
MCH: 24.3 pg — ABNORMAL LOW (ref 26.6–33.0)
MCHC: 31.5 g/dL (ref 31.5–35.7)
MCV: 77 fL — ABNORMAL LOW (ref 79–97)
PLATELETS: 80 10*3/uL — AB (ref 150–379)
RBC: 4.82 x10E6/uL (ref 3.77–5.28)
RDW: 15.3 % (ref 12.3–15.4)
WBC: 7.8 10*3/uL (ref 3.4–10.8)

## 2016-08-29 LAB — COMPREHENSIVE METABOLIC PANEL
ALBUMIN: 3.8 g/dL (ref 3.5–5.5)
ALT: 34 IU/L — ABNORMAL HIGH (ref 0–32)
AST: 50 IU/L — AB (ref 0–40)
Albumin/Globulin Ratio: 1.2 (ref 1.2–2.2)
Alkaline Phosphatase: 35 IU/L — ABNORMAL LOW (ref 39–117)
BUN/Creatinine Ratio: 12 (ref 9–23)
BUN: 12 mg/dL (ref 6–24)
CHLORIDE: 102 mmol/L (ref 96–106)
CO2: 24 mmol/L (ref 20–29)
Calcium: 9.2 mg/dL (ref 8.7–10.2)
Creatinine, Ser: 0.99 mg/dL (ref 0.57–1.00)
GFR calc Af Amer: 74 mL/min/{1.73_m2} (ref >59)
GFR calc non Af Amer: 64 mL/min/{1.73_m2} (ref >59)
GLOBULIN, TOTAL: 3.2 g/dL (ref 1.5–4.5)
Glucose: 105 mg/dL — ABNORMAL HIGH (ref 65–99)
Potassium: 4.4 mmol/L (ref 3.5–5.2)
SODIUM: 141 mmol/L (ref 134–144)
Total Protein: 7 g/dL (ref 6.0–8.5)

## 2016-08-29 LAB — TSH: TSH: 1.09 u[IU]/mL (ref 0.450–4.500)

## 2016-09-05 ENCOUNTER — Telehealth: Payer: Self-pay | Admitting: *Deleted

## 2016-09-05 NOTE — Telephone Encounter (Signed)
Hi, I'm covering Dr. Alcario Drought inbox while she is out of the office. I reviewed [name]'s labs and last clinic note. Please call [name] and let her know her CBC, CMP, and TSH labs from 7/25 visit were normal, with the exception of low platelets that she normally has. Nothing to explain any swelling in her legs. Dr. Juleen China thought this was most likely due to dependent edema, which happens after a day of being up on your feet. If she is still having lower extremity swelling she was advised to wear compression stockings. She can make a follow up appointment with her PCP, Dr. Juleen China will be back in the office next week if she has further concerns.

## 2016-09-05 NOTE — Telephone Encounter (Signed)
Pt informed. [name], CMA  

## 2016-09-05 NOTE — Telephone Encounter (Signed)
Pt calling in wanting to know her results. Please advise. Deseree Kennon Holter, CMA

## 2016-09-06 ENCOUNTER — Telehealth: Payer: Self-pay | Admitting: Internal Medicine

## 2016-09-06 NOTE — Telephone Encounter (Signed)
Have you had a dilated eye exam in the past year? Her last exam was June 2017 at Dr. Shanon Rosser on [address]. Reports the paperwork should be here but plans on bringing in the results by hand.  - Mesha Guinyard

## 2016-09-09 ENCOUNTER — Ambulatory Visit: Payer: 59 | Admitting: Sports Medicine

## 2016-09-10 ENCOUNTER — Encounter: Payer: 59 | Admitting: Physical Therapy

## 2016-09-16 ENCOUNTER — Encounter: Payer: 59 | Admitting: Physical Therapy

## 2016-09-18 ENCOUNTER — Encounter: Payer: 59 | Admitting: Physical Therapy

## 2016-09-23 ENCOUNTER — Other Ambulatory Visit: Payer: Self-pay | Admitting: *Deleted

## 2016-09-23 ENCOUNTER — Encounter: Payer: 59 | Admitting: Physical Therapy

## 2016-09-24 NOTE — Telephone Encounter (Signed)
LM with a female to have pt call back to inform her of below and see about getting her scheduled for an appointment per Dr. Juleen China.  If she calls back please inform her of this and help her. Katharina Caper, Davin Muramoto D, [place]

## 2016-09-24 NOTE — Telephone Encounter (Signed)
Tramadol has not been filled since Feb 2018. Patient will need appointment regarding this medication.   Phill Myron, D.O. 09/24/2016, 9:18 AM PGY-3, [place]

## 2016-09-25 ENCOUNTER — Encounter: Payer: 59 | Admitting: Physical Therapy

## 2016-09-25 NOTE — Telephone Encounter (Signed)
Patient called requesting refill on Tramadol.  Advise patient that there was a message left with female for her schedule an appointment. The medication will not be filled. Patient stated there was not a message left yesterday. Patient got upset and hung up the phone.  Derl Barrow, RN

## 2016-09-30 ENCOUNTER — Encounter: Payer: 59 | Admitting: Physical Therapy

## 2016-10-02 ENCOUNTER — Encounter: Payer: 59 | Admitting: Physical Therapy

## 2016-10-02 ENCOUNTER — Encounter: Payer: Self-pay | Admitting: Internal Medicine

## 2016-10-02 ENCOUNTER — Ambulatory Visit (INDEPENDENT_AMBULATORY_CARE_PROVIDER_SITE_OTHER): Payer: 59 | Admitting: Internal Medicine

## 2016-10-02 DIAGNOSIS — M25551 Pain in right hip: Secondary | ICD-10-CM

## 2016-10-02 DIAGNOSIS — I10 Essential (primary) hypertension: Secondary | ICD-10-CM | POA: Diagnosis not present

## 2016-10-02 DIAGNOSIS — E11319 Type 2 diabetes mellitus with unspecified diabetic retinopathy without macular edema: Secondary | ICD-10-CM | POA: Diagnosis not present

## 2016-10-02 MED ORDER — TRAMADOL HCL 50 MG PO TABS: 50.0000 mg | ORAL_TABLET | Freq: Two times a day (BID) | ORAL | 1 refills | Status: DC | PRN

## 2016-10-02 MED ORDER — CARVEDILOL 12.5 MG PO TABS: 12.5000 mg | ORAL_TABLET | Freq: Two times a day (BID) | ORAL | 0 refills | Status: DC

## 2016-10-02 NOTE — Patient Instructions (Signed)
I have sent Coreg 12.5 mg twice per day. You can cut your current dose in half until you run out of that medication. Wait about two hours to check your blood pressure after taking your medications. Keep a blood pressure log for me. Call me if your BP <90/60 or >130/80.

## 2016-10-02 NOTE — Assessment & Plan Note (Signed)
>>  ASSESSMENT AND PLAN FOR DM (DIABETES MELLITUS) WITH COMPLICATIONS (HCC) WRITTEN ON 10/02/2016  2:56 PM BY WALLACE, CATHERINE LAUREN, DO  Well controlled from recent A1c. Continue Metformin  and will plan to check A1c at next visit.

## 2016-10-02 NOTE — Assessment & Plan Note (Signed)
BP at goal today, however patient endorsing symptomatic episodes of hypotension. Will decrease Coreg to 12.5 mg BID from 25 mg BID. Patient to keep daily blood pressure log. Advised waiting at least 2 hours after taking medication for most accurate BP reading. Patient to call if BP <90/60 or >130/80 with medication changes.

## 2016-10-02 NOTE — Assessment & Plan Note (Signed)
Well controlled from recent A1c. Continue Metformin and will plan to check A1c at next visit.

## 2016-10-02 NOTE — Assessment & Plan Note (Signed)
Pain may be related to OA, although most recent X-ray from 2015 negative for bony disease, or to greater trochanteric pain syndrome although non TTP over that area and able to tolerate lying on that side. May be related to compensatory mechanism for chronic right knee OA. Reviewed [place] Controlled Substance database. Tramadol 50 mg 60 pills with 1 refill prescribed 04/03/16. Refills dispensed on 04/16/16 and 07/18/16 at the same pharmacy. Will refill her Tramadol #60 with 1 refill given that she is unable to take Tylenol due to NAFLD and unable to take NSAIDs due to thrombocytopenia. Patient appears to be using Tramadol very sparingly based on refill history.

## 2016-10-02 NOTE — Progress Notes (Signed)
Subjective:    [name] - 56 y.o. female MRN [id number]  Date of birth: September 15, 1960  HPI  [name] is here for follow up.  Chronic HTN Disease Monitoring:  Home BP Monitoring - Yes checks at least one per day. Drops to 42H systolic about one hour after taking medication. Feels kind of lightheaded but if she sits down and drinks water her BP will normalize. If BP was low in the morning will not take nighttime dose of Coreg.  Chest pain- no  Dyspnea- no Headache - no  Medications: HCTZ 12.5 mg daily, Coreg 25 mg BID, Spironolactone 25 mg daily, Lisinopril 40 mg daily  Compliance- yes Lightheadedness- yes  Edema- no    Diabetes mellitus, Type 2 Disease Monitoring Blood Sugar Ranges: Fasting - 120s on average Polyuria: no  Visual problems: stable    Urine Microalbumin: On Ace-inhibitor   Last A1C: 6.3   Medication Compliance: yes  Medication Side Effects Hypoglycemia: no   Preventitive Health Care Eye Exam: Reports last eye exam this past summer  Foot Exam: UTD   Hip Pain: Patient reports chronic pain in her right hip. Endorses history of arthritis. Takes Tramadol prn. Reports use about twice per week at the most and usually cuts pill in half. Able to tolerate lying on the right hip at nighttime. No pain radiating down leg although has chronic right knee pain as well from OA. No numbness/tingling.      Health Maintenance Due  Topic Date Due  . OPHTHALMOLOGY EXAM  09/08/2015  . INFLUENZA VACCINE  09/04/2016    -  reports that she has never smoked. She has never used smokeless tobacco. - Review of Systems: Per HPI. - Past Medical History: Patient Active Problem List   Diagnosis Date Noted  . Arthritis of both hands 04/03/2016  . Obesity 08/30/2015  . Right anterior knee pain 04/18/2015  . Palpitations 03/10/2015  . 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  Cataracts, bilateral 09/13/2014  . Glaucoma 09/13/2014  . Mitral regurgitation 02/18/2014  . Hip pain 01/07/2014  . 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 Transaminitis 11/13/2012  . Personal history of colonic polyps 04/17/2012  . Bilateral foot pain 02/21/2012  . Fatigue 11/12/2011  . Health care maintenance 11/12/2011  . Hyperlipidemia 04/04/2011  . Diabetes ([place]) 06/07/2010  . Cancer of central portion of right female breast ([place]) 10/04/2009  . Idiopathic thrombocytopenic purpura (ITP) 06/22/2009  . Essential hypertension 06/22/2009  . HYPERPARATHYROIDISM, HX OF 06/22/2009  . Avitaminosis D 09/01/2008   - Medications: reviewed and updated   Objective:   Physical Exam BP 122/76   Pulse 94   Temp 98.2 F (36.8 C) (Oral)   Ht 5\' 3"  (1.6 m)   Wt 208 lb (94.3 kg)   SpO2 98%   BMI 36.85 kg/m  Gen: NAD, alert, cooperative with exam, well-appearing CV: RRR, good S1/S2, no murmur, no edema, capillary refill brisk  Resp: CTABL, no wheezes, non-labored Hip: No TTP over the greater trochanter of right hip. Good hip ROM. Negative log roll. Negative straight leg test.     Assessment & Plan:   Essential hypertension BP at goal today, however patient endorsing symptomatic episodes of hypotension. Will decrease Coreg to 12.5 mg BID from 25 mg BID. Patient to keep daily blood pressure log. Advised waiting at least 2 hours after taking medication for most accurate BP reading. Patient to call if BP <90/60 or >130/80 with medication changes.   Diabetes (  [place]) Well controlled from recent A1c. Continue Metformin and will plan to check A1c at next visit.   Hip pain Pain may be related to OA, although most recent X-ray from 2015 negative for bony disease, or to greater trochanteric pain syndrome although non TTP over that area and able to tolerate lying on that side. May be related to compensatory mechanism for chronic right knee OA. Reviewed [place] Controlled Substance database. Tramadol 50 mg 60  pills with 1 refill prescribed 04/03/16. Refills dispensed on 04/16/16 and 07/18/16 at the same pharmacy. Will refill her Tramadol #60 with 1 refill given that she is unable to take Tylenol due to NAFLD and unable to take NSAIDs due to thrombocytopenia. Patient appears to be using Tramadol very sparingly based on refill history.   [name], D.O. 10/02/2016, 3:00 PM PGY-3, [place]

## 2016-10-03 ENCOUNTER — Other Ambulatory Visit: Payer: Self-pay | Admitting: *Deleted

## 2016-10-03 MED ORDER — METFORMIN HCL ER 750 MG PO TB24: 750.0000 mg | ORAL_TABLET | Freq: Every day | ORAL | 1 refills | Status: DC

## 2016-10-08 ENCOUNTER — Encounter: Payer: 59 | Admitting: Physical Therapy

## 2016-10-13 ENCOUNTER — Other Ambulatory Visit: Payer: Self-pay | Admitting: Hematology and Oncology

## 2016-10-14 DIAGNOSIS — M79672 Pain in left foot: Secondary | ICD-10-CM | POA: Diagnosis not present

## 2016-10-14 DIAGNOSIS — M7731 Calcaneal spur, right foot: Secondary | ICD-10-CM | POA: Diagnosis not present

## 2016-10-14 DIAGNOSIS — M722 Plantar fascial fibromatosis: Secondary | ICD-10-CM | POA: Diagnosis not present

## 2016-10-14 DIAGNOSIS — M71571 Other bursitis, not elsewhere classified, right ankle and foot: Secondary | ICD-10-CM | POA: Diagnosis not present

## 2016-10-14 DIAGNOSIS — M7661 Achilles tendinitis, right leg: Secondary | ICD-10-CM | POA: Diagnosis not present

## 2016-10-14 DIAGNOSIS — M79671 Pain in right foot: Secondary | ICD-10-CM | POA: Diagnosis not present

## 2016-11-01 DIAGNOSIS — M722 Plantar fascial fibromatosis: Secondary | ICD-10-CM | POA: Diagnosis not present

## 2016-11-01 DIAGNOSIS — M71571 Other bursitis, not elsewhere classified, right ankle and foot: Secondary | ICD-10-CM | POA: Diagnosis not present

## 2016-11-01 DIAGNOSIS — M7661 Achilles tendinitis, right leg: Secondary | ICD-10-CM | POA: Diagnosis not present

## 2016-11-19 NOTE — Assessment & Plan Note (Signed)
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 Started Tamoxifen 09/2010  Tamoxifen toxicities: 1. Hot flashes improved with Effexor 2. joint and muscle stiffness: I encouraged her to exercise and stay active. Plan is to continue tamoxifen for 10 years versus switching her to aromatase inhibitors once she reaches menopause FSH and estradiol levels were drawn today and I will call her with the results of this test. The decision would be to switch her to aromatase inhibitor therapy if she is menopausal. However because anastrozole but increase or myalgias, we may elect to keep her on tamoxifen alone.  Breast cancer surveillance: 1. Breast exam 11/22/2016 is normal 2. Mammogram 04/30/2016 is normal on the left breast category B breast density  Chronic mild thrombocytopenia: Patient complains of hair loss. Return to clinic in 1 year for follow-up

## 2016-11-21 ENCOUNTER — Other Ambulatory Visit: Payer: Self-pay

## 2016-11-22 ENCOUNTER — Telehealth: Payer: Self-pay | Admitting: Hematology and Oncology

## 2016-11-22 ENCOUNTER — Ambulatory Visit (HOSPITAL_BASED_OUTPATIENT_CLINIC_OR_DEPARTMENT_OTHER): Payer: 59 | Admitting: Hematology and Oncology

## 2016-11-22 ENCOUNTER — Other Ambulatory Visit (HOSPITAL_BASED_OUTPATIENT_CLINIC_OR_DEPARTMENT_OTHER): Payer: 59

## 2016-11-22 DIAGNOSIS — Z170 Estrogen receptor positive status [ER+]: Secondary | ICD-10-CM | POA: Diagnosis not present

## 2016-11-22 DIAGNOSIS — C50111 Malignant neoplasm of central portion of right female breast: Secondary | ICD-10-CM | POA: Diagnosis not present

## 2016-11-22 LAB — CBC WITH DIFFERENTIAL/PLATELET
BASO%: 0.6 % (ref 0.0–2.0)
Basophils Absolute: 0 10*3/uL (ref 0.0–0.1)
EOS ABS: 0.2 10*3/uL (ref 0.0–0.5)
EOS%: 2.9 % (ref 0.0–7.0)
HCT: 40 % (ref 34.8–46.6)
HGB: 12.6 g/dL (ref 11.6–15.9)
LYMPH%: 44.9 % (ref 14.0–49.7)
MCH: 24.9 pg — ABNORMAL LOW (ref 25.1–34.0)
MCHC: 31.5 g/dL (ref 31.5–36.0)
MCV: 79.1 fL — AB (ref 79.5–101.0)
MONO#: 0.5 10*3/uL (ref 0.1–0.9)
MONO%: 7 % (ref 0.0–14.0)
NEUT%: 44.6 % (ref 38.4–76.8)
NEUTROS ABS: 3.2 10*3/uL (ref 1.5–6.5)
Platelets: 82 10*3/uL — ABNORMAL LOW (ref 145–400)
RBC: 5.06 10*6/uL (ref 3.70–5.45)
RDW: 15 % — ABNORMAL HIGH (ref 11.2–14.5)
WBC: 7.2 10*3/uL (ref 3.9–10.3)
lymph#: 3.2 10*3/uL (ref 0.9–3.3)

## 2016-11-22 LAB — COMPREHENSIVE METABOLIC PANEL
ALT: 37 U/L (ref 0–55)
AST: 47 U/L — AB (ref 5–34)
Albumin: 3.5 g/dL (ref 3.5–5.0)
Alkaline Phosphatase: 38 U/L — ABNORMAL LOW (ref 40–150)
Anion Gap: 9 mEq/L (ref 3–11)
BUN: 13.6 mg/dL (ref 7.0–26.0)
CALCIUM: 9.9 mg/dL (ref 8.4–10.4)
CHLORIDE: 104 meq/L (ref 98–109)
CO2: 28 meq/L (ref 22–29)
Creatinine: 0.9 mg/dL (ref 0.6–1.1)
EGFR: >60 mL/min/{1.73_m2} (ref >60)
Glucose: 109 mg/dl (ref 70–140)
POTASSIUM: 4.5 meq/L (ref 3.5–5.1)
Sodium: 140 mEq/L (ref 136–145)
Total Bilirubin: 0.37 mg/dL (ref 0.20–1.20)
Total Protein: 7.5 g/dL (ref 6.4–8.3)

## 2016-11-22 MED ORDER — TAMOXIFEN CITRATE 20 MG PO TABS: 20.0000 mg | ORAL_TABLET | Freq: Every day | ORAL | 3 refills | Status: DC

## 2016-11-22 NOTE — Telephone Encounter (Signed)
Gave patient avs and calendar with appts per 10/19 los °

## 2016-11-22 NOTE — Progress Notes (Signed)
Patient Care Team: Nicolette Bang, DO as PCP - General (Family Medicine)  DIAGNOSIS:  Encounter Diagnosis  Name Primary?  . Malignant neoplasm of central portion of right breast in female, estrogen receptor positive ([place])     SUMMARY OF ONCOLOGIC HISTORY:   Cancer of central portion of right female breast ([place])   07/21/2009 Surgery    Right breast mastectomy: 2 foci of invasive ductal carcinoma 2.2 cm, 0.5 cm, ER positive PR positive HER-2 negative Ki-67 11%, Oncotype score 27, 18% ROR      10/23/2009 - 12/25/2009 Chemotherapy    Adjuvant chemotherapy with Taxotere and Cytoxan every 3 weeks 4 cycles followed by reconstruction of the breast by Dr. Harlow Mares      02/13/2010 -  Anti-estrogen oral therapy    Tamoxifen 20 mg daily      02/19/2010 - 03/28/2010 Radiation Therapy    Adjuvant radiation therapy      09/12/2010 Surgery    Recurrence of invasive ductal carcinoma of the right mastectomy scar is a nodule, invasive ductal carcinoma margins positive, ER 96%, PR 75%, HER-2 negative      09/17/2013 Surgery    Left breast reduction surgery       CHIEF COMPLIANT: follow-up on tamoxifen therapy  INTERVAL HISTORY: [name] is a 56 year old with above-mentioned history right breast cancer treated with adjuvant chemotherapy followed by radiation and tamoxifen. She is tolerating tamoxifen extremely well. She does have occasional aches and pains and occasional hot flashes which have both improved over time. She denies any lumps or nodules in the breast.her right breast had a mastectomy with reconstruction.  REVIEW OF SYSTEMS:   Constitutional: Denies fevers, chills or abnormal weight loss Eyes: Denies blurriness of vision Ears, nose, mouth, throat, and face: Denies mucositis or sore throat Respiratory: Denies cough, dyspnea or wheezes Cardiovascular: Denies palpitation, chest discomfort Gastrointestinal:  Denies nausea, heartburn or change in bowel habits Skin:  Denies abnormal skin rashes Lymphatics: Denies new lymphadenopathy or easy bruising Neurological:Denies numbness, tingling or new weaknesses Behavioral/Psych: Mood is stable, no new changes  Extremities: No lower extremity edema Breast:  denies any pain or lumps or nodules in either breasts All other systems were reviewed with the patient and are negative.  I have reviewed the past medical history, past surgical history, social history and family history with the patient and they are unchanged from previous note.  ALLERGIES:  has No Known Allergies.  MEDICATIONS:  Current Outpatient Prescriptions  Medication Sig Dispense Refill  . Blood Glucose Monitoring Suppl (ONE TOUCH ULTRA 2) w/Device KIT USE   TO CHECK GLUCOSE UP TO THREE TIMES DAILY AS NEEDED 1 each 0  . carvedilol (COREG) 12.5 MG tablet Take 1 tablet (12.5 mg total) by mouth 2 (two) times daily with a meal. 90 tablet 0  . glucose blood (ONE TOUCH ULTRA TEST) test strip Check blood sugar once daily 100 each 3  . hydrochlorothiazide (MICROZIDE) 12.5 MG capsule Take 1 capsule (12.5 mg total) by mouth daily. 90 capsule 1  . Lancets (ONETOUCH ULTRASOFT) lancets Check daily as needed 100 each 12  . lisinopril (PRINIVIL,ZESTRIL) 40 MG tablet Take 1 tablet (40 mg total) by mouth daily. 90 tablet 3  . metFORMIN (GLUCOPHAGE XR) 750 MG 24 hr tablet Take 1 tablet (750 mg total) by mouth daily with breakfast. 90 tablet 1  . pravastatin (PRAVACHOL) 40 MG tablet Take 1 tablet (40 mg total) by mouth every evening. 90 tablet 6  . spironolactone (ALDACTONE) 25 MG  tablet Take 25 mg by mouth daily.    . tamoxifen (NOLVADEX) 20 MG tablet TAKE ONE TABLET BY MOUTH ONCE DAILY 90 tablet 3  . traMADol (ULTRAM) 50 MG tablet Take 1 tablet (50 mg total) by mouth 2 (two) times daily as needed. 60 tablet 1   No current facility-administered medications for this visit.     PHYSICAL EXAMINATION: ECOG PERFORMANCE STATUS: 1 - Symptomatic but completely  ambulatory  Vitals:   11/22/16 0923  BP: 138/75  Pulse: 76  Resp: 20  Temp: 98.4 F (36.9 C)  SpO2: 99%   Filed Weights   11/22/16 0923  Weight: 203 lb 4.8 oz (92.2 kg)    GENERAL:alert, no distress and comfortable SKIN: skin color, texture, turgor are normal, no rashes or significant lesions EYES: normal, Conjunctiva are pink and non-injected, sclera clear OROPHARYNX:no exudate, no erythema and lips, buccal mucosa, and tongue normal  NECK: supple, thyroid normal size, non-tender, without nodularity LYMPH:  no palpable lymphadenopathy in the cervical, axillary or inguinal LUNGS: clear to auscultation and percussion with normal breathing effort HEART: regular rate & rhythm and no murmurs and no lower extremity edema ABDOMEN:abdomen soft, non-tender and normal bowel sounds MUSCULOSKELETAL:no cyanosis of digits and no clubbing  NEURO: alert & oriented x 3 with fluent speech, no focal motor/sensory deficits EXTREMITIES: No lower extremity edema BREAST: No palpable masses or nodules in either right or left breasts. Right breast reconstruction.  No palpable axillary supraclavicular or infraclavicular adenopathy no breast tenderness or nipple discharge. (exam performed in the presence of a chaperone)  LABORATORY DATA:  I have reviewed the data as listed   Chemistry      Component Value Date/Time   NA 141 08/28/2016 1424   NA 143 11/22/2014 0805   K 4.4 08/28/2016 1424   K 3.1 (L) 11/22/2014 0805   CL 102 08/28/2016 1424   CL 105 02/21/2012 0806   CO2 24 08/28/2016 1424   CO2 28 11/22/2014 0805   BUN 12 08/28/2016 1424   BUN 12.2 11/22/2014 0805   CREATININE 0.99 08/28/2016 1424   CREATININE 0.93 05/13/2016 0940   CREATININE 0.9 11/22/2014 0805      Component Value Date/Time   CALCIUM 9.2 08/28/2016 1424   CALCIUM 9.9 11/22/2014 0805   ALKPHOS 35 (L) 08/28/2016 1424   ALKPHOS 43 11/22/2014 0805   AST 50 (H) 08/28/2016 1424   AST 65 (H) 11/22/2014 0805   ALT 34 (H)  08/28/2016 1424   ALT 44 11/22/2014 0805   BILITOT <0.2 08/28/2016 1424   BILITOT 0.58 11/22/2014 0805       Lab Results  Component Value Date   WBC 7.2 11/22/2016   HGB 12.6 11/22/2016   HCT 40.0 11/22/2016   MCV 79.1 (L) 11/22/2016   PLT 82 (L) 11/22/2016   NEUTROABS 3.2 11/22/2016    ASSESSMENT & PLAN:  Cancer of central portion of right female breast ([place]) 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 Started Tamoxifen 09/2010  Tamoxifen toxicities: 1. Hot flashes improved with Effexor 2. joint and muscle stiffness: I encouraged her to exercise and stay active. Plan is to continue tamoxifen for 10 years  Breast cancer surveillance: 1. Breast exam 11/22/2016 is normal  2. Mammogram 04/30/2016 is normal on the left breast category B breast density  Chronic mild thrombocytopenia: platelet count is 18 and they're stable  Return to clinic in 1 year for follow-up    I spent 25 minutes talking to the patient of which more than half was spent in counseling and coordination of care.  Orders Placed This Encounter  Procedures  . CBC with Differential    Standing Status:   Future    Number of Occurrences:   1    Standing Expiration Date:   11/22/2017  . Comprehensive metabolic panel    Standing Status:   Future    Number of Occurrences:   1    Standing Expiration Date:   11/22/2017   The patient has a good understanding of the overall plan. she agrees with it. she will call with any problems that may develop before the next visit here.   Rulon Eisenmenger, MD 11/22/16

## 2016-11-23 ENCOUNTER — Other Ambulatory Visit: Payer: Self-pay | Admitting: Cardiology

## 2016-12-07 ENCOUNTER — Ambulatory Visit (HOSPITAL_COMMUNITY)
Admission: EM | Admit: 2016-12-07 | Discharge: 2016-12-07 | Disposition: A | Discharge status: Home / Self Care | Payer: Medicare Other | Attending: Family Medicine | Admitting: Family Medicine

## 2016-12-07 ENCOUNTER — Encounter (HOSPITAL_COMMUNITY): Payer: Self-pay

## 2016-12-07 DIAGNOSIS — B9789 Other viral agents as the cause of diseases classified elsewhere: Secondary | ICD-10-CM | POA: Diagnosis not present

## 2016-12-07 DIAGNOSIS — J069 Acute upper respiratory infection, unspecified: Secondary | ICD-10-CM | POA: Diagnosis not present

## 2016-12-07 MED ORDER — PREDNISONE 10 MG (21) PO TBPK: ORAL_TABLET | Freq: Every day | ORAL | 0 refills | Status: DC

## 2016-12-07 MED ORDER — HYDROCODONE-HOMATROPINE 5-1.5 MG/5ML PO SYRP: 5.0000 mL | ORAL_SOLUTION | Freq: Four times a day (QID) | ORAL | 0 refills | Status: DC | PRN

## 2016-12-07 NOTE — Discharge Instructions (Signed)
Be aware, your cough medication may cause drowsiness. Please do not drive, operate heavy machinery or make important decisions while on this medication, it can cloud your judgement.  

## 2016-12-07 NOTE — ED Provider Notes (Signed)
  [place]   [id number] 12/07/16 Arrival Time: 9476  ASSESSMENT & PLAN:  1. Viral URI with cough   with mild wheezing  Meds ordered this encounter  Medications  . HYDROcodone-homatropine (HYCODAN) 5-1.5 MG/5ML syrup    Sig: Take 5 mLs by mouth every 6 (six) hours as needed for cough.    Dispense:  60 mL    Refill:  0  . predniSONE (STERAPRED UNI-PAK 21 TAB) 10 MG (21) TBPK tablet    Sig: Take by mouth daily. Take as directed.    Dispense:  21 tablet    Refill:  0   Medication sedation precautions. OTC symptom care as needed. Will f/u if needed.  Reviewed expectations re: course of current medical issues. Questions answered. Outlined signs and symptoms indicating need for more acute intervention. Patient verbalized understanding. After Visit Summary given.   SUBJECTIVE:  [name] is a 56 y.o. female who presents with complaint of nasal congestion, post-nasal drainage, and a persistent cough. Onset abrupt approximately 2 days ago. Overall fatigued. SOB: none. Wheezing: mild. OTC treatment: Robitussin without much relief. Cough is affecting sleep. No n/v. Normal PO intake.  ROS: As per HPI.   OBJECTIVE:  Vitals:   12/07/16 1322  BP: (!) 145/82  Pulse: 86  Resp: 16  Temp: 99.2 F (37.3 C)  TempSrc: Oral  SpO2: 100%     General appearance: alert; no distress HEENT: nasal congestion; clear runny nose; throat irritation secondary to post-nasal drainage Neck: supple without LAD Lungs: moving air well with mild exp wheezes bilaterally; no resp distress; dry cough Skin: warm and dry Psychological: alert and cooperative; normal mood and affect  No Known Allergies  Past Medical History:  Diagnosis Date  . Arthritis   . Breast CA ([place])    breast CA- right breast  . Diabetes mellitus   . Heel spur    bilateral  . Hyperlipidemia   . Hypertension   . Thrombocytopenia ([place])   . Thyroid disease    parathyroid removed   Social History    Social History  . Marital status: Married    Spouse name: N/A  . Number of children: 2  . Years of education: N/A   Occupational History  . Not on file.   Social History Main Topics  . Smoking status: Never Smoker  . Smokeless tobacco: Never Used  . Alcohol use No  . Drug use: No  . Sexual activity: Not Currently    Birth control/ protection: Surgical   Other Topics Concern  . Not on file   Social History Narrative   Pt lives with husband and in [place].   Does not work or go to school.   Walks daily    Exercises with walking in yard.   Drinks 2 cans sweetened soda daily.   Family History  Problem Relation Age of Onset  . CAD Mother 84       CABG  . Stroke Father   . Heart attack Brother 70  . Colon cancer Neg Hx   . Esophageal cancer Neg Hx   . Rectal cancer Neg Hx   . Stomach cancer Neg Hx            Vanessa Kick, MD 12/07/16 [phone]

## 2016-12-07 NOTE — ED Triage Notes (Signed)
Pt presents today with cough and congestion that started 2 days ago. States that she does have a productive cough and some wheezing. No SOB, fever, chills noted. Has tried OTC robitussin with some relief for the cough.

## 2016-12-12 ENCOUNTER — Ambulatory Visit (INDEPENDENT_AMBULATORY_CARE_PROVIDER_SITE_OTHER): Payer: 59 | Admitting: Internal Medicine

## 2016-12-12 ENCOUNTER — Other Ambulatory Visit: Payer: Self-pay

## 2016-12-12 ENCOUNTER — Encounter: Payer: Self-pay | Admitting: Internal Medicine

## 2016-12-12 DIAGNOSIS — E785 Hyperlipidemia, unspecified: Secondary | ICD-10-CM | POA: Diagnosis not present

## 2016-12-12 DIAGNOSIS — I10 Essential (primary) hypertension: Secondary | ICD-10-CM

## 2016-12-12 MED ORDER — CARVEDILOL 12.5 MG PO TABS: 12.5000 mg | ORAL_TABLET | Freq: Two times a day (BID) | ORAL | 0 refills | Status: DC

## 2016-12-12 MED ORDER — LISINOPRIL 40 MG PO TABS: 40.0000 mg | ORAL_TABLET | Freq: Every day | ORAL | 3 refills | Status: DC

## 2016-12-12 MED ORDER — SIMVASTATIN 10 MG PO TABS: 10.0000 mg | ORAL_TABLET | Freq: Every day | ORAL | 3 refills | Status: DC

## 2016-12-12 MED ORDER — HYDROCHLOROTHIAZIDE 12.5 MG PO CAPS: 12.5000 mg | ORAL_CAPSULE | Freq: Every day | ORAL | 1 refills | Status: DC

## 2016-12-12 NOTE — Assessment & Plan Note (Signed)
BP appears to be well controlled today and from home BP log. Will continue current medication regimen. Follow up in 3 months. Continue to follow with cardiology.

## 2016-12-12 NOTE — Progress Notes (Signed)
   Subjective:    [name] - 56 y.o. female MRN [id number]  Date of birth: Jun 27, 1960  HPI  [name] is here for follow up of chronic medical conditions.  Chronic HTN Disease Monitoring:  Home BP Monitoring - Yes. Checks daily. Log shows average blood pressures 115-135/70-80.  Chest pain- no  Dyspnea- no Headache - no  Medications: HCTZ 12.5 mg daily, Coreg 12.5 mg BID, Spironolactone 25 mg daily, Lisinopril 40 mg daily  Compliance- yes Lightheadedness- no  Edema- no   HYPERLIPIDEMIA  Symptoms Chest pain on exertion:  no    Leg claudication:   no  Medication Monitoring Compliance- no; discontinued Pravastatin about 2 weeks ago due to leg cramping, hand cramping, and arm cramping   Right upper quadrant pain- no   Muscle aches- yes    -  reports that  has never smoked. she has never used smokeless tobacco. - Review of Systems: Per HPI. - Past Medical History: Patient Active Problem List   Diagnosis Date Noted  . Arthritis of both hands 04/03/2016  . Obesity 08/30/2015  . 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 Cataracts, bilateral 09/13/2014  . Glaucoma 09/13/2014  . Mitral regurgitation 02/18/2014  . Transaminitis 11/13/2012  . Abdominal pain, left lower quadrant 04/17/2012  . Hyperlipidemia 04/04/2011  . Diabetes ([place]) 06/07/2010  . Cancer of central portion of right female breast ([place]) 10/04/2009  . Idiopathic thrombocytopenic purpura (ITP) 06/22/2009  . Essential hypertension 06/22/2009  . HYPERPARATHYROIDISM, HX OF 06/22/2009  . Avitaminosis D 09/01/2008   - Medications: reviewed and updated   Objective:   Physical Exam BP 132/78   Pulse 67   Temp 98.4 F (36.9 C) (Oral)   Wt 207 lb 6.4 oz (94.1 kg)   SpO2 99%   BMI 36.74 kg/m  Gen: NAD, alert, cooperative with exam, well-appearing CV: RRR, good S1/S2, no murmur, no edema, capillary refill brisk  Resp: CTABL, no wheezes, non-labored    Assessment & Plan:    Essential hypertension BP appears to be well controlled today and from home BP log. Will continue current medication regimen. Follow up in 3 months. Continue to follow with cardiology.   Hyperlipidemia Have advised patient not to resume Pravastatin due to myalgias. She reports that myalgias resolved after discontinuation of statin. From chart review she has had intolerance to Lipitor and Crestor in the past secondary to myalgias. Will attempt trial with low dose Simvastatin 10 mg. Can titrate upwards if needed. Return in 3 months for lipid panel check.     Phill Myron, D.O. 12/12/2016, 2:02 PM PGY-3, [place]

## 2016-12-12 NOTE — Assessment & Plan Note (Signed)
Have advised patient not to resume Pravastatin due to myalgias. She reports that myalgias resolved after discontinuation of statin. From chart review she has had intolerance to Lipitor and Crestor in the past secondary to myalgias. Will attempt trial with low dose Simvastatin 10 mg. Can titrate upwards if needed. Return in 3 months for lipid panel check.

## 2016-12-12 NOTE — Patient Instructions (Addendum)
Wait about two hours to check your blood pressure after taking your medications. Keep a blood pressure log for me. Call me if your BP <90/60 or >130/80.   I have prescribed Simvastatin for your cholesterol at a low dose. Try this medication and see if you have muscle cramps/aches. If you tolerate it we can try to increase the dose in the future if needed.

## 2016-12-24 DIAGNOSIS — H25013 Cortical age-related cataract, bilateral: Secondary | ICD-10-CM | POA: Diagnosis not present

## 2016-12-24 DIAGNOSIS — E119 Type 2 diabetes mellitus without complications: Secondary | ICD-10-CM | POA: Diagnosis not present

## 2017-02-03 IMAGING — CR DG CHEST 2V
2 series · 2 of 2 positions shown · non-contrast
Comparison: PA and lateral chest of March 19, 2014

CLINICAL DATA: Wheezing and sinus drainage without fever, history
of right mastectomy, nonsmoker.

EXAM:
CHEST  2 VIEW

[w chest pa]
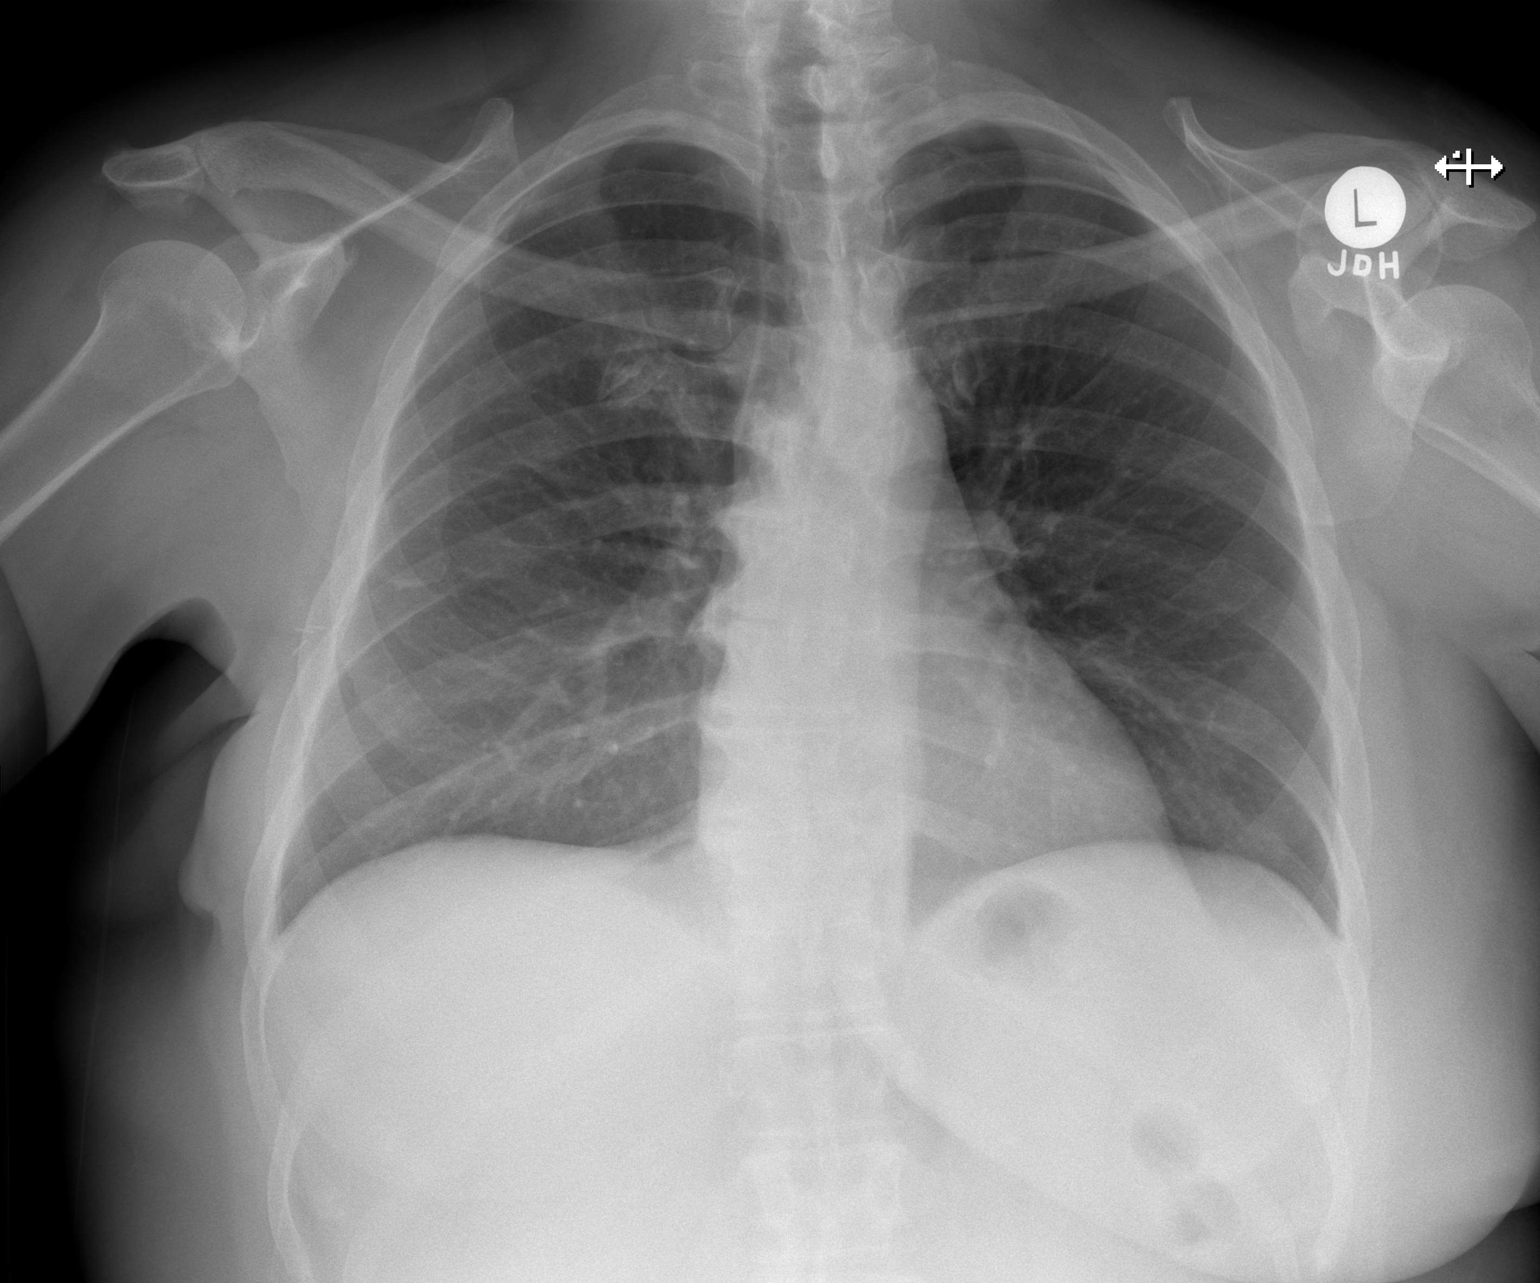

[w chest lat]
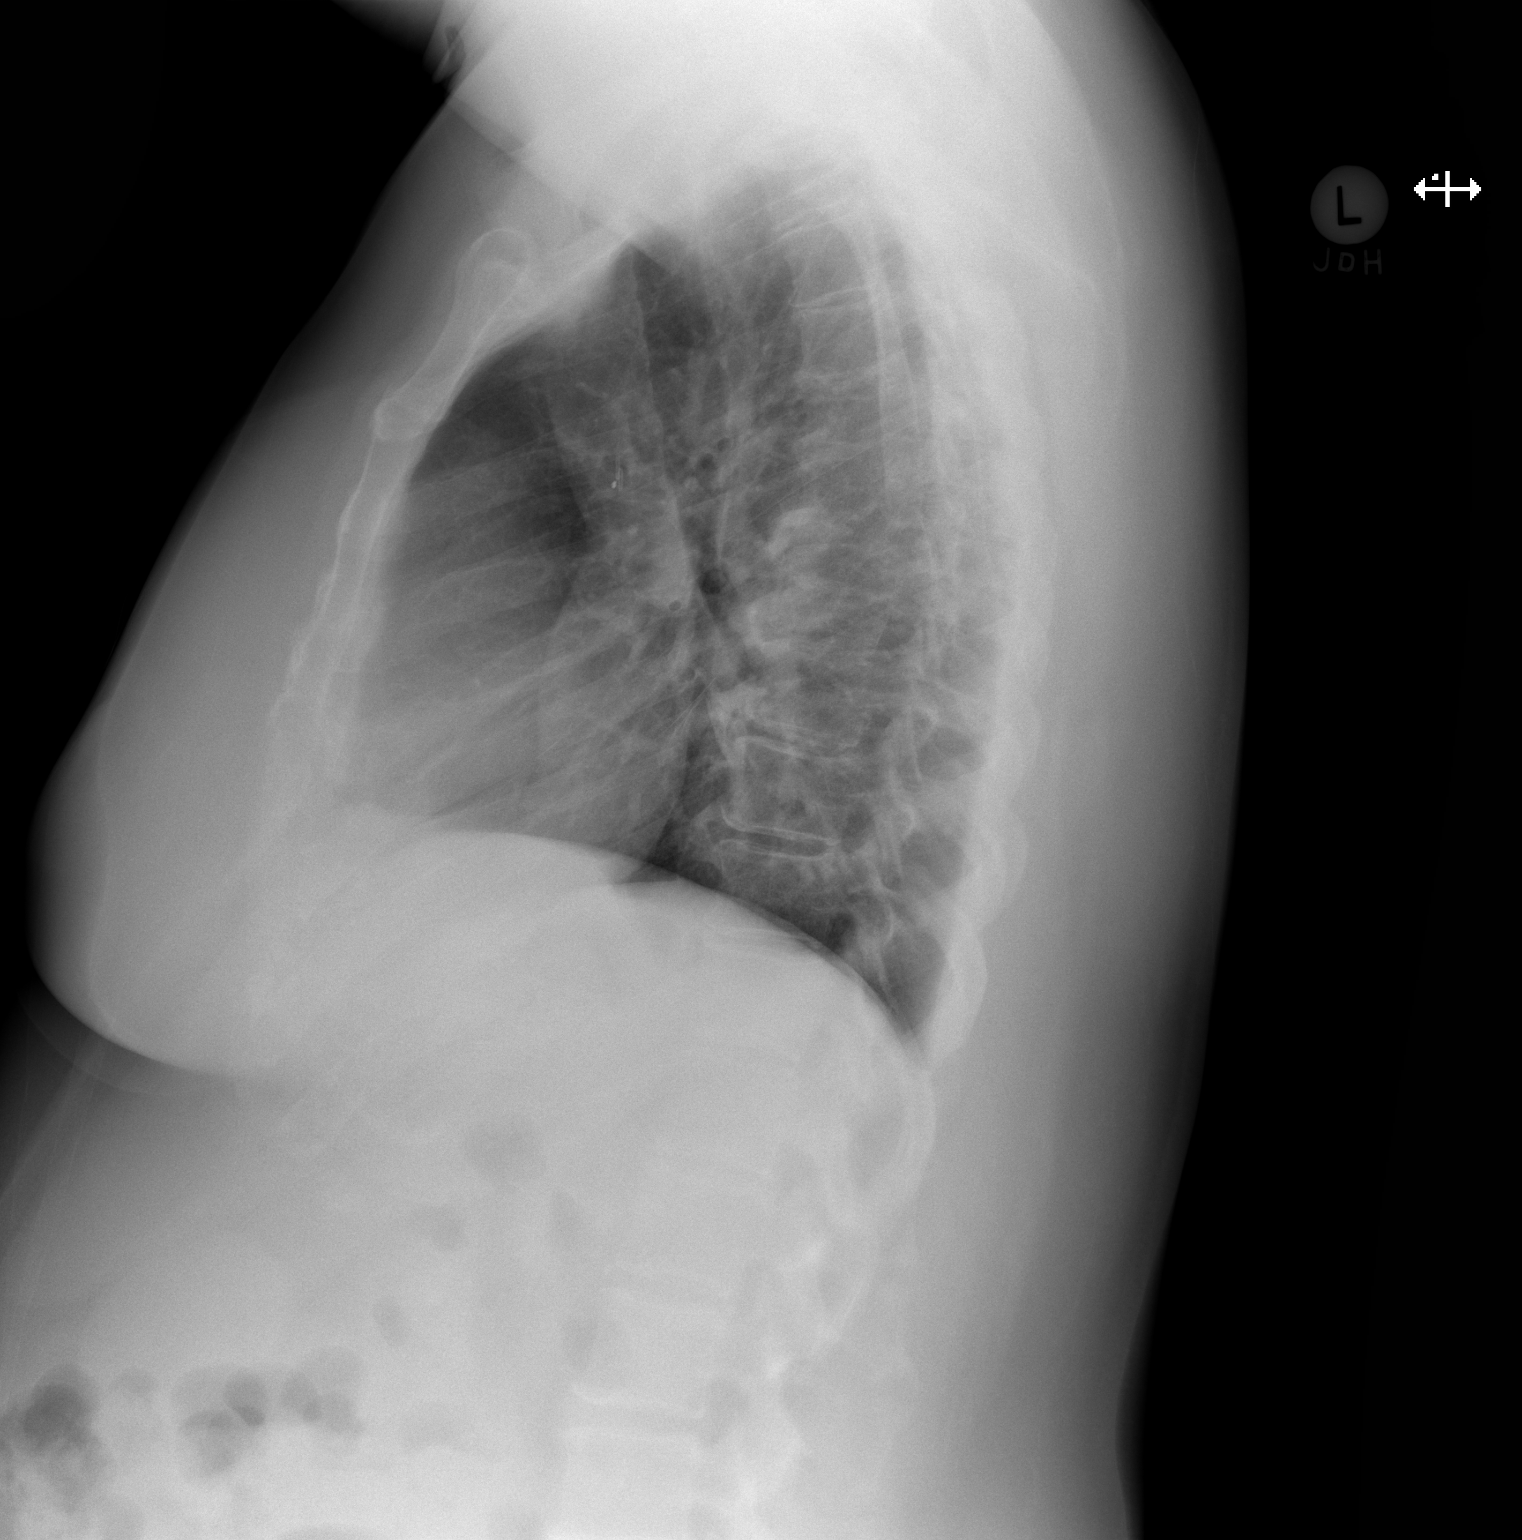

[2 of 2 positions shown; findings below may reference images not displayed]

FINDINGS: The lungs are adequately inflated. There is no focal infiltrate. The
interstitial markings are normal. The heart and pulmonary
vascularity are normal. There is no pleural effusion or
pneumothorax. There is mild multilevel degenerative disc disease of
the thoracic spine with mild dextrocurvature.
IMPRESSION: There is no pneumonia nor other acute cardiopulmonary abnormality.

## 2017-03-04 ENCOUNTER — Other Ambulatory Visit: Payer: Self-pay | Admitting: Internal Medicine

## 2017-03-04 DIAGNOSIS — I10 Essential (primary) hypertension: Secondary | ICD-10-CM

## 2017-03-14 ENCOUNTER — Other Ambulatory Visit: Payer: Self-pay

## 2017-03-14 ENCOUNTER — Encounter: Payer: Self-pay | Admitting: Internal Medicine

## 2017-03-14 ENCOUNTER — Ambulatory Visit (INDEPENDENT_AMBULATORY_CARE_PROVIDER_SITE_OTHER): Payer: 59 | Admitting: Internal Medicine

## 2017-03-14 VITALS — BP 128/80 | HR 81 | Temp 98.7°F | Ht 63.0 in | Wt 204.0 lb

## 2017-03-14 DIAGNOSIS — E11319 Type 2 diabetes mellitus with unspecified diabetic retinopathy without macular edema: Secondary | ICD-10-CM | POA: Diagnosis not present

## 2017-03-14 DIAGNOSIS — E785 Hyperlipidemia, unspecified: Secondary | ICD-10-CM

## 2017-03-14 LAB — POCT GLYCOSYLATED HEMOGLOBIN (HGB A1C): Hemoglobin A1C: 6.4

## 2017-03-14 MED ORDER — METFORMIN HCL ER 500 MG PO TB24: 500.0000 mg | ORAL_TABLET | Freq: Every day | ORAL | 1 refills | Status: DC

## 2017-03-14 NOTE — Progress Notes (Signed)
POC4 

## 2017-03-14 NOTE — Patient Instructions (Signed)
Good to see you today! Great work on the diabetes and weight loss. I have decreased your Metformin to 500 mg. Please return in 3 months for a check up on your diabetes.   Stop taking your Simvastatin due to the muscle aches. We will check the lipid panel today.

## 2017-03-15 LAB — LIPID PANEL
CHOL/HDL RATIO: 3 ratio (ref 0.0–4.4)
Cholesterol, Total: 187 mg/dL (ref 100–199)
HDL: 63 mg/dL (ref >39)
LDL Calculated: 83 mg/dL (ref 0–99)
TRIGLYCERIDES: 207 mg/dL — AB (ref 0–149)
VLDL CHOLESTEROL CAL: 41 mg/dL — AB (ref 5–40)

## 2017-03-15 NOTE — Assessment & Plan Note (Signed)
>>  ASSESSMENT AND PLAN FOR DM (DIABETES MELLITUS) WITH COMPLICATIONS ([facility]) WRITTEN ON 03/15/2017  3:59 PM BY WALLACE, CATHERINE LAUREN, DO  A1c continues to remain well controlled with result of 6.4%. Patient down 3 lbs in 3 months. She wants to continue to work on weight loss. Have decreased Metformin  to 500 mg ER from 750 mg ER as A1c has been very well controlled for several previous values. Will follow up A1c in 3 months. Encouraged diabetic eye exam.

## 2017-03-15 NOTE — Assessment & Plan Note (Signed)
A1c continues to remain well controlled with result of 6.4%. Patient down 3 lbs in 3 months. She wants to continue to work on weight loss. Have decreased Metformin to 500 mg ER from 750 mg ER as A1c has been very well controlled for several previous values. Will follow up A1c in 3 months. Encouraged diabetic eye exam.

## 2017-03-15 NOTE — Assessment & Plan Note (Signed)
Patient with history of intolerance to multiple statins including Lipitor, Crestor, and Pravastatin due to myalgias. Have recommended discontinuation of Simvastatin 10 mg today due to myalgias. Will obtain repeat lipid panel today. Patient would likely benefit from statin therapy due to diabetes. Have asked patient to make appointment with pharmacy team to discuss medication options. Could try something such as every other day dosing of a statin. Could also consider starting ASA for primary prevention.

## 2017-03-15 NOTE — Progress Notes (Signed)
   Subjective:    [name] - 57 y.o. female MRN [id number]  Date of birth: 1960/02/16  HPI  [name] is here for follow up.  Diabetes mellitus, Type 2 Disease Monitoring Blood Sugar Ranges: less than 160, usually 110-130  Polyuria: no Visual problems: no   Urine Microalbumin: On Lisinopril   Last A1C: 6.3 (July 2018)   Medication Compliance: yes  Medication Side Effects Hypoglycemia: no   Preventitive Health Care Eye Exam: In Need  Foot Exam: Performed today   Hyperlipidemia: Patient was started on Simvastatin 10 mg in Nov 2018. She reports continued leg cramping with resumption of statin therapy. She wants to be on the appropriate medications to prevent MI/CVAs.     -  reports that  has never smoked. she has never used smokeless tobacco. - Review of Systems: Per HPI. - Past Medical History: Patient Active Problem List   Diagnosis Date Noted  . Arthritis of both hands 04/03/2016  . Obesity 08/30/2015  . 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 Cataracts, bilateral 09/13/2014  . Glaucoma 09/13/2014  . Mitral regurgitation 02/18/2014  . Transaminitis 11/13/2012  . Hyperlipidemia 04/04/2011  . Diabetes ([name]) 06/07/2010  . Cancer of central portion of right female breast ([place]) 10/04/2009  . Idiopathic thrombocytopenic purpura (ITP) 06/22/2009  . Essential hypertension 06/22/2009  . HYPERPARATHYROIDISM, HX OF 06/22/2009  . Avitaminosis D 09/01/2008   - Medications: reviewed and updated   Objective:   Physical Exam BP 128/80 (BP Location: Left Arm, Patient Position: Sitting, Cuff Size: Large)   Pulse 81   Temp 98.7 F (37.1 C) (Oral)   Ht 5\' 3"  (1.6 m)   Wt 204 lb (92.5 kg)   SpO2 100%   BMI 36.14 kg/m  Gen: NAD, alert, cooperative with exam, well-appearing CV: RRR, good S1/S2, no murmur, no edema, capillary refill brisk  Resp: CTABL, no wheezes,  non-labored   Diabetic Foot Check -  Appearance - no lesions, ulcers or calluses Skin - no unusual pallor or redness Monofilament testing - normal bilaterally  Right - Great toe, medial, central, lateral ball and posterior foot intact Left - Great toe, medial, central, lateral ball and posterior foot intact     Assessment & Plan:   Diabetes (HCC) A1c continues to remain well controlled with result of 6.4%. Patient down 3 lbs in 3 months. She wants to continue to work on weight loss. Have decreased Metformin to 500 mg ER from 750 mg ER as A1c has been very well controlled for several previous values. Will follow up A1c in 3 months. Encouraged diabetic eye exam.   Hyperlipidemia Patient with history of intolerance to multiple statins including Lipitor, Crestor, and Pravastatin due to myalgias. Have recommended discontinuation of Simvastatin 10 mg today due to myalgias. Will obtain repeat lipid panel today. Patient would likely benefit from statin therapy due to diabetes. Have asked patient to make appointment with pharmacy team to discuss medication options. Could try something such as every other day dosing of a statin. Could also consider starting ASA for primary prevention.     Phill Myron, D.O. 03/15/2017, 4:06 PM PGY-3, [place]

## 2017-03-30 ENCOUNTER — Emergency Department (HOSPITAL_COMMUNITY)
Admission: EM | Admit: 2017-03-30 | Discharge: 2017-03-30 | Disposition: A | Discharge status: Home / Self Care | Payer: 59 | Attending: Emergency Medicine | Admitting: Emergency Medicine

## 2017-03-30 ENCOUNTER — Encounter (HOSPITAL_COMMUNITY): Payer: Self-pay | Admitting: Emergency Medicine

## 2017-03-30 DIAGNOSIS — M25551 Pain in right hip: Secondary | ICD-10-CM | POA: Diagnosis present

## 2017-03-30 DIAGNOSIS — M5431 Sciatica, right side: Secondary | ICD-10-CM

## 2017-03-30 DIAGNOSIS — I10 Essential (primary) hypertension: Secondary | ICD-10-CM | POA: Diagnosis not present

## 2017-03-30 DIAGNOSIS — E119 Type 2 diabetes mellitus without complications: Secondary | ICD-10-CM | POA: Insufficient documentation

## 2017-03-30 DIAGNOSIS — Z7984 Long term (current) use of oral hypoglycemic drugs: Secondary | ICD-10-CM | POA: Insufficient documentation

## 2017-03-30 MED ORDER — ACETAMINOPHEN 325 MG PO TABS: 650.0000 mg | ORAL_TABLET | Freq: Four times a day (QID) | ORAL | 0 refills | Status: DC | PRN

## 2017-03-30 NOTE — ED Triage Notes (Signed)
Pt to ER for nontraumatic hip pain onset one month ago. Ambulatory without difficulty. States "I feel like its locking up." NAD.

## 2017-03-30 NOTE — ED Provider Notes (Signed)
[facility] EMERGENCY DEPARTMENT Provider Note   CSN: [id number] Arrival date & time: 03/30/17  6283     History   Chief Complaint Chief Complaint  Patient presents with  . Hip Pain    HPI Berthe Oley is a 57 y.o. female.  HPI   Patient is a 57 year old female who presents the emergency department today complaining of right buttock pain that radiates down the posterior aspect of the leg.  Rates pain as 7/10 when she has it.  States that it is "grabbing".  It is worse when she sits for an extended period of time.  It is better when she gets up and walks around or takes Tylenol.  Has use warm compresses which are not helpful.  States that she had this pain this morning, however after arriving to the ER she no longer has any pain. Denies any fevers, unexpected or unplanned weight loss.  No numbness/weakness/tingling to the bilateral lower extremities.  Denies saddle anesthesia. Denies loss of control of bowels or bladder. No urinary retention.   Past Medical History:  Diagnosis Date  . Arthritis   . Breast CA ([place])    breast CA- right breast  . Diabetes mellitus   . Heel spur    bilateral  . Hyperlipidemia   . Hypertension   . Thrombocytopenia ([place])   . Thyroid disease    parathyroid removed    Patient Active Problem List   Diagnosis Date Noted  . Arthritis of both hands 04/03/2016  . Obesity 08/30/2015  . 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 Cataracts, bilateral 09/13/2014  . Glaucoma 09/13/2014  . Mitral regurgitation 02/18/2014  . Transaminitis 11/13/2012  . Hyperlipidemia 04/04/2011  . Diabetes ([place]) 06/07/2010  . Cancer of central portion of right female breast ([place]) 10/04/2009  . Idiopathic thrombocytopenic purpura (ITP) 06/22/2009  . Essential hypertension 06/22/2009  . HYPERPARATHYROIDISM, HX OF 06/22/2009  . Avitaminosis D 09/01/2008    Past Surgical History:  Procedure Laterality Date  . ABDOMINAL HYSTERECTOMY     1996 per pt  . BREAST BIOPSY Right 06/2009  . BREAST SURGERY  09/12/10   excision of skin mass - right breast, had another surgery to remove more Cancer cells  . INNER EAR SURGERY  06/01/13  . MASTECTOMY Right 07/21/2009  . PARATHYROIDECTOMY     partial?  . REDUCTION MAMMAPLASTY Left 2013    OB History    No data available       Home Medications    Prior to Admission medications   Medication Sig Start Date End Date Taking? Authorizing Provider  acetaminophen (TYLENOL) 325 MG tablet Take 2 tablets (650 mg total) by mouth every 6 (six) hours as needed. 03/30/17   Theus Espin S, PA-C  Blood Glucose Monitoring Suppl (ONE TOUCH ULTRA 2) w/Device KIT USE   TO CHECK GLUCOSE UP TO THREE TIMES DAILY AS NEEDED 10/11/15   McKeag, Marylynn Pearson, MD  carvedilol (COREG) 12.5 MG tablet TAKE 1 TABLET BY MOUTH TWICE DAILY WITH A MEAL 03/05/17   Nicolette Bang, DO  glucose blood (ONE TOUCH ULTRA TEST) test strip Check blood sugar once daily 10/27/15   Haney, Alyssa A, MD  hydrochlorothiazide (MICROZIDE) 12.5 MG capsule Take 1 capsule (12.5 mg total) daily by mouth. 12/12/16   Nicolette Bang, DO  HYDROcodone-homatropine (HYCODAN) 5-1.5 MG/5ML syrup Take 5 mLs by mouth every 6 (six) hours as needed for cough. 12/07/16   Vanessa Kick, MD  Lancets [facility] ULTRASOFT)  lancets Check daily as needed 07/19/15   Olam Idler, MD  lisinopril (PRINIVIL,ZESTRIL) 40 MG tablet Take 1 tablet (40 mg total) daily by mouth. 12/12/16   Nicolette Bang, DO  metFORMIN (GLUCOPHAGE-XR) 500 MG 24 hr tablet Take 1 tablet (500 mg total) by mouth daily with breakfast. 03/14/17   Nicolette Bang, DO  predniSONE (STERAPRED UNI-PAK 21 TAB) 10 MG (21) TBPK tablet Take by mouth daily. Take as directed. 12/07/16   Vanessa Kick, MD  simvastatin (ZOCOR) 10 MG tablet Take 1 tablet (10 mg total) at bedtime by mouth. 12/12/16   Nicolette Bang, DO  spironolactone (ALDACTONE) 25 MG tablet Take 25 mg by  mouth daily.    [provider]  spironolactone (ALDACTONE) 25 MG tablet TAKE 1 TABLET BY MOUTH ONCE DAILY 11/25/16   Minus Breeding, MD  tamoxifen (NOLVADEX) 20 MG tablet Take 1 tablet (20 mg total) by mouth daily. 11/22/16   Nicholas Lose, MD  traMADol (ULTRAM) 50 MG tablet Take 1 tablet (50 mg total) by mouth 2 (two) times daily as needed. 10/02/16   Nicolette Bang, DO  rosuvastatin (CRESTOR) 5 MG tablet Take 1 tablet (5 mg total) by mouth daily. Patient not taking: Reported on 04/07/2015 02/17/15 04/07/15  Olam Idler, MD    Family History Family History  Problem Relation Age of Onset  . CAD Mother 46       CABG  . Stroke Father   . Heart attack Brother 91  . Colon cancer Neg Hx   . Esophageal cancer Neg Hx   . Rectal cancer Neg Hx   . Stomach cancer Neg Hx     Social History Social History   Tobacco Use  . Smoking status: Never Smoker  . Smokeless tobacco: Never Used  Substance Use Topics  . Alcohol use: No  . Drug use: No     Allergies   Patient has no known allergies.   Review of Systems Review of Systems  Constitutional: Negative for fever and unexpected weight change.  Respiratory: Negative for shortness of breath.   Cardiovascular: Negative for chest pain.  Genitourinary:       No loss of control of bowels or bladder.  Musculoskeletal:       Right buttock pain  Neurological: Negative for weakness and numbness.     Physical Exam Updated Vital Signs BP (!) 135/59 (BP Location: Left Arm)   Pulse 78   Temp 98.7 F (37.1 C) (Oral)   Resp 18   Ht 5' 4" (1.626 m)   Wt 91.2 kg (201 lb)   SpO2 100%   BMI 34.50 kg/m   Physical Exam  Constitutional: She appears well-developed and well-nourished. No distress.  HENT:  Head: Normocephalic and atraumatic.  Eyes: Conjunctivae are normal.  Neck: Neck supple.  Cardiovascular: Normal rate.  Pulmonary/Chest: Effort normal.  Musculoskeletal: Normal range of motion.  No midline tenderness  to the cervical, thoracic, or lumbar spine.  No tenderness across the iliac crest.  There is no tenderness to the sciatic notch or buttock.  Patient is able to ambulate without difficulty on toes and heels.  5 out of 5 strength to bilateral lower extremities.  Sensation intact and equal bilaterally.  Patellar DTRs 2+ bilaterally.  Neurological: She is alert.  Skin: Skin is warm and dry.  Psychiatric: She has a normal mood and affect.  Nursing note and vitals reviewed.    ED Treatments / Results  Labs (all labs ordered are  listed, but only abnormal results are displayed) Labs Reviewed - No data to display  EKG  EKG Interpretation None       Radiology No results found.  Procedures Procedures (including critical care time)  Medications Ordered in ED Medications - No data to display   Initial Impression / Assessment and Plan / ED Course  I have reviewed the triage vital signs and the nursing notes.  Pertinent labs & imaging results that were available during my care of the patient were reviewed by me and considered in my medical decision making (see chart for details).     Final Clinical Impressions(s) / ED Diagnoses   Final diagnoses:  Sciatica of right side    57 year old female presenting with right buttock pain that has been intermittent over the last month.  She has no pain today in the ER, and has no tenderness on exam.  Vital signs stable.  Afebrile.  Patient does have history of cancer, but she has no bony tenderness, has had no fevers, has had no weight loss, and has been in remission for 6 years.  Her pain seems to be located in her right buttock, and not to her midline back or along the bony prominences of the pelvis.  Pain is intermittent, and is only present after sitting for long periods of time.  Doubt any bony pathology or metastases.  This is more likely sciatica.  I gave the patient strict instructions to follow-up with her primary care provider to ensure  resolution of her symptoms, advised her to have further evaluation if pain becomes more persistent, she experiences fevers, she has any weight loss, or has any other associated red flag symptoms associated with his back pain.  I will give anti-inflammatories.  Discussed return precautions.  Patient and her husband are aware of the plan and understand reasons to return.  ED Discharge Orders        Ordered    acetaminophen (TYLENOL) 325 MG tablet  Every 6 hours PRN     03/30/17 1125       Couture, Cortni S, PA-C 03/30/17 1126    Lajean Saver, MD 03/30/17 1329

## 2017-03-30 NOTE — Discharge Instructions (Signed)
Please take Tylenol every 6 hours as needed.  Please follow-up with your primary care doctor as we discussed within 1 week.    Return to the emergency department immediately if you experience any back pain associated with fevers, loss of control of your bowels/bladder, weakness/numbness to your legs, numbness to your groin area, inability to walk, or inability to urinate.

## 2017-03-31 ENCOUNTER — Other Ambulatory Visit: Payer: Self-pay | Admitting: Hematology and Oncology

## 2017-03-31 DIAGNOSIS — Z1231 Encounter for screening mammogram for malignant neoplasm of breast: Secondary | ICD-10-CM

## 2017-04-03 ENCOUNTER — Ambulatory Visit: Payer: Medicaid Other | Admitting: Pharmacist

## 2017-04-10 ENCOUNTER — Ambulatory Visit: Payer: Medicaid Other | Admitting: Pharmacist

## 2017-04-14 ENCOUNTER — Ambulatory Visit: Payer: Medicaid Other | Admitting: Pharmacist

## 2017-04-24 ENCOUNTER — Encounter: Payer: Self-pay | Admitting: Pharmacist

## 2017-04-24 ENCOUNTER — Ambulatory Visit (INDEPENDENT_AMBULATORY_CARE_PROVIDER_SITE_OTHER): Payer: 59 | Admitting: Pharmacist

## 2017-04-24 DIAGNOSIS — E785 Hyperlipidemia, unspecified: Secondary | ICD-10-CM | POA: Diagnosis not present

## 2017-04-24 MED ORDER — ROSUVASTATIN CALCIUM 10 MG PO TABS: 10.0000 mg | ORAL_TABLET | ORAL | 1 refills | Status: DC

## 2017-04-24 NOTE — Progress Notes (Signed)
Patient ID: [name], female   DOB: [birth year], 57 y.o.   MRN: [id number] Reviewed: I agree with Dr. Graylin Shiver documentation and management.

## 2017-04-24 NOTE — Assessment & Plan Note (Signed)
Increased LDL cholesterol, hypercholesterolemia, in a patient with multiple statin intolerances with daily dosing.  Patient is willing to trial rosuvastatin 10mg  once weekly for the next four weeks. Plan will be to increase to twice weekly for weeks 5-8 and then consider dosing at three times per week.  Patient was agreeable with this dosing and verbalized understanding of treatment plan.  Obtained OFF treatment LDL direct today for future comparison.  Will reevaluation LDL once highest tolerable dose of rosuvastatin is determined.

## 2017-04-24 NOTE — Patient Instructions (Addendum)
Thank you for coming to visit [place] today at the clinic!  We are going to get some lab work to see your LDL (bad cholesterol today)! We can call you with the results when they come in!  We are sorry to hear about your muscle pains but it is very important to lower your bad cholesterol and the best way is through statin medications.  Dr. Valentina Lucks would like to re-start you on once weekly Rosuvastatin 10 mg. Please let the clinic know at the next visit if you experience any muscle aches or pains.  See you in 1 month to see Dr. Valentina Lucks for follow up!

## 2017-04-24 NOTE — Progress Notes (Addendum)
   S:    Patient arrives in good spirits, ambulating without assistance.  Presents to the clinic for evaluation and management of "Statin Intolerance". Patient was referred by her PCP, Dr. Juleen China on 03/14/2017.    Patient reports adherence with all medications. Reports that she last took simvastatin about 1 month ago.  She states that her muscle aches have resolved.  Her hip has some mild pain.     Statin tried in the past include: atorvastatin, rosuvastatin, pravastatin and most recently simvastatin.  Daily dosing of these statins were all reported to cause thigh muscle aches.    O:  Last 3 Office BP readings: BP Readings from Last 3 Encounters:  04/24/17 118/70  03/30/17 (!) 135/59  03/14/17 128/80    BMET    Component Value Date/Time   NA 140 11/22/2016 0900   K 4.5 11/22/2016 0900   CL 102 08/28/2016 1424   CL 105 02/21/2012 0806   CO2 28 11/22/2016 0900   GLUCOSE 109 11/22/2016 0900   GLUCOSE 135 (H) 02/21/2012 0806   BUN 13.6 11/22/2016 0900   CREATININE 0.9 11/22/2016 0900   CALCIUM 9.9 11/22/2016 0900   GFRNONAA 64 08/28/2016 1424   GFRNONAA 67 08/15/2015 1342   GFRAA 74 08/28/2016 1424   GFRAA 77 08/15/2015 1342   Lipid Panel     Component Value Date/Time   CHOL 187 03/14/2017 1414   TRIG 207 (H) 03/14/2017 1414   HDL 63 03/14/2017 1414   CHOLHDL 3.0 03/14/2017 1414   CHOLHDL 4.0 02/11/2014 0915   VLDL 67 (H) 02/11/2014 0915   LDLCALC 83 03/14/2017 1414    A/P: Increased LDL cholesterol, hypercholesterolemia, in a patient with multiple statin intolerances with daily dosing.  Patient is willing to trial rosuvastatin 10mg  once weekly for the next four weeks. Plan will be to increase to twice weekly for weeks 5-8 and then consider dosing at three times per week.  Patient was agreeable with this dosing and verbalized understanding of treatment plan.  Obtained OFF treatment LDL direct today for future comparison.  Will reevaluation LDL once highest tolerable dose  of rosuvastatin is determined.  Written information provided.   Total time in face-to-face counseling 25 minutes.  F/U Clinic Visit in Rx clinic in 4 weeks.    Patient seen with Hildred Alamin, PharmD Candidate.  LDL - 124 OFF statin Tx  Baseline OFF drug.

## 2017-04-25 LAB — LDL CHOLESTEROL, DIRECT: LDL Direct: 124 mg/dL — ABNORMAL HIGH (ref 0–99)

## 2017-04-28 ENCOUNTER — Other Ambulatory Visit: Payer: Self-pay

## 2017-04-28 DIAGNOSIS — I10 Essential (primary) hypertension: Secondary | ICD-10-CM

## 2017-04-28 NOTE — Telephone Encounter (Signed)
[facility] calling on behalf of patient, requesting refill of carvedilol. [address]

## 2017-04-29 MED ORDER — CARVEDILOL 12.5 MG PO TABS: ORAL_TABLET | ORAL | 0 refills | Status: DC

## 2017-05-01 ENCOUNTER — Ambulatory Visit
Admission: RE | Admit: 2017-05-01 | Discharge: 2017-05-01 | Disposition: A | Discharge status: Home / Self Care | Payer: 59 | Source: Ambulatory Visit | Attending: Hematology and Oncology | Admitting: Hematology and Oncology

## 2017-05-01 DIAGNOSIS — Z1231 Encounter for screening mammogram for malignant neoplasm of breast: Secondary | ICD-10-CM | POA: Diagnosis not present

## 2017-05-16 DIAGNOSIS — M71571 Other bursitis, not elsewhere classified, right ankle and foot: Secondary | ICD-10-CM | POA: Diagnosis not present

## 2017-05-16 DIAGNOSIS — M7661 Achilles tendinitis, right leg: Secondary | ICD-10-CM | POA: Diagnosis not present

## 2017-05-22 ENCOUNTER — Ambulatory Visit: Payer: Medicaid Other | Admitting: Pharmacist

## 2017-06-10 ENCOUNTER — Other Ambulatory Visit: Payer: Self-pay

## 2017-06-11 DIAGNOSIS — M722 Plantar fascial fibromatosis: Secondary | ICD-10-CM | POA: Diagnosis not present

## 2017-06-11 MED ORDER — TRAMADOL HCL 50 MG PO TABS: 50.0000 mg | ORAL_TABLET | Freq: Two times a day (BID) | ORAL | 1 refills | Status: DC | PRN

## 2017-06-16 NOTE — Addendum Note (Signed)
Addended by: Esau Grew on: 06/16/2017 04:59 PM   Modules accepted: Orders

## 2017-06-16 NOTE — Telephone Encounter (Signed)
Please resend prescription electronically.  Danley Danker, RN [facility] RN)

## 2017-06-17 ENCOUNTER — Other Ambulatory Visit: Payer: Self-pay

## 2017-06-17 MED ORDER — TRAMADOL HCL 50 MG PO TABS: 50.0000 mg | ORAL_TABLET | Freq: Two times a day (BID) | ORAL | 1 refills | Status: DC | PRN

## 2017-06-19 ENCOUNTER — Encounter: Payer: Self-pay | Admitting: Internal Medicine

## 2017-06-19 ENCOUNTER — Other Ambulatory Visit: Payer: Self-pay

## 2017-06-19 ENCOUNTER — Ambulatory Visit (INDEPENDENT_AMBULATORY_CARE_PROVIDER_SITE_OTHER): Payer: Medicare Other | Admitting: Internal Medicine

## 2017-06-19 VITALS — BP 120/84 | HR 71 | Temp 98.7°F | Ht 64.0 in | Wt 202.0 lb

## 2017-06-19 DIAGNOSIS — E11319 Type 2 diabetes mellitus with unspecified diabetic retinopathy without macular edema: Secondary | ICD-10-CM

## 2017-06-19 DIAGNOSIS — E785 Hyperlipidemia, unspecified: Secondary | ICD-10-CM

## 2017-06-19 DIAGNOSIS — I10 Essential (primary) hypertension: Secondary | ICD-10-CM | POA: Diagnosis not present

## 2017-06-19 LAB — POCT GLYCOSYLATED HEMOGLOBIN (HGB A1C): HEMOGLOBIN A1C: 6.7

## 2017-06-19 NOTE — Assessment & Plan Note (Signed)
A1c slightly increased to 6.7% in 3 months from 6.4%. Metformin was decreased in dose. Patient still below goal A1c. Have recommended continuation of medication for now with repeat A1c in 3 months.

## 2017-06-19 NOTE — Assessment & Plan Note (Signed)
Patient currently tolerating Crestor 10 mg three times weekly. Has a history of multiple statin intolerances with daily dosing. Baseline LDL off statin therapy was 124. Recommended follow up with pharmacy clinic to see if further adjustment of medication should be attempted. Will need repeat LDL when highest tolerable dose of Crestor is determined.

## 2017-06-19 NOTE — Patient Instructions (Signed)
Your A1c went up slightly with the reduction in the dose of your Metformin but is still nicely below your goal of <7.0. I would recommend continuing the medication as is and repeating your A1c in 3 months.

## 2017-06-19 NOTE — Assessment & Plan Note (Signed)
>>  ASSESSMENT AND PLAN FOR DM (DIABETES MELLITUS) WITH COMPLICATIONS (HCC) WRITTEN ON 06/19/2017  4:52 PM BY WALLACE, CATHERINE LAUREN, DO  A1c slightly increased to 6.7% in 3 months from 6.4%. Metformin  was decreased in dose. Patient still below goal A1c. Have recommended continuation of medication for now with repeat A1c in 3 months.

## 2017-06-19 NOTE — Assessment & Plan Note (Signed)
BP very well controlled today. Continue current medication regimen.

## 2017-06-19 NOTE — Progress Notes (Signed)
   Subjective:    [name] - 57 y.o. female MRN [id number]  Date of birth: [birth year]  HPI  [name] is here for follow up of chronic medical conditions.  Diabetes mellitus, Type 2 Disease Monitoring Blood Sugar Ranges: typically less than 130  Polyuria: no Visual problems: no              Urine Microalbumin: On Lisinopril              Last A1C: 6.4 (Feb 2019)   Medication Compliance: yes, Metformin ER decreased to 500 mg from 750 mg in Feb 2019  Medication Side Effects Hypoglycemia: no   [place] Exam: In Need  Foot Exam: Performed today   Hyperlipidemia: Simvastatin 10 mg was discontinued in Feb 2019 due to leg cramping. Pharmacy clinic saw patient in March, she was started on Crestor 10 mg once weekly. She has been able to increase to three times weekly dosing without adverse effects.   Chronic HTN Disease Monitoring:  Home BP Monitoring - yes checks daily, BP has been well controlled at home  Chest pain- no  Dyspnea- no Headache - no  Medications:  HCTZ 12.5 mgdaily, Coreg 12.5 mg BID, Spironolactone 25 mgdaily,Lisinopril 40 mg daily Compliance- yes Lightheadedness- no  Edema- no    -  reports that she has never smoked. She has never used smokeless tobacco. - Review of Systems: Per HPI. - Past Medical History: Patient Active Problem List   Diagnosis Date Noted  . Arthritis of both hands 04/03/2016  . Obesity 08/30/2015  . 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 Cataracts, bilateral 09/13/2014  . Glaucoma 09/13/2014  . Mitral regurgitation 02/18/2014  . Transaminitis 11/13/2012  . Hyperlipidemia 04/04/2011  . Diabetes ([place]) 06/07/2010  . Cancer of central portion of right female breast ([place]) 10/04/2009  . Idiopathic thrombocytopenic purpura (ITP) ([place]) 06/22/2009  . Essential hypertension 06/22/2009  . HYPERPARATHYROIDISM,  HX OF 06/22/2009  . Avitaminosis D 09/01/2008   - Medications: reviewed and updated   Objective:   Physical Exam BP 120/84   Pulse 71   Temp 98.7 F (37.1 C) (Oral)   Ht 5\' 4"  (1.626 m)   Wt 202 lb (91.6 kg)   SpO2 97%   BMI 34.67 kg/m  Gen: NAD, alert, cooperative with exam, well-appearing CV: RRR, good S1/S2, no murmur, no edema, capillary refill brisk  Resp: CTABL, no wheezes, non-labored Skin: no rashes, normal turgor  Neuro: no gross deficits.  Psych: good insight, alert and oriented    Assessment & Plan:   Diabetes ([place]) A1c slightly increased to 6.7% in 3 months from 6.4%. Metformin was decreased in dose. Patient still below goal A1c. Have recommended continuation of medication for now with repeat A1c in 3 months.   Hyperlipidemia Patient currently tolerating Crestor 10 mg three times weekly. Has a history of multiple statin intolerances with daily dosing. Baseline LDL off statin therapy was 124. Recommended follow up with pharmacy clinic to see if further adjustment of medication should be attempted. Will need repeat LDL when highest tolerable dose of Crestor is determined.   Essential hypertension BP very well controlled today. Continue current medication regimen.     Phill Myron, D.O. 06/19/2017, 4:54 PM PGY-3, [place]

## 2017-06-20 MED ORDER — ROSUVASTATIN CALCIUM 10 MG PO TABS: 10.0000 mg | ORAL_TABLET | ORAL | 1 refills | Status: DC

## 2017-06-27 ENCOUNTER — Other Ambulatory Visit: Payer: Self-pay | Admitting: Internal Medicine

## 2017-06-27 DIAGNOSIS — E785 Hyperlipidemia, unspecified: Secondary | ICD-10-CM

## 2017-06-27 DIAGNOSIS — I10 Essential (primary) hypertension: Secondary | ICD-10-CM

## 2017-06-27 NOTE — Telephone Encounter (Signed)
Fax from pharmacy states patient requests 90 day supply of Rosuvastatin at three times weekly.  Danley Danker, RN [facility] RN)

## 2017-07-01 MED ORDER — ROSUVASTATIN CALCIUM 10 MG PO TABS: 10.0000 mg | ORAL_TABLET | ORAL | 0 refills | Status: DC

## 2017-07-27 ENCOUNTER — Emergency Department (HOSPITAL_COMMUNITY)
Admission: EM | Admit: 2017-07-27 | Discharge: 2017-07-27 | Disposition: A | Discharge status: Home / Self Care | Payer: 59 | Attending: Emergency Medicine | Admitting: Emergency Medicine

## 2017-07-27 ENCOUNTER — Encounter (HOSPITAL_COMMUNITY): Payer: Self-pay | Admitting: Emergency Medicine

## 2017-07-27 ENCOUNTER — Other Ambulatory Visit: Payer: Self-pay

## 2017-07-27 DIAGNOSIS — Z7984 Long term (current) use of oral hypoglycemic drugs: Secondary | ICD-10-CM | POA: Insufficient documentation

## 2017-07-27 DIAGNOSIS — Y9289 Other specified places as the place of occurrence of the external cause: Secondary | ICD-10-CM | POA: Insufficient documentation

## 2017-07-27 DIAGNOSIS — W57XXXA Bitten or stung by nonvenomous insect and other nonvenomous arthropods, initial encounter: Secondary | ICD-10-CM | POA: Diagnosis not present

## 2017-07-27 DIAGNOSIS — Y939 Activity, unspecified: Secondary | ICD-10-CM | POA: Diagnosis not present

## 2017-07-27 DIAGNOSIS — S70262A Insect bite (nonvenomous), left hip, initial encounter: Secondary | ICD-10-CM | POA: Insufficient documentation

## 2017-07-27 DIAGNOSIS — E785 Hyperlipidemia, unspecified: Secondary | ICD-10-CM | POA: Diagnosis not present

## 2017-07-27 DIAGNOSIS — Y998 Other external cause status: Secondary | ICD-10-CM | POA: Insufficient documentation

## 2017-07-27 DIAGNOSIS — E119 Type 2 diabetes mellitus without complications: Secondary | ICD-10-CM | POA: Insufficient documentation

## 2017-07-27 DIAGNOSIS — I10 Essential (primary) hypertension: Secondary | ICD-10-CM | POA: Diagnosis not present

## 2017-07-27 DIAGNOSIS — Z79899 Other long term (current) drug therapy: Secondary | ICD-10-CM | POA: Diagnosis not present

## 2017-07-27 DIAGNOSIS — Z853 Personal history of malignant neoplasm of breast: Secondary | ICD-10-CM | POA: Insufficient documentation

## 2017-07-27 MED ORDER — DOXYCYCLINE HYCLATE 100 MG PO TABS
200.0000 mg | ORAL_TABLET | Freq: Once | ORAL | Status: AC
  Administered 2017-07-27: 200 mg via ORAL
  Filled 2017-07-27: qty 2

## 2017-07-27 NOTE — ED Notes (Signed)
ED Provider at bedside. 

## 2017-07-27 NOTE — ED Triage Notes (Signed)
Patient reports removing tick from left hip x2 days ago. Reports "today I don't feel like myself." Denies N/V/D, abdominal pain, SOB, chest pain, headache. Ambulatory.

## 2017-07-27 NOTE — Discharge Instructions (Signed)
You were seen here today for tick bite. Your exam was reassuring. I have given you a dose of doxycyline in the department as prophylaxis of tick born illnesses.  See attached handouts.  Please follow with your primary care provider in the next 1 week.  What are the signs or symptoms? Symptoms may begin 2-14 days after a tick bite. The most common early symptoms are: Fever. Muscle aches. Headache. Nausea. Vomiting. Poor appetite. Abdominal pain. Rash throghout the body including on palms and soles  Return if: You have symptoms of a disease after a tick bite. Symptoms of a tick-borne disease can occur from moments after the tick bites to up to 30 days after a tick is removed. Symptoms include: Difficulty walking or moving your legs. Numbness in the legs. Paralysis. Red rash around the tick bite area that is shaped like a target or a "bull's-eye." Redness and swelling in the area of the tick bite. Fever. Repeated vomiting. Diarrhea. Weight loss. Tender, swollen lymph glands. Shortness of breath. Cough. Pain in the abdomen. Headache. Abnormal tiredness. A change in your level of consciousness. Confusion.

## 2017-07-27 NOTE — ED Provider Notes (Signed)
[facility] DEPT Provider Note   CSN: [id number] Arrival date & time: 07/27/17  0746     History   Chief Complaint Chief Complaint  Patient presents with  . Insect Bite    HPI [name] is a 57 y.o. female who presents the emergency department today for tick bite.  Patient reports that she was outside in the fluids when she was bitten by a tick in her left hip approximately 48 hours ago.  She reports she was able to remove the tick including the head.  She notes that since that time she has been worried that she may be developing something.  She states she feels weird but denies any symptoms.  She reports no fever, chills, headache, ear pain, nasal congestion, sore throat, neck stiffness, chest pain, shortness breath, cough, abdominal pain, nausea/vomiting/diarrhea, rash, or neurologic symptoms.  HPI  Past Medical History:  Diagnosis Date  . Arthritis   . Breast CA ([place])    breast CA- right breast  . Diabetes mellitus   . Heel spur    bilateral  . Hyperlipidemia   . Hypertension   . Thrombocytopenia ([place])   . Thyroid disease    parathyroid removed    Patient Active Problem List   Diagnosis Date Noted  . Arthritis of both hands 04/03/2016  . Obesity 08/30/2015  . 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 Cataracts, bilateral 09/13/2014  . Glaucoma 09/13/2014  . Mitral regurgitation 02/18/2014  . Transaminitis 11/13/2012  . Hyperlipidemia 04/04/2011  . Diabetes ([place]) 06/07/2010  . Cancer of central portion of right female breast ([place]) 10/04/2009  . Idiopathic thrombocytopenic purpura (ITP) ([place]) 06/22/2009  . Essential hypertension 06/22/2009  . HYPERPARATHYROIDISM, HX OF 06/22/2009  . Avitaminosis D 09/01/2008    Past Surgical History:  Procedure Laterality Date  . ABDOMINAL HYSTERECTOMY     1996 per pt  . BREAST BIOPSY Right 06/2009  . BREAST SURGERY  09/12/10   excision of skin mass - right breast, had another  surgery to remove more Cancer cells  . INNER EAR SURGERY  06/01/13  . MASTECTOMY Right 07/21/2009  . PARATHYROIDECTOMY     partial?  . REDUCTION MAMMAPLASTY Left 2013     OB History   None      Home Medications    Prior to Admission medications   Medication Sig Start Date End Date Taking? Authorizing Provider  acetaminophen (TYLENOL) 325 MG tablet Take 2 tablets (650 mg total) by mouth every 6 (six) hours as needed. 03/30/17   Couture, Cortni S, PA-C  Blood Glucose Monitoring Suppl (ONE TOUCH ULTRA 2) w/Device KIT USE   TO CHECK GLUCOSE UP TO THREE TIMES DAILY AS NEEDED 10/11/15   McKeag, Marylynn Pearson, MD  carvedilol (COREG) 12.5 MG tablet TAKE 1 TABLET BY MOUTH TWICE DAILY WITH A MEAL 07/01/17   Nicolette Bang, DO  glucose blood (ONE TOUCH ULTRA TEST) test strip Check blood sugar once daily 10/27/15   Haney, Alyssa A, MD  hydrochlorothiazide (MICROZIDE) 12.5 MG capsule Take 1 capsule (12.5 mg total) daily by mouth. 12/12/16   Nicolette Bang, DO  Lancets [facility] ULTRASOFT) lancets Check daily as needed 07/19/15   Olam Idler, MD  lisinopril (PRINIVIL,ZESTRIL) 40 MG tablet Take 1 tablet (40 mg total) daily by mouth. 12/12/16   Nicolette Bang, DO  metFORMIN (GLUCOPHAGE-XR) 500 MG 24 hr tablet Take 1 tablet (500 mg total) by mouth daily with breakfast. 03/14/17   Juleen China,  Bayard Beaver, DO  rosuvastatin (CRESTOR) 10 MG tablet Take 1 tablet (10 mg total) by mouth 3 (three) times a week. 07/02/17   Nicolette Bang, DO  spironolactone (ALDACTONE) 25 MG tablet TAKE 1 TABLET BY MOUTH ONCE DAILY 11/25/16   Minus Breeding, MD  tamoxifen (NOLVADEX) 20 MG tablet Take 1 tablet (20 mg total) by mouth daily. 11/22/16   Nicholas Lose, MD  traMADol (ULTRAM) 50 MG tablet Take 1 tablet (50 mg total) by mouth 2 (two) times daily as needed. 06/17/17   Nicolette Bang, DO    Family History Family History  Problem Relation Age of Onset  . CAD Mother 65        CABG  . Stroke Father   . Heart attack Brother 32  . Colon cancer Neg Hx   . Esophageal cancer Neg Hx   . Rectal cancer Neg Hx   . Stomach cancer Neg Hx     Social History Social History   Tobacco Use  . Smoking status: Never Smoker  . Smokeless tobacco: Never Used  Substance Use Topics  . Alcohol use: No  . Drug use: No     Allergies   Patient has no known allergies.   Review of Systems Review of Systems  All other systems reviewed and are negative.    Physical Exam Updated Vital Signs BP 124/67 (BP Location: Left Arm)   Pulse 83   Temp 98.5 F (36.9 C) (Oral)   Resp 17   SpO2 99%   Physical Exam  Constitutional: She appears well-developed and well-nourished.  HENT:  Head: Normocephalic and atraumatic.  Right Ear: Tympanic membrane and external ear normal.  Left Ear: Tympanic membrane and external ear normal.  Nose: Nose normal.  Mouth/Throat: Uvula is midline, oropharynx is clear and moist and mucous membranes are normal. No tonsillar exudate.  Large amount of cerumen in left ear. No obstruction. TM ability visualized and is pearly gray with cone of light and bony landmarks seen.  No rupture. The patient has normal phonation and is in control of secretions. No stridor.  Midline uvula without edema. Soft palate rises symmetrically.  No tonsillar erythema or exudates. No PTA. Tongue protrusion is normal. No trismus. No creptius on neck palpation and patient has good dentition. No gingival erythema or fluctuance noted. Mucus membranes moist.   Eyes: Pupils are equal, round, and reactive to light. Right eye exhibits no discharge. Left eye exhibits no discharge. No scleral icterus.  Neck: Trachea normal. Neck supple. No spinous process tenderness present. No neck rigidity. Normal range of motion present.  No nuchal rigidity or meningismus  Cardiovascular: Normal rate, regular rhythm and intact distal pulses.  No murmur heard. Pulses:      Radial pulses are 2+ on  the right side, and 2+ on the left side.       Dorsalis pedis pulses are 2+ on the right side, and 2+ on the left side.       Posterior tibial pulses are 2+ on the right side, and 2+ on the left side.  No lower extremity swelling or edema. Calves symmetric in size bilaterally.  Pulmonary/Chest: Effort normal and breath sounds normal. She exhibits no tenderness.  Abdominal: Soft. Bowel sounds are normal. There is no tenderness. There is no rebound and no guarding.  Musculoskeletal: She exhibits no edema.  Lymphadenopathy:    She has no cervical adenopathy.  Neurological: She is alert.  Speech clear. Follows commands. No facial droop. PERRLA. EOM  grossly intact. CN III-XII grossly intact. Grossly moves all extremities 4 without ataxia. Able and appropriate strength for age to upper and lower extremities bilaterally including grip strength, plantar flexion and dorsiflexion.   Skin: Skin is warm and dry. No rash noted. She is not diaphoretic.  Skin visualized with penlight and without evidence of rash.  There is a very small punctate wound where patient reports  Psychiatric: She has a normal mood and affect.  Nursing note and vitals reviewed.    ED Treatments / Results  Labs (all labs ordered are listed, but only abnormal results are displayed) Labs Reviewed - No data to display  EKG None  Radiology No results found.  Procedures Procedures (including critical care time)  Medications Ordered in ED Medications  doxycycline (VIBRA-TABS) tablet 200 mg (200 mg Oral Given 07/27/17 0926)     Initial Impression / Assessment and Plan / ED Course  I have reviewed the triage vital signs and the nursing notes.  Pertinent labs & imaging results that were available during my care of the patient were reviewed by me and considered in my medical decision making (see chart for details).     57 y.o. female presenting after a tick bite.  She states she feels funny but denies any current symptoms  at this time.  Vital signs are reassuring.  She is without fever, tachycardia, tachypnea, hypoxia or hypotension.  No headache, rash, neck stiffness, chest pain, shortness of breath, cough, or other symptoms at this current time.  No rash visualized.  She is neurologically intact.  Exam reassuring as above.  Patient did have a tick bite.  It appears the head was removed.  I am unsure if this was a deer tick and for how long it was on for.  Will give 200 mg of doxycycline prophylactically.  Will recommend patient follow with PCP.  Gave information on what to look for for [facility] spotted fever as well as Lyme disease. The evaluation does not show pathology that would require ongoing emergent intervention or inpatient treatment. I advised the patient to follow-up with PCP this week. I advised the patient to return to the emergency department with new or worsening symptoms or new concerns. Specific return precautions discussed. The patient verbalized understanding and agreement with plan. All questions answered. No further questions at this time. The patient is hemodynamically stable, mentating appropriately and appears safe for discharge.  Final Clinical Impressions(s) / ED Diagnoses   Final diagnoses:  Tick bite, initial encounter    ED Discharge Orders    None       Lorelle Gibbs 07/27/17 1527    Charlesetta Shanks, MD 07/30/17 [phone]

## 2017-07-28 ENCOUNTER — Ambulatory Visit: Payer: 59 | Admitting: Internal Medicine

## 2017-07-29 ENCOUNTER — Other Ambulatory Visit: Payer: Self-pay

## 2017-07-29 ENCOUNTER — Ambulatory Visit (INDEPENDENT_AMBULATORY_CARE_PROVIDER_SITE_OTHER): Payer: 59 | Admitting: Internal Medicine

## 2017-07-29 ENCOUNTER — Encounter: Payer: Self-pay | Admitting: Internal Medicine

## 2017-07-29 VITALS — BP 122/74 | HR 74 | Temp 98.4°F | Ht 64.0 in | Wt 200.0 lb

## 2017-07-29 DIAGNOSIS — R51 Headache: Secondary | ICD-10-CM | POA: Diagnosis not present

## 2017-07-29 DIAGNOSIS — W57XXXD Bitten or stung by nonvenomous insect and other nonvenomous arthropods, subsequent encounter: Secondary | ICD-10-CM | POA: Diagnosis not present

## 2017-07-29 DIAGNOSIS — R519 Headache, unspecified: Secondary | ICD-10-CM

## 2017-07-29 MED ORDER — DOXYCYCLINE HYCLATE 100 MG PO CAPS: 100.0000 mg | ORAL_CAPSULE | Freq: Two times a day (BID) | ORAL | 0 refills | Status: DC

## 2017-07-29 NOTE — Progress Notes (Signed)
   Subjective:    [name] - 57 y.o. female MRN [id number]  Date of birth: 1960-10-20  HPI  [name] is here for follow up of tick bite. Was seen in ED on 6/23 for headache and chills after tick bite. Reports that she saw the tick on the evening of 6/21 while she was showering. It was on her lift hip and she removed it. In the ED, she had stable vital signs and no neurological exam abnormalities. She was given Doxycyline 200 mg as prophylaxis. She reports that she took this. Today, she reports that she is feeling better but is still having a persistent left sided headache and ear pain. She is concerned this may be related to the tick bite. She denies fevers, neck stiffness, joint pain, and rashes.   -  reports that she has never smoked. She has never used smokeless tobacco. - Review of Systems: Per HPI. - Past Medical History: Patient Active Problem List   Diagnosis Date Noted  . Arthritis of both hands 04/03/2016  . Obesity 08/30/2015  . 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 Cataracts, bilateral 09/13/2014  . Glaucoma 09/13/2014  . Mitral regurgitation 02/18/2014  . Transaminitis 11/13/2012  . Hyperlipidemia 04/04/2011  . Diabetes ([place]) 06/07/2010  . Cancer of central portion of right female breast ([place]) 10/04/2009  . Idiopathic thrombocytopenic purpura (ITP) ([place]) 06/22/2009  . Essential hypertension 06/22/2009  . HYPERPARATHYROIDISM, HX OF 06/22/2009  . Avitaminosis D 09/01/2008   - Medications: reviewed and updated   Objective:   Physical Exam BP 122/74   Pulse 74   Temp 98.4 F (36.9 C) (Oral)   Ht 5\' 4"  (1.626 m)   Wt 200 lb (90.7 kg)   SpO2 97%   BMI 34.33 kg/m  Gen: NAD, alert, cooperative with exam, well-appearing HEENT: NCAT, PERRL, clear conjunctiva, oropharynx clear, supple neck, large amount of cerumen in left ear, after removal TMs normal bilaterally, no TTP over the temporal region, no nuchal rigidity  CV: RRR, good S1/S2, no murmur,  no edema, capillary refill brisk  Resp: CTABL, no wheezes, non-labored Abd: SNTND, BS present, no guarding or organomegaly Skin: no rashes visualized  Neuro: CN II-XII grossly intact. No facial droop. Strength 5/5 in all extremities.  Psych: good insight, alert and oriented     Assessment & Plan:   1. Tick bite, subsequent encounter Discussed with patient that I do not think headache is related to tick bite especially given lack of other symptoms concerning for lyme disease or RMSF. However, patient very concerned so have elected to prescribe 10 day course of Doxycycline especially since risks with this antibiotic are fairly low. Strict return precautions discussed.  - doxycycline (VIBRAMYCIN) 100 MG capsule; Take 1 capsule (100 mg total) by mouth 2 (two) times daily.  Dispense: 20 capsule; Refill: 0   Phill Myron, D.O. 07/29/2017, 1:58 PM PGY-3, [place]

## 2017-07-29 NOTE — Patient Instructions (Signed)
I have low suspicion that your headache is related to the tick bite as I would expect other symptoms like fevers, chills, rash etc. However, I think it is reasonable to take an antibiotic to be on the safe side. I have prescribed Doxycyline that you will take twice per day for 10 days. Please return if symptoms worsen, you do have fevers, you have rash or redness at the bite site.

## 2017-08-13 ENCOUNTER — Other Ambulatory Visit: Payer: Self-pay

## 2017-08-13 MED ORDER — HYDROCHLOROTHIAZIDE 12.5 MG PO CAPS: 12.5000 mg | ORAL_CAPSULE | Freq: Every day | ORAL | 2 refills | Status: DC

## 2017-08-28 ENCOUNTER — Other Ambulatory Visit: Payer: Self-pay

## 2017-08-28 DIAGNOSIS — I10 Essential (primary) hypertension: Secondary | ICD-10-CM

## 2017-08-28 MED ORDER — CARVEDILOL 12.5 MG PO TABS: 12.5000 mg | ORAL_TABLET | Freq: Two times a day (BID) | ORAL | 0 refills | Status: DC

## 2017-09-04 ENCOUNTER — Other Ambulatory Visit: Payer: Self-pay

## 2017-09-04 ENCOUNTER — Ambulatory Visit (INDEPENDENT_AMBULATORY_CARE_PROVIDER_SITE_OTHER): Payer: 59 | Admitting: Family Medicine

## 2017-09-04 ENCOUNTER — Encounter: Payer: Self-pay | Admitting: Family Medicine

## 2017-09-04 VITALS — BP 150/82 | HR 76 | Temp 98.6°F | Ht 64.0 in | Wt 204.0 lb

## 2017-09-04 DIAGNOSIS — M25552 Pain in left hip: Secondary | ICD-10-CM | POA: Diagnosis not present

## 2017-09-04 DIAGNOSIS — M25551 Pain in right hip: Secondary | ICD-10-CM

## 2017-09-04 DIAGNOSIS — I10 Essential (primary) hypertension: Secondary | ICD-10-CM | POA: Diagnosis not present

## 2017-09-04 NOTE — Assessment & Plan Note (Signed)
Likely muscular in nature given presentation.  Instructed to stretch (can use youtube videos to save money) and use ice/ibuprofen, will check in ~39months from now

## 2017-09-04 NOTE — Assessment & Plan Note (Signed)
Didn't take her meds today.  No indication to change meds, pain free and says well controlled when she actually takes her meds

## 2017-09-04 NOTE — Patient Instructions (Signed)
It was a pleasure to see you today! Thank you for choosing [facility] for your primary care. [name] was seen for hip pain. Come back to the clinic if you have not had improvement in 2 months, and go to the emergency room if you have any life threatening symptoms.  Today we talked  About your hip pain.  This seems to be muscular in nature and we are going to try stretching daily (you can use beginner yoga/stretching videos on youtube) and ice with ibuprofen/tylenol.   If you don't hear from [place] in two weeks, please give [place] a call to verify your results. Otherwise, we look forward to seeing you again at your next visit. If you have any questions or concerns before then, please call the clinic at [phone].   Please bring all your medications to every doctors visit   Sign up for My Chart to have easy access to your labs results, and communication with your Primary care physician.     Please check-out at the front desk before leaving the clinic.     Best,  Dr. [name] FAMILY MEDICINE RESIDENT - PGY2 09/04/2017 1:56 PM

## 2017-09-04 NOTE — Progress Notes (Signed)
    Subjective:  [name] is a 57 y.o. female who presents to the [facility] today with a chief complaint of hip pain.   HPI: Hip pain Bilateral hip pain, worse if she walks long distances.  Lateral/rear hip with no radiation, not worse with any particular positioning.  Not worse in the morning.  No paresthesia/paralysis/incontininence/trauma.  No falls or loss of balance.  HTN: had not taken her meds until right when she showed up, says it has been controlled well when she takes her meds.   Objective:  Physical Exam: BP (!) 150/82   Pulse 76   Temp 98.6 F (37 C) (Oral)   Ht 5\' 4"  (1.626 m)   Wt 204 lb (92.5 kg)   SpO2 98%   BMI 35.02 kg/m   Gen: NAD, resting comfortably, obese GI: Normal bowel sounds present. Soft, Nontender, Nondistended. MSK: no edema, cyanosis, or clubbing noted.  Able to ambulate freely but with noticeable sway from right/left Skin: warm, dry Neuro: grossly normal, moves all extremities Psych: Normal affect and thought content  No results found for this or any previous visit (from the past 72 hour(s)).   Assessment/Plan:  Essential hypertension Didn't take her meds today.  No indication to change meds, pain free and says well controlled when she actually takes her meds  Hip pain Likely muscular in nature given presentation.  Instructed to stretch (can use youtube videos to save money) and use ice/ibuprofen, will check in ~6months from now   [name], [place] - PGY2 09/04/2017 2:04 PM

## 2017-09-08 ENCOUNTER — Telehealth: Payer: Self-pay | Admitting: Family Medicine

## 2017-09-08 NOTE — Telephone Encounter (Signed)
LVM - Return in about 2 months (around 11/04/2017).

## 2017-09-24 ENCOUNTER — Other Ambulatory Visit: Payer: Self-pay | Admitting: Cardiology

## 2017-09-24 NOTE — Telephone Encounter (Signed)
Rx sent to pharmacy   

## 2017-10-13 ENCOUNTER — Other Ambulatory Visit: Payer: Self-pay

## 2017-10-13 MED ORDER — METFORMIN HCL ER 500 MG PO TB24: 500.0000 mg | ORAL_TABLET | Freq: Every day | ORAL | 1 refills | Status: DC

## 2017-10-17 ENCOUNTER — Other Ambulatory Visit: Payer: Self-pay

## 2017-10-17 DIAGNOSIS — E785 Hyperlipidemia, unspecified: Secondary | ICD-10-CM

## 2017-10-20 MED ORDER — ROSUVASTATIN CALCIUM 10 MG PO TABS: 10.0000 mg | ORAL_TABLET | ORAL | 0 refills | Status: DC

## 2017-10-31 ENCOUNTER — Other Ambulatory Visit: Payer: Self-pay | Admitting: Family Medicine

## 2017-10-31 DIAGNOSIS — I10 Essential (primary) hypertension: Secondary | ICD-10-CM

## 2017-11-14 ENCOUNTER — Other Ambulatory Visit: Payer: Self-pay | Admitting: Cardiology

## 2017-11-24 ENCOUNTER — Inpatient Hospital Stay: Payer: 59

## 2017-11-24 ENCOUNTER — Telehealth: Payer: Self-pay | Admitting: Hematology and Oncology

## 2017-11-24 ENCOUNTER — Other Ambulatory Visit: Payer: Self-pay | Admitting: Hematology and Oncology

## 2017-11-24 ENCOUNTER — Inpatient Hospital Stay: Payer: 59 | Attending: Hematology and Oncology | Admitting: Hematology and Oncology

## 2017-11-24 DIAGNOSIS — Z170 Estrogen receptor positive status [ER+]: Principal | ICD-10-CM

## 2017-11-24 DIAGNOSIS — Z923 Personal history of irradiation: Secondary | ICD-10-CM

## 2017-11-24 DIAGNOSIS — Z79810 Long term (current) use of selective estrogen receptor modulators (SERMs): Secondary | ICD-10-CM | POA: Insufficient documentation

## 2017-11-24 DIAGNOSIS — R232 Flushing: Secondary | ICD-10-CM | POA: Insufficient documentation

## 2017-11-24 DIAGNOSIS — Z9221 Personal history of antineoplastic chemotherapy: Secondary | ICD-10-CM

## 2017-11-24 DIAGNOSIS — Z7984 Long term (current) use of oral hypoglycemic drugs: Secondary | ICD-10-CM | POA: Diagnosis not present

## 2017-11-24 DIAGNOSIS — C50111 Malignant neoplasm of central portion of right female breast: Secondary | ICD-10-CM

## 2017-11-24 DIAGNOSIS — Z79899 Other long term (current) drug therapy: Secondary | ICD-10-CM | POA: Diagnosis not present

## 2017-11-24 DIAGNOSIS — D696 Thrombocytopenia, unspecified: Secondary | ICD-10-CM | POA: Diagnosis not present

## 2017-11-24 DIAGNOSIS — Z9011 Acquired absence of right breast and nipple: Secondary | ICD-10-CM | POA: Diagnosis not present

## 2017-11-24 DIAGNOSIS — C50411 Malignant neoplasm of upper-outer quadrant of right female breast: Secondary | ICD-10-CM | POA: Diagnosis not present

## 2017-11-24 LAB — CMP (CANCER CENTER ONLY)
ALK PHOS: 38 U/L (ref 38–126)
ALT: 35 U/L (ref 0–44)
AST: 35 U/L (ref 15–41)
Albumin: 3.3 g/dL — ABNORMAL LOW (ref 3.5–5.0)
Anion gap: 11 (ref 5–15)
BILIRUBIN TOTAL: 0.4 mg/dL (ref 0.3–1.2)
BUN: 11 mg/dL (ref 6–20)
CALCIUM: 9.2 mg/dL (ref 8.9–10.3)
CO2: 26 mmol/L (ref 22–32)
CREATININE: 0.97 mg/dL (ref 0.44–1.00)
Chloride: 105 mmol/L (ref 98–111)
GLUCOSE: 125 mg/dL — AB (ref 70–99)
Potassium: 3.9 mmol/L (ref 3.5–5.1)
Sodium: 142 mmol/L (ref 135–145)
Total Protein: 7.1 g/dL (ref 6.5–8.1)

## 2017-11-24 LAB — CBC WITH DIFFERENTIAL (CANCER CENTER ONLY)
Abs Immature Granulocytes: 0.01 10*3/uL (ref 0.00–0.07)
Basophils Absolute: 0.1 10*3/uL (ref 0.0–0.1)
Basophils Relative: 1 %
EOS PCT: 2 %
Eosinophils Absolute: 0.1 10*3/uL (ref 0.0–0.5)
HEMATOCRIT: 38.9 % (ref 36.0–46.0)
HEMOGLOBIN: 12.3 g/dL (ref 12.0–15.0)
Immature Granulocytes: 0 %
LYMPHS ABS: 3.1 10*3/uL (ref 0.7–4.0)
LYMPHS PCT: 43 %
MCH: 24.8 pg — AB (ref 26.0–34.0)
MCHC: 31.6 g/dL (ref 30.0–36.0)
MCV: 78.4 fL — AB (ref 80.0–100.0)
MONO ABS: 0.6 10*3/uL (ref 0.1–1.0)
MONOS PCT: 8 %
Neutro Abs: 3.3 10*3/uL (ref 1.7–7.7)
Neutrophils Relative %: 46 %
Platelet Count: 89 10*3/uL — ABNORMAL LOW (ref 150–400)
RBC: 4.96 MIL/uL (ref 3.87–5.11)
RDW: 14.5 % (ref 11.5–15.5)
WBC: 7.2 10*3/uL (ref 4.0–10.5)
nRBC: 0 % (ref 0.0–0.2)

## 2017-11-24 MED ORDER — TAMOXIFEN CITRATE 20 MG PO TABS: 20.0000 mg | ORAL_TABLET | Freq: Every day | ORAL | 3 refills | Status: DC

## 2017-11-24 NOTE — Telephone Encounter (Signed)
Gave avs and calendar ° °

## 2017-11-24 NOTE — Assessment & Plan Note (Signed)
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 Started Tamoxifen 09/2010  Tamoxifen toxicities: 1. Hot flashes improved with Effexor 2. joint and muscle stiffness: I encouraged her to exercise and stay active. Plan is to continue tamoxifen for 10 years  Breast cancer surveillance: 1. Breast exam 10/21/2019is normal 2. Mammogram 3/29/2019is normal on the left breast category B breast density  Chronic mild thrombocytopenia: platelet count is  and they're stable  Return to clinic in 1 year for follow-up

## 2017-11-24 NOTE — Progress Notes (Signed)
Patient Care Team: [name], DO as PCP - General (Family Medicine)  DIAGNOSIS:  Encounter Diagnosis  Name Primary?  . Malignant neoplasm of central portion of right breast in female, estrogen receptor positive ([place])     SUMMARY OF ONCOLOGIC HISTORY:   Cancer of central portion of right female breast ([place])   07/21/2009 Surgery    Right breast mastectomy: 2 foci of invasive ductal carcinoma 2.2 cm, 0.5 cm, ER positive PR positive HER-2 negative Ki-67 11%, Oncotype score 27, 18% ROR    10/23/2009 - 12/25/2009 Chemotherapy    Adjuvant chemotherapy with Taxotere and Cytoxan every 3 weeks 4 cycles followed by reconstruction of the breast by Dr. Harlow Mares    02/13/2010 -  Anti-estrogen oral therapy    Tamoxifen 20 mg daily    02/19/2010 - 03/28/2010 Radiation Therapy    Adjuvant radiation therapy    09/12/2010 Surgery    Recurrence of invasive ductal carcinoma of the right mastectomy scar is a nodule, invasive ductal carcinoma margins positive, ER 96%, PR 75%, HER-2 negative    09/17/2013 Surgery    Left breast reduction surgery     CHIEF COMPLIANT: Follow-up on tamoxifen therapy  INTERVAL HISTORY: [name] is a 57 year old with above-mentioned history of right breast cancer currently on tamoxifen for the past 8 years.  She does not have any hot flashes anymore but she has developed muscle aches and pains which appear to get better with activity or exercise.  REVIEW OF SYSTEMS:   Constitutional: Denies fevers, chills or abnormal weight loss Eyes: Denies blurriness of vision Ears, nose, mouth, throat, and face: Denies mucositis or sore throat Respiratory: Denies cough, dyspnea or wheezes Cardiovascular: Denies palpitation, chest discomfort Gastrointestinal:  Denies nausea, heartburn or change in bowel habits Skin: Denies abnormal skin rashes Lymphatics: Denies new lymphadenopathy or easy bruising Neurological:Denies numbness, tingling or new weaknesses Behavioral/Psych: Mood  is stable, no new changes  Extremities: No lower extremity edema Breast:  denies any pain or lumps or nodules  All other systems were reviewed with the patient and are negative.  I have reviewed the past medical history, past surgical history, social history and family history with the patient and they are unchanged from previous note.  ALLERGIES:  has No Known Allergies.  MEDICATIONS:  Current Outpatient Medications  Medication Sig Dispense Refill  . acetaminophen (TYLENOL) 325 MG tablet Take 2 tablets (650 mg total) by mouth every 6 (six) hours as needed. 30 tablet 0  . Blood Glucose Monitoring Suppl (ONE TOUCH ULTRA 2) w/Device KIT USE   TO CHECK GLUCOSE UP TO THREE TIMES DAILY AS NEEDED 1 each 0  . carvedilol (COREG) 12.5 MG tablet Take 1 tablet (12.5 mg total) by mouth 2 (two) times daily with a meal. 180 tablet 1  . doxycycline (VIBRAMYCIN) 100 MG capsule Take 1 capsule (100 mg total) by mouth 2 (two) times daily. 20 capsule 0  . glucose blood (ONE TOUCH ULTRA TEST) test strip Check blood sugar once daily 100 each 3  . hydrochlorothiazide (MICROZIDE) 12.5 MG capsule Take 1 capsule (12.5 mg total) by mouth daily. 90 capsule 2  . Lancets (ONETOUCH ULTRASOFT) lancets Check daily as needed 100 each 12  . lisinopril (PRINIVIL,ZESTRIL) 40 MG tablet Take 1 tablet (40 mg total) daily by mouth. 90 tablet 3  . metFORMIN (GLUCOPHAGE-XR) 500 MG 24 hr tablet Take 1 tablet (500 mg total) by mouth daily with breakfast. 90 tablet 1  . rosuvastatin (CRESTOR) 10 MG tablet Take 1  tablet (10 mg total) by mouth 3 (three) times a week. 90 tablet 0  . spironolactone (ALDACTONE) 25 MG tablet Take 1 tablet (25 mg total) by mouth daily. Please call to make appointment for further refills. 60 tablet 0  . tamoxifen (NOLVADEX) 20 MG tablet Take 1 tablet (20 mg total) by mouth daily. 90 tablet 3  . traMADol (ULTRAM) 50 MG tablet Take 1 tablet (50 mg total) by mouth 2 (two) times daily as needed. 60 tablet 1   No  current facility-administered medications for this visit.     PHYSICAL EXAMINATION: ECOG PERFORMANCE STATUS: 1 - Symptomatic but completely ambulatory  Vitals:   11/24/17 0815  BP: (!) 145/84  Pulse: 89  Resp: 18  Temp: 98.4 F (36.9 C)  SpO2: 100%   Filed Weights   11/24/17 0815  Weight: 198 lb 12.8 oz (90.2 kg)    GENERAL:alert, no distress and comfortable SKIN: skin color, texture, turgor are normal, no rashes or significant lesions EYES: normal, Conjunctiva are pink and non-injected, sclera clear OROPHARYNX:no exudate, no erythema and lips, buccal mucosa, and tongue normal  NECK: supple, thyroid normal size, non-tender, without nodularity LYMPH:  no palpable lymphadenopathy in the cervical, axillary or inguinal LUNGS: clear to auscultation and percussion with normal breathing effort HEART: regular rate & rhythm and no murmurs and no lower extremity edema ABDOMEN:abdomen soft, non-tender and normal bowel sounds MUSCULOSKELETAL:no cyanosis of digits and no clubbing  NEURO: alert & oriented x 3 with fluent speech, no focal motor/sensory deficits EXTREMITIES: No lower extremity edema BREAST: No palpable lumps or nodules in the left breast or axilla.  Right breast has been reconstructed there may be a slight lymphedema in the right arm.. No palpable axillary supraclavicular or infraclavicular adenopathy no breast tenderness or nipple discharge. (exam performed in the presence of a chaperone)  LABORATORY DATA:  I have reviewed the data as listed CMP Latest Ref Rng & Units 11/22/2016 08/28/2016 05/13/2016  Glucose 70 - 140 mg/dl 109 105(H) 118(H)  BUN 7.0 - 26.0 mg/dL 13._0 Creatinine 0.6 - 1.1 mg/dL 0.9 0.99 0.93  Sodium 136 - 145 mEq/L 140 141 139  Potassium 3.5 - 5.1 mEq/L 4.5 4.4 4.5  Chloride 96 - 106 mmol/L - 102 105  CO2 22 - 29 mEq/L _1 Calcium 8.4 - 10.4 mg/dL 9.9 9.2 9.6  Total Protein 6.4 - 8.3 g/dL 7.5 7.0 -  Total Bilirubin 0.20 - 1.20 mg/dL 0.37 <0.2  -  Alkaline Phos 40 - 150 U/L 38(L) 35(L) -  AST 5 - 34 U/L 47(H) 50(H) -  ALT 0 - 55 U/L 37 34(H) -    Lab Results  Component Value Date   WBC 7.2 11/22/2016   HGB 12.6 11/22/2016   HCT 40.0 11/22/2016   MCV 79.1 (L) 11/22/2016   PLT 82 (L) 11/22/2016   NEUTROABS 3.2 11/22/2016    ASSESSMENT & PLAN:  Cancer of central portion of right female breast ([place]) 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 Started Tamoxifen 09/2010  Tamoxifen toxicities: 1. Hot flashes improved with Effexor 2. joint and muscle stiffness: I encouraged her to exercise and stay active. Plan is to continue tamoxifen for 10  years  Breast cancer surveillance: 1. Breast exam 10/21/2019is normal 2. Mammogram 3/29/2019is normal on the left breast category B breast density  Chronic mild thrombocytopenia: platelet count is  and they're stable  Return to clinic in 1 year for follow-up    No orders of the defined types were placed in this encounter.  The patient has a good understanding of the overall plan. she agrees with it. she will call with any problems that may develop before the next visit here.   Harriette Ohara, MD 11/24/17

## 2017-12-02 ENCOUNTER — Other Ambulatory Visit: Payer: Self-pay

## 2017-12-02 ENCOUNTER — Encounter: Payer: Self-pay | Admitting: Family Medicine

## 2017-12-02 ENCOUNTER — Ambulatory Visit (INDEPENDENT_AMBULATORY_CARE_PROVIDER_SITE_OTHER): Payer: 59 | Admitting: Family Medicine

## 2017-12-02 VITALS — BP 158/82 | HR 66 | Temp 98.9°F | Wt 199.0 lb

## 2017-12-02 DIAGNOSIS — E11319 Type 2 diabetes mellitus with unspecified diabetic retinopathy without macular edema: Secondary | ICD-10-CM

## 2017-12-02 DIAGNOSIS — Z23 Encounter for immunization: Secondary | ICD-10-CM | POA: Diagnosis not present

## 2017-12-02 DIAGNOSIS — I10 Essential (primary) hypertension: Secondary | ICD-10-CM

## 2017-12-02 LAB — POCT GLYCOSYLATED HEMOGLOBIN (HGB A1C): HBA1C, POC (CONTROLLED DIABETIC RANGE): 6.5 % (ref 0.0–7.0)

## 2017-12-02 MED ORDER — FUROSEMIDE 20 MG PO TABS: 20.0000 mg | ORAL_TABLET | ORAL | 0 refills | Status: DC

## 2017-12-02 NOTE — Patient Instructions (Signed)
It was a pleasure to see you today! Thank you for choosing [facility] for your primary care. [name] was seen for hypertension and hip pain. Come back to the clinic for a lab draw on friday, and go to the emergency room if you have any life threatening symtpoms.  Today we prescribed you some low dose lasix (furosemide) to help with your hyptertension and "puffiness" in your hands/feet.  Come in Friday for a lab draw please to check your kidneys.   Please bring all your medications to every doctors visit   Sign up for My Chart to have easy access to your labs results, and communication with your Primary care physician.     Please check-out at the front desk before leaving the clinic.     Best,  Dr. [name] FAMILY MEDICINE RESIDENT - PGY2 12/02/2017 2:29 PM

## 2017-12-02 NOTE — Progress Notes (Signed)
    Subjective:  [name] is a 57 y.o. female who presents to the [facility] today with a chief complaint of hip pain and swelling.   HPI: Patient complaining of bilateral chronic hip pain.  We have discussed this before and it does not resolve although she does not some improvement during the day if she "just forces myself to start moving".  Current worst complaint is pain when she slows down at night.  She has been using some tylenol but her stomach doesn't tolerate NSAIDs.  Has some prn tramadol but doesn't like to use it because she is concerned about narcotics  "swelling"- says one of her specialists told her her hands are puffy.  I had cancelled spirinolactone while on lisinopril and she is also hypertensive.  We discussed no orthopnea.  Objective:  Physical Exam: BP (!) 158/82   Pulse 66   Temp 98.9 F (37.2 C) (Oral)   Wt 199 lb (90.3 kg)   SpO2 99%   BMI 34.16 kg/m   Gen: NAD, resting comfortably CV: RRR with no murmurs appreciated Pulm: NWOB, CTAB with no crackles, wheezes, or rhonchi GI: Normal bowel sounds present. Soft, Nontender, Nondistended. MSK: 1+ edema, cyanosis, or clubbing noted Skin: warm, dry Neuro: grossly normal, moves all extremities Psych: Normal affect and thought content  Results for orders placed or performed in visit on 12/02/17 (from the past 72 hour(s))  HgB A1c     Status: None   Collection Time: 12/02/17  2:00 PM  Result Value Ref Range   Hemoglobin A1C     HbA1c POC (<> result, manual entry)     HbA1c, POC (prediabetic range)     HbA1c, POC (controlled diabetic range) 6.5 0.0 - 7.0 %     Assessment/Plan:  Need for immunization against influenza Flu shot ordered  Diabetes [facility]) Patient says she follows with optometry already, DM is controlled at this time.  We did discuss diet/activity improvements that could be made  Essential hypertension Still want to hold spirinolactone.  Will offer lasix and have patient come back in a week for  bnp/bmp lab visit.  Discussed return precautions.   [name], DO FAMILY MEDICINE RESIDENT - PGY2 12/04/2017 2:04 PM

## 2017-12-04 DIAGNOSIS — Z23 Encounter for immunization: Secondary | ICD-10-CM | POA: Insufficient documentation

## 2017-12-04 NOTE — Assessment & Plan Note (Signed)
Still want to hold spirinolactone.  Will offer lasix and have patient come back in a week for bnp/bmp lab visit.  Discussed return precautions.

## 2017-12-04 NOTE — Assessment & Plan Note (Signed)
Patient says she follows with optometry already, DM is controlled at this time.  We did discuss diet/activity improvements that could be made

## 2017-12-04 NOTE — Assessment & Plan Note (Signed)
Flu shot ordered.

## 2017-12-04 NOTE — Assessment & Plan Note (Signed)
>>  ASSESSMENT AND PLAN FOR DM (DIABETES MELLITUS) WITH COMPLICATIONS (HCC) WRITTEN ON 12/04/2017  2:03 PM BY BLAND, SCOTT, DO  Patient says she follows with optometry already, DM is controlled at this time.  We did discuss diet/activity improvements that could be made

## 2017-12-05 ENCOUNTER — Other Ambulatory Visit: Payer: 59

## 2017-12-05 DIAGNOSIS — I10 Essential (primary) hypertension: Secondary | ICD-10-CM

## 2017-12-06 LAB — BASIC METABOLIC PANEL
BUN / CREAT RATIO: 12 (ref 9–23)
BUN: 12 mg/dL (ref 6–24)
CO2: 25 mmol/L (ref 20–29)
Calcium: 10.1 mg/dL (ref 8.7–10.2)
Chloride: 103 mmol/L (ref 96–106)
Creatinine, Ser: 1.04 mg/dL — ABNORMAL HIGH (ref 0.57–1.00)
GFR calc Af Amer: 69 mL/min/{1.73_m2} (ref >59)
GFR calc non Af Amer: 60 mL/min/{1.73_m2} (ref >59)
GLUCOSE: 112 mg/dL — AB (ref 65–99)
POTASSIUM: 4.2 mmol/L (ref 3.5–5.2)
SODIUM: 143 mmol/L (ref 134–144)

## 2017-12-06 LAB — BRAIN NATRIURETIC PEPTIDE: BNP: 7.4 pg/mL (ref 0.0–100.0)

## 2017-12-08 ENCOUNTER — Other Ambulatory Visit: Payer: Self-pay

## 2017-12-08 DIAGNOSIS — E785 Hyperlipidemia, unspecified: Secondary | ICD-10-CM

## 2017-12-08 MED ORDER — ROSUVASTATIN CALCIUM 10 MG PO TABS: 10.0000 mg | ORAL_TABLET | ORAL | 0 refills | Status: DC

## 2017-12-10 ENCOUNTER — Ambulatory Visit: Payer: Medicare Other

## 2017-12-11 NOTE — Addendum Note (Signed)
Addended by: Londell Moh T on: 12/11/2017 04:30 PM   Modules accepted: Orders

## 2017-12-12 ENCOUNTER — Other Ambulatory Visit: Payer: 59

## 2017-12-12 DIAGNOSIS — I10 Essential (primary) hypertension: Secondary | ICD-10-CM | POA: Diagnosis not present

## 2017-12-13 LAB — BASIC METABOLIC PANEL
BUN/Creatinine Ratio: 16 (ref 9–23)
BUN: 14 mg/dL (ref 6–24)
CO2: 24 mmol/L (ref 20–29)
CREATININE: 0.89 mg/dL (ref 0.57–1.00)
Calcium: 9.6 mg/dL (ref 8.7–10.2)
Chloride: 99 mmol/L (ref 96–106)
GFR, EST AFRICAN AMERICAN: 83 mL/min/{1.73_m2} (ref >59)
GFR, EST NON AFRICAN AMERICAN: 72 mL/min/{1.73_m2} (ref >59)
Glucose: 74 mg/dL (ref 65–99)
Potassium: 4.3 mmol/L (ref 3.5–5.2)
SODIUM: 141 mmol/L (ref 134–144)

## 2017-12-15 ENCOUNTER — Telehealth: Payer: Self-pay

## 2017-12-15 NOTE — Telephone Encounter (Signed)
Spoke to pt. She will come in for blood work. She knows she needs to come in this week. Ottis Stain, CMA

## 2017-12-21 NOTE — Progress Notes (Deleted)
  Subjective:   Patient ID: [name]    DOB: Feb 23, 1960, 57 y.o. female   MRN: [id number]  [name] is a 57 y.o. female here for   Hand pain - B/L hand XR 2014 with mild OA of wrists  Review of Systems:  Per HPI.  [place], medications and smoking status reviewed.  Past Medical History:  Diagnosis Date  . Arthritis   . Breast CA ([place])    breast CA- right breast  . Diabetes mellitus   . Heel spur    bilateral  . Hyperlipidemia   . Hypertension   . Thrombocytopenia ([place])   . Thyroid disease    parathyroid removed    Objective:   There were no vitals taken for this visit. Vitals and nursing note reviewed.  General: well nourished, well developed, in no acute distress with non-toxic appearance HEENT: normocephalic, atraumatic, moist mucous membranes Neck: supple, non-tender without lymphadenopathy CV: regular rate and rhythm without murmurs, rubs, or gallops, no lower extremity edema Lungs: clear to auscultation bilaterally with normal work of breathing Abdomen: soft, non-tender, non-distended, no masses or organomegaly palpable, normoactive bowel sounds Skin: warm, dry, no rashes or lesions Extremities: warm and well perfused, normal tone MSK: ROM grossly intact, strength intact, gait normal Neuro: Alert and oriented, speech normal  Assessment & Plan:   No problem-specific Assessment & Plan notes found for this encounter.  No orders of the defined types were placed in this encounter.  No orders of the defined types were placed in this encounter.   Rory Percy, DO PGY-2, [facility] 12/21/2017 2:41 PM

## 2017-12-22 ENCOUNTER — Ambulatory Visit: Payer: 59

## 2018-01-15 ENCOUNTER — Other Ambulatory Visit: Payer: Self-pay | Admitting: *Deleted

## 2018-01-16 MED ORDER — TRAMADOL HCL 50 MG PO TABS: 50.0000 mg | ORAL_TABLET | Freq: Two times a day (BID) | ORAL | 1 refills | Status: DC | PRN

## 2018-01-20 ENCOUNTER — Ambulatory Visit: Payer: 59 | Admitting: Family Medicine

## 2018-01-25 ENCOUNTER — Ambulatory Visit (HOSPITAL_COMMUNITY)
Admission: EM | Admit: 2018-01-25 | Discharge: 2018-01-25 | Disposition: A | Discharge status: Home / Self Care | Payer: 59 | Attending: Physician Assistant | Admitting: Physician Assistant

## 2018-01-25 ENCOUNTER — Encounter (HOSPITAL_COMMUNITY): Payer: Self-pay | Admitting: Emergency Medicine

## 2018-01-25 DIAGNOSIS — H1031 Unspecified acute conjunctivitis, right eye: Secondary | ICD-10-CM | POA: Diagnosis not present

## 2018-01-25 DIAGNOSIS — B349 Viral infection, unspecified: Secondary | ICD-10-CM

## 2018-01-25 MED ORDER — OFLOXACIN 0.3 % OP SOLN: 1.0000 [drp] | Freq: Four times a day (QID) | OPHTHALMIC | 0 refills | Status: AC

## 2018-01-25 MED ORDER — FLUTICASONE PROPIONATE 50 MCG/ACT NA SUSP: 2.0000 | Freq: Every day | NASAL | 0 refills | Status: DC

## 2018-01-25 NOTE — ED Triage Notes (Signed)
Pt here for right eye redness and drainage x2 days  ?

## 2018-01-25 NOTE — ED Provider Notes (Signed)
[place]    CSN: [id number] Arrival date & time: 01/25/18  1002     History   Chief Complaint Chief Complaint  Patient presents with  . Conjunctivitis    HPI [name] is a 57 y.o. female.   57 year old female comes in with 2 day history of right eye redness and drainage. States woke up this morning and noticed redness is getting worse. Had crusting around the eye as well. Denies vision changes, photophobia. Denies contact lens, glasses use. Eyelid with some itching, and has been rubbing eye. Has had URI symptoms with cough, congestion, rhinorrhea. Denies fever, chills, night sweats. Has been taking otc cold medicine with good relief of URI symptoms.      Past Medical History:  Diagnosis Date  . Arthritis   . Breast CA ([place])    breast CA- right breast  . Diabetes mellitus   . Heel spur    bilateral  . Hyperlipidemia   . Hypertension   . Thrombocytopenia ([place])   . Thyroid disease    parathyroid removed    Patient Active Problem List   Diagnosis Date Noted  . Need for immunization against influenza 12/04/2017  . Arthritis of both hands 04/03/2016  . Obesity 08/30/2015  . 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 Cataracts, bilateral 09/13/2014  . Glaucoma 09/13/2014  . Mitral regurgitation 02/18/2014  . Hip pain 01/07/2014  . Transaminitis 11/13/2012  . Hyperlipidemia 04/04/2011  . Diabetes ([place]) 06/07/2010  . Cancer of central portion of right female breast ([place]) 10/04/2009  . Idiopathic thrombocytopenic purpura (ITP) ([place]) 06/22/2009  . Essential hypertension 06/22/2009  . HYPERPARATHYROIDISM, HX OF 06/22/2009  . Avitaminosis D 09/01/2008    Past Surgical History:  Procedure Laterality Date  . ABDOMINAL HYSTERECTOMY     1996 per pt  . BREAST BIOPSY Right 06/2009  . BREAST SURGERY  09/12/10   excision of skin mass - right breast, had another surgery to remove more Cancer cells  . INNER EAR SURGERY  06/01/13  . MASTECTOMY  Right 07/21/2009  . PARATHYROIDECTOMY     partial?  . REDUCTION MAMMAPLASTY Left 2013    OB History   No obstetric history on file.      Home Medications    Prior to Admission medications   Medication Sig Start Date End Date Taking? Authorizing Provider  acetaminophen (TYLENOL) 325 MG tablet Take 2 tablets (650 mg total) by mouth every 6 (six) hours as needed. 03/30/17   Couture, Cortni S, PA-C  Blood Glucose Monitoring Suppl (ONE TOUCH ULTRA 2) w/Device KIT USE   TO CHECK GLUCOSE UP TO THREE TIMES DAILY AS NEEDED 10/11/15   McKeag, Marylynn Pearson, MD  carvedilol (COREG) 12.5 MG tablet Take 1 tablet (12.5 mg total) by mouth 2 (two) times daily with a meal. 10/31/17 04/29/18  Bland, Scott, DO  fluticasone (FLONASE) 50 MCG/ACT nasal spray Place 2 sprays into both nostrils daily. 01/25/18   Tasia Catchings, Starlit Raburn V, PA-C  furosemide (LASIX) 20 MG tablet Take 1 tablet (20 mg total) by mouth every other day. 12/02/17   [name], DO  glucose blood (ONE TOUCH ULTRA TEST) test strip Check blood sugar once daily 10/27/15   Haney, Alyssa A, MD  hydrochlorothiazide (MICROZIDE) 12.5 MG capsule Take 1 capsule (12.5 mg total) by mouth daily. 08/13/17   [name], DO  Lancets [facility] ULTRASOFT) lancets Check daily as needed 07/19/15   Olam Idler, MD  lisinopril (PRINIVIL,ZESTRIL) 40 MG tablet  Take 1 tablet (40 mg total) daily by mouth. 12/12/16   Nicolette Bang, DO  metFORMIN (GLUCOPHAGE-XR) 500 MG 24 hr tablet Take 1 tablet (500 mg total) by mouth daily with breakfast. 10/13/17   [name], DO  ofloxacin (OCUFLOX) 0.3 % ophthalmic solution Place 1 drop into the right eye 4 (four) times daily for 7 days. 01/25/18 02/01/18  Tasia Catchings, Perline Awe V, PA-C  rosuvastatin (CRESTOR) 10 MG tablet Take 1 tablet (10 mg total) by mouth 3 (three) times a week. 12/08/17   [name], DO  tamoxifen (NOLVADEX) 20 MG tablet Take 1 tablet (20 mg total) by mouth daily. 11/24/17   Nicholas Lose, MD  traMADol (ULTRAM) 50 MG tablet Take 1  tablet (50 mg total) by mouth 2 (two) times daily as needed. 01/16/18   [name], DO    Family History Family History  Problem Relation Age of Onset  . CAD Mother 13       CABG  . Stroke Father   . Heart attack Brother 68  . Colon cancer Neg Hx   . Esophageal cancer Neg Hx   . Rectal cancer Neg Hx   . Stomach cancer Neg Hx     Social History Social History   Tobacco Use  . Smoking status: Never Smoker  . Smokeless tobacco: Never Used  Substance Use Topics  . Alcohol use: No  . Drug use: No     Allergies   Patient has no known allergies.   Review of Systems Review of Systems  Reason unable to perform ROS: See HPI as above.     Physical Exam Triage Vital Signs ED Triage Vitals  Enc Vitals Group     BP 01/25/18 1010 (!) 155/74     Pulse Rate 01/25/18 1010 76     Resp 01/25/18 1010 18     Temp 01/25/18 1010 98.5 F (36.9 C)     Temp Source 01/25/18 1010 Oral     SpO2 01/25/18 1010 96 %     Weight --      Height --      Head Circumference --      Peak Flow --      Pain Score 01/25/18 1016 3     Pain Loc --      Pain Edu? --      Excl. in [place]? --    No data found.  Updated Vital Signs BP (!) 155/74 (BP Location: Left Arm)   Pulse 76   Temp 98.5 F (36.9 C) (Oral)   Resp 18   SpO2 96%   Visual Acuity Right Eye Distance: 20/50 Left Eye Distance: 20/50 Bilateral Distance: 20/50  Right Eye Near:   Left Eye Near:    Bilateral Near:     Physical Exam Constitutional:      General: She is not in acute distress.    Appearance: She is well-developed. She is not ill-appearing, toxic-appearing or diaphoretic.  HENT:     Head: Normocephalic and atraumatic.     Right Ear: Tympanic membrane, ear canal and external ear normal. Tympanic membrane is not erythematous or bulging.     Left Ear: Tympanic membrane, ear canal and external ear normal. Tympanic membrane is not erythematous or bulging.     Nose: Nose normal.     Right Sinus: No maxillary sinus  tenderness or frontal sinus tenderness.     Left Sinus: No maxillary sinus tenderness or frontal sinus tenderness.     Mouth/Throat:  Pharynx: Uvula midline.  Eyes:     Conjunctiva/sclera:     Right eye: Right conjunctiva is injected.     Left eye: Left conjunctiva is not injected.     Pupils: Pupils are equal, round, and reactive to light.     Comments: Right upper eyelid swelling without erythema, warmth, tenderness to palpation. No ciliary injection of the right eye. No photophobia on exam.   Neck:     Musculoskeletal: Normal range of motion and neck supple.  Cardiovascular:     Rate and Rhythm: Normal rate and regular rhythm.     Heart sounds: Normal heart sounds. No murmur. No friction rub. No gallop.   Pulmonary:     Effort: Pulmonary effort is normal.     Breath sounds: Normal breath sounds. No decreased breath sounds, wheezing, rhonchi or rales.  Lymphadenopathy:     Cervical: No cervical adenopathy.  Skin:    General: Skin is warm and dry.  Neurological:     Mental Status: She is alert and oriented to person, place, and time.  Psychiatric:        Behavior: Behavior normal.        Judgment: Judgment normal.      UC Treatments / Results  Labs (all labs ordered are listed, but only abnormal results are displayed) Labs Reviewed - No data to display  EKG None  Radiology No results found.  Procedures Procedures (including critical care time)  Medications Ordered in UC Medications - No data to display  Initial Impression / Assessment and Plan / UC Course  I have reviewed the triage vital signs and the nursing notes.  Pertinent labs & imaging results that were available during my care of the patient were reviewed by me and considered in my medical decision making (see chart for details).    Will cover for bacterial conjunctivitis with ofloxacin. Discussed possibility of viral conjunctivitis. Other symptomatic treatment discussed. Return precautions given.  Patient expresses understanding and agrees to plan.  Final Clinical Impressions(s) / UC Diagnoses   Final diagnoses:  Acute conjunctivitis of right eye, unspecified acute conjunctivitis type  Viral illness    ED Prescriptions    Medication Sig Dispense Auth. Provider   ofloxacin (OCUFLOX) 0.3 % ophthalmic solution Place 1 drop into the right eye 4 (four) times daily for 7 days. 1.4 mL Darcy Cordner V, PA-C   fluticasone (FLONASE) 50 MCG/ACT nasal spray Place 2 sprays into both nostrils daily. 1 g Tobin Chad, [place] 01/25/18 1038

## 2018-01-25 NOTE — Discharge Instructions (Signed)
Use ofloxacin eyedrops as directed on right eye. Artificial tear gel at night (genteal, systane). Wait 10-15 minutes between drops, always use artificial tear gel last, as it prevents drops from penetrating through. Lid scrubs and warm compresses as directed. Monitor for any worsening of symptoms, changes in vision, sensitivity to light, eye swelling, painful eye movement, follow up with ophthalmology for further evaluation.

## 2018-01-28 ENCOUNTER — Encounter (HOSPITAL_COMMUNITY): Payer: Self-pay

## 2018-01-28 ENCOUNTER — Emergency Department (HOSPITAL_COMMUNITY)
Admission: EM | Admit: 2018-01-28 | Discharge: 2018-01-28 | Disposition: A | Discharge status: Home / Self Care | Payer: 59 | Attending: Emergency Medicine | Admitting: Emergency Medicine

## 2018-01-28 ENCOUNTER — Emergency Department (HOSPITAL_COMMUNITY): Payer: 59

## 2018-01-28 ENCOUNTER — Other Ambulatory Visit: Payer: Self-pay

## 2018-01-28 DIAGNOSIS — E119 Type 2 diabetes mellitus without complications: Secondary | ICD-10-CM | POA: Diagnosis not present

## 2018-01-28 DIAGNOSIS — R05 Cough: Secondary | ICD-10-CM | POA: Diagnosis not present

## 2018-01-28 DIAGNOSIS — I10 Essential (primary) hypertension: Secondary | ICD-10-CM | POA: Diagnosis not present

## 2018-01-28 DIAGNOSIS — R062 Wheezing: Secondary | ICD-10-CM | POA: Diagnosis present

## 2018-01-28 DIAGNOSIS — E785 Hyperlipidemia, unspecified: Secondary | ICD-10-CM | POA: Diagnosis not present

## 2018-01-28 DIAGNOSIS — Z853 Personal history of malignant neoplasm of breast: Secondary | ICD-10-CM | POA: Insufficient documentation

## 2018-01-28 DIAGNOSIS — J069 Acute upper respiratory infection, unspecified: Secondary | ICD-10-CM | POA: Diagnosis not present

## 2018-01-28 MED ORDER — ALBUTEROL SULFATE HFA 108 (90 BASE) MCG/ACT IN AERS
1.0000 | INHALATION_SPRAY | Freq: Four times a day (QID) | RESPIRATORY_TRACT | Status: DC | PRN
  Administered 2018-01-28: 2 via RESPIRATORY_TRACT
  Filled 2018-01-28: qty 6.7

## 2018-01-28 MED ORDER — BENZONATATE 100 MG PO CAPS: 100.0000 mg | ORAL_CAPSULE | Freq: Three times a day (TID) | ORAL | 0 refills | Status: DC

## 2018-01-28 NOTE — ED Notes (Signed)
Patient ambulated to XR.

## 2018-01-28 NOTE — Discharge Instructions (Addendum)
Take the medications as prescribed, follow up with your primary care doctor next week if not improving

## 2018-01-28 NOTE — ED Provider Notes (Signed)
[facility] DEPT Provider Note   CSN: [id number] Arrival date & time: 01/28/18  0710     History   Chief Complaint Chief Complaint  Patient presents with  . URI    HPI [name] is a 57 y.o. female.  The history is provided by the patient.  URI   The current episode started yesterday. There has been no fever. Associated symptoms include wheezing. Pertinent negatives include no chest pain, no nausea, no vomiting, no congestion, no headaches, no rhinorrhea and no sinus pain.  Patient states she has had some mild URI symptoms the last couple of days.  Has had some nasal congestion as well as a cough.  3 days ago she went to an urgent care and was treated for conjunctivitis.  This morning when she woke up she felt short of breath and felt that she was wheezing.  She denies any history of having any asthma trouble.  She denies any smoking history.  Past Medical History:  Diagnosis Date  . Arthritis   . Breast CA ([place])    breast CA- right breast  . Diabetes mellitus   . Heel spur    bilateral  . Hyperlipidemia   . Hypertension   . Thrombocytopenia ([place])   . Thyroid disease    parathyroid removed    Patient Active Problem List   Diagnosis Date Noted  . Need for immunization against influenza 12/04/2017  . Arthritis of both hands 04/03/2016  . Obesity 08/30/2015  . 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 Cataracts, bilateral 09/13/2014  . Glaucoma 09/13/2014  . Mitral regurgitation 02/18/2014  . Hip pain 01/07/2014  . Transaminitis 11/13/2012  . Hyperlipidemia 04/04/2011  . Diabetes ([place]) 06/07/2010  . Cancer of central portion of right female breast ([place]) 10/04/2009  . Idiopathic thrombocytopenic purpura (ITP) ([place]) 06/22/2009  . Essential hypertension 06/22/2009  . HYPERPARATHYROIDISM, HX OF 06/22/2009  . Avitaminosis D 09/01/2008    Past Surgical History:  Procedure Laterality Date  . ABDOMINAL HYSTERECTOMY     1996 per pt  . BREAST BIOPSY Right 06/2009  . BREAST SURGERY  09/12/10   excision of skin mass - right breast, had another surgery to remove more Cancer cells  . INNER EAR SURGERY  06/01/13  . MASTECTOMY Right 07/21/2009  . PARATHYROIDECTOMY     partial?  . REDUCTION MAMMAPLASTY Left 2013     OB History   No obstetric history on file.      Home Medications    Prior to Admission medications   Medication Sig Start Date End Date Taking? Authorizing Provider  acetaminophen (TYLENOL) 325 MG tablet Take 2 tablets (650 mg total) by mouth every 6 (six) hours as needed. 03/30/17   Couture, Cortni S, PA-C  benzonatate (TESSALON) 100 MG capsule Take 1 capsule (100 mg total) by mouth every 8 (eight) hours. 01/28/18   Dorie Rank, MD  Blood Glucose Monitoring Suppl (ONE TOUCH ULTRA 2) w/Device KIT USE   TO CHECK GLUCOSE UP TO THREE TIMES DAILY AS NEEDED 10/11/15   McKeag, Marylynn Pearson, MD  carvedilol (COREG) 12.5 MG tablet Take 1 tablet (12.5 mg total) by mouth 2 (two) times daily with a meal. 10/31/17 04/29/18  Bland, Scott, DO  fluticasone (FLONASE) 50 MCG/ACT nasal spray Place 2 sprays into both nostrils daily. 01/25/18   Tasia Catchings, Amy V, PA-C  furosemide (LASIX) 20 MG tablet Take 1 tablet (20 mg total) by mouth every other day. 12/02/17   [name],  DO  glucose blood (ONE TOUCH ULTRA TEST) test strip Check blood sugar once daily 10/27/15   Haney, Alyssa A, MD  hydrochlorothiazide (MICROZIDE) 12.5 MG capsule Take 1 capsule (12.5 mg total) by mouth daily. 08/13/17   [name], DO  Lancets [facility] ULTRASOFT) lancets Check daily as needed 07/19/15   Olam Idler, MD  lisinopril (PRINIVIL,ZESTRIL) 40 MG tablet Take 1 tablet (40 mg total) daily by mouth. 12/12/16   Nicolette Bang, DO  metFORMIN (GLUCOPHAGE-XR) 500 MG 24 hr tablet Take 1 tablet (500 mg total) by mouth daily with breakfast. 10/13/17   [name], DO  ofloxacin (OCUFLOX) 0.3 % ophthalmic solution Place 1 drop into the right eye 4 (four)  times daily for 7 days. 01/25/18 02/01/18  Tasia Catchings, Amy V, PA-C  rosuvastatin (CRESTOR) 10 MG tablet Take 1 tablet (10 mg total) by mouth 3 (three) times a week. 12/08/17   [name], DO  tamoxifen (NOLVADEX) 20 MG tablet Take 1 tablet (20 mg total) by mouth daily. 11/24/17   Nicholas Lose, MD  traMADol (ULTRAM) 50 MG tablet Take 1 tablet (50 mg total) by mouth 2 (two) times daily as needed. 01/16/18   [name], DO    Family History Family History  Problem Relation Age of Onset  . CAD Mother 66       CABG  . Stroke Father   . Heart attack Brother 58  . Colon cancer Neg Hx   . Esophageal cancer Neg Hx   . Rectal cancer Neg Hx   . Stomach cancer Neg Hx     Social History Social History   Tobacco Use  . Smoking status: Never Smoker  . Smokeless tobacco: Never Used  Substance Use Topics  . Alcohol use: No  . Drug use: No     Allergies   Patient has no known allergies.   Review of Systems Review of Systems  HENT: Negative for congestion, rhinorrhea and sinus pain.   Respiratory: Positive for wheezing.   Cardiovascular: Negative for chest pain.  Gastrointestinal: Negative for nausea and vomiting.  Neurological: Negative for headaches.  All other systems reviewed and are negative.    Physical Exam Updated Vital Signs BP (!) 157/75 (BP Location: Left Arm)   Pulse 81   Temp 98.9 F (37.2 C) (Oral)   Resp 16   Ht 1.6 m ('5\' 3"' )   Wt 89.7 kg   SpO2 97%   BMI 35.04 kg/m   Physical Exam Vitals signs and nursing note reviewed.  Constitutional:      General: She is not in acute distress.    Appearance: She is well-developed.  HENT:     Head: Normocephalic and atraumatic.     Comments: Mild conjunctival injection    Right Ear: External ear normal.     Left Ear: External ear normal.  Eyes:     General: No scleral icterus.       Right eye: No discharge.        Left eye: No discharge.     Conjunctiva/sclera: Conjunctivae normal.  Neck:     Musculoskeletal: Neck  supple.     Trachea: No tracheal deviation.  Cardiovascular:     Rate and Rhythm: Normal rate and regular rhythm.  Pulmonary:     Effort: Pulmonary effort is normal. No respiratory distress.     Breath sounds: Normal breath sounds. No stridor. No wheezing or rales.     Comments: Speaking in full sentences without any difficulty Abdominal:  General: Bowel sounds are normal. There is no distension.     Palpations: Abdomen is soft.     Tenderness: There is no abdominal tenderness. There is no guarding or rebound.  Musculoskeletal:        General: No tenderness.  Skin:    General: Skin is warm and dry.     Findings: No rash.  Neurological:     Mental Status: She is alert.     Cranial Nerves: No cranial nerve deficit (no facial droop, extraocular movements intact, no slurred speech).     Sensory: No sensory deficit.     Motor: No abnormal muscle tone or seizure activity.     Coordination: Coordination normal.      ED Treatments / Results  Labs (all labs ordered are listed, but only abnormal results are displayed) Labs Reviewed - No data to display  EKG None  Radiology Dg Chest 2 View  Result Date: 01/28/2018 CLINICAL DATA:  Three-week history of wheezing and cough. EXAM: CHEST - 2 VIEW COMPARISON:  06/28/2024 FINDINGS: The lungs are clear without focal pneumonia, edema, pneumothorax or pleural effusion. The cardiopericardial silhouette is within normal limits for size. The visualized bony structures of the thorax are intact. IMPRESSION: No active cardiopulmonary disease. Electronically Signed   By: Misty Stanley M.D.   On: 01/28/2018 08:16    Procedures Procedures (including critical care time)  Medications Ordered in ED Medications  albuterol (PROVENTIL HFA;VENTOLIN HFA) 108 (90 Base) MCG/ACT inhaler 1-2 puff (has no administration in time range)     Initial Impression / Assessment and Plan / ED Course  I have reviewed the triage vital signs and the nursing  notes.  Pertinent labs & imaging results that were available during my care of the patient were reviewed by me and considered in my medical decision making (see chart for details).   CXR negative.  NO wheezing on exam.  Consistent with viral illness.  Mild uri.  Will dc home with tessalon rx.  Alb hfa prn  Final Clinical Impressions(s) / ED Diagnoses   Final diagnoses:  Upper respiratory tract infection, unspecified type    ED Discharge Orders         Ordered    benzonatate (TESSALON) 100 MG capsule  Every 8 hours     01/28/18 8871           Dorie Rank, MD 01/28/18 [phone]

## 2018-01-28 NOTE — ED Triage Notes (Signed)
Pt states she has been sick for 3 weeks. Pt states that she has been wheezing and has had a bad cough. Pt states she was seen for the same 3 days ago at [facility] urgent care but has not had relief.

## 2018-02-02 ENCOUNTER — Telehealth: Payer: Self-pay | Admitting: Family Medicine

## 2018-02-02 ENCOUNTER — Other Ambulatory Visit: Payer: Self-pay | Admitting: Family Medicine

## 2018-02-02 DIAGNOSIS — I10 Essential (primary) hypertension: Secondary | ICD-10-CM

## 2018-02-02 MED ORDER — LISINOPRIL 40 MG PO TABS: 40.0000 mg | ORAL_TABLET | Freq: Every day | ORAL | 3 refills | Status: DC

## 2018-02-02 NOTE — Progress Notes (Signed)
Med refill, lisinopril

## 2018-02-02 NOTE — Telephone Encounter (Signed)
Pt would like to have her Lisinopril refilled and sent to [facility] on [address].

## 2018-03-25 ENCOUNTER — Other Ambulatory Visit: Payer: Self-pay | Admitting: Hematology and Oncology

## 2018-03-25 DIAGNOSIS — Z1231 Encounter for screening mammogram for malignant neoplasm of breast: Secondary | ICD-10-CM

## 2018-04-10 ENCOUNTER — Ambulatory Visit (INDEPENDENT_AMBULATORY_CARE_PROVIDER_SITE_OTHER): Payer: 59 | Admitting: Family Medicine

## 2018-04-10 ENCOUNTER — Encounter: Payer: Self-pay | Admitting: Family Medicine

## 2018-04-10 ENCOUNTER — Other Ambulatory Visit: Payer: Self-pay

## 2018-04-10 VITALS — BP 134/88 | HR 64 | Temp 98.3°F | Ht 64.0 in | Wt 198.0 lb

## 2018-04-10 DIAGNOSIS — I10 Essential (primary) hypertension: Secondary | ICD-10-CM

## 2018-04-10 DIAGNOSIS — E785 Hyperlipidemia, unspecified: Secondary | ICD-10-CM | POA: Diagnosis not present

## 2018-04-10 DIAGNOSIS — E11319 Type 2 diabetes mellitus with unspecified diabetic retinopathy without macular edema: Secondary | ICD-10-CM

## 2018-04-10 LAB — POCT GLYCOSYLATED HEMOGLOBIN (HGB A1C): HbA1c, POC (controlled diabetic range): 6.5 % (ref 0.0–7.0)

## 2018-04-10 NOTE — Assessment & Plan Note (Signed)
Tolerating current statin well, will order annual recheck of lipids

## 2018-04-10 NOTE — Progress Notes (Signed)
    Subjective:  [name] is a 58 y.o. female who presents to the [facility] today with a chief complaint of medication management.   HPI: Essential hypertension Patient with well-controlled hypertension at 130 today.  Has been 120s in the past, had self stopped Lasix because she was concerned about duplication of "water pills "with that and HCTZ.  We discussed that her increased leg swelling and slightly elevated blood pressure from past is likely due to stopping the furosemide.  After discussion about the mechanism and purpose of these medications she is decided to start at least every other day on her Lasix 4 months to see how that goes.    Hyperlipidemia Tolerating current statin well, will order annual recheck of lipids  Diabetes ([name]) No new complaints, does follow with eye doctor for cataracts and glaucoma.  No significant new polyuria or foot issues.  Recheck of A1c in office today shows to be consistent with 6.5 on metformin.    Objective:  Physical Exam: BP 134/88   Pulse 64   Temp 98.3 F (36.8 C) (Oral)   Ht 5\' 4"  (1.626 m)   Wt 198 lb (89.8 kg)   SpO2 99%   BMI 33.99 kg/m   Gen: NAD, resting comfortably CV: RRR with no murmurs appreciated Pulm: NWOB, CTAB with no crackles, wheezes, or rhonchi GI: Normal bowel sounds present. Soft, Nontender, Nondistended. MSK: no edema, cyanosis, or clubbing noted Skin: warm, dry Neuro: grossly normal, moves all extremities Psych: Normal affect and thought content  No results found for this or any previous visit (from the past 72 hour(s)).   Assessment/Plan:  Hyperlipidemia Tolerating current statin well, will order annual recheck of lipids  Diabetes ([name]) No new complaints, does follow with eye doctor for cataracts and glaucoma.  No significant new polyuria or foot issues.  Recheck of A1c in office today shows to be consistent with 6.5 on metformin.  Try to call patient and had to leave message with family member for her to  call back  Essential hypertension Patient with well-controlled hypertension at 130 today.  Has been 120s in the past, had self stopped Lasix because she was concerned about duplication of "water pills "with that and HCTZ.  We discussed that her increased leg swelling and slightly elevated blood pressure from past is likely due to stopping the furosemide.  After discussion about the mechanism and purpose of these medications she is decided to start at least every other day on her Lasix 4 months to see how that goes.  At that point she will contact me again we can discuss further plans   [name], [place] - PGY2 04/14/2018 2:43 PM

## 2018-04-10 NOTE — Assessment & Plan Note (Signed)
Patient with well-controlled hypertension at 130 today.  Has been 120s in the past, had self stopped Lasix because she was concerned about duplication of "water pills "with that and HCTZ.  We discussed that her increased leg swelling and slightly elevated blood pressure from past is likely due to stopping the furosemide.  After discussion about the mechanism and purpose of these medications she is decided to start at least every other day on her Lasix 4 months to see how that goes.  At that point she will contact me again we can discuss further plans

## 2018-04-10 NOTE — Assessment & Plan Note (Signed)
No new complaints, does follow with eye doctor for cataracts and glaucoma.  No significant new polyuria or foot issues.  Recheck of A1c in office today shows to be consistent with 6.5 on metformin.  Try to call patient and had to leave message with family member for her to call back

## 2018-04-10 NOTE — Assessment & Plan Note (Signed)
>>  ASSESSMENT AND PLAN FOR DM (DIABETES MELLITUS) WITH COMPLICATIONS (HCC) WRITTEN ON 04/10/2018 10:58 AM BY BLAND, SCOTT, DO  No new complaints, does follow with eye doctor for cataracts and glaucoma.  No significant new polyuria or foot issues.  Recheck of A1c in office today shows to be consistent with 6.5 on metformin .  Try to call patient and had to leave message with family member for her to call back

## 2018-04-11 LAB — LIPID PANEL
Chol/HDL Ratio: 2.5 ratio (ref 0.0–4.4)
Cholesterol, Total: 188 mg/dL (ref 100–199)
HDL: 76 mg/dL (ref >39)
LDL Calculated: 81 mg/dL (ref 0–99)
Triglycerides: 157 mg/dL — ABNORMAL HIGH (ref 0–149)
VLDL Cholesterol Cal: 31 mg/dL (ref 5–40)

## 2018-04-14 ENCOUNTER — Encounter: Payer: Self-pay | Admitting: Family Medicine

## 2018-04-19 ENCOUNTER — Other Ambulatory Visit: Payer: Self-pay | Admitting: Family Medicine

## 2018-04-19 DIAGNOSIS — I10 Essential (primary) hypertension: Secondary | ICD-10-CM

## 2018-04-21 ENCOUNTER — Telehealth: Payer: Self-pay | Admitting: Family Medicine

## 2018-04-21 ENCOUNTER — Other Ambulatory Visit: Payer: Self-pay | Admitting: Family Medicine

## 2018-04-21 DIAGNOSIS — I10 Essential (primary) hypertension: Secondary | ICD-10-CM

## 2018-04-21 NOTE — Telephone Encounter (Signed)
Called patient to discuss her reaction to restarting her Lasix.  She has been using this every other day and says that she has had good diuretic response, swelling in her legs has been reduced.  She is feeling well  Discussed that it is appropriate to recheck her kidney function now that she has been restarted this, will place order for lab visit and she will call before she comes in to set appointment.  Will refill Lasix  -Dr. Criss Rosales

## 2018-04-22 ENCOUNTER — Other Ambulatory Visit: Payer: 59

## 2018-04-22 ENCOUNTER — Other Ambulatory Visit: Payer: Self-pay

## 2018-04-22 DIAGNOSIS — I10 Essential (primary) hypertension: Secondary | ICD-10-CM

## 2018-04-23 ENCOUNTER — Other Ambulatory Visit: Payer: Self-pay | Admitting: Family Medicine

## 2018-04-23 DIAGNOSIS — I10 Essential (primary) hypertension: Secondary | ICD-10-CM

## 2018-04-23 LAB — BASIC METABOLIC PANEL
BUN/Creatinine Ratio: 10 (ref 9–23)
BUN: 10 mg/dL (ref 6–24)
CO2: 24 mmol/L (ref 20–29)
Calcium: 10 mg/dL (ref 8.7–10.2)
Chloride: 100 mmol/L (ref 96–106)
Creatinine, Ser: 0.99 mg/dL (ref 0.57–1.00)
GFR calc Af Amer: 73 mL/min/{1.73_m2} (ref >59)
GFR calc non Af Amer: 63 mL/min/{1.73_m2} (ref >59)
Glucose: 105 mg/dL — ABNORMAL HIGH (ref 65–99)
Potassium: 4.6 mmol/L (ref 3.5–5.2)
Sodium: 140 mmol/L (ref 134–144)

## 2018-04-24 ENCOUNTER — Other Ambulatory Visit: Payer: Self-pay | Admitting: Family Medicine

## 2018-04-26 ENCOUNTER — Other Ambulatory Visit: Payer: Self-pay | Admitting: Family Medicine

## 2018-04-26 DIAGNOSIS — I10 Essential (primary) hypertension: Secondary | ICD-10-CM

## 2018-05-04 ENCOUNTER — Inpatient Hospital Stay: Admission: RE | Admit: 2018-05-04 | Payer: Medicare Other | Source: Ambulatory Visit

## 2018-05-18 ENCOUNTER — Telehealth: Payer: Self-pay | Admitting: *Deleted

## 2018-05-18 NOTE — Telephone Encounter (Signed)
Pt calls because she thinks that she is due for labwork to recheck her kidneys.  No future orders, and no timeframe notated.  Will forward to MD. Christen Bame, CMA

## 2018-05-18 NOTE — Telephone Encounter (Signed)
Pt informed.  Jessica Fleeger, CMA  

## 2018-05-28 ENCOUNTER — Ambulatory Visit: Payer: Medicare Other

## 2018-06-25 ENCOUNTER — Other Ambulatory Visit: Payer: Self-pay | Admitting: Family Medicine

## 2018-07-07 DIAGNOSIS — M7662 Achilles tendinitis, left leg: Secondary | ICD-10-CM | POA: Diagnosis not present

## 2018-07-07 DIAGNOSIS — M71571 Other bursitis, not elsewhere classified, right ankle and foot: Secondary | ICD-10-CM | POA: Diagnosis not present

## 2018-07-07 DIAGNOSIS — M7731 Calcaneal spur, right foot: Secondary | ICD-10-CM | POA: Diagnosis not present

## 2018-07-07 DIAGNOSIS — M71572 Other bursitis, not elsewhere classified, left ankle and foot: Secondary | ICD-10-CM | POA: Diagnosis not present

## 2018-07-07 DIAGNOSIS — M7732 Calcaneal spur, left foot: Secondary | ICD-10-CM | POA: Diagnosis not present

## 2018-07-07 DIAGNOSIS — M7661 Achilles tendinitis, right leg: Secondary | ICD-10-CM | POA: Diagnosis not present

## 2018-07-13 ENCOUNTER — Ambulatory Visit
Admission: RE | Admit: 2018-07-13 | Discharge: 2018-07-13 | Disposition: A | Discharge status: Home / Self Care | Payer: Medicare Other | Source: Ambulatory Visit | Attending: Hematology and Oncology | Admitting: Hematology and Oncology

## 2018-07-13 ENCOUNTER — Other Ambulatory Visit: Payer: Self-pay

## 2018-07-13 DIAGNOSIS — Z1231 Encounter for screening mammogram for malignant neoplasm of breast: Secondary | ICD-10-CM

## 2018-07-14 ENCOUNTER — Ambulatory Visit (INDEPENDENT_AMBULATORY_CARE_PROVIDER_SITE_OTHER): Payer: Medicare Other | Admitting: Family Medicine

## 2018-07-14 VITALS — BP 140/80 | HR 75 | Wt 202.0 lb

## 2018-07-14 DIAGNOSIS — E11319 Type 2 diabetes mellitus with unspecified diabetic retinopathy without macular edema: Secondary | ICD-10-CM | POA: Diagnosis not present

## 2018-07-14 DIAGNOSIS — R748 Abnormal levels of other serum enzymes: Secondary | ICD-10-CM

## 2018-07-14 DIAGNOSIS — I340 Nonrheumatic mitral (valve) insufficiency: Secondary | ICD-10-CM

## 2018-07-14 DIAGNOSIS — R601 Generalized edema: Secondary | ICD-10-CM

## 2018-07-14 LAB — POCT GLYCOSYLATED HEMOGLOBIN (HGB A1C): Hemoglobin A1C: 7.6 % — AB (ref 4.0–5.6)

## 2018-07-14 NOTE — Progress Notes (Signed)
Subjective:  [name] is a 58 y.o. female who presents to the [facility] today with a chief complaint of "puffiness".   HPI: Diabetes ([place]) Results of new A1c are still pending during exam, foot exam negative, patient says she has been compliant with all her medications said no significant events.  Generalized edema Patient with complaint of generalized edema.  Last echo was in 2016, but I cannot seem to find the actual results from this although it shows it was finalized.  Last time she saw cardiology was in 2018 she is on lisinopril, HCTZ, furosemide, carvedilol.  No shortness of breath, no orthopnea.  Slight puffiness seems to be on hands and on feet.  This does not appear to be pitting edema.   Objective:  Physical Exam: BP 140/80 (BP Location: Left Arm, Patient Position: Sitting, Cuff Size: Large)   Pulse 75   Wt 91.6 kg   SpO2 97%   BMI 34.67 kg/m   Gen: NAD, resting comfortably CV: RRR with no murmurs appreciated Pulm: NWOB, CTAB with no crackles, wheezes, or rhonchi GI: Normal bowel sounds present. Soft, Nontender, Nondistended. MSK: mild edema but not pitting to hands/feet, cyanosis, or clubbing noted Skin: warm, dry Neuro: grossly normal, moves all extremities Psych: Normal affect and thought content  Results for orders placed or performed in visit on 07/14/18 (from the past 72 hour(s))  TSH     Status: None   Collection Time: 07/14/18  1:52 PM  Result Value Ref Range   TSH 0.976 0.450 - 4.500 uIU/mL  Comprehensive metabolic panel     Status: Abnormal   Collection Time: 07/14/18  1:52 PM  Result Value Ref Range   Glucose 179 (H) 65 - 99 mg/dL   BUN 16 6 - 24 mg/dL   Creatinine, Ser 1.00 0.57 - 1.00 mg/dL   GFR calc non Af Amer 62 >59 mL/min/1.73   GFR calc Af Amer 72 >59 mL/min/1.73   BUN/Creatinine Ratio 16 9 - 23   Sodium 139 134 - 144 mmol/L   Potassium 4.1 3.5 - 5.2 mmol/L   Chloride 98 96 - 106 mmol/L   CO2 23 20 - 29 mmol/L   Calcium 10.0 8.7 - 10.2  mg/dL   Total Protein 6.9 6.0 - 8.5 g/dL   Albumin 4.0 3.8 - 4.9 g/dL   Globulin, Total 2.9 1.5 - 4.5 g/dL   Albumin/Globulin Ratio 1.4 1.2 - 2.2   Bilirubin Total 0.2 0.0 - 1.2 mg/dL   Alkaline Phosphatase 48 39 - 117 IU/L   AST 47 (H) 0 - 40 IU/L   ALT 40 (H) 0 - 32 IU/L  CBC     Status: Abnormal   Collection Time: 07/14/18  1:52 PM  Result Value Ref Range   WBC 11.4 (H) 3.4 - 10.8 x10E3/uL   RBC 5.26 3.77 - 5.28 x10E6/uL   Hemoglobin 12.8 11.1 - 15.9 g/dL   Hematocrit 38.8 34.0 - 46.6 %   MCV 74 (L) 79 - 97 fL   MCH 24.3 (L) 26.6 - 33.0 pg   MCHC 33.0 31.5 - 35.7 g/dL   RDW 14.5 11.7 - 15.4 %   Platelets 92 (LL) 150 - 450 x10E3/uL    Comment: Actual platelet count may be somewhat higher than reported due to aggregation of platelets in this sample.    Hematology Comments: Note:     Comment: Verified by microscopic examination.  HgB A1c     Status: Abnormal   Collection Time: 07/14/18  2:03 PM  Result Value Ref Range   Hemoglobin A1C 7.6 (A) 4.0 - 5.6 %   HbA1c POC (<> result, manual entry)     HbA1c, POC (prediabetic range)     HbA1c, POC (controlled diabetic range)       Assessment/Plan:  Diabetes ([place]) Results of new A1c are up to 7.6 from 6.5, foot exam negative, patient says she has been compliant with all her medications said no significant events.  We discussed need to improve diet, will increase XR metformin to 1000mg  daily from 500  Generalized edema Patient with complaint of generalized edema.  Last echo was in 2016, but I cannot seem to find the actual results from this although it shows it was finalized.  Last time she saw cardiology was in 2018 she is on lisinopril, HCTZ, furosemide, carvedilol.  No shortness of breath, no orthopnea.  Slight puffiness seems to be on hands and on feet.  This does not appear to be pitting edema.  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  Patient notified on phone personally.  Mitral regurgitation Can't find read of last echo in the chart.  Will order echo given new complaint of worsening edema   [name], [place] - PGY2 07/16/2018 10:51 AM

## 2018-07-14 NOTE — Assessment & Plan Note (Addendum)
Results of new A1c are up to 7.6 from 6.5, foot exam negative, patient says she has been compliant with all her medications said no significant events.  We discussed need to improve diet, will increase XR metformin to 1000mg  daily from 500

## 2018-07-14 NOTE — Assessment & Plan Note (Signed)
Patient with complaint of generalized edema.  Last echo was in 2016, but I cannot seem to find the actual results from this although it shows it was finalized.  Last time she saw cardiology was in 2018 she is on lisinopril, HCTZ, furosemide, carvedilol.  No shortness of breath, no orthopnea.  Slight puffiness seems to be on hands and on feet.  This does not appear to be pitting edema.

## 2018-07-14 NOTE — Assessment & Plan Note (Signed)
>>  ASSESSMENT AND PLAN FOR DM (DIABETES MELLITUS) WITH COMPLICATIONS (HCC) WRITTEN ON 07/16/2018 10:49 AM BY BLAND, SCOTT, DO  Results of new A1c are up to 7.6 from 6.5, foot exam negative, patient says she has been compliant with all her medications said no significant events.  We discussed need to improve diet, will increase XR metformin  to 1000mg  daily from 500

## 2018-07-14 NOTE — Patient Instructions (Signed)
It was a pleasure to see you today! Thank you for choosing [facility] for your primary care. [name] was seen for swelling. Come back to the clinic after you see the cardiologist   Today we ordered some lab work to follow-up on your diabetes and on the swelling that you have been complaining about.  We also ordered an echo which is a picture of your heart.  Please call the cardiologist and arrange a follow-up because you have not seen them in a few years, if they need a new referral from [place] we will go ahead and order that for you just let us know.   Please bring all your medications to every doctors visit   Sign up for My Chart to have easy access to your labs results, and communication with your Primary care physician.     Please check-out at the front desk before leaving the clinic.     Best,  Dr. [name] FAMILY MEDICINE RESIDENT - PGY2 07/14/2018 1:48 PM

## 2018-07-15 ENCOUNTER — Telehealth: Payer: Self-pay | Admitting: Family Medicine

## 2018-07-15 LAB — CBC
Hematocrit: 38.8 % (ref 34.0–46.6)
Hemoglobin: 12.8 g/dL (ref 11.1–15.9)
MCH: 24.3 pg — ABNORMAL LOW (ref 26.6–33.0)
MCHC: 33 g/dL (ref 31.5–35.7)
MCV: 74 fL — ABNORMAL LOW (ref 79–97)
Platelets: 92 10*3/uL — CL (ref 150–450)
RBC: 5.26 x10E6/uL (ref 3.77–5.28)
RDW: 14.5 % (ref 11.7–15.4)
WBC: 11.4 10*3/uL — ABNORMAL HIGH (ref 3.4–10.8)

## 2018-07-15 LAB — COMPREHENSIVE METABOLIC PANEL
ALT: 40 IU/L — ABNORMAL HIGH (ref 0–32)
AST: 47 IU/L — ABNORMAL HIGH (ref 0–40)
Albumin/Globulin Ratio: 1.4 (ref 1.2–2.2)
Albumin: 4 g/dL (ref 3.8–4.9)
Alkaline Phosphatase: 48 IU/L (ref 39–117)
BUN/Creatinine Ratio: 16 (ref 9–23)
BUN: 16 mg/dL (ref 6–24)
Bilirubin Total: 0.2 mg/dL (ref 0.0–1.2)
CO2: 23 mmol/L (ref 20–29)
Calcium: 10 mg/dL (ref 8.7–10.2)
Chloride: 98 mmol/L (ref 96–106)
Creatinine, Ser: 1 mg/dL (ref 0.57–1.00)
GFR calc Af Amer: 72 mL/min/{1.73_m2} (ref >59)
GFR calc non Af Amer: 62 mL/min/{1.73_m2} (ref >59)
Globulin, Total: 2.9 g/dL (ref 1.5–4.5)
Glucose: 179 mg/dL — ABNORMAL HIGH (ref 65–99)
Potassium: 4.1 mmol/L (ref 3.5–5.2)
Sodium: 139 mmol/L (ref 134–144)
Total Protein: 6.9 g/dL (ref 6.0–8.5)

## 2018-07-15 LAB — TSH: TSH: 0.976 u[IU]/mL (ref 0.450–4.500)

## 2018-07-15 NOTE — Telephone Encounter (Signed)
Pt returning Dr. Perley Jain call jw

## 2018-07-15 NOTE — Telephone Encounter (Signed)
Contacted pt to inform her of [place] appointment date/time.  It is scheduled for June 24th @ 9:00am with a 8:40am arrival time.  It will be done at [address]   Instructed her to bring insurance card/ID/mask, also not to bring anyone in with her. While on phone pt asked about lab results and I told her I would send message to PCP to have him give her the results. April Zimmerman Rumple, CMA

## 2018-07-15 NOTE — Telephone Encounter (Signed)
Pt is calling to leave the best number for Dr. Criss Rosales to return her call at [phone].

## 2018-07-15 NOTE — Assessment & Plan Note (Addendum)
Still w/ elevated LFTs and now with "puffiness", consistent thrombocytopenia, suggested biopsy from 2017 did not occur  Ordered elastography, liver.  Patient notified on phone personally.

## 2018-07-16 ENCOUNTER — Telehealth: Payer: Self-pay | Admitting: Family Medicine

## 2018-07-16 MED ORDER — METFORMIN HCL ER 500 MG PO TB24: 1000.0000 mg | ORAL_TABLET | Freq: Every day | ORAL | 0 refills | Status: DC

## 2018-07-16 NOTE — Telephone Encounter (Signed)
Notified patient of lab values that have resulted so far, increased metformin xr to 1000 daily  -Dr. Criss Rosales

## 2018-07-16 NOTE — Assessment & Plan Note (Signed)
Can't find read of last echo in the chart.  Will order echo given new complaint of worsening edema

## 2018-07-20 ENCOUNTER — Telehealth: Payer: Self-pay

## 2018-07-20 NOTE — Telephone Encounter (Signed)
LM for pt to call the office to confirm pt appt for [place] on 6/24 at 9 am. Nothing to eat or drink after midnight. Ottis Stain, CMA

## 2018-07-20 NOTE — Telephone Encounter (Signed)
-----   Message from Bobbye Riggs, [place] sent at 07/20/2018  9:11 AM EDT ----- Forward to white team ----- Message ----- From: [name], DO Sent: 07/15/2018  11:34 AM EDT To: [place] Team  Please call patient and tell her I ordered an [place] of her liver, (help schedule if that is something that needs done too)  -Thanks, [employer]

## 2018-07-21 ENCOUNTER — Other Ambulatory Visit: Payer: Self-pay | Admitting: Family Medicine

## 2018-07-21 DIAGNOSIS — I10 Essential (primary) hypertension: Secondary | ICD-10-CM

## 2018-07-23 ENCOUNTER — Other Ambulatory Visit: Payer: Self-pay | Admitting: Family Medicine

## 2018-07-23 DIAGNOSIS — I10 Essential (primary) hypertension: Secondary | ICD-10-CM

## 2018-07-24 ENCOUNTER — Ambulatory Visit (HOSPITAL_COMMUNITY)
Admission: RE | Admit: 2018-07-24 | Discharge: 2018-07-24 | Disposition: A | Discharge status: Home / Self Care | Payer: Medicare Other | Source: Ambulatory Visit | Attending: Family Medicine | Admitting: Family Medicine

## 2018-07-24 ENCOUNTER — Other Ambulatory Visit: Payer: Self-pay

## 2018-07-24 DIAGNOSIS — R601 Generalized edema: Secondary | ICD-10-CM | POA: Insufficient documentation

## 2018-07-24 MED ORDER — PERFLUTREN LIPID MICROSPHERE
1.0000 mL | INTRAVENOUS | Status: AC | PRN
  Administered 2018-07-24: 1 mL via INTRAVENOUS
  Filled 2018-07-24: qty 10

## 2018-07-24 NOTE — Progress Notes (Signed)
  Echocardiogram 2D Echocardiogram has been performed.  [name] 07/24/2018, 1:59 PM

## 2018-07-26 ENCOUNTER — Other Ambulatory Visit: Payer: Self-pay | Admitting: Family Medicine

## 2018-07-27 MED ORDER — CARVEDILOL 12.5 MG PO TABS: 12.5000 mg | ORAL_TABLET | Freq: Two times a day (BID) | ORAL | 0 refills | Status: DC

## 2018-07-27 NOTE — Addendum Note (Signed)
Addended by: Christen Bame D on: 07/27/2018 03:03 PM   Modules accepted: Orders

## 2018-07-27 NOTE — Telephone Encounter (Signed)
Transmission failed; resent.  Christen Bame, CMA

## 2018-07-29 ENCOUNTER — Ambulatory Visit
Admission: RE | Admit: 2018-07-29 | Discharge: 2018-07-29 | Disposition: A | Discharge status: Home / Self Care | Payer: Medicare Other | Source: Ambulatory Visit | Attending: Family Medicine | Admitting: Family Medicine

## 2018-07-29 DIAGNOSIS — R748 Abnormal levels of other serum enzymes: Secondary | ICD-10-CM

## 2018-07-29 DIAGNOSIS — K760 Fatty (change of) liver, not elsewhere classified: Secondary | ICD-10-CM | POA: Diagnosis not present

## 2018-07-30 ENCOUNTER — Telehealth: Payer: Self-pay | Admitting: Cardiology

## 2018-07-30 ENCOUNTER — Telehealth: Payer: Self-pay

## 2018-07-30 DIAGNOSIS — K769 Liver disease, unspecified: Secondary | ICD-10-CM

## 2018-07-30 NOTE — Telephone Encounter (Signed)
Dr.Hochrein, could you look at her ECHO and give results?  I know she will be new for you in July- but she is calling concerning her ECHO?  Thank you!

## 2018-07-30 NOTE — Telephone Encounter (Signed)
Elastography [place] of liver showing some fatty changes, two likely cysts and an indeterminate lesion 1.3 cm.  Will order f/u MRI.  -Dr. Criss Rosales

## 2018-07-30 NOTE — Telephone Encounter (Signed)
Patient called nurse line requesting results of [place]. Please advise.

## 2018-07-30 NOTE — Telephone Encounter (Signed)
Patient would like to discuss the results of her Echo done on 07/24/18

## 2018-07-31 NOTE — Telephone Encounter (Signed)
To white team to schedule both appts.  Christen Bame, CMA

## 2018-07-31 NOTE — Telephone Encounter (Signed)
lmovm for callback. [name], CMA

## 2018-07-31 NOTE — Telephone Encounter (Signed)
Called and LVM advising patient of message from MD. Patient left call back number if questions.

## 2018-07-31 NOTE — Telephone Encounter (Signed)
This result did not come from me and she should talk to the ordering MD.  However, I did look at it and there does not appear to be any significant abnormality.

## 2018-08-03 NOTE — Telephone Encounter (Addendum)
Getting ready to contact pt to schedule appointment and PCP does not have any openings on the 30th.  Routing to PCP to be sure it is ok to schedule on the 7th or see if he would like to double book on that day. Next day available is not unitl 08/11/2018.  PLease advise.  April Zimmerman Rumple, CMA

## 2018-08-03 NOTE — Telephone Encounter (Signed)
Contacted pt and scheduled her for tomorrow on ATC with BLand to go over results.  [name], CMA

## 2018-08-03 NOTE — Telephone Encounter (Signed)
Pt is returning [place] call and would like to leave a better call back number Cell phone: [phone]

## 2018-08-04 ENCOUNTER — Other Ambulatory Visit: Payer: Self-pay

## 2018-08-04 ENCOUNTER — Telehealth (INDEPENDENT_AMBULATORY_CARE_PROVIDER_SITE_OTHER): Payer: Medicare Other | Admitting: Family Medicine

## 2018-08-04 DIAGNOSIS — R748 Abnormal levels of other serum enzymes: Secondary | ICD-10-CM | POA: Diagnosis not present

## 2018-08-04 DIAGNOSIS — K769 Liver disease, unspecified: Secondary | ICD-10-CM | POA: Diagnosis not present

## 2018-08-04 NOTE — Telephone Encounter (Addendum)
Contacted [facility] and spoke to Coward and she said they will reach out to pt to contact her and get the MRI scheduled.April Zimmerman Rumple, CMA

## 2018-08-04 NOTE — Progress Notes (Signed)
Called patient to have telephone visit to discuss results of her prior work-up for potential liver disease and recent blood work along with echo.  She was reassured to hear that her echo was normal, he is nervous about undifferentiated finding on elastography ultrasound but understands the importance of follow-up MRI and agrees to do so.  No other questions.  Dr. Criss Rosales

## 2018-08-05 ENCOUNTER — Telehealth: Payer: Self-pay

## 2018-08-05 DIAGNOSIS — F40240 Claustrophobia: Secondary | ICD-10-CM

## 2018-08-05 MED ORDER — LORAZEPAM 0.5 MG PO TABS: 0.5000 mg | ORAL_TABLET | Freq: Once | ORAL | 0 refills | Status: AC

## 2018-08-05 NOTE — Telephone Encounter (Signed)
Patient calls nurse line stating she is scheduled for an MRI on 7/29, however she is claustrophobic and will need something prescribed to relax her for that day. Please advise.   [facility] [place]

## 2018-08-05 NOTE — Telephone Encounter (Signed)
Patient contacted and informed. Directions for use given.

## 2018-08-05 NOTE — Addendum Note (Signed)
Addended by: [name] R on: 08/05/2018 11:49 AM   Modules accepted: Orders

## 2018-08-11 ENCOUNTER — Other Ambulatory Visit: Payer: Self-pay | Admitting: Family Medicine

## 2018-08-11 DIAGNOSIS — K769 Liver disease, unspecified: Secondary | ICD-10-CM | POA: Insufficient documentation

## 2018-08-12 ENCOUNTER — Other Ambulatory Visit: Payer: Self-pay | Admitting: Family Medicine

## 2018-08-13 ENCOUNTER — Telehealth: Payer: Self-pay | Admitting: Family Medicine

## 2018-08-13 DIAGNOSIS — E11319 Type 2 diabetes mellitus with unspecified diabetic retinopathy without macular edema: Secondary | ICD-10-CM

## 2018-08-13 MED ORDER — ONETOUCH ULTRASOFT LANCETS MISC: 12 refills | Status: DC

## 2018-08-13 MED ORDER — GLUCOSE BLOOD VI STRP: ORAL_STRIP | 3 refills | Status: DC

## 2018-08-13 NOTE — Telephone Encounter (Signed)
Pt called for a refill for her Lancets, and glucose blood strips.Please give pt a call back.

## 2018-08-13 NOTE — Telephone Encounter (Signed)
Pt returned call. Pt has been informed that supplies have been sent in for refill. Ottis Stain, CMA

## 2018-08-13 NOTE — Telephone Encounter (Signed)
Called pt. Female answered the phone. Did not leave message as he asked if we can just call back. I will try to call again. Ottis Stain, CMA

## 2018-08-18 NOTE — Progress Notes (Signed)
Virtual Visit via Video Note   This visit type was conducted due to national recommendations for restrictions regarding the COVID-19 Pandemic (e.g. social distancing) in an effort to limit this patient's exposure and mitigate transmission in our community.  Due to her co-morbid illnesses, this patient is at least at moderate risk for complications without adequate follow up.  This format is felt to be most appropriate for this patient at this time.  All issues noted in this document were discussed and addressed.  A limited physical exam was performed with this format.  Please refer to the patient's chart for her consent to telehealth for [facility].   Date:  08/20/2018   ID:  [name], DOB 07/02/1960, MRN [id number]  Patient Location: Home Provider Location: Home  PCP:  [name], DO  Cardiologist:  Minus Breeding, MD  Electrophysiologist:  None   Evaluation Performed:  New Patient Evaluation  Chief Complaint:  Edema  History of Present Illness:    [name] is a 58 y.o. female who was last seen in 2018 for palpitations.  Her w/u in the past for her palpitations was benign. 2D echo in 2016 showed normal LVEF at 50-55%. She also had an exercise tolerance stress test that was normal.   She was last seen by [place] in 2018 for palpitations.  She had a normal echo in 2020.  This was done because she had some leg swelling.  She is not been having any new shortness of breath, PND or orthopnea.  She is not been having any palpitations, presyncope or syncope.  She has had some weight gain.  She does not watch her salt.  She is not really bothered by chest pressure, neck or arm discomfort.  She is not being very active during the pandemic.  Of note she has been on steroids.  This is been for some heel pain.   The patient does not have symptoms concerning for COVID-19 infection (fever, chills, cough, or new shortness of breath).    Past Medical History:  Diagnosis Date  .  Arthritis   . Breast CA ([place])    breast CA- right breast  . Diabetes mellitus   . Hyperlipidemia   . Hypertension   . Thrombocytopenia ([place])   . Thyroid disease    parathyroid removed   Past Surgical History:  Procedure Laterality Date  . ABDOMINAL HYSTERECTOMY     1996 per pt  . BREAST BIOPSY Right 06/2009  . BREAST SURGERY  09/12/10   excision of skin mass - right breast, had another surgery to remove more Cancer cells  . INNER EAR SURGERY  06/01/13  . MASTECTOMY Right 07/21/2009  . PARATHYROIDECTOMY     partial?  . REDUCTION MAMMAPLASTY Left 2013     Current Meds  Medication Sig  . acetaminophen (TYLENOL) 325 MG tablet Take 2 tablets (650 mg total) by mouth every 6 (six) hours as needed.  . Blood Glucose Monitoring Suppl (ONE TOUCH ULTRA 2) w/Device KIT USE   TO CHECK GLUCOSE UP TO THREE TIMES DAILY AS NEEDED  . carvedilol (COREG) 12.5 MG tablet Take 1 tablet (12.5 mg total) by mouth 2 (two) times daily with a meal.  . furosemide (LASIX) 20 MG tablet TAKE 1 TABLET BY MOUTH EVERY OTHER DAY  . glucose blood (ONE TOUCH ULTRA TEST) test strip Check blood sugar once daily  . hydrochlorothiazide (MICROZIDE) 12.5 MG capsule Take 1 capsule by mouth once daily  . Lancets (ONETOUCH ULTRASOFT) lancets Check  daily as needed  . lisinopril (PRINIVIL,ZESTRIL) 40 MG tablet Take 1 tablet (40 mg total) by mouth daily.  . metFORMIN (GLUCOPHAGE-XR) 500 MG 24 hr tablet Take 1,000 mg by mouth daily with breakfast.  . rosuvastatin (CRESTOR) 10 MG tablet Take 1 tablet (10 mg total) by mouth 3 (three) times a week.  . tamoxifen (NOLVADEX) 20 MG tablet Take 1 tablet (20 mg total) by mouth daily.  . traMADol (ULTRAM) 50 MG tablet Take 1 tablet by mouth twice daily as needed     Allergies:   Patient has no known allergies.   Social History   Tobacco Use  . Smoking status: Never Smoker  . Smokeless tobacco: Never Used  Substance Use Topics  . Alcohol use: No  . Drug use: No     Family Hx: The  patient's family history includes CAD (age of onset: 20) in her mother; Heart attack (age of onset: 6) in her brother; Stroke in her father. There is no history of Colon cancer, Esophageal cancer, Rectal cancer, or Stomach cancer.  ROS:   Please see the history of present illness.    As stated in the HPI and negative for all other systems.    Prior CV studies:   The following studies were reviewed today:  Echo  Labs/Other Tests and Data Reviewed:    EKG:  No ECG reviewed.  Recent Labs: 12/05/2017: BNP 7.4 07/14/2018: ALT 40; BUN 16; Creatinine, Ser 1.00; Hemoglobin 12.8; Platelets 92; Potassium 4.1; Sodium 139; TSH 0.976   Recent Lipid Panel Lab Results  Component Value Date/Time   CHOL 188 04/10/2018 10:25 AM   TRIG 157 (H) 04/10/2018 10:25 AM   HDL 76 04/10/2018 10:25 AM   CHOLHDL 2.5 04/10/2018 10:25 AM   CHOLHDL 4.0 02/11/2014 09:15 AM   LDLCALC 81 04/10/2018 10:25 AM   LDLDIRECT 124 (H) 04/24/2017 02:11 PM    Wt Readings from Last 3 Encounters:  08/20/18 207 lb (93.9 kg)  07/14/18 202 lb (91.6 kg)  04/10/18 198 lb (89.8 kg)     Objective:    Vital Signs:  BP 137/75   Pulse 80   Ht '5\' 4"'  (1.626 m)   Wt 207 lb (93.9 kg)   BMI 35.53 kg/m    VITAL SIGNS:  reviewed GEN:  no acute distress EYES:  sclerae anicteric, EOMI - Extraocular Movements Intact MUSCULOSKELETAL:  Mild edema NEURO:  alert and oriented x 3, no obvious focal deficit PSYCH:  normal affect  ASSESSMENT & PLAN:    Palpitations:   She is not bothered by this.  No change in therapy.   HTN:  Blood pressures well controlled.  She will continue her meds as listed.   DM:  A1c was slightly elevated and she will follow with her primary care physician  EDEMA:    She is can take an extra 20 mg of Lasix for the next 3 days.  We talked about reducing salt.  It should get better as she gets off her steroid.  She had a normal echo and I do not think further work-up is indicated.  COVID-19 Education:  The signs and symptoms of COVID-19 were discussed with the patient and how to seek care for testing (follow up with PCP or arrange E-visit).   The importance of social distancing was discussed today.  Time:   Today, I have spent 15 minutes with the patient with telehealth technology discussing the above problems.     Medication Adjustments/Labs and Tests Ordered: Current  medicines are reviewed at length with the patient today.  Concerns regarding medicines are outlined above.   Tests Ordered: No orders of the defined types were placed in this encounter.   Medication Changes: No orders of the defined types were placed in this encounter.   Follow Up:  As needed  Signed, Minus Breeding, MD  08/20/2018 8:28 AM    [place]

## 2018-08-19 ENCOUNTER — Telehealth: Payer: Self-pay | Admitting: Cardiology

## 2018-08-19 NOTE — Telephone Encounter (Signed)
Spoke with patient - informed patient to keep  virtual appointment- last appointment with Dr Percival Spanish was in 2017.  patient verbalized understanding and cell numbers obtained and instruction for virtual visit given.

## 2018-08-19 NOTE — Telephone Encounter (Signed)
New Message:    Pt says she does not need appt for tomorrow with Dr Percival Spanish.. She told me to check and make sure she did not need it.

## 2018-08-20 ENCOUNTER — Telehealth (INDEPENDENT_AMBULATORY_CARE_PROVIDER_SITE_OTHER): Payer: Medicare Other | Admitting: Cardiology

## 2018-08-20 ENCOUNTER — Encounter: Payer: Self-pay | Admitting: Cardiology

## 2018-08-20 DIAGNOSIS — I10 Essential (primary) hypertension: Secondary | ICD-10-CM | POA: Diagnosis not present

## 2018-08-20 MED ORDER — FUROSEMIDE 20 MG PO TABS: 20.0000 mg | ORAL_TABLET | ORAL | 3 refills | Status: DC

## 2018-08-20 NOTE — Patient Instructions (Signed)
Medication Instructions:  TAKE EXTRA FUROSEMIDE (LASIX) 20 MG DAILY FOR 3 DAYS THEN RESUME REGULAR DOSE  Lab work: NONE  Testing/Procedures: NONE  Follow-Up: AS NEEDED

## 2018-09-02 ENCOUNTER — Ambulatory Visit
Admission: RE | Admit: 2018-09-02 | Discharge: 2018-09-02 | Disposition: A | Discharge status: Home / Self Care | Payer: Medicare Other | Source: Ambulatory Visit | Attending: Family Medicine | Admitting: Family Medicine

## 2018-09-02 ENCOUNTER — Other Ambulatory Visit: Payer: Self-pay

## 2018-09-02 DIAGNOSIS — K760 Fatty (change of) liver, not elsewhere classified: Secondary | ICD-10-CM | POA: Diagnosis not present

## 2018-09-02 DIAGNOSIS — K7689 Other specified diseases of liver: Secondary | ICD-10-CM | POA: Diagnosis not present

## 2018-09-02 DIAGNOSIS — K769 Liver disease, unspecified: Secondary | ICD-10-CM

## 2018-09-02 MED ORDER — GADOBENATE DIMEGLUMINE 529 MG/ML IV SOLN
20.0000 mL | Freq: Once | INTRAVENOUS | Status: AC | PRN
  Administered 2018-09-02: 20 mL via INTRAVENOUS

## 2018-09-07 ENCOUNTER — Telehealth: Payer: Self-pay | Admitting: Family Medicine

## 2018-09-07 DIAGNOSIS — E11319 Type 2 diabetes mellitus with unspecified diabetic retinopathy without macular edema: Secondary | ICD-10-CM

## 2018-09-07 DIAGNOSIS — K760 Fatty (change of) liver, not elsewhere classified: Secondary | ICD-10-CM

## 2018-09-07 MED ORDER — ONETOUCH ULTRA 2 W/DEVICE KIT: PACK | 0 refills | Status: DC

## 2018-09-07 NOTE — Telephone Encounter (Signed)
Pt is calling to check on the status of her results from her liver test. Please call pt back when results are available.   The best call back number is [phone].

## 2018-09-07 NOTE — Telephone Encounter (Signed)
Recent liver imaging showed a few simple cysts that were not thought to be concerning.  Did show that she had hepatic steatosis or nonalcoholic fatty liver disease.  Attempted to call the patient to let them know that I was going referred him to gastroenterology to see if there was any more changes that need to be made to their management for this.   Referral placed, message left with a gentleman who picked up the phone to have Medicaid called Dr. Criss Rosales.

## 2018-09-07 NOTE — Telephone Encounter (Signed)
Pt returned Dr.Blands call regarding her results.Please give pt a call back.

## 2018-09-07 NOTE — Telephone Encounter (Signed)
I was able to connect the patient over the phone to tell her that her imaging did not show any cancer in her liver but it did show steatosis and that she has been referred to gastroenterology.  Patient understands.  She did say that her glucose meter was broken and is requested a refill which I have now placed Dr. Criss Rosales

## 2018-09-08 ENCOUNTER — Other Ambulatory Visit: Payer: Self-pay | Admitting: Family Medicine

## 2018-09-08 DIAGNOSIS — E11319 Type 2 diabetes mellitus with unspecified diabetic retinopathy without macular edema: Secondary | ICD-10-CM

## 2018-09-09 MED ORDER — ACCU-CHEK ACTIVE VI STRP: ORAL_STRIP | 12 refills | Status: DC

## 2018-09-09 MED ORDER — ONETOUCH DELICA LANCETS 33G MISC: 12 refills | Status: DC

## 2018-09-09 MED ORDER — ACCU-CHEK MULTICLIX LANCETS MISC: 12 refills | Status: DC

## 2018-09-09 MED ORDER — ONETOUCH VERIO W/DEVICE KIT: PACK | 0 refills | Status: DC

## 2018-09-09 MED ORDER — ACCU-CHEK AVIVA PLUS W/DEVICE KIT: PACK | 0 refills | Status: DC

## 2018-09-09 MED ORDER — ONETOUCH DELICA LANCING DEV MISC: 0 refills | Status: DC

## 2018-09-09 MED ORDER — ONETOUCH VERIO VI STRP: ORAL_STRIP | 12 refills | Status: DC

## 2018-09-09 NOTE — Telephone Encounter (Signed)
4 issues with last script sent in:  1. That meter is not covered by medicare  2. Non insulin dependent only covered for once a day testing  3. Medicare requires [employer] certified provider  4. Dx code needed on script for Medicare   Resent under [place] with necessary changes. Christen Bame, CMA

## 2018-09-09 NOTE — Telephone Encounter (Signed)
Switched as requested by pharm.

## 2018-09-09 NOTE — Addendum Note (Signed)
Addended by: Zenia Resides on: 09/09/2018 11:17 AM   Modules accepted: Orders

## 2018-09-09 NOTE — Addendum Note (Signed)
Addended by: Christen Bame D on: 09/09/2018 09:57 AM   Modules accepted: Orders

## 2018-09-23 ENCOUNTER — Encounter: Payer: Self-pay | Admitting: Family Medicine

## 2018-09-23 ENCOUNTER — Ambulatory Visit (INDEPENDENT_AMBULATORY_CARE_PROVIDER_SITE_OTHER): Payer: Medicare Other | Admitting: Family Medicine

## 2018-09-23 ENCOUNTER — Other Ambulatory Visit: Payer: Self-pay

## 2018-09-23 VITALS — BP 146/86 | HR 87 | Wt 194.4 lb

## 2018-09-23 DIAGNOSIS — R600 Localized edema: Secondary | ICD-10-CM

## 2018-09-23 DIAGNOSIS — R8290 Unspecified abnormal findings in urine: Secondary | ICD-10-CM

## 2018-09-23 DIAGNOSIS — K760 Fatty (change of) liver, not elsewhere classified: Secondary | ICD-10-CM | POA: Diagnosis not present

## 2018-09-23 LAB — POCT URINALYSIS DIP (MANUAL ENTRY)
Bilirubin, UA: NEGATIVE
Blood, UA: NEGATIVE
Glucose, UA: NEGATIVE mg/dL
Ketones, POC UA: NEGATIVE mg/dL
Leukocytes, UA: NEGATIVE
Nitrite, UA: NEGATIVE
Protein Ur, POC: NEGATIVE mg/dL
Spec Grav, UA: 1.01 (ref 1.010–1.025)
Urobilinogen, UA: 0.2 E.U./dL
pH, UA: 5.5 (ref 5.0–8.0)

## 2018-09-23 NOTE — Assessment & Plan Note (Addendum)
No dysuria, no discharge, no lesion or wounding, no new sexual partners.  Abnormal odor only during first urine of the morning.  UA ordered, no indication of infection requiring treatment.

## 2018-09-23 NOTE — Assessment & Plan Note (Signed)
Patient with nonalcoholic hepato-steatosis and likely noncancerous cysts on recent imaging.  Was referred to GI but had not heard from them.  April CMA found her referral information and get the patient so that she call and schedule appointment, we went over how important this is.

## 2018-09-23 NOTE — Progress Notes (Signed)
    Subjective:  [name] is a 58 y.o. female who presents to the [facility] today with a chief complaint of odor in urine on first urination of the day.   HPI: Abnormal urine odor No dysuria, no discharge, no lesion or wounding, no new sexual partners.  Abnormal odor only during first urine of the morning.  Pedal edema Improved after recent 3 to 4-day stretch of taking 40 furosemide over 20.  Current prescribed dose is 20 every other day.  Has not been doing daily weights, does say that she gets good urinary output with a dose of 20 furosemide consistently.  Nonalcoholic hepatosteatosis Patient with nonalcoholic hepato-steatosis and likely noncancerous cysts on recent imaging.  Was referred to GI but had not heard from them.  Objective:  Physical Exam: BP (!) 146/86   Pulse 87   Wt 194 lb 6.4 oz (88.2 kg)   SpO2 98%   BMI 33.37 kg/m   Gen: NAD, resting comfortably CV: RRR with no murmurs appreciated Pulm: NWOB, CTAB with no crackles, wheezes, or rhonchi GI: Normal bowel sounds present. Soft, Nontender, Nondistended. MSK: no edema, cyanosis, or clubbing noted Skin: warm, dry Neuro: grossly normal, moves all extremities Psych: Normal affect and thought content  Results for orders placed or performed in visit on 09/23/18 (from the past 72 hour(s))  POCT urinalysis dipstick     Status: None   Collection Time: 09/23/18 10:40 AM  Result Value Ref Range   Color, UA yellow yellow   Clarity, UA clear clear   Glucose, UA negative negative mg/dL   Bilirubin, UA negative negative   Ketones, POC UA negative negative mg/dL   Spec Grav, UA 1.010 1.010 - 1.025   Blood, UA negative negative   pH, UA 5.5 5.0 - 8.0   Protein Ur, POC negative negative mg/dL   Urobilinogen, UA 0.2 0.2 or 1.0 E.U./dL   Nitrite, UA Negative Negative   Leukocytes, UA Negative Negative     Assessment/Plan:  Abnormal urine odor No dysuria, no discharge, no lesion or wounding, no new sexual partners.   Abnormal odor only during first urine of the morning.  UA ordered, no indication of infection requiring treatment.  Pedal edema Improved after recent 3 to 4-day stretch of taking 40 furosemide over 20.  Current prescribed dose is 20 every other day.  We discussed starting daily weights to track this.  If she is 5 pounds over her home scale weight today August 18, she will start daily Lasix for 5 days, if this does not get her back to under 5 pounds over her dry weight she will call a physician  Nonalcoholic hepatosteatosis Patient with nonalcoholic hepato-steatosis and likely noncancerous cysts on recent imaging.  Was referred to GI but had not heard from them.  April CMA found her referral information and get the patient so that she call and schedule appointment, we went over how important this is.   [name], DO FAMILY MEDICINE RESIDENT - PGY3 09/24/2018 9:06 AM

## 2018-09-23 NOTE — Assessment & Plan Note (Signed)
Improved after recent 3 to 4-day stretch of taking 40 furosemide over 20.  Current prescribed dose is 20 every other day.  We discussed starting daily weights to track this.  If she is 5 pounds over her home scale weight today August 18, she will start daily Lasix for 5 days, if this does not get her back to under 5 pounds over her dry weight she will call a physician

## 2018-09-29 ENCOUNTER — Telehealth: Payer: Self-pay | Admitting: Family Medicine

## 2018-09-29 NOTE — Telephone Encounter (Signed)
Pt is calling to check on the status of her results from her urine test last week. Please call patient with results.

## 2018-09-30 NOTE — Telephone Encounter (Signed)
Pt informed. [name] [name], CMA  

## 2018-10-12 ENCOUNTER — Other Ambulatory Visit: Payer: Self-pay | Admitting: Family Medicine

## 2018-10-13 DIAGNOSIS — E119 Type 2 diabetes mellitus without complications: Secondary | ICD-10-CM | POA: Diagnosis not present

## 2018-10-13 DIAGNOSIS — H25813 Combined forms of age-related cataract, bilateral: Secondary | ICD-10-CM | POA: Diagnosis not present

## 2018-10-25 ENCOUNTER — Other Ambulatory Visit: Payer: Self-pay | Admitting: Family Medicine

## 2018-10-25 DIAGNOSIS — E785 Hyperlipidemia, unspecified: Secondary | ICD-10-CM

## 2018-10-28 ENCOUNTER — Encounter: Payer: Self-pay | Admitting: Internal Medicine

## 2018-10-28 ENCOUNTER — Ambulatory Visit (INDEPENDENT_AMBULATORY_CARE_PROVIDER_SITE_OTHER): Payer: Medicare Other | Admitting: Internal Medicine

## 2018-10-28 ENCOUNTER — Other Ambulatory Visit: Payer: Self-pay

## 2018-10-28 VITALS — BP 174/98 | HR 76 | Temp 98.4°F | Ht 64.0 in | Wt 199.4 lb

## 2018-10-28 DIAGNOSIS — D696 Thrombocytopenia, unspecified: Secondary | ICD-10-CM | POA: Diagnosis not present

## 2018-10-28 DIAGNOSIS — K760 Fatty (change of) liver, not elsewhere classified: Secondary | ICD-10-CM

## 2018-10-28 DIAGNOSIS — R945 Abnormal results of liver function studies: Secondary | ICD-10-CM

## 2018-10-28 DIAGNOSIS — R932 Abnormal findings on diagnostic imaging of liver and biliary tract: Secondary | ICD-10-CM | POA: Diagnosis not present

## 2018-10-28 DIAGNOSIS — R7989 Other specified abnormal findings of blood chemistry: Secondary | ICD-10-CM

## 2018-10-28 NOTE — Progress Notes (Signed)
HISTORY OF PRESENT ILLNESS:  [name] is a 58 y.o. female with a history of breast cancer and mild thrombocytopenia for which she is followed by oncology who is sent today by her primary care provider Dr. Criss Lawson regarding chronic elevation of hepatic transaminases, fatty liver, and abnormal liver imaging.  I saw the patient once in March 2014 when she underwent routine screening colonoscopy.  She was found to have right-sided diverticulosis and a diminutive colon polyp which was non-adenomatous.  Follow-up in 10 years recommended.  Reviewing the patient's blood work she has had mild elevation of hepatic transaminases typically less than 2 times the upper limit of normal.  Other liver tests have been normal.  Normal protein and albumin.  She is also had mild thrombocytopenia.  Last platelet count 92,000.  She has had work-up of her elevated liver tests over the years.  Specifically normal iron studies, negative hepatitis studies including hepatitis C, and negative ANA.  She has been chronically obese with a BMI in excess of 34.  Because of her elevated liver tests and low platelets she did undergo imaging assessment.  An abdominal ultrasound performed in January 2017 revealed fatty liver and hepatic cysts.  Subsequent ultrasound elastography of the liver performed July 29, 2018 revealed hepatic steatosis, indeterminant liver lesions for which follow-up imaging was recommended.  She was found to be at minimal risk for fibrosis with F0/F1 score.  An MRI was performed September 02, 2018.  She was found to have multiple simple and minimally complex hepatic cysts.  No aggressive hepatic lesions identified.  However, severe hepatic steatosis noted.  Patient denies a family history of liver disease.  REVIEW OF SYSTEMS:  All non-GI ROS negative unless otherwise stated in the HPI except for ankle swelling  Past Medical History:  Diagnosis Date  . Arthritis   . Breast CA ([place])    breast CA- right breast  .  Diabetes mellitus   . Hyperlipidemia   . Hypertension   . Thrombocytopenia ([place])   . Thyroid disease    parathyroid removed    Past Surgical History:  Procedure Laterality Date  . ABDOMINAL HYSTERECTOMY     1996 per pt  . BREAST BIOPSY Right 06/2009  . BREAST SURGERY  09/12/10   excision of skin mass - right breast, had another surgery to remove more Cancer cells  . INNER EAR SURGERY  06/01/13  . MASTECTOMY Right 07/21/2009  . PARATHYROIDECTOMY     partial?  . REDUCTION MAMMAPLASTY Left 2013    Social History [name]  reports that she has never smoked. She has never used smokeless tobacco. She reports that she does not drink alcohol or use drugs.  family history includes CAD (age of onset: 33) in her mother; Heart attack (age of onset: 68) in her brother; Stroke in her father.  No Known Allergies     PHYSICAL EXAMINATION: Vital signs: BP (!) 174/98   Pulse 76   Temp 98.4 F (36.9 C) (Oral)   Ht 5\' 4"  (1.626 m)   Wt 199 lb 6.4 oz (90.4 kg)   BMI 34.23 kg/m   Constitutional: Obese, generally well-appearing, no acute distress Psychiatric: alert and oriented x3, cooperative, pleasant Eyes: extraocular movements intact, anicteric, conjunctiva pink Mouth: oral pharynx moist, no lesions Neck: supple no lymphadenopathy Cardiovascular: heart regular rate and rhythm, no murmur Lungs: clear to auscultation bilaterally Abdomen: soft, obese, nontender, nondistended, no obvious ascites, no peritoneal signs, normal bowel sounds, no organomegaly Rectal: Omitted Extremities: no  clubbing or cyanosis.  1+ lower extremity edema bilaterally Skin: no lesions on visible extremities Neuro: No focal deficits. No asterixis.    ASSESSMENT:  1.  Chronically elevated hepatic transaminases secondary to fatty liver.  No evidence for cirrhosis at present 2.  Imaging studies abnormalities consistent with hepatic steatosis 3.  Benign-appearing cystic abnormalities of the liver.   Chronic 4.  Morbid obesity with BMI in excess of 34 5.  Personal history of breast cancer and chronic mild thrombocytopenia for which she is followed by oncology 6.  Screening colonoscopy 2014- negative for neoplasia.   PLAN:  1.  Discussed fatty liver.  Discussed both benign fatty liver and NASH with progression to cirrhosis and the risk of liver cancer associated with this process. 2.  Exercise and weight loss.  Discussed the importance of both with regards to fatty liver disease.  She understands and agrees 3.  Routine GI follow-up 1 year 4.  Screening colonoscopy 2024 A copy of this consultation note has been sent to Dr. Criss Lawson

## 2018-10-28 NOTE — Patient Instructions (Signed)
Continue to work on diet and exercise  Please follow up in one year

## 2018-11-04 ENCOUNTER — Other Ambulatory Visit: Payer: Self-pay | Admitting: Family Medicine

## 2018-11-10 ENCOUNTER — Ambulatory Visit: Payer: Medicare Other | Admitting: Family Medicine

## 2018-11-13 ENCOUNTER — Other Ambulatory Visit: Payer: Self-pay

## 2018-11-13 ENCOUNTER — Encounter: Payer: Self-pay | Admitting: Family Medicine

## 2018-11-13 ENCOUNTER — Ambulatory Visit (INDEPENDENT_AMBULATORY_CARE_PROVIDER_SITE_OTHER): Payer: Medicare Other | Admitting: Family Medicine

## 2018-11-13 VITALS — BP 144/78 | HR 84 | Ht 64.0 in | Wt 199.4 lb

## 2018-11-13 DIAGNOSIS — R8290 Unspecified abnormal findings in urine: Secondary | ICD-10-CM

## 2018-11-13 DIAGNOSIS — E11319 Type 2 diabetes mellitus with unspecified diabetic retinopathy without macular edema: Secondary | ICD-10-CM

## 2018-11-13 DIAGNOSIS — Z6834 Body mass index (BMI) 34.0-34.9, adult: Secondary | ICD-10-CM | POA: Diagnosis not present

## 2018-11-13 DIAGNOSIS — E6609 Other obesity due to excess calories: Secondary | ICD-10-CM | POA: Diagnosis not present

## 2018-11-13 DIAGNOSIS — Z23 Encounter for immunization: Secondary | ICD-10-CM | POA: Diagnosis not present

## 2018-11-13 DIAGNOSIS — K760 Fatty (change of) liver, not elsewhere classified: Secondary | ICD-10-CM | POA: Diagnosis not present

## 2018-11-13 LAB — POCT URINALYSIS DIP (MANUAL ENTRY)
Bilirubin, UA: NEGATIVE
Glucose, UA: NEGATIVE mg/dL
Ketones, POC UA: NEGATIVE mg/dL
Nitrite, UA: NEGATIVE
Protein Ur, POC: NEGATIVE mg/dL
Spec Grav, UA: 1.025 (ref 1.010–1.025)
Urobilinogen, UA: 0.2 E.U./dL
pH, UA: 5.5 (ref 5.0–8.0)

## 2018-11-13 LAB — POCT UA - MICROSCOPIC ONLY

## 2018-11-13 LAB — POCT GLYCOSYLATED HEMOGLOBIN (HGB A1C): HbA1c, POC (controlled diabetic range): 6.5 % (ref 0.0–7.0)

## 2018-11-13 NOTE — Assessment & Plan Note (Signed)
>>  ASSESSMENT AND PLAN FOR DM (DIABETES MELLITUS) WITH COMPLICATIONS (HCC) WRITTEN ON 11/13/2018  9:01 AM BY BLAND, SCOTT, DO  Well-controlled A1c of 6.5 down from prior 7.6.  Will not change medication plan.  Patient has started walking and making nutrition changes, we are referring to nutrition to help augment these goals.

## 2018-11-13 NOTE — Assessment & Plan Note (Signed)
Abnormal urine odor of unknown etiology: no dysuria, normal in the morning, no pelvic or abdominal pain, no bleeding or discharge complaints.  UA this visit and a month prior were both unremarkable, discussed with patient that this may just be changes in urine odor that do not require treatment but we will pull culture to try and verify.

## 2018-11-13 NOTE — Progress Notes (Signed)
Subjective:  [name] is a 58 y.o. female who presents to the [facility] today with a chief complaint of abnormal urine odor.   HPI: Abnormal urine odor Abnormal urine odor of unknown etiology: no dysuria, normal in the morning, no pelvic or abdominal pain, no bleeding or discharge complaints.  Need for immunization against influenza Patient consents to flu shot no symptoms  Obesity Making progress.  Patient has started walking daily and started to make some nutrition changes at home.  She recently met with GI who told her that weight control will be part of her treatment for nonalcoholic fatty liver disease.  Diabetes ([place]) Well-controlled A1c of 6.5 down from prior 7.6.  Will not change medication plan, patient has no trouble with metformin.  Patient has started walking and making nutrition changes,  Nonalcoholic hepatosteatosis No medication changes, patient will follow-up with GI in 1 year,   Objective:  Physical Exam: BP (!) 144/78   Pulse 84   Ht '5\' 4"'  (1.626 m)   Wt 199 lb 6.4 oz (90.4 kg)   SpO2 98%   BMI 34.23 kg/m   Gen: NAD, resting comfortably CV: RRR with no murmurs appreciated Pulm: NWOB, CTAB with no crackles, wheezes, or rhonchi GI: Normal bowel sounds present. Soft, Nontender, Nondistended. MSK: no edema, cyanosis, or clubbing noted Skin: warm, dry Neuro: grossly normal, moves all extremities Psych: Normal affect and thought content  Results for orders placed or performed in visit on 11/13/18 (from the past 72 hour(s))  POCT urinalysis dipstick     Status: Abnormal   Collection Time: 11/13/18  8:35 AM  Result Value Ref Range   Color, UA yellow yellow   Clarity, UA clear clear   Glucose, UA negative negative mg/dL   Bilirubin, UA negative negative   Ketones, POC UA negative negative mg/dL   Spec Grav, UA 1.025 1.010 - 1.025   Blood, UA small (A) negative   pH, UA 5.5 5.0 - 8.0   Protein Ur, POC negative negative mg/dL   Urobilinogen, UA 0.2 0.2  or 1.0 E.U./dL   Nitrite, UA Negative Negative   Leukocytes, UA Small (1+) (A) Negative  HgB A1c     Status: None   Collection Time: 11/13/18  8:36 AM  Result Value Ref Range   Hemoglobin A1C     HbA1c POC (<> result, manual entry)     HbA1c, POC (prediabetic range)     HbA1c, POC (controlled diabetic range) 6.5 0.0 - 7.0 %     Assessment/Plan:  Abnormal urine odor Abnormal urine odor of unknown etiology: no dysuria, normal in the morning, no pelvic or abdominal pain, no bleeding or discharge complaints.  UA this visit and a month prior were both unremarkable, discussed with patient that this may just be changes in urine odor that do not require treatment but we will pull culture to try and verify.  Need for immunization against influenza Patient consents to flu shot no symptoms  Obesity Making progress.  Patient has started walking daily and started to make some nutrition changes at home.  She recently met with GI who told her that weight control will be part of her treatment for nonalcoholic fatty liver disease.  Will augment current efforts by referring to nutrition  Diabetes ([place]) Well-controlled A1c of 6.5 down from prior 7.6.  Will not change medication plan.  Patient has started walking and making nutrition changes, we are referring to nutrition to help augment these goals.  Nonalcoholic hepatosteatosis  No medication changes, patient will follow-up with GI in 1 year, we will refer to nutrition to augment her goals of weight control which is the primary treatment at this time.   [name], DO FAMILY MEDICINE RESIDENT - PGY3 11/13/2018 9:01 AM

## 2018-11-13 NOTE — Assessment & Plan Note (Signed)
Making progress.  Patient has started walking daily and started to make some nutrition changes at home.  She recently met with GI who told her that weight control will be part of her treatment for nonalcoholic fatty liver disease.  Will augment current efforts by referring to nutrition

## 2018-11-13 NOTE — Assessment & Plan Note (Signed)
Patient consents to flu shot no symptoms

## 2018-11-13 NOTE — Assessment & Plan Note (Signed)
Well-controlled A1c of 6.5 down from prior 7.6.  Will not change medication plan.  Patient has started walking and making nutrition changes, we are referring to nutrition to help augment these goals.

## 2018-11-13 NOTE — Assessment & Plan Note (Signed)
No medication changes, patient will follow-up with GI in 1 year, we will refer to nutrition to augment her goals of weight control which is the primary treatment at this time.

## 2018-11-13 NOTE — Addendum Note (Signed)
Addended by: [place] Pink on: 11/13/2018 09:22 AM   Modules accepted: Orders

## 2018-11-23 NOTE — Assessment & Plan Note (Signed)
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 Started Tamoxifen 09/2010  Tamoxifen toxicities: 1. Hot flashes improved with Effexor 2. joint and muscle stiffness: I encouraged her to exercise and stay active. Plan is to continue tamoxifen for 10 years  Breast cancer surveillance: 1. Breast exam 10/21/2021is normal 2. Mammogram  6/8/2020is normal on the left breast category B breast density 3.  MRI abdomen: Done to evaluate lesions in the liver.  There are simple and minimally complex hepatic cysts.  Severe hepatic steatosis.  Chronic mild thrombocytopenia  Return to clinic in 1 year for follow-up

## 2018-11-24 NOTE — Progress Notes (Signed)
Patient Care Team: [name], DO as PCP - General (Family Medicine) Minus Breeding, MD as PCP - Cardiology (Cardiology)  DIAGNOSIS:    ICD-10-CM   1. Malignant neoplasm of central portion of right breast in female, estrogen receptor positive ([place])  C50.111    Z17.0     SUMMARY OF ONCOLOGIC HISTORY: Oncology History  Cancer of central portion of right female breast ([place])  07/21/2009 Surgery   Right breast mastectomy: 2 foci of invasive ductal carcinoma 2.2 cm, 0.5 cm, ER positive PR positive HER-2 negative Ki-67 11%, Oncotype score 27, 18% ROR   10/23/2009 - 12/25/2009 Chemotherapy   Adjuvant chemotherapy with Taxotere and Cytoxan every 3 weeks 4 cycles followed by reconstruction of the breast by Dr. Harlow Mares   02/13/2010 -  Anti-estrogen oral therapy   Tamoxifen 20 mg daily, plan for 10 years    02/19/2010 - 03/28/2010 Radiation Therapy   Adjuvant radiation therapy   09/12/2010 Surgery   Recurrence of invasive ductal carcinoma of the right mastectomy scar is a nodule, invasive ductal carcinoma margins positive, ER 96%, PR 75%, HER-2 negative   09/17/2013 Surgery   Left breast reduction surgery     CHIEF COMPLIANT: Follow-up of recurrent right breast cancer on tamoxifen therapy  INTERVAL HISTORY: [name] is a 58 y.o. with above-mentioned history of recurrent right breast cancer treated with right mastectomy, adjuvant chemotherapy, radiation, and who is currently on tamoxifen. I last saw her a year ago. Mammogram of the left breast on 07/13/18 showed no evidence of malignancy. She presents to the clinic today for follow-up.  She is complaining of aches and pains in the body.  Otherwise tolerating tamoxifen very well.  REVIEW OF SYSTEMS:   Constitutional: Denies fevers, chills or abnormal weight loss Eyes: Denies blurriness of vision Ears, nose, mouth, throat, and face: Denies mucositis or sore throat Respiratory: Denies cough, dyspnea or wheezes Cardiovascular: Denies  palpitation, chest discomfort Gastrointestinal: Denies nausea, heartburn or change in bowel habits Skin: Denies abnormal skin rashes Lymphatics: Denies new lymphadenopathy or easy bruising Neurological: Denies numbness, tingling or new weaknesses Behavioral/Psych: Mood is stable, no new changes  Extremities: No lower extremity edema Breast: Right mastectomy All other systems were reviewed with the patient and are negative.  I have reviewed the past medical history, past surgical history, social history and family history with the patient and they are unchanged from previous note.  ALLERGIES:  has No Known Allergies.  MEDICATIONS:  Current Outpatient Medications  Medication Sig Dispense Refill  . acetaminophen (TYLENOL) 325 MG tablet Take 2 tablets (650 mg total) by mouth every 6 (six) hours as needed. 30 tablet 0  . Blood Glucose Monitoring Suppl (ACCU-CHEK AVIVA PLUS) w/Device KIT Use to test blood sugar daily 1 kit 0  . carvedilol (COREG) 12.5 MG tablet Take 1 tablet (12.5 mg total) by mouth 2 (two) times daily with a meal. 180 tablet 0  . furosemide (LASIX) 20 MG tablet Take 1 tablet (20 mg total) by mouth every other day. FURTHER REFILLS FROM PRIMARY CARE DOCTOR 15 tablet 3  . glucose blood (ACCU-CHEK ACTIVE STRIPS) test strip Use to test blood sugar daily 100 each 12  . hydrochlorothiazide (MICROZIDE) 12.5 MG capsule Take 1 capsule by mouth once daily 90 capsule 0  . Lancet Devices (ONE TOUCH DELICA LANCING DEV) MISC Use to test blood sugar once daily.  Dx Code: E11.9 1 each 0  . Lancets (ACCU-CHEK MULTICLIX) lancets Use to test blood sugar daily 100 each 12  .  lisinopril (PRINIVIL,ZESTRIL) 40 MG tablet Take 1 tablet (40 mg total) by mouth daily. 90 tablet 3  . metFORMIN (GLUCOPHAGE-XR) 500 MG 24 hr tablet Take 1 tablet by mouth once daily with breakfast 90 tablet 0  . rosuvastatin (CRESTOR) 10 MG tablet TAKE 1 TABLET BY MOUTH THREE TIMES A WEEK 18 tablet 0  . tamoxifen (NOLVADEX) 20  MG tablet Take 1 tablet (20 mg total) by mouth daily. 90 tablet 3  . traMADol (ULTRAM) 50 MG tablet Take 1 tablet by mouth twice daily as needed 60 tablet 0   No current facility-administered medications for this visit.     PHYSICAL EXAMINATION: ECOG PERFORMANCE STATUS: 1 - Symptomatic but completely ambulatory  Vitals:   11/25/18 0814  BP: (!) 149/81  Pulse: (!) 102  Resp: 18  Temp: 98.9 F (37.2 C)  SpO2: 100%   Filed Weights   11/25/18 0814  Weight: 193 lb 9.6 oz (87.8 kg)    GENERAL: alert, no distress and comfortable SKIN: skin color, texture, turgor are normal, no rashes or significant lesions EYES: normal, Conjunctiva are pink and non-injected, sclera clear OROPHARYNX: no exudate, no erythema and lips, buccal mucosa, and tongue normal  NECK: supple, thyroid normal size, non-tender, without nodularity LYMPH: no palpable lymphadenopathy in the cervical, axillary or inguinal LUNGS: clear to auscultation and percussion with normal breathing effort HEART: regular rate & rhythm and no murmurs and no lower extremity edema ABDOMEN: abdomen soft, non-tender and normal bowel sounds MUSCULOSKELETAL: no cyanosis of digits and no clubbing  NEURO: alert & oriented x 3 with fluent speech, no focal motor/sensory deficits EXTREMITIES: No lower extremity edema BREAST: No palpable lumps or nodules of concern. (exam performed in the presence of a chaperone)  LABORATORY DATA:  I have reviewed the data as listed CMP Latest Ref Rng & Units 07/14/2018 04/22/2018 12/12/2017  Glucose 65 - 99 mg/dL 179(H) 105(H) 74  BUN 6 - 24 mg/dL _0 Creatinine 0.57 - 1.00 mg/dL 1.00 0.99 0.89  Sodium 134 - 144 mmol/L 139 140 141  Potassium 3.5 - 5.2 mmol/L 4.1 4.6 4.3  Chloride 96 - 106 mmol/L 98 100 99  CO2 20 - 29 mmol/L _1 Calcium 8.7 - 10.2 mg/dL 10.0 10.0 9.6  Total Protein 6.0 - 8.5 g/dL 6.9 - -  Total Bilirubin 0.0 - 1.2 mg/dL 0.2 - -  Alkaline Phos 39 - 117 IU/L 48 - -  AST 0 - 40  IU/L 47(H) - -  ALT 0 - 32 IU/L 40(H) - -    Lab Results  Component Value Date   WBC 11.4 (H) 07/14/2018   HGB 12.8 07/14/2018   HCT 38.8 07/14/2018   MCV 74 (L) 07/14/2018   PLT 92 (LL) 07/14/2018   NEUTROABS 3.3 11/24/2017    ASSESSMENT & PLAN:  Cancer of central portion of right female breast ([place]) Right breast invasive ductal carcinoma multifocal disease 2.2 cm and 0.5 cm ER/PR positive HER-2 negative with Ki-67 11%, Oncotype DX score 27, 18% risk of recurrence, adjuvant chemotherapy with TC 4 completed 12/25/2009 followed by breast reconstruction, radiation and tamoxifen started January 2012; chest wall recurrence August 2012 resected with positive margins and Radiated. Started Tamoxifen 09/2010  Tamoxifen toxicities: 1. Hot flashes improved with Effexor 2. joint and muscle stiffness: I encouraged her to exercise and stay active. Plan is to continue tamoxifen for 10 years  Breast cancer surveillance: 1. Breast exam 10/21/2021is normal 2. Mammogram  6/8/2020is normal on the  left breast category B breast density 3.  MRI abdomen: Done to evaluate lesions in the liver.  There are simple and minimally complex hepatic cysts.  Severe hepatic steatosis.  Chronic mild thrombocytopenia: June 2020 platelet counts were in the 90s Microcytosis: Patient probably has thalassemia.  Return to clinic in 1 year for follow-up    No orders of the defined types were placed in this encounter.  The patient has a good understanding of the overall plan. she agrees with it. she will call with any problems that may develop before the next visit here.  Danielle Lose, MD 11/25/2018  [name] am acting as scribe for Dr. Nicholas Lawson.  I have reviewed the above documentation for accuracy and completeness, and I agree with the above.

## 2018-11-25 ENCOUNTER — Other Ambulatory Visit: Payer: Self-pay

## 2018-11-25 ENCOUNTER — Inpatient Hospital Stay: Payer: Medicare Other | Attending: Hematology and Oncology | Admitting: Hematology and Oncology

## 2018-11-25 DIAGNOSIS — Z79899 Other long term (current) drug therapy: Secondary | ICD-10-CM | POA: Diagnosis not present

## 2018-11-25 DIAGNOSIS — Z7984 Long term (current) use of oral hypoglycemic drugs: Secondary | ICD-10-CM | POA: Diagnosis not present

## 2018-11-25 DIAGNOSIS — Z923 Personal history of irradiation: Secondary | ICD-10-CM | POA: Insufficient documentation

## 2018-11-25 DIAGNOSIS — Z170 Estrogen receptor positive status [ER+]: Secondary | ICD-10-CM | POA: Insufficient documentation

## 2018-11-25 DIAGNOSIS — R718 Other abnormality of red blood cells: Secondary | ICD-10-CM | POA: Insufficient documentation

## 2018-11-25 DIAGNOSIS — Z79810 Long term (current) use of selective estrogen receptor modulators (SERMs): Secondary | ICD-10-CM | POA: Diagnosis not present

## 2018-11-25 DIAGNOSIS — N951 Menopausal and female climacteric states: Secondary | ICD-10-CM | POA: Insufficient documentation

## 2018-11-25 DIAGNOSIS — C50111 Malignant neoplasm of central portion of right female breast: Secondary | ICD-10-CM | POA: Insufficient documentation

## 2018-11-25 DIAGNOSIS — Z9011 Acquired absence of right breast and nipple: Secondary | ICD-10-CM | POA: Insufficient documentation

## 2018-11-25 MED ORDER — TAMOXIFEN CITRATE 20 MG PO TABS: 20.0000 mg | ORAL_TABLET | Freq: Every day | ORAL | 3 refills | Status: DC

## 2018-11-26 ENCOUNTER — Telehealth: Payer: Self-pay | Admitting: Hematology and Oncology

## 2018-11-26 NOTE — Telephone Encounter (Signed)
I could not reach patient regarding schedule, mailbox was full.

## 2018-12-14 ENCOUNTER — Telehealth: Payer: Self-pay

## 2018-12-14 DIAGNOSIS — E11319 Type 2 diabetes mellitus with unspecified diabetic retinopathy without macular edema: Secondary | ICD-10-CM

## 2018-12-14 MED ORDER — ACCU-CHEK SOFTCLIX LANCETS MISC: 1.0000 | Freq: Three times a day (TID) | 12 refills | Status: DC

## 2018-12-14 NOTE — Telephone Encounter (Signed)
Received fax from [facility]. Pt would like to change lancets to the Softclix. Please send Rx to [place]. Ottis Stain, CMA

## 2018-12-15 MED ORDER — LANCETS MISC. MISC: 12 refills | Status: DC

## 2018-12-15 NOTE — Addendum Note (Signed)
Addended byDorcas Mcmurray L on: 12/15/2018 04:31 PM   Modules accepted: Orders

## 2018-12-15 NOTE — Telephone Encounter (Signed)
Dr Criss Rosales is not enrolled on Medicare. Pt would like softclix lancets. Use 3 times daily. Ottis Stain, CMA

## 2018-12-21 ENCOUNTER — Telehealth: Payer: Self-pay | Admitting: Family Medicine

## 2018-12-21 ENCOUNTER — Encounter: Payer: Self-pay | Admitting: Family Medicine

## 2018-12-21 NOTE — Telephone Encounter (Signed)
Patient would like to know if it is time for her to have her kidneys checked out, at [phone].

## 2019-01-01 ENCOUNTER — Other Ambulatory Visit: Payer: Self-pay | Admitting: Family Medicine

## 2019-01-04 ENCOUNTER — Other Ambulatory Visit: Payer: Self-pay | Admitting: Cardiology

## 2019-01-04 DIAGNOSIS — I10 Essential (primary) hypertension: Secondary | ICD-10-CM

## 2019-01-05 ENCOUNTER — Other Ambulatory Visit: Payer: Self-pay

## 2019-01-05 ENCOUNTER — Ambulatory Visit: Payer: Medicare Other | Admitting: Registered"

## 2019-01-05 DIAGNOSIS — I10 Essential (primary) hypertension: Secondary | ICD-10-CM

## 2019-01-05 MED ORDER — FUROSEMIDE 20 MG PO TABS: 20.0000 mg | ORAL_TABLET | ORAL | 3 refills | Status: DC

## 2019-01-21 ENCOUNTER — Other Ambulatory Visit: Payer: Self-pay | Admitting: Family Medicine

## 2019-01-26 ENCOUNTER — Other Ambulatory Visit: Payer: Self-pay | Admitting: Family Medicine

## 2019-01-26 DIAGNOSIS — E785 Hyperlipidemia, unspecified: Secondary | ICD-10-CM

## 2019-03-01 ENCOUNTER — Telehealth: Payer: Self-pay | Admitting: *Deleted

## 2019-03-01 NOTE — Telephone Encounter (Signed)
Received fax stating that the softclix were supplied to pt and it exceeds the medicare utilization parameters.  The Rx is for 100.  The guideline is 1x/day for non-insulin, and 3x/day for insulin treated. Placed form in box for reference if needed. They are looking for documentation showing that pt needs these more that the guideline amount. Elliotte Marsalis Zimmerman Rumple, CMA

## 2019-03-03 ENCOUNTER — Ambulatory Visit: Payer: Medicare Other | Admitting: Family Medicine

## 2019-03-04 ENCOUNTER — Other Ambulatory Visit: Payer: Self-pay | Admitting: Family Medicine

## 2019-03-04 DIAGNOSIS — E11319 Type 2 diabetes mellitus with unspecified diabetic retinopathy without macular edema: Secondary | ICD-10-CM

## 2019-03-04 MED ORDER — ACCU-CHEK SOFTCLIX LANCETS MISC: 1.0000 | Freq: Every day | 12 refills | Status: AC

## 2019-03-04 NOTE — Progress Notes (Signed)
Received notice that patient's insurance did not cover soft clicks which appeared to be ordered for multiple times per day and patient is not on insulin.  Change to daily  Dr. Criss Rosales

## 2019-03-10 ENCOUNTER — Telehealth: Payer: Self-pay | Admitting: Family Medicine

## 2019-03-10 NOTE — Telephone Encounter (Signed)
Patient came into office to drop off placard form to be completed by PCP, pt's last apt was on 11-13-18, and forms were placed in white team folder.

## 2019-03-11 NOTE — Telephone Encounter (Signed)
Clinical info completed on Disability Parking form.  Place form in Dr. Fransico Setters box for completion.  [name], CMA

## 2019-03-16 NOTE — Telephone Encounter (Signed)
Per Dr. Criss Rosales, patient needs to schedule office visit before forms can be completed. Attempted to call patient, no answer, and voicemail full. Will attempt to call later.   Talbot Grumbling, RN

## 2019-03-17 ENCOUNTER — Other Ambulatory Visit: Payer: Self-pay | Admitting: Hematology and Oncology

## 2019-03-17 DIAGNOSIS — Z1231 Encounter for screening mammogram for malignant neoplasm of breast: Secondary | ICD-10-CM

## 2019-03-29 ENCOUNTER — Telehealth: Payer: Self-pay | Admitting: *Deleted

## 2019-03-29 NOTE — Telephone Encounter (Signed)
LVM to call office to go over screening questions prior to visit.[name], CMA  

## 2019-03-30 ENCOUNTER — Ambulatory Visit (INDEPENDENT_AMBULATORY_CARE_PROVIDER_SITE_OTHER): Payer: Medicare Other | Admitting: Family Medicine

## 2019-03-30 ENCOUNTER — Encounter: Payer: Self-pay | Admitting: Family Medicine

## 2019-03-30 ENCOUNTER — Other Ambulatory Visit: Payer: Self-pay

## 2019-03-30 VITALS — BP 172/98 | HR 87 | Wt 193.2 lb

## 2019-03-30 DIAGNOSIS — Z0271 Encounter for disability determination: Secondary | ICD-10-CM | POA: Diagnosis not present

## 2019-03-30 DIAGNOSIS — I10 Essential (primary) hypertension: Secondary | ICD-10-CM | POA: Diagnosis not present

## 2019-03-30 DIAGNOSIS — R300 Dysuria: Secondary | ICD-10-CM

## 2019-03-30 LAB — POCT UA - MICROSCOPIC ONLY

## 2019-03-30 NOTE — Progress Notes (Signed)
    SUBJECTIVE:   CHIEF COMPLAINT / HPI:   Disability parking pass request and urinary odor  Dysuria Intermittent dysuria and urinary odor over the past few weeks.  No known trigger or alleviating factor.  No suprapubic pain or fever, no change in urinary frequency or complaints of discharge or lesions.  No trauma reported.  Disability examination Patient requesting disability parking pass.  While I do believe her report that there are days that she is able to walk better than others, I feel that I do not have the ability to accurately report that I think she is medically unable to walk the 200 feet which is the metric required for signing a disability parking pass.  She has been attempting to use walking as her primary exercise which does not mean that she does not have more pain on some days than others, she does have normal degenerative changes confirmed on imaging   HTN: we discussed that she checks her BP at home and it is consistently 130s/70s  PERTINENT  PMH / PSH:   OBJECTIVE:   BP (!) 172/98   Pulse 87   Wt 193 lb 3.2 oz (87.6 kg)   SpO2 99%   BMI 33.16 kg/m   Gen: alert, no distress Resp: no wob/no cough, no stridor Card: regular rate MSK: normal gait, ambulating w/o assistive device  ASSESSMENT/PLAN:   Dysuria Intermittent dysuria and urinary odor over the past few weeks.  No known trigger or alleviating factor.  No suprapubic pain or fever, no change in urinary frequency or complaints of discharge or lesions.  No trauma reported.  UA unremarkable and urine culture is negative, no indication for antibiotics at this time.  If complaints continue we can consider referral to urology  Disability examination Patient requesting disability parking pass.  While I do believe her report that there are days that she is able to walk better than others, I feel that I do not have the ability to accurately report that I think she is medically unable to walk the 200 feet which is  the metric required for signing a disability parking pass.  She has been attempting to use walking as her primary exercise which does not mean that she does not have more pain on some days than others, she does have normal degenerative changes confirmed on imaging but I do not feel that I can honestly sign a disability parking pass at this time.  It may be possible that another physician will feel otherwise and if she requests a referral to occupational medicine for a disability review I would place that referral for her.  Essential hypertension we discussed that she checks her BP at home and it is consistently 130s/70s  Will not increase meds based on clinic reading as home readings are within reasonable range     [place]

## 2019-03-30 NOTE — Patient Instructions (Signed)
It was a pleasure to see you today! Thank you for choosing [facility] for your primary care. [name] was seen for recurring urine odor and dysuria/blood pressure/and parking pass.. Come back to the clinic if you notice any increase in your home blood pressure readings.  Today we ordered a urine culture to investigate your urine complaints, if you find that there is infection then we can order an antibiotic which should help the symptoms from coming back because he said they are repeating.  While your blood pressure was slightly high in the office, you are reporting your home blood pressures in the 130s over 70s.  Those are appropriate ranges and we are going to use those to set your medications so will not be adding anything at this time.  Please let us know if you find your home blood pressure readings start increasing  I am going to review your medical records further in relation to your request for parking pass, I will get an answer to you this week.   Please bring all your medications to every doctors visit   Sign up for My Chart to have easy access to your labs results, and communication with your Primary care physician.     Please check-out at the front desk before leaving the clinic.     Best,  Dr. [name] FAMILY MEDICINE RESIDENT - PGY3 03/30/2019 2:55 PM

## 2019-03-31 ENCOUNTER — Other Ambulatory Visit: Payer: Self-pay | Admitting: Family Medicine

## 2019-03-31 DIAGNOSIS — E785 Hyperlipidemia, unspecified: Secondary | ICD-10-CM

## 2019-04-01 ENCOUNTER — Encounter: Payer: Self-pay | Admitting: Family Medicine

## 2019-04-01 DIAGNOSIS — R300 Dysuria: Secondary | ICD-10-CM | POA: Insufficient documentation

## 2019-04-01 DIAGNOSIS — Z0271 Encounter for disability determination: Secondary | ICD-10-CM | POA: Insufficient documentation

## 2019-04-01 LAB — URINE CULTURE

## 2019-04-01 NOTE — Assessment & Plan Note (Signed)
Intermittent dysuria and urinary odor over the past few weeks.  No known trigger or alleviating factor.  No suprapubic pain or fever, no change in urinary frequency or complaints of discharge or lesions.  No trauma reported.  UA unremarkable and urine culture is negative, no indication for antibiotics at this time.  If complaints continue we can consider referral to urology

## 2019-04-01 NOTE — Assessment & Plan Note (Addendum)
Patient requesting disability parking pass.  While I do believe her report that there are days that she is able to walk better than others, I feel that I do not have the ability to accurately report that I think she is medically unable to walk the 200 feet which is the metric required for signing a disability parking pass.  She has been attempting to use walking as her primary exercise which does not mean that she does not have more pain on some days than others, she does have normal degenerative changes confirmed on imaging but I do not feel that I can honestly sign a disability parking pass at this time.  It may be possible that another physician will feel otherwise and if she requests a referral to occupational medicine for a disability review I would place that referral for her.

## 2019-04-01 NOTE — Assessment & Plan Note (Signed)
we discussed that she checks her BP at home and it is consistently 130s/70s  Will not increase meds based on clinic reading as home readings are within reasonable range

## 2019-04-06 NOTE — Telephone Encounter (Signed)
Patient calling for her Handicap Placard form. Patient says she needs this form back even if Dr. Criss Rosales does not complete it. Please call patient back at [phone] to discuss this.

## 2019-04-08 ENCOUNTER — Telehealth: Payer: Self-pay | Admitting: Family Medicine

## 2019-04-08 ENCOUNTER — Telehealth: Payer: Self-pay | Admitting: *Deleted

## 2019-04-08 NOTE — Telephone Encounter (Signed)
Called pt to inform her of below and she was not happy, she said he did not do what he said he would do.  When I asked about the referral that was mentioned in the My Chart message she said that she did not get that because she does not read them. She said "you tell him to call me".  Informed pt that I would send a message to him. [name], CMA [name], DO  [facility] 2 days ago   Form is in the box at the front desk, if they would like to me to place the referral to the other doctor for a second opinion I can do that   Message text

## 2019-04-08 NOTE — Telephone Encounter (Signed)
This is being handled in another encounter. Will close this one. Carlee Tesfaye Zimmerman Rumple, CMA

## 2019-04-08 NOTE — Telephone Encounter (Signed)
Called patient, female picked up phone and says she wasn't home.  I identified myself and asked them to relay that I had called.  -Dr. Criss Rosales

## 2019-04-08 NOTE — Telephone Encounter (Signed)
Pt called inquiring about handicap sticker form.  Request to be called at [phone]

## 2019-04-12 ENCOUNTER — Telehealth: Payer: Self-pay | Admitting: Family Medicine

## 2019-04-12 NOTE — Telephone Encounter (Signed)
Pt is calling and would like for someone to call her with the results from her last urine test.

## 2019-04-13 NOTE — Telephone Encounter (Signed)
Patient calls regarding the information below. Read off Dr. Perley Jain notes and patients voiced understanding and appreciated the information.

## 2019-04-13 NOTE — Telephone Encounter (Signed)
Contacted pt and informed her of below and she said that she does still have an odor with her urine sometimes and that she takes lasix and wanted to know if maybe it is time to have her kidneys checked.Rosali Augello Zimmerman Rumple, CMA

## 2019-04-13 NOTE — Telephone Encounter (Signed)
LVM to call office to inform pt of below.Rashae Rother Zimmerman Rumple, CMA  

## 2019-04-13 NOTE — Telephone Encounter (Signed)
She had kidney function checked ~9 months ago and it was fine, normally we go a year because that's all insurance will pay for.     -Dr. Criss Rosales

## 2019-04-21 ENCOUNTER — Other Ambulatory Visit: Payer: Self-pay | Admitting: Family Medicine

## 2019-04-21 DIAGNOSIS — I10 Essential (primary) hypertension: Secondary | ICD-10-CM

## 2019-04-26 ENCOUNTER — Other Ambulatory Visit: Payer: Self-pay | Admitting: Family Medicine

## 2019-05-03 MED ORDER — METFORMIN HCL ER 500 MG PO TB24: ORAL_TABLET | ORAL | 0 refills | Status: DC

## 2019-05-03 NOTE — Telephone Encounter (Signed)
1st transmission failed.  Resent . Shiesha Jahn, CMA ° °

## 2019-05-03 NOTE — Addendum Note (Signed)
Addended by: Christen Bame D on: 05/03/2019 10:20 AM   Modules accepted: Orders

## 2019-05-15 ENCOUNTER — Other Ambulatory Visit: Payer: Self-pay | Admitting: Family Medicine

## 2019-05-29 ENCOUNTER — Other Ambulatory Visit: Payer: Self-pay | Admitting: Family Medicine

## 2019-05-29 DIAGNOSIS — I10 Essential (primary) hypertension: Secondary | ICD-10-CM

## 2019-05-31 ENCOUNTER — Telehealth: Payer: Self-pay

## 2019-05-31 NOTE — Telephone Encounter (Signed)
Called patient to do their pre-visit COVID screening.  States that she will not be coming to appointment. Has found a different provider.

## 2019-06-01 ENCOUNTER — Ambulatory Visit: Payer: Medicare Other | Admitting: Internal Medicine

## 2019-06-02 ENCOUNTER — Other Ambulatory Visit: Payer: Self-pay | Admitting: Family Medicine

## 2019-06-02 DIAGNOSIS — E785 Hyperlipidemia, unspecified: Secondary | ICD-10-CM

## 2019-06-24 ENCOUNTER — Ambulatory Visit
Admission: EM | Admit: 2019-06-24 | Discharge: 2019-06-24 | Disposition: A | Discharge status: Home / Self Care | Payer: Medicare Other | Attending: Physician Assistant | Admitting: Physician Assistant

## 2019-06-24 DIAGNOSIS — L739 Follicular disorder, unspecified: Secondary | ICD-10-CM

## 2019-06-24 MED ORDER — MUPIROCIN 2 % EX OINT: 1.0000 "application " | TOPICAL_OINTMENT | Freq: Two times a day (BID) | CUTANEOUS | 0 refills | Status: DC

## 2019-06-24 MED ORDER — CEPHALEXIN 500 MG PO CAPS
500.0000 mg | ORAL_CAPSULE | Freq: Four times a day (QID) | ORAL | 0 refills | Status: DC
Start: 2019-06-24 — End: 2019-11-10

## 2019-06-24 NOTE — ED Provider Notes (Signed)
[facility]    CSN: [id number] Arrival date & time: 06/24/19  1044      History   Chief Complaint Chief Complaint  Patient presents with  . vaginal irritation    HPI [name] is a 59 y.o. female.   59 year old female with history of breast cancer s/p mastectomy, radiation and chemotherapy, currently on tamoxifen, DM, HLD, HTN, ITP comes in for 2 day history of vaginal swelling after shaving. Denies new products used. Razor relatively new. Denies itching, pain, erythema, warmth, drainage. Denies fever, chills, body aches. Denies vaginal discharge. Denies urinary symptoms.  Last a1c 6.1 per patient     Past Medical History:  Diagnosis Date  . Arthritis   . Breast CA ([place])    breast CA- right breast  . Diabetes mellitus   . Hyperlipidemia   . Hypertension   . Thrombocytopenia ([place])   . Thyroid disease    parathyroid removed    Patient Active Problem List   Diagnosis Date Noted  . Dysuria 04/01/2019  . Disability examination 04/01/2019  . Nonalcoholic hepatosteatosis 10/93/2355  . Abnormal urine odor 09/23/2018  . Hepatic lesion 08/11/2018  . Generalized edema 07/14/2018  . Need for immunization against influenza 12/04/2017  . Arthritis of both hands 04/03/2016  . Obesity 08/30/2015  . 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 Cataracts, bilateral 09/13/2014  . Glaucoma 09/13/2014  . Mitral regurgitation 02/18/2014  . Hip pain 01/07/2014  . Transaminitis 11/13/2012  . Hyperlipidemia 04/04/2011  . Pedal edema 04/04/2011  . Diabetes ([place]) 06/07/2010  . Cancer of central portion of right female breast ([place]) 10/04/2009  . Idiopathic thrombocytopenic purpura (ITP) ([place]) 06/22/2009  . Essential hypertension 06/22/2009  . HYPERPARATHYROIDISM, HX OF 06/22/2009  . Avitaminosis D 09/01/2008    Past Surgical History:  Procedure Laterality Date  . ABDOMINAL HYSTERECTOMY     1996 per pt  . BREAST BIOPSY Right 06/2009  . BREAST SURGERY   09/12/10   excision of skin mass - right breast, had another surgery to remove more Cancer cells  . INNER EAR SURGERY  06/01/13  . MASTECTOMY Right 07/21/2009  . PARATHYROIDECTOMY     partial?  . REDUCTION MAMMAPLASTY Left 2013    OB History   No obstetric history on file.      Home Medications    Prior to Admission medications   Medication Sig Start Date End Date Taking? Authorizing Provider  Blood Glucose Monitoring Suppl (ACCU-CHEK AVIVA PLUS) w/Device KIT Use to test blood sugar daily 09/09/18   Zenia Resides, MD  carvedilol (COREG) 12.5 MG tablet TAKE 1 TABLET BY MOUTH TWICE DAILY WITH A MEAL 05/31/19   Bland, Scott, DO  cephALEXin (KEFLEX) 500 MG capsule Take 1 capsule (500 mg total) by mouth 4 (four) times daily. 06/24/19   Tasia Catchings, Dimitri Dsouza V, PA-C  furosemide (LASIX) 20 MG tablet Take 1 tablet (20 mg total) by mouth every other day. FURTHER REFILLS FROM PRIMARY CARE DOCTOR 01/05/19   Minus Breeding, MD  glucose blood (ACCU-CHEK ACTIVE STRIPS) test strip Use to test blood sugar daily 09/09/18   Zenia Resides, MD  hydrochlorothiazide (MICROZIDE) 12.5 MG capsule Take 1 capsule by mouth once daily 05/18/19   [name], DO  Lancet Devices (ONE TOUCH DELICA LANCING DEV) MISC Use to test blood sugar once daily.  Dx Code: E11.9 09/09/18   Zenia Resides, MD  Lancets Misc. MISC Use as directed to check blood sugar 12/15/18  Dickie La, MD  lisinopril (ZESTRIL) 40 MG tablet Take 1 tablet by mouth once daily 04/21/19   [name], DO  metFORMIN (GLUCOPHAGE-XR) 500 MG 24 hr tablet Take 1 tablet by mouth once daily with breakfast 05/03/19   [name], DO  mupirocin ointment (BACTROBAN) 2 % Apply 1 application topically 2 (two) times daily. 06/24/19   Tasia Catchings, Aidin Doane V, PA-C  rosuvastatin (CRESTOR) 10 MG tablet TAKE 1 TABLET BY MOUTH THREE TIMES A WEEK 06/02/19   [name], DO  tamoxifen (NOLVADEX) 20 MG tablet Take 1 tablet (20 mg total) by mouth daily. 11/25/18   Nicholas Lose, MD  traMADol  Veatrice [place]) 50 MG tablet Take 1 tablet by mouth twice daily as needed 03/04/19   [name], DO    Family History Family History  Problem Relation Age of Onset  . CAD Mother 43       CABG  . Stroke Father   . Heart attack Brother 82  . Colon cancer Neg Hx   . Esophageal cancer Neg Hx   . Rectal cancer Neg Hx   . Stomach cancer Neg Hx     Social History Social History   Tobacco Use  . Smoking status: Never Smoker  . Smokeless tobacco: Never Used  Substance Use Topics  . Alcohol use: No  . Drug use: No     Allergies   Patient has no known allergies.   Review of Systems Review of Systems  Reason unable to perform ROS: See HPI as above.     Physical Exam Triage Vital Signs ED Triage Vitals  Enc Vitals Group     BP 06/24/19 1143 (!) 155/83     Pulse Rate 06/24/19 1143 80     Resp 06/24/19 1143 16     Temp 06/24/19 1143 98.7 F (37.1 C)     Temp Source 06/24/19 1143 Oral     SpO2 06/24/19 1143 96 %     Weight --      Height --      Head Circumference --      Peak Flow --      Pain Score 06/24/19 1208 0     Pain Loc --      Pain Edu? --      Excl. in [place]? --    No data found.  Updated Vital Signs BP (!) 155/83 (BP Location: Left Arm)   Pulse 80   Temp 98.7 F (37.1 C) (Oral)   Resp 16   SpO2 96%   Physical Exam Constitutional:      General: She is not in acute distress.    Appearance: Normal appearance. She is well-developed. She is not toxic-appearing or diaphoretic.  HENT:     Head: Normocephalic and atraumatic.  Eyes:     Conjunctiva/sclera: Conjunctivae normal.     Pupils: Pupils are equal, round, and reactive to light.  Pulmonary:     Effort: Pulmonary effort is normal. No respiratory distress.     Comments: Speaking in full sentences without difficulty Genitourinary:    Comments: Folliculitis to the left inner labia majora. Surrounding swelling. No erythema, warmth. No tenderness to palpation.  Musculoskeletal:     Cervical back: Normal  range of motion and neck supple.  Skin:    General: Skin is warm and dry.  Neurological:     Mental Status: She is alert and oriented to person, place, and time.    UC Treatments / Results  Labs (all labs ordered  are listed, but only abnormal results are displayed) Labs Reviewed - No data to display  EKG   Radiology No results found.  Procedures Procedures (including critical care time)  Medications Ordered in UC Medications - No data to display  Initial Impression / Assessment and Plan / UC Course  I have reviewed the triage vital signs and the nursing notes.  Pertinent labs & imaging results that were available during my care of the patient were reviewed by me and considered in my medical decision making (see chart for details).    No signs of abscess at this time. Will do warm compresses, bactroban for now. Rx of keflex provided, if symptoms not improving with warm compress, to fill as directed. Return precautions given.  Final Clinical Impressions(s) / UC Diagnoses   Final diagnoses:  Folliculitis   ED Prescriptions    Medication Sig Dispense Auth. Provider   cephALEXin (KEFLEX) 500 MG capsule Take 1 capsule (500 mg total) by mouth 4 (four) times daily. 28 capsule Nicki Furlan V, PA-C   mupirocin ointment (BACTROBAN) 2 % Apply 1 application topically 2 (two) times daily. 22 g Ok Edwards, PA-C     PDMP not reviewed this encounter.   Ok Edwards, PA-C 06/24/19 1235

## 2019-06-24 NOTE — Discharge Instructions (Addendum)
No signs of abscess at this time. Bactroban ointment to affected area. Warm soaks/compresses for at least 10-15 mins at a time, ideally 2-3 times a day. If symptoms improving, continue soaks until resolution. If symptoms persists, worsen, start keflex as directed and continue soaks. If symptoms still not improving, significant worsening of swelling, pain, follow up for reevaluation.

## 2019-06-24 NOTE — ED Triage Notes (Signed)
Pt c/o vaginal irritation x2 day from where she shaved. Denies any other sx's

## 2019-07-14 ENCOUNTER — Ambulatory Visit
Admission: RE | Admit: 2019-07-14 | Discharge: 2019-07-14 | Disposition: A | Discharge status: Home / Self Care | Payer: Medicare Other | Source: Ambulatory Visit | Attending: Hematology and Oncology | Admitting: Hematology and Oncology

## 2019-07-14 ENCOUNTER — Other Ambulatory Visit: Payer: Self-pay

## 2019-07-14 DIAGNOSIS — Z1231 Encounter for screening mammogram for malignant neoplasm of breast: Secondary | ICD-10-CM

## 2019-07-19 ENCOUNTER — Ambulatory Visit: Payer: Medicare Other | Admitting: Family Medicine

## 2019-07-26 ENCOUNTER — Other Ambulatory Visit: Payer: Self-pay

## 2019-07-26 ENCOUNTER — Other Ambulatory Visit: Payer: Self-pay | Admitting: Family Medicine

## 2019-07-26 DIAGNOSIS — E785 Hyperlipidemia, unspecified: Secondary | ICD-10-CM

## 2019-07-26 DIAGNOSIS — I10 Essential (primary) hypertension: Secondary | ICD-10-CM

## 2019-07-26 MED ORDER — ROSUVASTATIN CALCIUM 10 MG PO TABS: ORAL_TABLET | ORAL | 0 refills | Status: DC

## 2019-07-26 MED ORDER — LISINOPRIL 40 MG PO TABS: 40.0000 mg | ORAL_TABLET | Freq: Every day | ORAL | 0 refills | Status: DC

## 2019-07-26 NOTE — Telephone Encounter (Signed)
Lisinopril refilled for 30 days, last BMP is now-year-old and patient will need to present to the office for another recheck prior to further refills.  Dr. Criss Rosales

## 2019-07-30 NOTE — Telephone Encounter (Signed)
Contacted pt and appointment scheduled.  Pt did mention that she will probably request a female PCP. [name], CMA

## 2019-08-03 ENCOUNTER — Other Ambulatory Visit: Payer: Self-pay | Admitting: Family Medicine

## 2019-08-24 ENCOUNTER — Ambulatory Visit (INDEPENDENT_AMBULATORY_CARE_PROVIDER_SITE_OTHER): Payer: Medicare Other | Admitting: Family Medicine

## 2019-08-24 ENCOUNTER — Other Ambulatory Visit: Payer: Self-pay

## 2019-08-24 ENCOUNTER — Encounter: Payer: Self-pay | Admitting: Family Medicine

## 2019-08-24 VITALS — BP 138/94 | HR 86 | Ht 64.0 in | Wt 189.8 lb

## 2019-08-24 DIAGNOSIS — R600 Localized edema: Secondary | ICD-10-CM | POA: Diagnosis not present

## 2019-08-24 DIAGNOSIS — I10 Essential (primary) hypertension: Secondary | ICD-10-CM

## 2019-08-24 DIAGNOSIS — E11319 Type 2 diabetes mellitus with unspecified diabetic retinopathy without macular edema: Secondary | ICD-10-CM | POA: Diagnosis not present

## 2019-08-24 LAB — POCT GLYCOSYLATED HEMOGLOBIN (HGB A1C): HbA1c, POC (controlled diabetic range): 6.9 % (ref 0.0–7.0)

## 2019-08-24 NOTE — Assessment & Plan Note (Signed)
Patient reports that her blood pressures are well controlled but they are elevated today because she did not take her medication prior to her doctor's visit.  Blood pressure today was 138/94. -Continue current blood pressure medications -BMP today

## 2019-08-24 NOTE — Progress Notes (Signed)
    SUBJECTIVE:   CHIEF COMPLAINT / HPI:   Diabetes Patient reports her diabetes is doing well at this time.  Reports her blood glucoses range 2130 and 150.  Denies any hypoglycemic events.  Is currently taking 500 mg Metformin once daily which she reports she is compliant with.  No signs or symptoms of hyper or hypoglycemia other than patient reporting her urine smells strange but that is unchanged from before.  Lower extremity edema Patient reports lower extremity edema comes and goes but that it has been getting worse.  She reports taking 20 mg Lasix every Monday Wednesday Friday.  She does see good response but does not want to take too much of the Lasix and is afraid she may hurt her kidneys.  We discussed ways to help with the lower extremity edema such as elevation of her feet.  Patient says that the only thing that bothers her is that she has heel spurs and when her feet are swollen it hurts worse for her to wear shoes.  Hypertension Patient reports that she did not take her blood pressure medication this morning because she was coming to the doctor but that she usually does take her blood pressure medication.  Her blood pressure today was 138/94.  OBJECTIVE:   BP (!) 138/94   Pulse 86   Ht 5\' 4"  (1.626 m)   Wt 189 lb 12.8 oz (86.1 kg)   SpO2 100%   BMI 32.58 kg/m   General: Well-appearing, no acute distress, sitting comfortably on exam table Respiratory: Normal work of breathing, clear to auscultation bilaterally Cardiac: Regular rate and rhythm, no murmurs appreciated Abdomen: Soft, nontender, positive bowel sounds  Diabetic foot exam was performed with the following findings:   No deformities, ulcerations, or other skin breakdown Normal sensation of 10g monofilament Intact posterior tibialis and dorsalis pedis pulses      ASSESSMENT/PLAN:   Essential hypertension Patient reports that her blood pressures are well controlled but they are elevated today because she did  not take her medication prior to her doctor's visit.  Blood pressure today was 138/94. -Continue current blood pressure medications -BMP today  Diabetes (HCC) Patient's hemoglobin A1c was 6.9 from 6.5 at last check.  Patient is currently on Metformin 500 mg once daily.  Patient reports that she will make dietary changes and we do not need to change her current medications at this time. -Continue Metformin 500 mg daily -BMP today to assess kidney function -Follow-up in 3 months  Pedal edema Patient is currently taking furosemide 20 mg every Monday Wednesday and Friday.  Reports that this is working "okay" but that she has noticed her legs are swelling a little bit more recently.  We discussed methods to help decrease the edema such as elevation.  I also mentioned taking an extra dose of her furosemide on her off days which she is okay with but would like to make sure her kidneys are okay. -BMP today -Encouraged elevation of lower extremities as well as pression stockings -May take extra dose of Lasix on off days if needed to decrease lower extremity edema     Gifford Shave, MD [place]

## 2019-08-24 NOTE — Assessment & Plan Note (Signed)
>>  ASSESSMENT AND PLAN FOR DM (DIABETES MELLITUS) WITH COMPLICATIONS (HCC) WRITTEN ON 08/24/2019  4:16 PM BY CRESENZO, VICTOR, MD  Patient's hemoglobin A1c was 6.9 from 6.5 at last check.  Patient is currently on Metformin  500 mg once daily.  Patient reports that she will make dietary changes and we do not need to change her current medications at this time. -Continue Metformin  500 mg daily -BMP today to assess kidney function -Follow-up in 3 months

## 2019-08-24 NOTE — Assessment & Plan Note (Signed)
Patient's hemoglobin A1c was 6.9 from 6.5 at last check.  Patient is currently on Metformin 500 mg once daily.  Patient reports that she will make dietary changes and we do not need to change her current medications at this time. -Continue Metformin 500 mg daily -BMP today to assess kidney function -Follow-up in 3 months

## 2019-08-24 NOTE — Patient Instructions (Addendum)
It was a pleasure to meet you today!  Your hemoglobin A1c today was 6.9 from 6.5 with the last check.  You are still below 7 so I am going to keep your Metformin where it is at this time.  We are going to take some blood work to check your kidney function.  I will call you with those results.  If it is normal you can take an extra dose of your Lasix on your off days to help with the swelling if needed.  If you have any questions or concerns please feel free to call our clinic.  I hope you have a wonderful afternoon!  Edema  Edema is when you have too much fluid in your body or under your skin. Edema may make your legs, feet, and ankles swell up. Swelling is also common in looser tissues, like around your eyes. This is a common condition. It gets more common as you get older. There are many possible causes of edema. Eating too much salt (sodium) and being on your feet or sitting for a long time can cause edema in your legs, feet, and ankles. Hot weather may make edema worse. Edema is usually painless. Your skin may look swollen or shiny. Follow these instructions at home:  Keep the swollen body part raised (elevated) above the level of your heart when you are sitting or lying down.  Do not sit still or stand for a long time.  Do not wear tight clothes. Do not wear garters on your upper legs.  Exercise your legs. This can help the swelling go down.  Wear elastic bandages or support stockings as told by your doctor.  Eat a low-salt (low-sodium) diet to reduce fluid as told by your doctor.  Depending on the cause of your swelling, you may need to limit how much fluid you drink (fluid restriction).  Take over-the-counter and prescription medicines only as told by your doctor. Contact a doctor if:  Treatment is not working.  You have heart, liver, or kidney disease and have symptoms of edema.  You have sudden and unexplained weight gain. Get help right away if:  You have shortness of breath  or chest pain.  You cannot breathe when you lie down.  You have pain, redness, or warmth in the swollen areas.  You have heart, liver, or kidney disease and get edema all of a sudden.  You have a fever and your symptoms get worse all of a sudden. Summary  Edema is when you have too much fluid in your body or under your skin.  Edema may make your legs, feet, and ankles swell up. Swelling is also common in looser tissues, like around your eyes.  Raise (elevate) the swollen body part above the level of your heart when you are sitting or lying down.  Follow your doctor's instructions about diet and how much fluid you can drink (fluid restriction). This information is not intended to replace advice given to you by your health care provider. Make sure you discuss any questions you have with your health care provider. Document Revised: 01/24/2017 Document Reviewed: 02/09/2016 Elsevier Patient Education  2020 [employer].

## 2019-08-24 NOTE — Assessment & Plan Note (Signed)
Patient is currently taking furosemide 20 mg every Monday Wednesday and Friday.  Reports that this is working "okay" but that she has noticed her legs are swelling a little bit more recently.  We discussed methods to help decrease the edema such as elevation.  I also mentioned taking an extra dose of her furosemide on her off days which she is okay with but would like to make sure her kidneys are okay. -BMP today -Encouraged elevation of lower extremities as well as pression stockings -May take extra dose of Lasix on off days if needed to decrease lower extremity edema

## 2019-08-25 ENCOUNTER — Other Ambulatory Visit: Payer: Self-pay | Admitting: Family Medicine

## 2019-08-25 DIAGNOSIS — I10 Essential (primary) hypertension: Secondary | ICD-10-CM

## 2019-08-25 LAB — BASIC METABOLIC PANEL
BUN/Creatinine Ratio: 14 (ref 9–23)
BUN: 13 mg/dL (ref 6–24)
CO2: 24 mmol/L (ref 20–29)
Calcium: 9.4 mg/dL (ref 8.7–10.2)
Chloride: 99 mmol/L (ref 96–106)
Creatinine, Ser: 0.92 mg/dL (ref 0.57–1.00)
GFR calc Af Amer: 79 mL/min/{1.73_m2} (ref >59)
GFR calc non Af Amer: 68 mL/min/{1.73_m2} (ref >59)
Glucose: 160 mg/dL — ABNORMAL HIGH (ref 65–99)
Potassium: 3.9 mmol/L (ref 3.5–5.2)
Sodium: 141 mmol/L (ref 134–144)

## 2019-08-27 ENCOUNTER — Other Ambulatory Visit: Payer: Self-pay | Admitting: Family Medicine

## 2019-09-22 ENCOUNTER — Other Ambulatory Visit: Payer: Self-pay | Admitting: Family Medicine

## 2019-09-22 DIAGNOSIS — I10 Essential (primary) hypertension: Secondary | ICD-10-CM

## 2019-09-23 ENCOUNTER — Encounter: Payer: Self-pay | Admitting: Genetic Counselor

## 2019-09-24 ENCOUNTER — Other Ambulatory Visit: Payer: Self-pay | Admitting: Family Medicine

## 2019-09-24 DIAGNOSIS — I10 Essential (primary) hypertension: Secondary | ICD-10-CM

## 2019-09-27 ENCOUNTER — Other Ambulatory Visit: Payer: Self-pay | Admitting: *Deleted

## 2019-09-27 DIAGNOSIS — E785 Hyperlipidemia, unspecified: Secondary | ICD-10-CM

## 2019-09-27 MED ORDER — ROSUVASTATIN CALCIUM 10 MG PO TABS: ORAL_TABLET | ORAL | 0 refills | Status: DC

## 2019-10-25 ENCOUNTER — Other Ambulatory Visit: Payer: Self-pay | Admitting: *Deleted

## 2019-10-25 DIAGNOSIS — M722 Plantar fascial fibromatosis: Secondary | ICD-10-CM | POA: Diagnosis not present

## 2019-10-25 DIAGNOSIS — M7732 Calcaneal spur, left foot: Secondary | ICD-10-CM | POA: Diagnosis not present

## 2019-10-25 DIAGNOSIS — M25572 Pain in left ankle and joints of left foot: Secondary | ICD-10-CM | POA: Diagnosis not present

## 2019-10-25 DIAGNOSIS — M7662 Achilles tendinitis, left leg: Secondary | ICD-10-CM | POA: Diagnosis not present

## 2019-10-25 MED ORDER — METFORMIN HCL ER 500 MG PO TB24: ORAL_TABLET | ORAL | 0 refills | Status: DC

## 2019-11-02 ENCOUNTER — Other Ambulatory Visit: Payer: Self-pay | Admitting: *Deleted

## 2019-11-02 DIAGNOSIS — I10 Essential (primary) hypertension: Secondary | ICD-10-CM

## 2019-11-02 MED ORDER — CARVEDILOL 12.5 MG PO TABS: ORAL_TABLET | ORAL | 0 refills | Status: DC

## 2019-11-10 ENCOUNTER — Encounter: Payer: Self-pay | Admitting: Internal Medicine

## 2019-11-10 ENCOUNTER — Other Ambulatory Visit (INDEPENDENT_AMBULATORY_CARE_PROVIDER_SITE_OTHER): Payer: Medicare Other

## 2019-11-10 ENCOUNTER — Ambulatory Visit (INDEPENDENT_AMBULATORY_CARE_PROVIDER_SITE_OTHER): Payer: Medicare Other | Admitting: Internal Medicine

## 2019-11-10 VITALS — BP 160/96 | HR 106 | Ht 64.0 in | Wt 192.0 lb

## 2019-11-10 DIAGNOSIS — R7989 Other specified abnormal findings of blood chemistry: Secondary | ICD-10-CM | POA: Diagnosis not present

## 2019-11-10 DIAGNOSIS — K760 Fatty (change of) liver, not elsewhere classified: Secondary | ICD-10-CM

## 2019-11-10 LAB — COMPREHENSIVE METABOLIC PANEL
ALT: 46 U/L — ABNORMAL HIGH (ref 0–35)
AST: 67 U/L — ABNORMAL HIGH (ref 0–37)
Albumin: 4 g/dL (ref 3.5–5.2)
Alkaline Phosphatase: 35 U/L — ABNORMAL LOW (ref 39–117)
BUN: 12 mg/dL (ref 6–23)
CO2: 27 mEq/L (ref 19–32)
Calcium: 9.4 mg/dL (ref 8.4–10.5)
Chloride: 102 mEq/L (ref 96–112)
Creatinine, Ser: 0.89 mg/dL (ref 0.40–1.20)
GFR: 70.61 mL/min (ref >60.00)
Glucose, Bld: 165 mg/dL — ABNORMAL HIGH (ref 70–99)
Potassium: 3.8 mEq/L (ref 3.5–5.1)
Sodium: 140 mEq/L (ref 135–145)
Total Bilirubin: 0.5 mg/dL (ref 0.2–1.2)
Total Protein: 7.3 g/dL (ref 6.0–8.3)

## 2019-11-10 LAB — CBC WITH DIFFERENTIAL/PLATELET
Basophils Absolute: 0.1 10*3/uL (ref 0.0–0.1)
Basophils Relative: 0.6 % (ref 0.0–3.0)
Eosinophils Absolute: 0.2 10*3/uL (ref 0.0–0.7)
Eosinophils Relative: 2.3 % (ref 0.0–5.0)
HCT: 40.2 % (ref 36.0–46.0)
Hemoglobin: 13 g/dL (ref 12.0–15.0)
Lymphocytes Relative: 37.5 % (ref 12.0–46.0)
Lymphs Abs: 3.2 10*3/uL (ref 0.7–4.0)
MCHC: 32.3 g/dL (ref 30.0–36.0)
MCV: 77.1 fl — ABNORMAL LOW (ref 78.0–100.0)
Monocytes Absolute: 0.7 10*3/uL (ref 0.1–1.0)
Monocytes Relative: 8 % (ref 3.0–12.0)
Neutro Abs: 4.4 10*3/uL (ref 1.4–7.7)
Neutrophils Relative %: 51.6 % (ref 43.0–77.0)
Platelets: 71 10*3/uL — ABNORMAL LOW (ref 150.0–400.0)
RBC: 5.22 Mil/uL — ABNORMAL HIGH (ref 3.87–5.11)
RDW: 14.9 % (ref 11.5–15.5)
WBC: 8.6 10*3/uL (ref 4.0–10.5)

## 2019-11-10 LAB — PROTIME-INR
INR: 1.1 ratio — ABNORMAL HIGH (ref 0.8–1.0)
Prothrombin Time: 12.1 s (ref 9.6–13.1)

## 2019-11-10 NOTE — Patient Instructions (Signed)
Your provider has requested that you go to the basement level for lab work before leaving today. Press "B" on the elevator. The lab is located at the first door on the left as you exit the elevator.   As discussed with Dr. Henrene Pastor, work on exercise and weight loss.  Please follow up in one year

## 2019-11-10 NOTE — Progress Notes (Signed)
HISTORY OF PRESENT ILLNESS:  [name] is a 59 y.o. female with a history of breast cancer and mild thrombocytopenia for which she is followed by oncology.  Patient is followed in this office for colon cancer screening and fatty liver disease.  She was last evaluated October 28, 2018.  See that dictation for details.  We discussed exercise weight loss as the mainstay of therapy.  She has not had significant weight loss see his current BMI 33).  Overall she has been well since her last visit.  No hospitalizations or surgeries.  No swelling or GI bleeding.  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  She continues on her diabetic medications and antihypertensives.  Last complete colonoscopy March 2014 was negative for neoplasia.  Moderate diverticulosis noted.  GI review of systems is negative.  Completed her Covid vaccination series  REVIEW OF SYSTEMS:  All non-GI ROS negative unless otherwise stated in the HPI except for arthritis  Past Medical History:  Diagnosis Date  . Arthritis   . Breast CA ([place])    breast CA- right breast  . Diabetes mellitus   . Hyperlipidemia   . Hypertension   . Thrombocytopenia ([place])   . Thyroid disease    parathyroid removed    Past Surgical History:  Procedure Laterality Date  . ABDOMINAL HYSTERECTOMY     1996 per pt  . BREAST BIOPSY Right 06/2009  . BREAST SURGERY  09/12/10   excision of skin mass - right breast, had another surgery to remove more Cancer cells  . INNER EAR SURGERY  06/01/13  . MASTECTOMY Right 07/21/2009  . PARATHYROIDECTOMY     partial?  . REDUCTION MAMMAPLASTY Left 2013    Social History [name]  reports that she has never smoked. She has never used smokeless tobacco. She reports that she does not drink alcohol and does not use drugs.  family history includes CAD (age of onset: 95) in her mother; Heart attack (age of onset: 65) in her brother; Stroke in her father.  No Known Allergies      PHYSICAL EXAMINATION: Vital signs: BP (!) 160/96   Pulse (!) 106   Ht 5\' 4"  (1.626 m)   Wt 192 lb (87.1 kg)   BMI 32.96 kg/m   Constitutional: generally well-appearing, no acute distress Psychiatric: alert and oriented x3, cooperative Eyes: extraocular movements intact, anicteric, conjunctiva pink Mouth: oral pharynx moist, no lesions Neck: supple no lymphadenopathy Cardiovascular: heart regular rate and rhythm, no murmur Lungs: clear to auscultation bilaterally Abdomen: soft, nontender, nondistended, no obvious ascites, no peritoneal signs, normal bowel sounds, no organomegaly Rectal: Extremities: no clubbing, cyanosis, or lower extremity edema bilaterally Skin: no lesions on visible extremities Neuro: No focal deficits. No asterixis.    ASSESSMENT:  1.  Fatty liver disease.  No evidence for cirrhosis. 2.  Obesity 3.  Colonoscopy 2014 with diverticulosis 4.  History of breast cancer   PLAN:  1.  Exercise weight loss.  This was again stressed.  We discussed low impact exercise. 2.  Blood work today including CBC, comprehensive metabolic panel, PT/INR.  We will contact patient with results when available. 3.  Screening colonoscopy 2024 4.  Routine GI office follow-up 1 year.  Sooner if needed 5.  Ongoing general medical care with PCP

## 2019-11-15 DIAGNOSIS — H40013 Open angle with borderline findings, low risk, bilateral: Secondary | ICD-10-CM | POA: Diagnosis not present

## 2019-11-15 DIAGNOSIS — E119 Type 2 diabetes mellitus without complications: Secondary | ICD-10-CM | POA: Diagnosis not present

## 2019-11-15 DIAGNOSIS — H25813 Combined forms of age-related cataract, bilateral: Secondary | ICD-10-CM | POA: Diagnosis not present

## 2019-11-15 LAB — HM DIABETES EYE EXAM

## 2019-11-24 ENCOUNTER — Telehealth: Payer: Self-pay | Admitting: Family Medicine

## 2019-11-24 ENCOUNTER — Other Ambulatory Visit: Payer: Self-pay | Admitting: *Deleted

## 2019-11-24 DIAGNOSIS — I10 Essential (primary) hypertension: Secondary | ICD-10-CM

## 2019-11-24 MED ORDER — LISINOPRIL 40 MG PO TABS: 40.0000 mg | ORAL_TABLET | Freq: Every day | ORAL | 1 refills | Status: DC

## 2019-11-24 NOTE — Telephone Encounter (Signed)
Refill sent to patients pharmacy. 

## 2019-11-24 NOTE — Telephone Encounter (Signed)
Patient is calling needing a refill on her Lisinopril 40mg . Thanks

## 2019-11-24 NOTE — Assessment & Plan Note (Signed)
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 Started Tamoxifen 09/2010  Tamoxifen toxicities: 1. Hot flashes improved with Effexor 2. joint and muscle stiffness: I encouraged her to exercise and stay active. Plan is to continue tamoxifen for 10 years  Breast cancer surveillance: 1. Breast exam 10/21/2021is normal 2. Mammogram  07/15/2019 left breastis benign on the left breast category B breast density 3. MRI abdomen: Done to evaluate lesions in the liver.  There are simple and minimally complex hepatic cysts.  Severe hepatic steatosis.  Chronic mild thrombocytopenia: 11/2019: Platelets 71 Microcytosis: Patient probably has thalassemia.  Return to clinic in 1 year for follow-up

## 2019-11-24 NOTE — Progress Notes (Signed)
Patient Care Team: Gifford Shave, MD as PCP - General (Family Medicine) Minus Breeding, MD as PCP - Cardiology (Cardiology)  DIAGNOSIS:    ICD-10-CM   1. Malignant neoplasm of central portion of right breast in female, estrogen receptor positive ([place])  C50.111    Z17.0     SUMMARY OF ONCOLOGIC HISTORY: Oncology History  Cancer of central portion of right female breast ([place])  07/21/2009 Surgery   Right breast mastectomy: 2 foci of invasive ductal carcinoma 2.2 cm, 0.5 cm, ER positive PR positive HER-2 negative Ki-67 11%, Oncotype score 27, 18% ROR   10/23/2009 - 12/25/2009 Chemotherapy   Adjuvant chemotherapy with Taxotere and Cytoxan every 3 weeks 4 cycles followed by reconstruction of the breast by Dr. Harlow Mares   02/13/2010 -  Anti-estrogen oral therapy   Tamoxifen 20 mg daily, plan for 10 years    02/19/2010 - 03/28/2010 Radiation Therapy   Adjuvant radiation therapy   09/12/2010 Surgery   Recurrence of invasive ductal carcinoma of the right mastectomy scar is a nodule, invasive ductal carcinoma margins positive, ER 96%, PR 75%, HER-2 negative   09/17/2013 Surgery   Left breast reduction surgery     CHIEF COMPLIANT: Follow-up of recurrent right breast cancer on tamoxifen therapy  INTERVAL HISTORY: [name] is a 59 y.o. with above-mentioned history of recurrent right breast cancer treated with right mastectomy, adjuvant chemotherapy, radiation, and who is currently on tamoxifen. Mammogram of the left breast on 07/14/19 showed no evidence of malignancy. She presents to the clinic today for follow-up.   She finishes 10 years of antiestrogen therapy with tamoxifen as of January 2022.  She continues to have hot flashes which are well controlled with the Effexor.  She is looking forward to discontinuing antiestrogen therapy.  Denies any lumps or nodules in the breast.  ALLERGIES:  has No Known Allergies.  MEDICATIONS:  Current Outpatient Medications  Medication Sig Dispense  Refill  . Blood Glucose Monitoring Suppl (ACCU-CHEK AVIVA PLUS) w/Device KIT Use to test blood sugar daily 1 kit 0  . carvedilol (COREG) 12.5 MG tablet TAKE 1 TABLET BY MOUTH TWICE DAILY WITH A MEAL 180 tablet 0  . furosemide (LASIX) 20 MG tablet Take 1 tablet (20 mg total) by mouth every other day. FURTHER REFILLS FROM PRIMARY CARE DOCTOR 45 tablet 3  . glucose blood (ACCU-CHEK ACTIVE STRIPS) test strip Use to test blood sugar daily 100 each 12  . hydrochlorothiazide (MICROZIDE) 12.5 MG capsule Take 1 capsule by mouth once daily 90 capsule 0  . Lancet Devices (ONE TOUCH DELICA LANCING DEV) MISC Use to test blood sugar once daily.  Dx Code: E11.9 1 each 0  . Lancets Misc. MISC Use as directed to check blood sugar 100 each 12  . lisinopril (ZESTRIL) 40 MG tablet Take 1 tablet (40 mg total) by mouth daily. 90 tablet 1  . metFORMIN (GLUCOPHAGE-XR) 500 MG 24 hr tablet Take one tablet (500 mg)once daily with breakfast 90 tablet 0  . rosuvastatin (CRESTOR) 10 MG tablet TAKE 1 TABLET BY MOUTH THREE TIMES A WEEK 18 tablet 0  . tamoxifen (NOLVADEX) 20 MG tablet Take 1 tablet (20 mg total) by mouth daily. 90 tablet 3  . traMADol (ULTRAM) 50 MG tablet Take 1 tablet by mouth twice daily as needed 60 tablet 0   No current facility-administered medications for this visit.    PHYSICAL EXAMINATION: ECOG PERFORMANCE STATUS: 1 - Symptomatic but completely ambulatory  Vitals:   11/25/19 0817  BP: Marland Kitchen)  166/87  Pulse: 94  Resp: 18  Temp: (!) 97.4 F (36.3 C)  SpO2: 98%   Filed Weights   11/25/19 0817  Weight: 193 lb 8 oz (87.8 kg)    BREAST: No palpable masses or nodules in either right or left breasts. No palpable axillary supraclavicular or infraclavicular adenopathy no breast tenderness or nipple discharge. (exam performed in the presence of a chaperone)  LABORATORY DATA:  I have reviewed the data as listed CMP Latest Ref Rng & Units 11/10/2019 08/24/2019 07/14/2018  Glucose 70 - 99 mg/dL 165(H)  160(H) 179(H)  BUN 6 - 23 mg/dL '12 13 16  ' Creatinine 0.40 - 1.20 mg/dL 0.89 0.92 1.00  Sodium 135 - 145 mEq/L 140 141 139  Potassium 3.5 - 5.1 mEq/L 3.8 3.9 4.1  Chloride 96 - 112 mEq/L 102 99 98  CO2 19 - 32 mEq/L '27 24 23  ' Calcium 8.4 - 10.5 mg/dL 9.4 9.4 10.0  Total Protein 6.0 - 8.3 g/dL 7.3 - 6.9  Total Bilirubin 0.2 - 1.2 mg/dL 0.5 - 0.2  Alkaline Phos 39 - 117 U/L 35(L) - 48  AST 0 - 37 U/L 67(H) - 47(H)  ALT 0 - 35 U/L 46(H) - 40(H)    Lab Results  Component Value Date   WBC 8.6 11/10/2019   HGB 13.0 11/10/2019   HCT 40.2 11/10/2019   MCV 77.1 (L) 11/10/2019   PLT 71.0 (L) 11/10/2019   NEUTROABS 4.4 11/10/2019    ASSESSMENT & PLAN:  Cancer of central portion of right female breast ([place]) Right breast invasive ductal carcinoma multifocal disease 2.2 cm and 0.5 cm ER/PR positive HER-2 negative with Ki-67 11%, Oncotype DX score 27, 18% risk of recurrence, adjuvant chemotherapy with TC 4 completed 12/25/2009 followed by breast reconstruction, radiation and tamoxifen started January 2012; chest wall recurrence August 2012 resected with positive margins and Radiated.  On tamoxifen since 02/2010  Tamoxifen toxicities: 1. Hot flashes improved with Effexor 2. joint and muscle stiffness: I encouraged her to exercise and stay active. Plan is to continue tamoxifen for 10 years  Breast cancer surveillance: 1. Breast exam 10/21/2021is normal 2. Mammogram  07/15/2019 left breastis benign on the left breast category B breast density 3. MRI abdomen: Done to evaluate lesions in the liver.  There are simple and minimally complex hepatic cysts.  Severe hepatic steatosis.  Chronic mild thrombocytopenia: 11/2019: Platelets 71, probably related to fatty liver splenomegaly versus secondary to prior chemotherapy Microcytosis: Patient probably has thalassemia. Fatty liver: I discussed with her that this is very important that she take care of her liver health.  She needs to exercise cut  down the carbs and lose weight. I instructed her that she needs to lose gradually with goals that are reachable.  Return to clinic on an as-needed basis.  No orders of the defined types were placed in this encounter.  The patient has a good understanding of the overall plan. she agrees with it. she will call with any problems that may develop before the next visit here.  Total time spent: 20 mins including face to face time and time spent for planning, charting and coordination of care  Nicholas Lose, MD 11/25/2019  I, Cloyde Reams Dorshimer, am acting as scribe for Dr. Nicholas Lose.  I have reviewed the above documentation for accuracy and completeness, and I agree with the above.

## 2019-11-25 ENCOUNTER — Inpatient Hospital Stay: Payer: Medicare Other | Attending: Hematology and Oncology | Admitting: Hematology and Oncology

## 2019-11-25 ENCOUNTER — Other Ambulatory Visit: Payer: Self-pay

## 2019-11-25 DIAGNOSIS — Z7984 Long term (current) use of oral hypoglycemic drugs: Secondary | ICD-10-CM | POA: Insufficient documentation

## 2019-11-25 DIAGNOSIS — Z9221 Personal history of antineoplastic chemotherapy: Secondary | ICD-10-CM | POA: Insufficient documentation

## 2019-11-25 DIAGNOSIS — Z923 Personal history of irradiation: Secondary | ICD-10-CM | POA: Insufficient documentation

## 2019-11-25 DIAGNOSIS — C50111 Malignant neoplasm of central portion of right female breast: Secondary | ICD-10-CM | POA: Diagnosis not present

## 2019-11-25 DIAGNOSIS — Z9011 Acquired absence of right breast and nipple: Secondary | ICD-10-CM | POA: Diagnosis not present

## 2019-11-25 DIAGNOSIS — Z79899 Other long term (current) drug therapy: Secondary | ICD-10-CM | POA: Insufficient documentation

## 2019-11-25 DIAGNOSIS — Z170 Estrogen receptor positive status [ER+]: Secondary | ICD-10-CM | POA: Insufficient documentation

## 2019-11-26 ENCOUNTER — Telehealth: Payer: Self-pay | Admitting: Hematology and Oncology

## 2019-11-26 NOTE — Telephone Encounter (Signed)
No 10/21 los, no changes made to pt schedule

## 2019-11-30 ENCOUNTER — Ambulatory Visit (INDEPENDENT_AMBULATORY_CARE_PROVIDER_SITE_OTHER): Payer: Medicare Other

## 2019-11-30 ENCOUNTER — Other Ambulatory Visit: Payer: Self-pay

## 2019-11-30 DIAGNOSIS — Z23 Encounter for immunization: Secondary | ICD-10-CM | POA: Diagnosis not present

## 2019-11-30 NOTE — Progress Notes (Signed)
Patient presents in Flu Clinic for Flu Vaccine.  Flu Vaccine administered LD without complication.  See admin for details.

## 2019-12-01 ENCOUNTER — Other Ambulatory Visit: Payer: Self-pay | Admitting: Family Medicine

## 2019-12-01 DIAGNOSIS — E785 Hyperlipidemia, unspecified: Secondary | ICD-10-CM

## 2019-12-06 ENCOUNTER — Other Ambulatory Visit: Payer: Self-pay | Admitting: Family Medicine

## 2019-12-06 DIAGNOSIS — I10 Essential (primary) hypertension: Secondary | ICD-10-CM

## 2019-12-10 ENCOUNTER — Other Ambulatory Visit: Payer: Self-pay | Admitting: Family Medicine

## 2019-12-12 ENCOUNTER — Other Ambulatory Visit: Payer: Self-pay | Admitting: Family Medicine

## 2019-12-13 ENCOUNTER — Other Ambulatory Visit: Payer: Self-pay | Admitting: *Deleted

## 2019-12-13 ENCOUNTER — Other Ambulatory Visit: Payer: Self-pay | Admitting: Family Medicine

## 2019-12-13 MED ORDER — TRAMADOL HCL 50 MG PO TABS: 50.0000 mg | ORAL_TABLET | Freq: Two times a day (BID) | ORAL | 0 refills | Status: DC | PRN

## 2019-12-15 ENCOUNTER — Other Ambulatory Visit: Payer: Self-pay

## 2019-12-15 ENCOUNTER — Ambulatory Visit (INDEPENDENT_AMBULATORY_CARE_PROVIDER_SITE_OTHER): Payer: Medicare Other

## 2019-12-15 DIAGNOSIS — Z23 Encounter for immunization: Secondary | ICD-10-CM

## 2019-12-15 NOTE — Progress Notes (Signed)
   Covid-19 Vaccination Clinic  Name:  [name]    MRN: [id number] DOB: 1960/12/23  12/15/2019  [name] was observed post Covid-19 immunization for 15 minutes without incident. She was provided with Vaccine Information Sheet and instruction to access the V-Safe system.   [name] was instructed to call 911 with any severe reactions post vaccine: Marland Kitchen Difficulty breathing  . Swelling of face and throat  . A fast heartbeat  . A bad rash all over body  . Dizziness and weakness   Booster administered LD without complication.

## 2019-12-27 ENCOUNTER — Ambulatory Visit
Admission: EM | Admit: 2019-12-27 | Discharge: 2019-12-27 | Disposition: A | Discharge status: Home / Self Care | Payer: Medicare Other

## 2019-12-27 ENCOUNTER — Other Ambulatory Visit: Payer: Self-pay

## 2019-12-27 DIAGNOSIS — I160 Hypertensive urgency: Secondary | ICD-10-CM

## 2019-12-27 NOTE — ED Provider Notes (Signed)
[facility]    CSN: [id number] Arrival date & time: 12/27/19  1618      History   Chief Complaint Chief Complaint  Patient presents with  . Hypertension    HPI [name] is a 59 y.o. female  Presenting for elevated BP readings at home x 2 days: 168-190/"90 something".  Denies change in diet, lifestyle, meds, though does admit to tramadol use for foot pain as well as poor sleep recently.  Denies SI/HI.  Followed routinely by PCP.  Overall feels well/at baseline.  Past Medical History:  Diagnosis Date  . Arthritis   . Breast CA ([place])    breast CA- right breast  . Diabetes mellitus   . Hyperlipidemia   . Hypertension   . Thrombocytopenia ([place])   . Thyroid disease    parathyroid removed    Patient Active Problem List   Diagnosis Date Noted  . Dysuria 04/01/2019  . Disability examination 04/01/2019  . Nonalcoholic hepatosteatosis 40/98/1191  . Abnormal urine odor 09/23/2018  . Hepatic lesion 08/11/2018  . Generalized edema 07/14/2018  . Need for immunization against influenza 12/04/2017  . Arthritis of both hands 04/03/2016  . Obesity 08/30/2015  . 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 Cataracts, bilateral 09/13/2014  . Glaucoma 09/13/2014  . Mitral regurgitation 02/18/2014  . Hip pain 01/07/2014  . Transaminitis 11/13/2012  . Hyperlipidemia 04/04/2011  . Pedal edema 04/04/2011  . Diabetes ([place]) 06/07/2010  . Cancer of central portion of right female breast ([place]) 10/04/2009  . Idiopathic thrombocytopenic purpura (ITP) ([place]) 06/22/2009  . Essential hypertension 06/22/2009  . HYPERPARATHYROIDISM, HX OF 06/22/2009  . Avitaminosis D 09/01/2008    Past Surgical History:  Procedure Laterality Date  . ABDOMINAL HYSTERECTOMY     1996 per pt  . BREAST BIOPSY Right 06/2009  . BREAST SURGERY  09/12/10   excision of skin mass - right breast, had another surgery to remove more Cancer cells  . INNER EAR SURGERY  06/01/13  . MASTECTOMY  Right 07/21/2009  . PARATHYROIDECTOMY     partial?  . REDUCTION MAMMAPLASTY Left 2013    OB History   No obstetric history on file.      Home Medications    Prior to Admission medications   Medication Sig Start Date End Date Taking? Authorizing Provider  rosuvastatin (CRESTOR) 10 MG tablet TAKE 1 TABLET BY MOUTH THREE TIMES A WEEK 12/01/19   Gifford Shave, MD  Blood Glucose Monitoring Suppl (ACCU-CHEK AVIVA PLUS) w/Device KIT Use to test blood sugar daily 09/09/18   Zenia Resides, MD  carvedilol (COREG) 12.5 MG tablet TAKE 1 TABLET BY MOUTH TWICE DAILY WITH A MEAL 11/02/19   Gifford Shave, MD  furosemide (LASIX) 20 MG tablet Take 1 tablet (20 mg total) by mouth every other day. FURTHER REFILLS FROM PRIMARY CARE DOCTOR 01/05/19   Minus Breeding, MD  glucose blood (ACCU-CHEK ACTIVE STRIPS) test strip Use to test blood sugar daily 09/09/18   Zenia Resides, MD  hydrochlorothiazide (MICROZIDE) 12.5 MG capsule Take 1 capsule by mouth once daily 12/13/19   Gifford Shave, MD  Lancet Devices (ONE TOUCH DELICA LANCING DEV) MISC Use to test blood sugar once daily.  Dx Code: E11.9 09/09/18   Zenia Resides, MD  Lancets Misc. MISC Use as directed to check blood sugar 12/15/18   Dickie La, MD  lisinopril (ZESTRIL) 40 MG tablet Take 1 tablet by mouth once daily 12/06/19   Gifford Shave, MD  metFORMIN (GLUCOPHAGE-XR) 500 MG 24 hr tablet Take one tablet (500 mg)once daily with breakfast 10/25/19   Gifford Shave, MD  traMADol (ULTRAM) 50 MG tablet Take 1 tablet (50 mg total) by mouth 2 (two) times daily as needed. 12/13/19   Gifford Shave, MD  hydrochlorothiazide (MICROZIDE) 12.5 MG capsule Take 1 capsule by mouth once daily 08/27/19   Gifford Shave, MD  rosuvastatin (CRESTOR) 10 MG tablet TAKE 1 TABLET BY MOUTH THREE TIMES A WEEK 09/27/19   Gifford Shave, MD    Family History Family History  Problem Relation Age of Onset  . CAD Mother 49       CABG  . Stroke Father   .  Heart attack Brother 75  . Colon cancer Neg Hx   . Esophageal cancer Neg Hx   . Rectal cancer Neg Hx   . Stomach cancer Neg Hx     Social History Social History   Tobacco Use  . Smoking status: Never Smoker  . Smokeless tobacco: Never Used  Substance Use Topics  . Alcohol use: No  . Drug use: No     Allergies   Patient has no known allergies.   Review of Systems Review of Systems  Constitutional: Negative for fatigue and fever.  HENT: Negative for ear pain, sinus pain, sore throat and voice change.   Eyes: Negative for pain, redness and visual disturbance.  Respiratory: Negative for cough and shortness of breath.   Cardiovascular: Negative for chest pain, palpitations and leg swelling.  Gastrointestinal: Negative for abdominal pain, diarrhea and vomiting.  Genitourinary: Negative for decreased urine volume and flank pain.  Musculoskeletal: Negative for arthralgias and myalgias.  Skin: Negative for rash and wound.  Neurological: Negative for syncope and headaches.     Physical Exam Triage Vital Signs ED Triage Vitals  Enc Vitals Group     BP 12/27/19 1713 (!) 198/101     Pulse Rate 12/27/19 1713 90     Resp 12/27/19 1713 19     Temp 12/27/19 1713 98.2 F (36.8 C)     Temp Source 12/27/19 1713 Oral     SpO2 12/27/19 1713 99 %     Weight --      Height --      Head Circumference --      Peak Flow --      Pain Score 12/27/19 1712 0     Pain Loc --      Pain Edu? --      Excl. in [place]? --    No data found.  Updated Vital Signs BP (!) 198/101 (BP Location: Left Arm)   Pulse 90   Temp 98.2 F (36.8 C) (Oral)   Resp 19   SpO2 99%   Visual Acuity Right Eye Distance:   Left Eye Distance:   Bilateral Distance:    Right Eye Near:   Left Eye Near:    Bilateral Near:     Physical Exam Constitutional:      General: She is not in acute distress.    Appearance: She is not ill-appearing or diaphoretic.  HENT:     Head: Normocephalic and atraumatic.      Mouth/Throat:     Mouth: Mucous membranes are moist.     Pharynx: Oropharynx is clear. No oropharyngeal exudate or posterior oropharyngeal erythema.  Eyes:     General: No scleral icterus.    Conjunctiva/sclera: Conjunctivae normal.     Pupils: Pupils are equal, round, and reactive to light.  Neck:     Comments: Trachea midline, negative JVD Cardiovascular:     Rate and Rhythm: Normal rate and regular rhythm.     Heart sounds: No murmur heard.  No gallop.   Pulmonary:     Effort: Pulmonary effort is normal. No respiratory distress.     Breath sounds: No wheezing, rhonchi or rales.  Musculoskeletal:     Cervical back: Neck supple. No tenderness.  Lymphadenopathy:     Cervical: No cervical adenopathy.  Skin:    Capillary Refill: Capillary refill takes less than 2 seconds.     Coloration: Skin is not jaundiced or pale.     Findings: No rash.  Neurological:     General: No focal deficit present.     Mental Status: She is alert and oriented to person, place, and time.      UC Treatments / Results  Labs (all labs ordered are listed, but only abnormal results are displayed) Labs Reviewed - No data to display  EKG   Radiology No results found.  Procedures Procedures (including critical care time)  Medications Ordered in UC Medications - No data to display  Initial Impression / Assessment and Plan / UC Course  I have reviewed the triage vital signs and the nursing notes.  Pertinent labs & imaging results that were available during my care of the patient were reviewed by me and considered in my medical decision making (see chart for details).     Hypertensive, though w/o s/s of EOD as per HPI/PE.  Discussed pain, stress, sleep, and tramadol could be contributory.  Will d/c tramadol, monitor BP, try sleep hygiene, and f/u w/ PCP for futher eval.  Return precautions discussed, pt verbalized understanding and is agreeable to plan. Final Clinical Impressions(s) / UC  Diagnoses   Final diagnoses:  Hypertensive urgency     Discharge Instructions     Bring your BP log to your primary care for further evaluation as you may need to be on medications to help prevent headaches. Go to ER for worse headache of life, loss/change of vision, vomiting, fever, ear ringing, dizziness, weakness, facial droop/slurred speech, severe abdominal pain.    ED Prescriptions    None     PDMP not reviewed this encounter.   Neldon Mc Clay, [place] 12/27/19 1913

## 2019-12-27 NOTE — Discharge Instructions (Addendum)
Bring your BP log to your primary care for further evaluation as you may need to be on medications to help prevent headaches. Go to ER for worse headache of life, loss/change of vision, vomiting, fever, ear ringing, dizziness, weakness, facial droop/slurred speech, severe abdominal pain.

## 2019-12-27 NOTE — ED Triage Notes (Signed)
Pt is here with Hypertension that started 2 days ago, pt has taken her prescribed HBP meds as directed.

## 2019-12-28 NOTE — Progress Notes (Signed)
    SUBJECTIVE:   CHIEF COMPLAINT / HPI:   Hypertensive urgency Patient presented to urgent care on 11/22 for elevated blood pressure readings at home, at that time BP was noted to be 198/101 Had just started taking BP at home No signs of endorgan damage at that time Current regimen for blood pressure includes HCTZ 12.5 mg daily, lisinopril 40 mg daily, Lasix 20 mg every other day, she reports compliance Her medicine was decreased by her her last doctor No headaches, changes in vision, chest pain, shortness of breath, changes in urination She has been watching what she eats and trying to avoid salt intake On chart review, had BP 160/96 on 10/6 at gastro, elevated values also in May and February noted Bps at home since going to Urgent Care 181/96 this AM, yesterday evening was 103/61,  152/54, 158/86 Most were elevated, only had one in the normal range  Last CMP on 11/10/2019, creatinine 0.89 at that time  Left Ear Pressure Has been feeling "stopped up in the last few weeks" Otherwise feeling well, no fevers, congestion, cough, otorrhea  PERTINENT  PMH / PSH: HTN, mitral regurg, nonalcoholic hepatic steatosis, T2DM, arthritis, history of ITP, hyperparathyroidism, HLD  OBJECTIVE:   BP (!) 180/100   Pulse 81   Ht 5\' 4"  (1.626 m)   Wt 191 lb (86.6 kg)   SpO2 99%   BMI 32.79 kg/m    Physical Exam:  General: 59 y.o. female in NAD HEENT: PERRL, EOMI, left ear canal blocked by protruding mass that is nonulcerated, right ear canal with impacted cerumen Cardio: RRR Lungs: CTAB, no wheezing, no rhonchi, no crackles, no IWOB on RA Skin: warm and dry Extremities: 5/5 strength BUE/BLE Neuro: Cranial nerves II through XII grossly intact, sensation intact throughout   ASSESSMENT/PLAN:   Essential hypertension It appears that patient has had elevated blood pressure readings at the office for quite some time, however she has reported in previous notes that her blood pressure readings  are normal at home.  Her blood pressure remains elevated today 180/100, even in the setting of whitecoat hypertension, she likely has chronic hypertension at home as well that is not well controlled.  She does not appear to have any signs of endorgan damage on examination today and is neurologically intact.  She is also asymptomatic.  This also wants to a more chronic issue.  We will go ahead and check a BMP today to ensure that kidney function is stable.  We will also increase her hydrochlorothiazide to 25 mg daily and continue lisinopril 40 mg daily.  She will continue to take a blood pressure log and come back in 2 weeks.  She was advised to return to care if she develops changes in vision, new worsening headache, chest pain, difficulty breathing.  Ear canal mass, left Patient has a history of a left ear mass removal in 2015, possibly a cholesteatoma, cannot find clearly in her records.  She reports that she was seen by [facility] previously.  She appears to have a recurrence of this mass on examination today which is likely contributing to the sensation of fullness.  She will be referred back to ENT.  Diabetes ([place]) A1c today 7.4, up from 6.9.  She is less than 7.5, therefore would not make any changes at this time.  Follow-up with PCP.     Cleophas Dunker, [place]

## 2019-12-29 ENCOUNTER — Other Ambulatory Visit: Payer: Self-pay

## 2019-12-29 ENCOUNTER — Ambulatory Visit (INDEPENDENT_AMBULATORY_CARE_PROVIDER_SITE_OTHER): Payer: Medicare Other | Admitting: Family Medicine

## 2019-12-29 ENCOUNTER — Encounter: Payer: Self-pay | Admitting: Family Medicine

## 2019-12-29 VITALS — BP 180/100 | HR 81 | Ht 64.0 in | Wt 191.0 lb

## 2019-12-29 DIAGNOSIS — E11319 Type 2 diabetes mellitus with unspecified diabetic retinopathy without macular edema: Secondary | ICD-10-CM

## 2019-12-29 DIAGNOSIS — I10 Essential (primary) hypertension: Secondary | ICD-10-CM

## 2019-12-29 DIAGNOSIS — H938X2 Other specified disorders of left ear: Secondary | ICD-10-CM

## 2019-12-29 LAB — POCT GLYCOSYLATED HEMOGLOBIN (HGB A1C): Hemoglobin A1C: 7.4 % — AB (ref 4.0–5.6)

## 2019-12-29 MED ORDER — HYDROCHLOROTHIAZIDE 25 MG PO TABS: 25.0000 mg | ORAL_TABLET | Freq: Every day | ORAL | 3 refills | Status: DC

## 2019-12-29 NOTE — Assessment & Plan Note (Addendum)
It appears that patient has had elevated blood pressure readings at the office for quite some time, however she has reported in previous notes that her blood pressure readings are normal at home.  Her blood pressure remains elevated today 180/100, even in the setting of whitecoat hypertension, she likely has chronic hypertension at home as well that is not well controlled.  She does not appear to have any signs of endorgan damage on examination today and is neurologically intact.  She is also asymptomatic.  This also wants to a more chronic issue.  We will go ahead and check a BMP today to ensure that kidney function is stable.  We will also increase her hydrochlorothiazide to 25 mg daily and continue lisinopril 40 mg daily.  She will continue to take a blood pressure log and come back in 2 weeks.  She was advised to return to care if she develops changes in vision, new worsening headache, chest pain, difficulty breathing.

## 2019-12-29 NOTE — Assessment & Plan Note (Signed)
A1c today 7.4, up from 6.9.  She is less than 7.5, therefore would not make any changes at this time.  Follow-up with PCP.

## 2019-12-29 NOTE — Assessment & Plan Note (Signed)
>>  ASSESSMENT AND PLAN FOR DM (DIABETES MELLITUS) WITH COMPLICATIONS ([facility]) WRITTEN ON 12/29/2019  8:58 AM BY MECCARIELLO, BAILEY J, DO  A1c today 7.4, up from 6.9.  She is less than 7.5, therefore would not make any changes at this time.  Follow-up with PCP.

## 2019-12-29 NOTE — Assessment & Plan Note (Addendum)
Patient has a history of a left ear mass removal in 2015, possibly a cholesteatoma, cannot find clearly in her records.  She reports that she was seen by [facility] ENT previously.  She appears to have a recurrence of this mass on examination today which is likely contributing to the sensation of fullness.  She will be referred back to ENT.

## 2019-12-29 NOTE — Patient Instructions (Signed)
Thank you for coming to see me today. It was a pleasure. Today we talked about:   We will get some labs today.  If they are abnormal or we need to do something about them, I will call you.  If they are normal, I will send you a message on MyChart (if it is active) or a letter in the mail.  If you don't hear from [place] in 2 weeks, please call the office at the number below.  We will increase the hydrochlorothiazide to 25mg  from 12.5mg .  Take everything else the same.  Please follow-up with me or PCP in 2 weeks.  Bring your blood pressure log with you.  If you have any questions or concerns, please do not hesitate to call the office at [phone].  Best,   [place] Constable, DO

## 2019-12-30 LAB — BASIC METABOLIC PANEL
BUN/Creatinine Ratio: 16 (ref 9–23)
BUN: 14 mg/dL (ref 6–24)
CO2: 27 mmol/L (ref 20–29)
Calcium: 10.1 mg/dL (ref 8.7–10.2)
Chloride: 101 mmol/L (ref 96–106)
Creatinine, Ser: 0.87 mg/dL (ref 0.57–1.00)
GFR calc Af Amer: 84 mL/min/{1.73_m2} (ref >59)
GFR calc non Af Amer: 73 mL/min/{1.73_m2} (ref >59)
Glucose: 159 mg/dL — ABNORMAL HIGH (ref 65–99)
Potassium: 4 mmol/L (ref 3.5–5.2)
Sodium: 142 mmol/L (ref 134–144)

## 2020-01-04 ENCOUNTER — Ambulatory Visit: Payer: Medicare Other | Admitting: Family Medicine

## 2020-01-14 ENCOUNTER — Encounter: Payer: Self-pay | Admitting: Family Medicine

## 2020-01-14 ENCOUNTER — Other Ambulatory Visit: Payer: Self-pay

## 2020-01-14 ENCOUNTER — Ambulatory Visit (INDEPENDENT_AMBULATORY_CARE_PROVIDER_SITE_OTHER): Payer: Medicare Other | Admitting: Family Medicine

## 2020-01-14 VITALS — BP 128/84 | HR 84 | Ht 63.0 in | Wt 188.4 lb

## 2020-01-14 DIAGNOSIS — I10 Essential (primary) hypertension: Secondary | ICD-10-CM | POA: Diagnosis not present

## 2020-01-14 DIAGNOSIS — E785 Hyperlipidemia, unspecified: Secondary | ICD-10-CM | POA: Diagnosis not present

## 2020-01-14 DIAGNOSIS — E11319 Type 2 diabetes mellitus with unspecified diabetic retinopathy without macular edema: Secondary | ICD-10-CM

## 2020-01-14 NOTE — Assessment & Plan Note (Signed)
Patient had recent hemoglobin A1c on 12/29/2019 which was 7.4 increased from 6.9.  Diabetic foot exam not completed at that time. -We will follow-up with repeat hemoglobin A1c in 2-1/2 months -Diabetic foot exam completed today

## 2020-01-14 NOTE — Assessment & Plan Note (Signed)
Blood pressures have been well controlled since making adjustments with blood pressure medications including increasing hydrochlorothiazide to 25 mg daily and continuing her lisinopril at 40 mg daily.  Blood pressure today well controlled at 128/84. -Continue current blood pressure regimen -Recheck BMP today given change in blood pressure medications -Strict return precautions given including change in vision, worsening headaches, chest pain, difficulty breathing -Follow-up as needed

## 2020-01-14 NOTE — Patient Instructions (Signed)
It was a pleasure seeing you today!  Your blood pressure is doing much better.  I am going to recheck your kidney function just because of the adjustments made on your blood pressure medications at the last visit.  I am also going to check your cholesterol at today's visit.  I will post these results to your MyChart and if there are any abnormalities I will call you.  If you have any issues, questions, concerns please feel free to call the clinic.  I hope you have a wonderful afternoon and a Merry Christmas!

## 2020-01-14 NOTE — Assessment & Plan Note (Signed)
>>  ASSESSMENT AND PLAN FOR DM (DIABETES MELLITUS) WITH COMPLICATIONS ([facility]) WRITTEN ON 01/14/2020  1:49 PM BY CRESENZO, VICTOR, MD  Patient had recent hemoglobin A1c on 12/29/2019 which was 7.4 increased from 6.9.  Diabetic foot exam not completed at that time. -We will follow-up with repeat hemoglobin A1c in 2-1/2 months -Diabetic foot exam completed today

## 2020-01-14 NOTE — Progress Notes (Signed)
    SUBJECTIVE:   CHIEF COMPLAINT / HPI:   HTN  Patient reports that since changing her blood pressure medications her blood pressures have been much better controlled.  Denies any signs of hypertensive urgency.  Diabetes  Patient feels that her diabetes is doing well and is happy with her blood sugars.  At last check was 6.9.  Denies any signs or symptoms of hypoglycemia.  PERTINENT  PMH / PSH: Hypertension, diabetes  OBJECTIVE:   BP 128/84   Pulse 84   Ht 5\' 3"  (1.6 m)   Wt 188 lb 6.4 oz (85.5 kg)   SpO2 98%   BMI 33.37 kg/m   Normal: Well-appearing, no acute distress 59 year old female Cardiac: Regular rate and rhythm, no murmurs appreciated Respiratory: Normal work of breathing, lungs clear to auscultation bilaterally Abdomen: Soft, nontender, positive bowel sounds Extremities: No edema noted  Diabetic foot exam was performed with the following findings:   No deformities, ulcerations, or other skin breakdown Normal sensation of 10g monofilament Intact posterior tibialis and dorsalis pedis pulses      ASSESSMENT/PLAN:   Essential hypertension Blood pressures have been well controlled since making adjustments with blood pressure medications including increasing hydrochlorothiazide to 25 mg daily and continuing her lisinopril at 40 mg daily.  Blood pressure today well controlled at 128/84. -Continue current blood pressure regimen -Recheck BMP today given change in blood pressure medications -Strict return precautions given including change in vision, worsening headaches, chest pain, difficulty breathing -Follow-up as needed   Diabetes [facility]) Patient had recent hemoglobin A1c on 12/29/2019 which was 7.4 increased from 6.9.  Diabetic foot exam not completed at that time. -We will follow-up with repeat hemoglobin A1c in 2-1/2 months -Diabetic foot exam completed today     Gifford Shave, MD [place]

## 2020-01-15 LAB — BASIC METABOLIC PANEL
BUN/Creatinine Ratio: 12 (ref 9–23)
BUN: 10 mg/dL (ref 6–24)
CO2: 24 mmol/L (ref 20–29)
Calcium: 9.8 mg/dL (ref 8.7–10.2)
Chloride: 97 mmol/L (ref 96–106)
Creatinine, Ser: 0.85 mg/dL (ref 0.57–1.00)
GFR calc Af Amer: 87 mL/min/{1.73_m2} (ref >59)
GFR calc non Af Amer: 75 mL/min/{1.73_m2} (ref >59)
Glucose: 150 mg/dL — ABNORMAL HIGH (ref 65–99)
Potassium: 3.8 mmol/L (ref 3.5–5.2)
Sodium: 140 mmol/L (ref 134–144)

## 2020-01-15 LAB — LIPID PANEL
Chol/HDL Ratio: 2.5 ratio (ref 0.0–4.4)
Cholesterol, Total: 179 mg/dL (ref 100–199)
HDL: 71 mg/dL (ref >39)
LDL Chol Calc (NIH): 86 mg/dL (ref 0–99)
Triglycerides: 129 mg/dL (ref 0–149)
VLDL Cholesterol Cal: 22 mg/dL (ref 5–40)

## 2020-01-18 ENCOUNTER — Other Ambulatory Visit: Payer: Self-pay | Admitting: Family Medicine

## 2020-01-18 DIAGNOSIS — E785 Hyperlipidemia, unspecified: Secondary | ICD-10-CM

## 2020-01-19 DIAGNOSIS — D164 Benign neoplasm of bones of skull and face: Secondary | ICD-10-CM | POA: Diagnosis not present

## 2020-01-19 DIAGNOSIS — H6122 Impacted cerumen, left ear: Secondary | ICD-10-CM | POA: Diagnosis not present

## 2020-01-20 ENCOUNTER — Other Ambulatory Visit: Payer: Self-pay | Admitting: Family Medicine

## 2020-02-09 ENCOUNTER — Ambulatory Visit: Payer: Medicare Other | Admitting: Family Medicine

## 2020-02-18 ENCOUNTER — Other Ambulatory Visit: Payer: Self-pay | Admitting: Family Medicine

## 2020-02-18 DIAGNOSIS — I10 Essential (primary) hypertension: Secondary | ICD-10-CM

## 2020-02-21 ENCOUNTER — Other Ambulatory Visit: Payer: Self-pay | Admitting: Family Medicine

## 2020-02-21 DIAGNOSIS — E785 Hyperlipidemia, unspecified: Secondary | ICD-10-CM

## 2020-03-02 ENCOUNTER — Telehealth: Payer: Self-pay | Admitting: *Deleted

## 2020-03-02 DIAGNOSIS — I10 Essential (primary) hypertension: Secondary | ICD-10-CM

## 2020-03-02 DIAGNOSIS — E11319 Type 2 diabetes mellitus with unspecified diabetic retinopathy without macular edema: Secondary | ICD-10-CM

## 2020-03-02 DIAGNOSIS — E785 Hyperlipidemia, unspecified: Secondary | ICD-10-CM

## 2020-03-02 NOTE — Telephone Encounter (Signed)
Received a request from [facility] for pts rosuvastatin to be changed to a 90 day supply. Katryna Tschirhart Zimmerman Rumple, CMA

## 2020-03-06 ENCOUNTER — Other Ambulatory Visit: Payer: Self-pay | Admitting: Cardiology

## 2020-03-06 DIAGNOSIS — I10 Essential (primary) hypertension: Secondary | ICD-10-CM

## 2020-03-08 ENCOUNTER — Other Ambulatory Visit: Payer: Self-pay | Admitting: Cardiology

## 2020-03-08 DIAGNOSIS — I10 Essential (primary) hypertension: Secondary | ICD-10-CM

## 2020-03-10 MED ORDER — CARVEDILOL 12.5 MG PO TABS
ORAL_TABLET | ORAL | 3 refills | Status: DC
Start: 2020-03-10 — End: 2020-05-25

## 2020-03-10 MED ORDER — ROSUVASTATIN CALCIUM 10 MG PO TABS
ORAL_TABLET | ORAL | 3 refills | Status: DC
Start: 2020-03-10 — End: 2021-01-08

## 2020-03-10 MED ORDER — METFORMIN HCL ER 500 MG PO TB24: ORAL_TABLET | ORAL | 3 refills | Status: DC

## 2020-03-10 MED ORDER — LISINOPRIL 40 MG PO TABS
40.0000 mg | ORAL_TABLET | Freq: Every day | ORAL | 3 refills | Status: DC
Start: 2020-03-10 — End: 2020-05-11

## 2020-03-10 NOTE — Telephone Encounter (Signed)
Called patient regarding her prescription refills needed.  Refill the prescriptions requested.  Sent to [facility] on [address] with refills.

## 2020-03-10 NOTE — Addendum Note (Signed)
Addended by: Concepcion Living on: 03/10/2020 02:06 PM   Modules accepted: Orders

## 2020-03-23 ENCOUNTER — Other Ambulatory Visit: Payer: Self-pay | Admitting: Family Medicine

## 2020-04-28 ENCOUNTER — Ambulatory Visit
Admission: EM | Admit: 2020-04-28 | Discharge: 2020-04-28 | Disposition: A | Discharge status: Home / Self Care | Payer: Medicare Other

## 2020-04-28 ENCOUNTER — Other Ambulatory Visit: Payer: Self-pay

## 2020-04-28 DIAGNOSIS — R200 Anesthesia of skin: Secondary | ICD-10-CM | POA: Diagnosis not present

## 2020-04-28 NOTE — Discharge Instructions (Signed)
Go to the emergency department if you have acute numbness or other concerning symptoms.    Schedule a visit with your primary care provider or ENT soon as possible.

## 2020-04-28 NOTE — ED Provider Notes (Signed)
[facility]    CSN: [id number] Arrival date & time: 04/28/20  1049      History   Chief Complaint Chief Complaint  Patient presents with  . Numbness    HPI [name] is a 60 y.o. female.   Patient presents with 1 week history of a sensation on the tip of her tongue and roof of her mouth which she describes as "not quite numbness but a sensation."  She denies pain.  She states she had cold symptoms 1 week ago but these have resolved.  She denies fever, chills, difficulty swallowing, ear pain, sore throat, or other symptoms.  No treatments attempted at home.  Her medical history includes hypertension, diabetes, breast cancer.  The history is provided by the patient and medical records.    Past Medical History:  Diagnosis Date  . Arthritis   . Breast CA ([place])    breast CA- right breast  . Diabetes mellitus   . Hyperlipidemia   . Hypertension   . Thrombocytopenia ([place])   . Thyroid disease    parathyroid removed    Patient Active Problem List   Diagnosis Date Noted  . Ear canal mass, left 12/29/2019  . Dysuria 04/01/2019  . Disability examination 04/01/2019  . Nonalcoholic hepatosteatosis 07/28/7626  . Abnormal urine odor 09/23/2018  . Hepatic lesion 08/11/2018  . Generalized edema 07/14/2018  . Need for immunization against influenza 12/04/2017  . Arthritis of both hands 04/03/2016  . Obesity 08/30/2015  . 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 Cataracts, bilateral 09/13/2014  . Glaucoma 09/13/2014  . Mitral regurgitation 02/18/2014  . Hip pain 01/07/2014  . Transaminitis 11/13/2012  . Hyperlipidemia 04/04/2011  . Pedal edema 04/04/2011  . Diabetes ([place]) 06/07/2010  . Cancer of central portion of right female breast ([place]) 10/04/2009  . Idiopathic thrombocytopenic purpura (ITP) ([place]) 06/22/2009  . Essential hypertension 06/22/2009  . HYPERPARATHYROIDISM, HX OF 06/22/2009  . Avitaminosis D 09/01/2008    Past Surgical History:   Procedure Laterality Date  . ABDOMINAL HYSTERECTOMY     1996 per pt  . BREAST BIOPSY Right 06/2009  . BREAST SURGERY  09/12/10   excision of skin mass - right breast, had another surgery to remove more Cancer cells  . INNER EAR SURGERY  06/01/13  . MASTECTOMY Right 07/21/2009  . PARATHYROIDECTOMY     partial?  . REDUCTION MAMMAPLASTY Left 2013    OB History   No obstetric history on file.      Home Medications    Prior to Admission medications   Medication Sig Start Date End Date Taking? Authorizing Provider  Blood Glucose Monitoring Suppl (ACCU-CHEK AVIVA PLUS) w/Device KIT Use to test blood sugar daily 09/09/18   Zenia Resides, MD  carvedilol (COREG) 12.5 MG tablet Take 1 tablet by mouth twice daily with meals 03/10/20   Gifford Shave, MD  furosemide (LASIX) 20 MG tablet TAKE 1 TABLET BY MOUTH EVERY OTHER DAY 03/08/20   Minus Breeding, MD  glucose blood (ACCU-CHEK ACTIVE STRIPS) test strip Use to test blood sugar daily 09/09/18   Zenia Resides, MD  hydrochlorothiazide (HYDRODIURIL) 25 MG tablet Take 1 tablet (25 mg total) by mouth daily. 12/29/19   Meccariello, Bernita Raisin, DO  Lancet Devices (ONE TOUCH DELICA LANCING DEV) MISC Use to test blood sugar once daily.  Dx Code: E11.9 09/09/18   Zenia Resides, MD  Lancets Misc. MISC Use as directed to check blood sugar 12/15/18   Nori Riis,  Marzetta Merino, MD  lisinopril (ZESTRIL) 40 MG tablet Take 1 tablet (40 mg total) by mouth daily. 03/10/20   Gifford Shave, MD  metFORMIN (GLUCOPHAGE-XR) 500 MG 24 hr tablet Take 1 tablet by mouth once daily with breakfast 03/10/20   Gifford Shave, MD  rosuvastatin (CRESTOR) 10 MG tablet Take 3 times weekly 03/10/20   Gifford Shave, MD  traMADol Veatrice Bourbon) 50 MG tablet Take 1 tablet by mouth twice daily as needed 03/24/20   Gifford Shave, MD    Family History Family History  Problem Relation Age of Onset  . CAD Mother 26       CABG  . Stroke Father   . Heart attack Brother 52  . Colon cancer Neg Hx    . Esophageal cancer Neg Hx   . Rectal cancer Neg Hx   . Stomach cancer Neg Hx     Social History Social History   Tobacco Use  . Smoking status: Never Smoker  . Smokeless tobacco: Never Used  Substance Use Topics  . Alcohol use: No  . Drug use: No     Allergies   Patient has no known allergies.   Review of Systems Review of Systems  Constitutional: Negative for chills and fever.  HENT: Negative for ear pain, sore throat, trouble swallowing and voice change.   Eyes: Negative for pain and visual disturbance.  Respiratory: Negative for cough and shortness of breath.   Cardiovascular: Negative for chest pain and palpitations.  Gastrointestinal: Negative for abdominal pain and vomiting.  Genitourinary: Negative for dysuria and hematuria.  Musculoskeletal: Negative for arthralgias and back pain.  Skin: Negative for color change and rash.  Neurological: Negative for seizures and syncope.  All other systems reviewed and are negative.    Physical Exam Triage Vital Signs ED Triage Vitals  Enc Vitals Group     BP      Pulse      Resp      Temp      Temp src      SpO2      Weight      Height      Head Circumference      Peak Flow      Pain Score      Pain Loc      Pain Edu?      Excl. in [place]?    No data found.  Updated Vital Signs BP (!) 147/83   Pulse 71   Temp 98.4 F (36.9 C)   Resp 19   SpO2 94%   Visual Acuity Right Eye Distance:   Left Eye Distance:   Bilateral Distance:    Right Eye Near:   Left Eye Near:    Bilateral Near:     Physical Exam Vitals and nursing note reviewed.  Constitutional:      General: She is not in acute distress.    Appearance: She is well-developed. She is not ill-appearing.  HENT:     Head: Normocephalic and atraumatic.     Right Ear: Tympanic membrane normal.     Left Ear: Tympanic membrane normal.     Nose: Nose normal.     Mouth/Throat:     Mouth: Mucous membranes are moist. No oral lesions.     Pharynx:  Oropharynx is clear.  Eyes:     Conjunctiva/sclera: Conjunctivae normal.  Cardiovascular:     Rate and Rhythm: Normal rate and regular rhythm.     Heart sounds: Normal heart sounds.  Pulmonary:  Effort: Pulmonary effort is normal. No respiratory distress.     Breath sounds: Normal breath sounds.  Abdominal:     Palpations: Abdomen is soft.     Tenderness: There is no abdominal tenderness.  Musculoskeletal:     Cervical back: Neck supple.  Skin:    General: Skin is warm and dry.     Findings: No bruising, erythema, lesion or rash.  Neurological:     General: No focal deficit present.     Mental Status: She is alert and oriented to person, place, and time.     Cranial Nerves: No cranial nerve deficit.     Sensory: No sensory deficit.     Motor: No weakness.     Coordination: Coordination normal.     Gait: Gait normal.  Psychiatric:        Mood and Affect: Mood normal.        Behavior: Behavior normal.      UC Treatments / Results  Labs (all labs ordered are listed, but only abnormal results are displayed) Labs Reviewed - No data to display  EKG   Radiology No results found.  Procedures Procedures (including critical care time)  Medications Ordered in UC Medications - No data to display  Initial Impression / Assessment and Plan / UC Course  I have reviewed the triage vital signs and the nursing notes.  Pertinent labs & imaging results that were available during my care of the patient were reviewed by me and considered in my medical decision making (see chart for details).   Subjective numbness of tongue and roof of mouth.  Patient is well-appearing and her exam is reassuring.  Neurologically intact.  ED precautions discussed.  Instructed her to follow-up with her PCP or ENT if her symptoms are not improving.  She agrees to plan of care.   Final Clinical Impressions(s) / UC Diagnoses   Final diagnoses:  Numbness of tongue     Discharge Instructions      Go to the emergency department if you have acute numbness or other concerning symptoms.    Schedule a visit with your primary care provider or ENT soon as possible.      ED Prescriptions    None     I have reviewed the PDMP during this encounter.   Sharion Balloon, NP 04/28/20 1126

## 2020-04-28 NOTE — ED Triage Notes (Signed)
Pt presents with complaints of numbness to the tip of her tongue and roof of her mouth x 3 days. Reports it feels funny it does not hurt. States she did have an ear ache and runny nose and is not sure if that is contributing. Denies any other symptoms.

## 2020-04-29 ENCOUNTER — Other Ambulatory Visit: Payer: Self-pay | Admitting: Cardiology

## 2020-04-29 DIAGNOSIS — I10 Essential (primary) hypertension: Secondary | ICD-10-CM

## 2020-05-01 DIAGNOSIS — Z20822 Contact with and (suspected) exposure to covid-19: Secondary | ICD-10-CM | POA: Diagnosis not present

## 2020-05-02 ENCOUNTER — Other Ambulatory Visit: Payer: Medicare Other

## 2020-05-11 ENCOUNTER — Other Ambulatory Visit: Payer: Self-pay | Admitting: Family Medicine

## 2020-05-11 DIAGNOSIS — I10 Essential (primary) hypertension: Secondary | ICD-10-CM

## 2020-05-12 ENCOUNTER — Other Ambulatory Visit: Payer: Self-pay | Admitting: Family Medicine

## 2020-05-12 DIAGNOSIS — Z1231 Encounter for screening mammogram for malignant neoplasm of breast: Secondary | ICD-10-CM

## 2020-05-22 ENCOUNTER — Encounter (HOSPITAL_COMMUNITY): Payer: Self-pay | Admitting: *Deleted

## 2020-05-22 ENCOUNTER — Other Ambulatory Visit: Payer: Self-pay

## 2020-05-22 ENCOUNTER — Ambulatory Visit (HOSPITAL_COMMUNITY)
Admission: EM | Admit: 2020-05-22 | Discharge: 2020-05-22 | Disposition: A | Discharge status: Home / Self Care | Payer: Medicare Other | Attending: Emergency Medicine | Admitting: Emergency Medicine

## 2020-05-22 DIAGNOSIS — H6122 Impacted cerumen, left ear: Secondary | ICD-10-CM | POA: Diagnosis not present

## 2020-05-22 DIAGNOSIS — B370 Candidal stomatitis: Secondary | ICD-10-CM | POA: Diagnosis not present

## 2020-05-22 MED ORDER — CLOTRIMAZOLE 10 MG MT TROC: 10.0000 mg | Freq: Every day | OROMUCOSAL | 0 refills | Status: DC

## 2020-05-22 MED ORDER — CLOTRIMAZOLE 10 MG MT TROC: 10.0000 mg | Freq: Every day | OROMUCOSAL | Status: DC

## 2020-05-22 NOTE — ED Provider Notes (Addendum)
[facility]    CSN: [id number] Arrival date & time: 05/22/20  5784      History   Chief Complaint No chief complaint on file.   HPI [name] is a 60 y.o. female.   Patient presents with [name] coating over tongue throughout day and dry mouth for 1 week. Using toothbrush to scrub tongue while cleaning. Brushes teeth twice daily. Endorses intermittent bilateral ear fullness and congestion. Denies difficulty swallowing, loss of taste, fever, chills, sore throat. Denies recent antibiotic use, History of diabetes, controlled, last A1C below & in 01/22.  Past Medical History:  Diagnosis Date  . Arthritis   . Breast CA ([place])    breast CA- right breast  . Diabetes mellitus   . Hyperlipidemia   . Hypertension   . Thrombocytopenia ([place])   . Thyroid disease    parathyroid removed    Patient Active Problem List   Diagnosis Date Noted  . Ear canal mass, left 12/29/2019  . Dysuria 04/01/2019  . Disability examination 04/01/2019  . Nonalcoholic hepatosteatosis 69/62/9528  . Abnormal urine odor 09/23/2018  . Hepatic lesion 08/11/2018  . Generalized edema 07/14/2018  . Need for immunization against influenza 12/04/2017  . Arthritis of both hands 04/03/2016  . Obesity 08/30/2015  . 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 Cataracts, bilateral 09/13/2014  . Glaucoma 09/13/2014  . Mitral regurgitation 02/18/2014  . Hip pain 01/07/2014  . Transaminitis 11/13/2012  . Hyperlipidemia 04/04/2011  . Pedal edema 04/04/2011  . Diabetes ([place]) 06/07/2010  . Cancer of central portion of right female breast ([place]) 10/04/2009  . Idiopathic thrombocytopenic purpura (ITP) ([place]) 06/22/2009  . Essential hypertension 06/22/2009  . HYPERPARATHYROIDISM, HX OF 06/22/2009  . Avitaminosis D 09/01/2008    Past Surgical History:  Procedure Laterality Date  . ABDOMINAL HYSTERECTOMY     1996 per pt  . BREAST BIOPSY Right 06/2009  . BREAST SURGERY  09/12/10   excision of skin  mass - right breast, had another surgery to remove more Cancer cells  . INNER EAR SURGERY  06/01/13  . MASTECTOMY Right 07/21/2009  . PARATHYROIDECTOMY     partial?  . REDUCTION MAMMAPLASTY Left 2013    OB History   No obstetric history on file.      Home Medications    Prior to Admission medications   Medication Sig Start Date End Date Taking? Authorizing Provider  clotrimazole (MYCELEX) 10 MG troche Take 1 tablet (10 mg total) by mouth 5 (five) times daily. 05/22/20  Yes Syncere Eble, Leitha Schuller, NP  Blood Glucose Monitoring Suppl (ACCU-CHEK AVIVA PLUS) w/Device KIT Use to test blood sugar daily 09/09/18   Zenia Resides, MD  carvedilol (COREG) 12.5 MG tablet Take 1 tablet by mouth twice daily with meals 03/10/20   Gifford Shave, MD  furosemide (LASIX) 20 MG tablet TAKE 1 TABLET BY MOUTH EVERY OTHER DAY 05/01/20   Minus Breeding, MD  glucose blood (ACCU-CHEK ACTIVE STRIPS) test strip Use to test blood sugar daily 09/09/18   Zenia Resides, MD  hydrochlorothiazide (HYDRODIURIL) 25 MG tablet Take 1 tablet (25 mg total) by mouth daily. 12/29/19   Meccariello, Bernita Raisin, DO  Lancet Devices (ONE TOUCH DELICA LANCING DEV) MISC Use to test blood sugar once daily.  Dx Code: E11.9 09/09/18   Zenia Resides, MD  Lancets Misc. MISC Use as directed to check blood sugar 12/15/18   Dickie La, MD  lisinopril (ZESTRIL) 40 MG tablet Take 1 tablet by  mouth once daily 05/11/20   Gifford Shave, MD  metFORMIN (GLUCOPHAGE-XR) 500 MG 24 hr tablet Take 1 tablet by mouth once daily with breakfast 03/10/20   Gifford Shave, MD  rosuvastatin (CRESTOR) 10 MG tablet Take 3 times weekly 03/10/20   Gifford Shave, MD  traMADol Veatrice Bourbon) 50 MG tablet Take 1 tablet by mouth twice daily as needed 03/24/20   Gifford Shave, MD    Family History Family History  Problem Relation Age of Onset  . CAD Mother 26       CABG  . Stroke Father   . Heart attack Brother 100  . Colon cancer Neg Hx   . Esophageal cancer Neg  Hx   . Rectal cancer Neg Hx   . Stomach cancer Neg Hx     Social History Social History   Tobacco Use  . Smoking status: Never Smoker  . Smokeless tobacco: Never Used  Substance Use Topics  . Alcohol use: No  . Drug use: No     Allergies   Patient has no known allergies.   Review of Systems Review of Systems  Constitutional: Negative.   HENT: Negative.   Respiratory: Negative.   Cardiovascular: Negative.   Neurological: Negative.      Physical Exam Triage Vital Signs ED Triage Vitals  Enc Vitals Group     BP 05/22/20 0913 (!) 163/73     Pulse Rate 05/22/20 0913 84     Resp 05/22/20 0913 18     Temp 05/22/20 0913 98.9 F (37.2 C)     Temp Source 05/22/20 0913 Oral     SpO2 05/22/20 0913 97 %     Weight --      Height --      Head Circumference --      Peak Flow --      Pain Score 05/22/20 0920 0     Pain Loc --      Pain Edu? --      Excl. in [place]? --    No data found.  Updated Vital Signs BP (!) 163/73 (BP Location: Right Arm)   Pulse 84   Temp 98.9 F (37.2 C) (Oral)   Resp 18   SpO2 97%   Visual Acuity Right Eye Distance:   Left Eye Distance:   Bilateral Distance:    Right Eye Near:   Left Eye Near:    Bilateral Near:     Physical Exam Constitutional:      Appearance: Normal appearance. She is normal weight.  HENT:     Head: Normocephalic.     Right Ear: Tympanic membrane, ear canal and external ear normal.     Left Ear: There is impacted cerumen.     Nose: Nose normal.     Mouth/Throat:     Mouth: Mucous membranes are moist.     Comments: Thin coating of thrush over tongue  Eyes:     Extraocular Movements: Extraocular movements intact.     Conjunctiva/sclera: Conjunctivae normal.     Pupils: Pupils are equal, round, and reactive to light.  Cardiovascular:     Rate and Rhythm: Normal rate and regular rhythm.     Pulses: Normal pulses.     Heart sounds: Normal heart sounds.  Pulmonary:     Effort: Pulmonary effort is normal.   Musculoskeletal:        General: Normal range of motion.     Cervical back: Normal range of motion.  Skin:    General:  Skin is warm and dry.  Neurological:     General: No focal deficit present.     Mental Status: She is alert and oriented to person, place, and time. Mental status is at baseline.  Psychiatric:        Mood and Affect: Mood normal.        Behavior: Behavior normal.        Thought Content: Thought content normal.        Judgment: Judgment normal.      UC Treatments / Results  Labs (all labs ordered are listed, but only abnormal results are displayed) Labs Reviewed - No data to display  EKG   Radiology No results found.  Procedures Procedures (including critical care time)  Medications Ordered in UC Medications - No data to display  Initial Impression / Assessment and Plan / UC Course  I have reviewed the triage vital signs and the nursing notes.  Pertinent labs & imaging results that were available during my care of the patient were reviewed by me and considered in my medical decision making (see chart for details).  Oral candida Impacted cerumen left ear  1. Clotrimazole 5x daily for 7 days, follow with PCP if symptoms still present after completion of medication 2. Ear wash with irrigation, TM, canal normal, feeling of fullness resolved after cleaning Final Clinical Impressions(s) / UC Diagnoses   Final diagnoses:  Oral candida  Impacted cerumen, left ear     Discharge Instructions     Use medication five times a day for the next 7 days, let medication dissolve in mouth,  Continue oral hygiene of brushing teeth and tongue twice daily and flossing  Follow up with primary doctor if symptom still present after completion of medication    ED Prescriptions    Medication Sig Dispense Auth. Provider   clotrimazole (MYCELEX) 10 MG troche Take 1 tablet (10 mg total) by mouth 5 (five) times daily. 35 Troche Shirl Weir, Leitha Schuller, NP     PDMP not  reviewed this encounter.   Hans Eden, NP 05/22/20 1040    Lowella Petties R, NP 05/22/20 1041

## 2020-05-22 NOTE — Discharge Instructions (Addendum)
Use medication five times a day for the next 7 days, let medication dissolve in mouth,  Continue oral hygiene of brushing teeth and tongue twice daily and flossing  Follow up with primary doctor if symptom still present after completion of medication

## 2020-05-22 NOTE — ED Triage Notes (Signed)
Pt presents with a white coating on her  tongue.

## 2020-05-24 ENCOUNTER — Other Ambulatory Visit: Payer: Self-pay | Admitting: Family Medicine

## 2020-05-24 DIAGNOSIS — I10 Essential (primary) hypertension: Secondary | ICD-10-CM

## 2020-05-25 NOTE — Progress Notes (Signed)
    SUBJECTIVE:   CHIEF COMPLAINT / HPI:   Oral candidiasis  White plaque on her tongue x 1 week.  Seen in ED 05/22/20 and provided clotrimazole. Started, but stopped after about 10 pills as she was confused about the treatment. Never happened before. Does not use inhalers. Reports some polyuria/polydipsia. Reports compliance with her diabetes medications.  She denies any dysphagia, difficulty with swallowing.  PERTINENT  PMH / PSH: Breast cancer (2012), radiation, chemo and off of tamoxifen in October 2021), diabetes, hyperlipidemia, hypertension, thrombocytopenia, status post parathyroidectomy  OBJECTIVE:   BP (!) 138/94   Pulse 74   Ht 5\' 3"  (1.6 m)   Wt 194 lb 3.2 oz (88.1 kg)   SpO2 99%   BMI 34.40 kg/m   General: well appearing female, NAD  Mouth: thin layer of white plaque on tongue. Difficult to appreciated whether plaque is scrapable.    Scraped a sample for microscopy as PE not definitive.   ASSESSMENT/PLAN:   Oral candidiasis Most likely oral candidiasis versus possible glossitis secondary to nutritional deficiencies.  Differential also includes lichen planus, psoriasis, oral hairy leukoplakia.  There were no oral ulcerative lesions or inflammation suggestive of HSV, CMV, syphilis.  KOH slide was indeterminate.  Will treat empirically with clotrimazole 5 times a day x7 days.  We will also check A1c today.  Patient instructed to follow-up for diabetes as she is due for visit.    Wilber Oliphant, MD [place]

## 2020-05-26 ENCOUNTER — Ambulatory Visit (INDEPENDENT_AMBULATORY_CARE_PROVIDER_SITE_OTHER): Payer: Medicare Other | Admitting: Family Medicine

## 2020-05-26 ENCOUNTER — Encounter: Payer: Self-pay | Admitting: Family Medicine

## 2020-05-26 ENCOUNTER — Other Ambulatory Visit: Payer: Self-pay

## 2020-05-26 VITALS — BP 138/94 | HR 74 | Ht 63.0 in | Wt 194.2 lb

## 2020-05-26 DIAGNOSIS — E119 Type 2 diabetes mellitus without complications: Secondary | ICD-10-CM | POA: Diagnosis not present

## 2020-05-26 DIAGNOSIS — B370 Candidal stomatitis: Secondary | ICD-10-CM | POA: Diagnosis not present

## 2020-05-26 LAB — POCT GLYCOSYLATED HEMOGLOBIN (HGB A1C): HbA1c, POC (controlled diabetic range): 8.6 % — AB (ref 0.0–7.0)

## 2020-05-26 MED ORDER — CLOTRIMAZOLE 10 MG MT TROC: 10.0000 mg | Freq: Every day | OROMUCOSAL | 0 refills | Status: DC

## 2020-05-26 NOTE — Patient Instructions (Addendum)
Dear [name],   Thank you for taking your time to come in to be seen. Today, we discussed the following:   Candidiasis    Please take clotrimazole 5 x a day for 7 days. Come back in one week for follow up.   Diabetes    You are due to followup. Please schedule an appointment at your earliest convenience.   For any labs obtained today, I will send results via [place].  If anything is abnormal, we will reach out to you for details on further management. Please bring all of your medications to your next appointment.   Be well,   Dr. Maudie Mercury  *Sign up for MyChart for instant access to your health profile, labs, orders, upcoming appointments or to contact your provider with questions*  =================================================================================== Some important items for [name]'s health:   Please schedule an appointment with your healthcare provider to address the following or for any questions or concerns regarding the information below.   To keep you healthy, you are due for the following Health Maintenance Items:  There are no preventive care reminders to display for this patient.  Your Blood Pressure.  Should be LESS THAN 140/90 BP Readings from Last 3 Encounters:  05/26/20 (!) 138/94  05/22/20 (!) 163/73  04/28/20 (!) 147/83    Your Weight History Wt Readings from Last 3 Encounters:  05/26/20 194 lb 3.2 oz (88.1 kg)  01/14/20 188 lb 6.4 oz (85.5 kg)  12/29/19 191 lb (86.6 kg)    Body mass index is 34.4 kg/m.  BMI Classes Classification BMI Category (kg/m2)  Underweight < 18.5  Normal Weight 18.5-24.9  Overweight  25.0-29.9  Obese Class I 30.0-34.9  Obese Class II 35.0-39.9  Obese Class III  > or  = 40.0

## 2020-05-26 NOTE — Assessment & Plan Note (Signed)
Most likely oral candidiasis versus possible glossitis secondary to nutritional deficiencies.  Differential also includes lichen planus, psoriasis, oral hairy leukoplakia.  There were no oral ulcerative lesions or inflammation suggestive of HSV, CMV, syphilis.  KOH slide was indeterminate.  Will treat empirically with clotrimazole 5 times a day x7 days.  We will also check A1c today.  Patient instructed to follow-up for diabetes as she is due for visit.

## 2020-05-27 LAB — HIV ANTIBODY (ROUTINE TESTING W REFLEX): HIV Screen 4th Generation wRfx: NONREACTIVE

## 2020-05-28 ENCOUNTER — Other Ambulatory Visit: Payer: Self-pay | Admitting: Family Medicine

## 2020-05-28 DIAGNOSIS — E11319 Type 2 diabetes mellitus with unspecified diabetic retinopathy without macular edema: Secondary | ICD-10-CM

## 2020-05-29 MED ORDER — ONETOUCH DELICA LANCING DEV MISC: 0 refills | Status: AC

## 2020-05-29 MED ORDER — ONETOUCH VERIO W/DEVICE KIT: PACK | 0 refills | Status: DC

## 2020-05-29 MED ORDER — ONETOUCH DELICA LANCETS 33G MISC: 3 refills | Status: DC

## 2020-06-01 ENCOUNTER — Ambulatory Visit: Payer: Medicare Other | Admitting: Family Medicine

## 2020-06-13 ENCOUNTER — Ambulatory Visit (INDEPENDENT_AMBULATORY_CARE_PROVIDER_SITE_OTHER): Payer: Medicare Other | Admitting: Family Medicine

## 2020-06-13 ENCOUNTER — Other Ambulatory Visit: Payer: Self-pay

## 2020-06-13 ENCOUNTER — Encounter: Payer: Self-pay | Admitting: Family Medicine

## 2020-06-13 VITALS — BP 140/86 | HR 95 | Ht 63.0 in | Wt 190.8 lb

## 2020-06-13 DIAGNOSIS — E11319 Type 2 diabetes mellitus with unspecified diabetic retinopathy without macular edema: Secondary | ICD-10-CM | POA: Diagnosis not present

## 2020-06-13 DIAGNOSIS — I10 Essential (primary) hypertension: Secondary | ICD-10-CM | POA: Diagnosis not present

## 2020-06-13 DIAGNOSIS — B370 Candidal stomatitis: Secondary | ICD-10-CM | POA: Diagnosis not present

## 2020-06-13 LAB — POCT GLYCOSYLATED HEMOGLOBIN (HGB A1C): HbA1c, POC (controlled diabetic range): 8.3 % — AB (ref 0.0–7.0)

## 2020-06-13 LAB — POCT HEMOGLOBIN: Hemoglobin: 11.6 g/dL (ref 11–14.6)

## 2020-06-13 MED ORDER — METFORMIN HCL ER 500 MG PO TB24: 1000.0000 mg | ORAL_TABLET | Freq: Two times a day (BID) | ORAL | 3 refills | Status: DC

## 2020-06-13 NOTE — Patient Instructions (Signed)
It was great seeing you today.  For your diabetes I want to increase your metformin.  You are currently taking 500 mg daily.  I want you to increase it to taking 500 mg twice daily for 1 week, then start taking 1000 mg (2 pills) in the morning and 500 mg at night for 1 week.  You will then increase to 1000 mg in the morning and 1000 mg at night.  I would like to follow-up in 3 months for a repeat hemoglobin A1c and we can discuss further changes in her diabetic regimen.  Regarding her tongue concerns this is most likely normal variant and not concerning.  If you have any questions or concerns please let me know.  I hope you have a wonderful afternoon!

## 2020-06-13 NOTE — Progress Notes (Signed)
    SUBJECTIVE:   CHIEF COMPLAINT / HPI:   Tongue concerns  Patient reports that she is still concerned about the discoloration in her tongue.  She took 1 week of clotrimazole for 1 week.  She reports that she has had some dry mouth but that she has not noticed any change in the color.  Diabetes Patient had a hemoglobin A1c drawn last week which was 8.6.  Her only diabetes medication is metformin 500 mg daily.  She reports that she has been trying to make changes in her diet to help with her blood sugars but is okay with medication changes.  No signs of hypoglycemia.   OBJECTIVE:   BP 140/86   Pulse 95   Ht 5\' 3"  (1.6 m)   Wt 190 lb 12.8 oz (86.5 kg)   SpO2 96%   BMI 33.80 kg/m   General: Well-appearing 60 year old female, no acute distress HEENT: Patient with dark discoloration on tongue, tongue is dry at this time Respiratory: Normal work of breathing MSK: Ambulates without difficulty     ASSESSMENT/PLAN:   Diabetes ([place]) Patient's most recent A1c is 8.3.  Patient currently on metformin 500 mg daily. - Increase metformin to 2000 mg daily, gave instructions to titrate with increasing to 500 twice daily> 1000 in the morning and 500 at night> 1000 twice daily over the next 3 weeks.  Patient understands this - Follow-up in 3 months for repeat hemoglobin A1c and further medication management  Oral candidiasis Discoloration patient's tongue not significantly improved from previous evaluation.  She reports that it is not hurting her but she is just concerned because of the changes in color.  Most likely just excess melanin deposits and tongue.  Discussed with preceptor and we checked hemoglobin because it could be a sign of iron deficiency anemia.  Hemoglobin was within normal limits.  We will continue to monitor at this time.     Gifford Shave, MD [place]

## 2020-06-14 NOTE — Assessment & Plan Note (Signed)
Discoloration patient's tongue not significantly improved from previous evaluation.  She reports that it is not hurting her but she is just concerned because of the changes in color.  Most likely just excess melanin deposits and tongue.  Discussed with preceptor and we checked hemoglobin because it could be a sign of iron deficiency anemia.  Hemoglobin was within normal limits.  We will continue to monitor at this time.

## 2020-06-14 NOTE — Assessment & Plan Note (Signed)
>>  ASSESSMENT AND PLAN FOR DM (DIABETES MELLITUS) WITH COMPLICATIONS (HCC) WRITTEN ON 06/14/2020  9:28 AM BY CRESENZO, VICTOR, MD  Patient's most recent A1c is 8.3.  Patient currently on metformin  500 mg daily. - Increase metformin  to 2000 mg daily, gave instructions to titrate with increasing to 500 twice daily> 1000 in the morning and 500 at night> 1000 twice daily over the next 3 weeks.  Patient understands this - Follow-up in 3 months for repeat hemoglobin A1c and further medication management

## 2020-06-14 NOTE — Assessment & Plan Note (Signed)
Patient's most recent A1c is 8.3.  Patient currently on metformin 500 mg daily. - Increase metformin to 2000 mg daily, gave instructions to titrate with increasing to 500 twice daily> 1000 in the morning and 500 at night> 1000 twice daily over the next 3 weeks.  Patient understands this - Follow-up in 3 months for repeat hemoglobin A1c and further medication management

## 2020-06-19 ENCOUNTER — Other Ambulatory Visit: Payer: Self-pay | Admitting: Cardiology

## 2020-06-19 DIAGNOSIS — I10 Essential (primary) hypertension: Secondary | ICD-10-CM

## 2020-07-08 ENCOUNTER — Other Ambulatory Visit: Payer: Self-pay | Admitting: Family Medicine

## 2020-07-14 ENCOUNTER — Ambulatory Visit
Admission: RE | Admit: 2020-07-14 | Discharge: 2020-07-14 | Disposition: A | Discharge status: Home / Self Care | Payer: Medicare Other | Source: Ambulatory Visit | Attending: *Deleted | Admitting: *Deleted

## 2020-07-14 ENCOUNTER — Other Ambulatory Visit: Payer: Self-pay

## 2020-07-14 DIAGNOSIS — Z1231 Encounter for screening mammogram for malignant neoplasm of breast: Secondary | ICD-10-CM | POA: Diagnosis not present

## 2020-07-19 ENCOUNTER — Ambulatory Visit: Payer: Medicare Other | Admitting: Family Medicine

## 2020-07-19 DIAGNOSIS — H938X2 Other specified disorders of left ear: Secondary | ICD-10-CM | POA: Diagnosis not present

## 2020-07-19 DIAGNOSIS — D164 Benign neoplasm of bones of skull and face: Secondary | ICD-10-CM | POA: Diagnosis not present

## 2020-07-26 ENCOUNTER — Ambulatory Visit: Payer: Medicare Other | Admitting: Family Medicine

## 2020-08-04 ENCOUNTER — Emergency Department (HOSPITAL_COMMUNITY)
Admission: EM | Admit: 2020-08-04 | Discharge: 2020-08-04 | Disposition: A | Discharge status: Home / Self Care | Payer: Medicare Other | Attending: Emergency Medicine | Admitting: Emergency Medicine

## 2020-08-04 ENCOUNTER — Encounter (HOSPITAL_COMMUNITY): Payer: Self-pay

## 2020-08-04 DIAGNOSIS — E1139 Type 2 diabetes mellitus with other diabetic ophthalmic complication: Secondary | ICD-10-CM | POA: Diagnosis not present

## 2020-08-04 DIAGNOSIS — I10 Essential (primary) hypertension: Secondary | ICD-10-CM | POA: Insufficient documentation

## 2020-08-04 DIAGNOSIS — E785 Hyperlipidemia, unspecified: Secondary | ICD-10-CM | POA: Diagnosis not present

## 2020-08-04 DIAGNOSIS — E1169 Type 2 diabetes mellitus with other specified complication: Secondary | ICD-10-CM | POA: Insufficient documentation

## 2020-08-04 DIAGNOSIS — Z79899 Other long term (current) drug therapy: Secondary | ICD-10-CM | POA: Insufficient documentation

## 2020-08-04 DIAGNOSIS — Z7984 Long term (current) use of oral hypoglycemic drugs: Secondary | ICD-10-CM | POA: Insufficient documentation

## 2020-08-04 DIAGNOSIS — R079 Chest pain, unspecified: Secondary | ICD-10-CM | POA: Diagnosis not present

## 2020-08-04 DIAGNOSIS — E1136 Type 2 diabetes mellitus with diabetic cataract: Secondary | ICD-10-CM | POA: Diagnosis not present

## 2020-08-04 DIAGNOSIS — Y92410 Unspecified street and highway as the place of occurrence of the external cause: Secondary | ICD-10-CM | POA: Insufficient documentation

## 2020-08-04 DIAGNOSIS — Z041 Encounter for examination and observation following transport accident: Secondary | ICD-10-CM | POA: Diagnosis not present

## 2020-08-04 DIAGNOSIS — Z853 Personal history of malignant neoplasm of breast: Secondary | ICD-10-CM | POA: Insufficient documentation

## 2020-08-04 MED ORDER — METHOCARBAMOL 500 MG PO TABS: 500.0000 mg | ORAL_TABLET | Freq: Two times a day (BID) | ORAL | 0 refills | Status: DC

## 2020-08-04 MED ORDER — ACETAMINOPHEN 500 MG PO TABS
1000.0000 mg | ORAL_TABLET | Freq: Once | ORAL | Status: AC
  Administered 2020-08-04: 1000 mg via ORAL
  Filled 2020-08-04: qty 2
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
## 2020-08-04 NOTE — ED Notes (Signed)
Discharge instructions reviewed and patient verbalized understanding. Patient given incentive spirometry and instructions. Patient ambulatory leaving ED. Friend picking her up and waiting outside.

## 2020-08-10 ENCOUNTER — Ambulatory Visit: Payer: Medicare Other

## 2020-08-17 ENCOUNTER — Ambulatory Visit: Payer: Medicare Other | Admitting: Family Medicine

## 2020-08-22 ENCOUNTER — Encounter: Payer: Self-pay | Admitting: Family Medicine

## 2020-08-22 ENCOUNTER — Ambulatory Visit (INDEPENDENT_AMBULATORY_CARE_PROVIDER_SITE_OTHER): Payer: Medicare Other | Admitting: Family Medicine

## 2020-08-22 ENCOUNTER — Other Ambulatory Visit: Payer: Self-pay

## 2020-08-22 VITALS — BP 132/80 | HR 84 | Ht 63.0 in | Wt 183.8 lb

## 2020-08-22 DIAGNOSIS — R8290 Unspecified abnormal findings in urine: Secondary | ICD-10-CM

## 2020-08-22 DIAGNOSIS — L819 Disorder of pigmentation, unspecified: Secondary | ICD-10-CM | POA: Diagnosis not present

## 2020-08-22 LAB — POCT URINALYSIS DIP (MANUAL ENTRY)
Bilirubin, UA: NEGATIVE
Glucose, UA: NEGATIVE mg/dL
Nitrite, UA: NEGATIVE
Protein Ur, POC: NEGATIVE mg/dL
Spec Grav, UA: 1.02 (ref 1.010–1.025)
Urobilinogen, UA: 2 E.U./dL — AB
pH, UA: 6 (ref 5.0–8.0)

## 2020-08-22 LAB — POCT UA - MICROSCOPIC ONLY

## 2020-08-22 NOTE — Assessment & Plan Note (Signed)
Abnormal urine odor of unknown etiology.  Patient denies any dysuria.  Reports it is worse in the morning.  Urinalysis showed trace ketones, moderate leukocytes but did not indicate infection.  Do not believe this requires treatment at this time.  Will monitor and treat as needed.

## 2020-08-22 NOTE — Progress Notes (Addendum)
    SUBJECTIVE:   CHIEF COMPLAINT / HPI:   Abnormal urine odor Patient reports that for the past few days she has noticed an abnormal urine odor.  She reports that it is worse in the morning right when she wakes up and seems to clear up a little throughout the day.  Denies any dysuria.  Reports it does not smell too sweet but has a [place] tends to help.  Has no other symptoms.  Her blood sugars have been well controlled with most recent being 112.  Tongue concern Patient continues to have issues regarding plaque buildup on her tongue as well as darkening of her skin.  The darkening of her skin is stable from previous exams but she reports she feels there is little less plaque than normal on her tongue.  Denies any pain of her tongue.   OBJECTIVE:   BP 132/80   Pulse 84   Ht 5\' 3"  (1.6 m)   Wt 183 lb 12.8 oz (83.4 kg)   SpO2 96%   BMI 32.56 kg/m   General: Well-appearing 60-year-old female, no acute distress HEENT: Patient has hypopigmented areas on her tongue which are stable from previous exam, no abnormal plaque or lesions noted. Cardiac: Regular rate and rhythm, no murmurs appreciated none  respiratory: Normal work of breathing, lungs clear to auscultation bilaterally Abdomen: Soft, nontender, positive bowel sounds MSK: No gross abnormalities    ASSESSMENT/PLAN:   Abnormal urine odor Abnormal urine odor of unknown etiology.  Patient denies any dysuria.  Reports it is worse in the morning.  Urinalysis showed trace ketones, moderate leukocytes but did not indicate infection.  Do not believe this requires treatment at this time.  Will monitor and treat as needed.  Melanin pigmentation of oral mucosa Patient with melanin deposits on her tongue.  These are benign with no abnormal concerns at this time.  We will monitor.     Gifford Shave, MD [place]

## 2020-08-22 NOTE — Patient Instructions (Signed)
It was great seeing you today.  Your doing a great job with your weight loss, congratulations on the 7 pound loss.  Regarding your diabetes I cannot check your A1c today but we can check it after 10 August.  Given your abnormal urine odor we will check your urine to see if you have any infection going on or excess sugar in your urine.  Please be sure to go to your pharmacy and get your shingles vaccine.  We do not have the pneumonia vaccine right now but hopefully we will have it for your next visit and you can get it then.  If you have any questions or concerns please call the clinic.  Hope you have a wonderful afternoon!

## 2020-08-22 NOTE — Assessment & Plan Note (Signed)
Patient with melanin deposits on her tongue.  These are benign with no abnormal concerns at this time.  We will monitor.

## 2020-08-30 ENCOUNTER — Other Ambulatory Visit: Payer: Self-pay | Admitting: Cardiology

## 2020-08-30 DIAGNOSIS — I10 Essential (primary) hypertension: Secondary | ICD-10-CM

## 2020-09-19 ENCOUNTER — Ambulatory Visit (INDEPENDENT_AMBULATORY_CARE_PROVIDER_SITE_OTHER): Payer: Medicare Other | Admitting: Family Medicine

## 2020-09-19 ENCOUNTER — Other Ambulatory Visit: Payer: Self-pay

## 2020-09-19 VITALS — BP 128/68 | HR 84 | Ht 63.0 in | Wt 180.6 lb

## 2020-09-19 DIAGNOSIS — Z23 Encounter for immunization: Secondary | ICD-10-CM | POA: Diagnosis not present

## 2020-09-19 DIAGNOSIS — E11319 Type 2 diabetes mellitus with unspecified diabetic retinopathy without macular edema: Secondary | ICD-10-CM | POA: Diagnosis not present

## 2020-09-19 DIAGNOSIS — B370 Candidal stomatitis: Secondary | ICD-10-CM | POA: Diagnosis not present

## 2020-09-19 LAB — POCT GLYCOSYLATED HEMOGLOBIN (HGB A1C): HbA1c, POC (controlled diabetic range): 7 % (ref 0.0–7.0)

## 2020-09-19 MED ORDER — METFORMIN HCL ER 500 MG PO TB24: 1000.0000 mg | ORAL_TABLET | Freq: Two times a day (BID) | ORAL | 3 refills | Status: DC

## 2020-09-19 NOTE — Progress Notes (Signed)
    SUBJECTIVE:   CHIEF COMPLAINT / HPI:   Diabetes checkup  Patient feels that her diabetes has been doing well.  She reports that her sugars have been well controlled and feels like her hemoglobin A1c is going to be improved from the previous check which was 8.3.  Is compliant with her metformin 1000 mg in the morning and 500 mg at night.  Denies any hypoglycemic events.  Tongue  Patient continues to be concerned about the white plaque on her tongue.  She says she is easily able to scrape it off in the morning with her toothbrush but it keeps building up and she is unsure why.  She also notices the hyperpigmentation of the tongue which we have discussed is normal.  OBJECTIVE:   BP 128/68   Pulse 84   Ht '5\' 3"'$  (1.6 m)   Wt 180 lb 9.6 oz (81.9 kg)   SpO2 100%   BMI 31.99 kg/m   General: Well-appearing 60 year old female, no acute distress Cardiac: Regular rate and rhythm, no murmurs or COVID Respiratory: Breathing, lungs clear to auscultation bilaterally Abdomen: Soft, nontender, positive bowel sounds MSK: No gross abnormalities   ASSESSMENT/PLAN:   Oral candidiasis Patient continues to have concerns about white plaque buildup on her tongue.  Do not feel that this is oral candidiasis at this time.  Looks like normal physiologic tongue plaque.  Discussed scraping it off when she brushes her teeth and patient is agreeable to this.  We will continue to monitor at this time and not prescribing medications.  Diabetes ([place]) Hemoglobin A1c at last visit was 8.2.  Patient increased at last visit to metformin 1000 mg in the morning and 500 at night.  She never titrated up to 1000 mg twice daily.  Instructed patient to increase to 1000 mg twice daily and we will follow-up in 3 months for repeat hemoglobin A1c.  No further questions or concerns at this time.     Gifford Shave, MD [place]

## 2020-09-19 NOTE — Patient Instructions (Signed)
It was great seeing you today.  Congratulations on your A1c improvement.  We are increasing your metformin to 2 pills twice daily.  I sent a refill of this to your pharmacy.  If you have any questions or concerns please call the clinic.  Happy of wonderful afternoon!

## 2020-09-20 NOTE — Assessment & Plan Note (Signed)
Hemoglobin A1c at last visit was 8.2.  Patient increased at last visit to metformin 1000 mg in the morning and 500 at night.  She never titrated up to 1000 mg twice daily.  Instructed patient to increase to 1000 mg twice daily and we will follow-up in 3 months for repeat hemoglobin A1c.  No further questions or concerns at this time.

## 2020-09-20 NOTE — Assessment & Plan Note (Signed)
Patient continues to have concerns about white plaque buildup on her tongue.  Do not feel that this is oral candidiasis at this time.  Looks like normal physiologic tongue plaque.  Discussed scraping it off when she brushes her teeth and patient is agreeable to this.  We will continue to monitor at this time and not prescribing medications.

## 2020-09-20 NOTE — Assessment & Plan Note (Signed)
>>  ASSESSMENT AND PLAN FOR DM (DIABETES MELLITUS) WITH COMPLICATIONS (HCC) WRITTEN ON 09/20/2020  4:13 PM BY CRESENZO, VICTOR, MD  Hemoglobin A1c at last visit was 8.2.  Patient increased at last visit to metformin  1000 mg in the morning and 500 at night.  She never titrated up to 1000 mg twice daily.  Instructed patient to increase to 1000 mg twice daily and we will follow-up in 3 months for repeat hemoglobin A1c.  No further questions or concerns at this time.

## 2020-10-01 ENCOUNTER — Other Ambulatory Visit: Payer: Self-pay | Admitting: Cardiology

## 2020-10-01 DIAGNOSIS — I10 Essential (primary) hypertension: Secondary | ICD-10-CM

## 2020-11-09 ENCOUNTER — Ambulatory Visit (INDEPENDENT_AMBULATORY_CARE_PROVIDER_SITE_OTHER): Payer: Medicare Other | Admitting: Internal Medicine

## 2020-11-09 ENCOUNTER — Encounter: Payer: Self-pay | Admitting: Internal Medicine

## 2020-11-09 ENCOUNTER — Other Ambulatory Visit (INDEPENDENT_AMBULATORY_CARE_PROVIDER_SITE_OTHER): Payer: Medicare Other

## 2020-11-09 VITALS — BP 130/70 | HR 80 | Ht 63.0 in | Wt 183.0 lb

## 2020-11-09 DIAGNOSIS — K760 Fatty (change of) liver, not elsewhere classified: Secondary | ICD-10-CM

## 2020-11-09 DIAGNOSIS — R7989 Other specified abnormal findings of blood chemistry: Secondary | ICD-10-CM

## 2020-11-09 LAB — COMPREHENSIVE METABOLIC PANEL
ALT: 12 U/L (ref 0–35)
AST: 15 U/L (ref 0–37)
Albumin: 4.3 g/dL (ref 3.5–5.2)
Alkaline Phosphatase: 48 U/L (ref 39–117)
BUN: 11 mg/dL (ref 6–23)
CO2: 32 mEq/L (ref 19–32)
Calcium: 10 mg/dL (ref 8.4–10.5)
Chloride: 99 mEq/L (ref 96–112)
Creatinine, Ser: 0.84 mg/dL (ref 0.40–1.20)
GFR: 75.41 mL/min (ref >60.00)
Glucose, Bld: 142 mg/dL — ABNORMAL HIGH (ref 70–99)
Potassium: 3.9 mEq/L (ref 3.5–5.1)
Sodium: 140 mEq/L (ref 135–145)
Total Bilirubin: 0.5 mg/dL (ref 0.2–1.2)
Total Protein: 7.8 g/dL (ref 6.0–8.3)

## 2020-11-09 LAB — CBC WITH DIFFERENTIAL/PLATELET
Basophils Absolute: 0.1 10*3/uL (ref 0.0–0.1)
Basophils Relative: 0.7 % (ref 0.0–3.0)
Eosinophils Absolute: 0.2 10*3/uL (ref 0.0–0.7)
Eosinophils Relative: 1.9 % (ref 0.0–5.0)
HCT: 41.9 % (ref 36.0–46.0)
Hemoglobin: 13.3 g/dL (ref 12.0–15.0)
Lymphocytes Relative: 29.4 % (ref 12.0–46.0)
Lymphs Abs: 2.4 10*3/uL (ref 0.7–4.0)
MCHC: 31.8 g/dL (ref 30.0–36.0)
MCV: 75.3 fl — ABNORMAL LOW (ref 78.0–100.0)
Monocytes Absolute: 0.5 10*3/uL (ref 0.1–1.0)
Monocytes Relative: 6.4 % (ref 3.0–12.0)
Neutro Abs: 5 10*3/uL (ref 1.4–7.7)
Neutrophils Relative %: 61.6 % (ref 43.0–77.0)
Platelets: 90 10*3/uL — ABNORMAL LOW (ref 150.0–400.0)
RBC: 5.57 Mil/uL — ABNORMAL HIGH (ref 3.87–5.11)
RDW: 15.4 % (ref 11.5–15.5)
WBC: 8 10*3/uL (ref 4.0–10.5)

## 2020-11-09 LAB — PROTIME-INR
INR: 1.1 ratio — ABNORMAL HIGH (ref 0.8–1.0)
Prothrombin Time: 11.8 s (ref 9.6–13.1)

## 2020-11-09 NOTE — Patient Instructions (Signed)
If you are age 60 or older, your body mass index should be between 23-30. Your Body mass index is 32.42 kg/m. If this is out of the aforementioned range listed, please consider follow up with your Primary Care Provider.  If you are age 38 or younger, your body mass index should be between 19-25. Your Body mass index is 32.42 kg/m. If this is out of the aformentioned range listed, please consider follow up with your Primary Care Provider.   __________________________________________________________  The [facility] providers would like to encourage you to use [facility] to communicate with providers for non-urgent requests or questions.  Due to long hold times on the telephone, sending your provider a message by [facility] may be a faster and more efficient way to get a response.  Please allow 48 business hours for a response.  Please remember that this is for non-urgent requests.   Your provider has requested that you go to the basement level for lab work before leaving today. Press "B" on the elevator. The lab is located at the first door on the left as you exit the elevator.  Continue exercise and weight loss  Follow up in one year

## 2020-11-09 NOTE — Progress Notes (Signed)
HISTORY OF PRESENT ILLNESS:  [name] is a 60 y.o. female with a history of breast cancer and chronic thrombocytopenia for which she is followed by oncology.  She has been seen in this office for colon cancer screening as well as fatty liver disease.  She was last evaluated November 10, 2019.  See that dictation for details.  At that time we discussed the importance of exercise and weight loss.  Patient tells me that she has been doing well since her last visit.  She has been riding a stationary bike.  She appears to have lost about 9 pounds.  MRI of the liver in July 2020 revealed simple hepatic cysts without worrisome features and severe hepatic steatosis without evidence of cirrhosis.  Her last colonoscopy was March 2014.  This was normal except for diverticulosis.  REVIEW OF SYSTEMS:  All non-GI ROS negative otherwise stated in the HPI.  Past Medical History:  Diagnosis Date   Arthritis    Breast CA ([place])    breast CA- right breast   Diabetes mellitus    Hyperlipidemia    Hypertension    Thrombocytopenia ([place])    Thyroid disease    parathyroid removed    Past Surgical History:  Procedure Laterality Date   ABDOMINAL HYSTERECTOMY     1996 per pt   BREAST BIOPSY Right 06/2009   BREAST SURGERY  09/12/10   excision of skin mass - right breast, had another surgery to remove more Cancer cells   INNER EAR SURGERY  06/01/13   MASTECTOMY Right 07/21/2009   PARATHYROIDECTOMY     partial?   REDUCTION MAMMAPLASTY Left 2013    Social History [name]  reports that she has never smoked. She has never used smokeless tobacco. She reports that she does not drink alcohol and does not use drugs.  family history includes CAD (age of onset: 16) in her mother; Heart attack (age of onset: 33) in her brother; Stroke in her father.  No Known Allergies     PHYSICAL EXAMINATION: Vital signs: BP 130/70   Pulse 80   Ht 5\' 3"  (1.6 m)   Wt 183 lb (83 kg)   SpO2 98%   BMI 32.42 kg/m    Constitutional: generally well-appearing, no acute distress Psychiatric: alert and oriented x3, cooperative Eyes: extraocular movements intact, anicteric, conjunctiva pink Mouth: oral pharynx moist, no lesions Neck: supple no lymphadenopathy Cardiovascular: heart regular rate and rhythm, no murmur Lungs: clear to auscultation bilaterally Abdomen: soft, nontender, nondistended, no obvious ascites, no peritoneal signs, normal bowel sounds, no organomegaly Rectal: Omitted Extremities: no clubbing, cyanosis, or lower extremity edema bilaterally Skin: no lesions on visible extremities Neuro: No focal deficits. No asterixis.     ASSESSMENT:  1.  NASH/fatty liver disease.  Clinically stable. 2.  Screening colonoscopy 2014 negative for neoplasia 3.  Obesity   PLAN:  1.  Continue exercise and weight loss 2.  Follow-up blood work today including CBC, comprehensive metabolic panel, and PT/INR.  We will contact the patient with the results when available 3.  Routine office follow-up 1 year 4.  Screening colonoscopy 2024  ADDENDUM: Blood work has returned.  CBC is without significant change since last year.  Normal white blood cell count and hemoglobin.  Chronic mild microcytosis as well as chronic thrombocytopenia without change for years.  Liver tests have normalized.  INR 1.1.  A total time of 30 minutes was spent preparing to see the patient, reviewing test, obtaining comprehensive history, performing comprehensive  physical examination, counseling the patient regarding her above listed issues, ordering laboratories, recommending follow-up interval, and documenting clinical information in the health record

## 2020-11-12 ENCOUNTER — Other Ambulatory Visit: Payer: Self-pay | Admitting: Cardiology

## 2020-11-12 DIAGNOSIS — I10 Essential (primary) hypertension: Secondary | ICD-10-CM

## 2020-11-22 ENCOUNTER — Ambulatory Visit: Payer: Medicare Other | Admitting: Family Medicine

## 2020-11-23 ENCOUNTER — Other Ambulatory Visit: Payer: Self-pay | Admitting: Cardiology

## 2020-11-23 DIAGNOSIS — I10 Essential (primary) hypertension: Secondary | ICD-10-CM

## 2020-11-26 ENCOUNTER — Other Ambulatory Visit: Payer: Self-pay | Admitting: Cardiology

## 2020-11-26 DIAGNOSIS — I10 Essential (primary) hypertension: Secondary | ICD-10-CM

## 2020-11-29 ENCOUNTER — Other Ambulatory Visit: Payer: Self-pay

## 2020-11-29 DIAGNOSIS — I10 Essential (primary) hypertension: Secondary | ICD-10-CM

## 2020-11-29 MED ORDER — FUROSEMIDE 20 MG PO TABS: ORAL_TABLET | ORAL | 0 refills | Status: DC

## 2020-11-30 ENCOUNTER — Other Ambulatory Visit: Payer: Self-pay | Admitting: Family Medicine

## 2020-11-30 DIAGNOSIS — I10 Essential (primary) hypertension: Secondary | ICD-10-CM

## 2020-12-22 ENCOUNTER — Ambulatory Visit (INDEPENDENT_AMBULATORY_CARE_PROVIDER_SITE_OTHER): Payer: Medicare Other

## 2020-12-22 ENCOUNTER — Other Ambulatory Visit: Payer: Self-pay

## 2020-12-22 ENCOUNTER — Ambulatory Visit: Payer: Medicare Other | Admitting: Family Medicine

## 2020-12-22 DIAGNOSIS — Z23 Encounter for immunization: Secondary | ICD-10-CM

## 2020-12-22 NOTE — Progress Notes (Signed)
Patient presents to nurse clinic for flu vaccination. Administered in LD, site unremarkable, tolerated injection well.   Jacobus Colvin C Yasser Hepp, RN   

## 2021-01-08 ENCOUNTER — Encounter: Payer: Self-pay | Admitting: Family Medicine

## 2021-01-08 ENCOUNTER — Ambulatory Visit (INDEPENDENT_AMBULATORY_CARE_PROVIDER_SITE_OTHER): Payer: Medicare Other | Admitting: Family Medicine

## 2021-01-08 ENCOUNTER — Other Ambulatory Visit: Payer: Self-pay

## 2021-01-08 VITALS — BP 160/84 | HR 79 | Ht 63.0 in | Wt 183.1 lb

## 2021-01-08 DIAGNOSIS — M5432 Sciatica, left side: Secondary | ICD-10-CM | POA: Diagnosis not present

## 2021-01-08 DIAGNOSIS — E785 Hyperlipidemia, unspecified: Secondary | ICD-10-CM

## 2021-01-08 DIAGNOSIS — E11319 Type 2 diabetes mellitus with unspecified diabetic retinopathy without macular edema: Secondary | ICD-10-CM | POA: Diagnosis not present

## 2021-01-08 DIAGNOSIS — I10 Essential (primary) hypertension: Secondary | ICD-10-CM

## 2021-01-08 LAB — POCT GLYCOSYLATED HEMOGLOBIN (HGB A1C): HbA1c, POC (controlled diabetic range): 6.6 % (ref 0.0–7.0)

## 2021-01-08 MED ORDER — METFORMIN HCL ER 500 MG PO TB24: 1000.0000 mg | ORAL_TABLET | Freq: Two times a day (BID) | ORAL | 3 refills | Status: DC

## 2021-01-08 MED ORDER — GABAPENTIN 100 MG PO CAPS: 100.0000 mg | ORAL_CAPSULE | Freq: Every evening | ORAL | 3 refills | Status: DC

## 2021-01-08 MED ORDER — CARVEDILOL 25 MG PO TABS
25.0000 mg | ORAL_TABLET | Freq: Two times a day (BID) | ORAL | 3 refills | Status: DC
Start: 2021-01-08 — End: 2022-04-19

## 2021-01-08 MED ORDER — HYDROCHLOROTHIAZIDE 25 MG PO TABS: 25.0000 mg | ORAL_TABLET | Freq: Every day | ORAL | 3 refills | Status: DC

## 2021-01-08 MED ORDER — LISINOPRIL 40 MG PO TABS: 40.0000 mg | ORAL_TABLET | Freq: Every day | ORAL | 0 refills | Status: DC

## 2021-01-08 MED ORDER — ROSUVASTATIN CALCIUM 10 MG PO TABS: ORAL_TABLET | ORAL | 3 refills | Status: DC

## 2021-01-08 MED ORDER — CARVEDILOL 12.5 MG PO TABS: 12.5000 mg | ORAL_TABLET | Freq: Two times a day (BID) | ORAL | 0 refills | Status: DC

## 2021-01-08 NOTE — Progress Notes (Signed)
SUBJECTIVE:   CHIEF COMPLAINT / HPI:   Posterior leg pain Patient reports that she has been having pain that starts at the posterior aspect of her hip and radiates down the back of her left leg to her ankle.  She describes it as shooting pain that is worse at night.  She has used heat with some relief.  She takes Tylenol with very little relief.  Reports that it has not relieved by movement.  Cannot identify any injury.  Diabetes Patient reports she has been doing well after increasing her metformin to 1000 mg twice daily.  Her sugars when checked have been between 120 and 150.  She is pleased with the effects of the medication.  Denies any hypoglycemic episodes.  Her only medication is metformin she is not on any insulin.  Hypertension Patient reports good compliance with her antihypertensive medications. Currently on Coreg 12.5 mg, lisinopril 40 mg, hydrochlorothiazide 25 mg.  Reports that she has had some elevated blood pressures recently.  Denies any headaches or shortness of breath.  PERTINENT  PMH / PSH: Hypertension, diabetes  OBJECTIVE:   BP (!) 160/84   Pulse 79   Ht 5\' 3"  (1.6 m)   Wt 183 lb 2 oz (83.1 kg)   SpO2 100%   BMI 32.44 kg/m   General: Well-appearing 60 year old female, no acute distress Cardiac: Regular rate and rhythm, no murmurs appreciated Respiratory: Normal breathing, lungs clear to auscultation bilaterally Abdomen: Soft, nontender, positive bowel sounds MSK: Patient with full range of motion of the left hip, ambulates without difficulty, is able to identify where the pain starts in the posterior aspect of her hip and show where it shoots down to her ankle.  No gross abnormalities noted, full strength in upper and lower extremities  ASSESSMENT/PLAN:   Essential hypertension Blood pressure is elevated today 160/84.  Repeat similar blood pressure.  Patient currently on Coreg 12.5, lisinopril 40 mg, hydrochlorothiazide 25 mg.  We will increase the Coreg  to 25 mg twice daily and continue to maintain lisinopril 40 mg daily and hydrochlorothiazide 25 mg daily. - Continue to monitor blood pressures, - No need for repeat BMP today with most recent 1 being in October of this year. - Follow-up in 1 month for repeat blood pressure check and to address possible need to add other medications. - Strict ED return precautions given.  Diabetes ([place]) Hemoglobin A1c today was 6.6.  Metformin 1000 mg twice daily is the patient's current regimen.  She is extremely pleased with these results.  Could consider transition from metformin to once weekly injectable such as Ozempic in the future but will continue on the metformin at this time.  No further questions or concerns.  Sciatica of left side Patient with signs and symptoms consistent with left-sided sciatica.  Will prescribe Neurontin 100 mg nightly to see if symptomatic improvement.  Called patient to discuss Crestor adjustments and she reports that she took the Neurontin last night and had strange dreams as well as woke up sweating.  She would not like to take this anymore and requests refills on her tramadol.  Discussed other options such as NSAIDs and physical therapy.  Patient is agreeable to see physical therapy and would like for them to evaluate if she needs a handicap placard for her car.  Referral placed for physical therapy.  Hyperlipidemia Patient currently on Crestor 10 mg 3 times a week.  Repeat lipid panel today showed ASCVD risk of 28.3% and recommended high intensity  statin.  Patient had intolerance to Crestor in the past due to myalgias which is why she is on her current regimen.  We will plan to increase to 20 mg daily.  Called patient and discussed increasing to 20 mg daily and she is agreeable to this.     Gifford Shave, MD [place]

## 2021-01-08 NOTE — Patient Instructions (Addendum)
It was wonderful seeing you today.  Your diabetes is doing great and I want you to continue taking the metformin 1000 mg twice daily.  Your hemoglobin A1c today was 6.6.  I refilled all of your medications.  Your blood pressure was elevated so we increased your carvedilol (Coreg) to 25 mg twice daily. I am to check your cholesterol today.  I want for you to make an appointment in 1 month to check your blood pressure and see if we need to make additional changes.  If you have any questions or concerns call the clinic.  I hope you have a great afternoon!

## 2021-01-09 DIAGNOSIS — M5432 Sciatica, left side: Secondary | ICD-10-CM | POA: Insufficient documentation

## 2021-01-09 LAB — LIPID PANEL
Chol/HDL Ratio: 3.5 ratio (ref 0.0–4.4)
Cholesterol, Total: 220 mg/dL — ABNORMAL HIGH (ref 100–199)
HDL: 62 mg/dL (ref >39)
LDL Chol Calc (NIH): 111 mg/dL — ABNORMAL HIGH (ref 0–99)
Triglycerides: 273 mg/dL — ABNORMAL HIGH (ref 0–149)
VLDL Cholesterol Cal: 47 mg/dL — ABNORMAL HIGH (ref 5–40)

## 2021-01-09 MED ORDER — ROSUVASTATIN CALCIUM 20 MG PO TABS: 20.0000 mg | ORAL_TABLET | Freq: Every day | ORAL | 3 refills | Status: DC

## 2021-01-09 NOTE — Assessment & Plan Note (Signed)
>>  ASSESSMENT AND PLAN FOR DM (DIABETES MELLITUS) WITH COMPLICATIONS (HCC) WRITTEN ON 01/09/2021  8:32 AM BY CRESENZO, VICTOR, MD  Hemoglobin A1c today was 6.6.  Metformin  1000 mg twice daily is the patient's current regimen.  She is extremely pleased with these results.  Could consider transition from metformin  to once weekly injectable such as Ozempic  in the future but will continue on the metformin  at this time.  No further questions or concerns.

## 2021-01-09 NOTE — Assessment & Plan Note (Signed)
Hemoglobin A1c today was 6.6.  Metformin 1000 mg twice daily is the patient's current regimen.  She is extremely pleased with these results.  Could consider transition from metformin to once weekly injectable such as Ozempic in the future but will continue on the metformin at this time.  No further questions or concerns.

## 2021-01-09 NOTE — Assessment & Plan Note (Addendum)
Patient with signs and symptoms consistent with left-sided sciatica.  Will prescribe Neurontin 100 mg nightly to see if symptomatic improvement.  Called patient to discuss Crestor adjustments and she reports that she took the Neurontin last night and had strange dreams as well as woke up sweating.  She would not like to take this anymore and requests refills on her tramadol.  Discussed other options such as NSAIDs and physical therapy.  Patient is agreeable to see physical therapy and would like for them to evaluate if she needs a handicap placard for her car.  Referral placed for physical therapy.

## 2021-01-09 NOTE — Assessment & Plan Note (Signed)
Blood pressure is elevated today 160/84.  Repeat similar blood pressure.  Patient currently on Coreg 12.5, lisinopril 40 mg, hydrochlorothiazide 25 mg.  We will increase the Coreg to 25 mg twice daily and continue to maintain lisinopril 40 mg daily and hydrochlorothiazide 25 mg daily. - Continue to monitor blood pressures, - No need for repeat BMP today with most recent 1 being in October of this year. - Follow-up in 1 month for repeat blood pressure check and to address possible need to add other medications. - Strict ED return precautions given.

## 2021-01-09 NOTE — Assessment & Plan Note (Signed)
Patient currently on Crestor 10 mg 3 times a week.  Repeat lipid panel today showed ASCVD risk of 28.3% and recommended high intensity statin.  Patient had intolerance to Crestor in the past due to myalgias which is why she is on her current regimen.  We will plan to increase to 20 mg daily.  Called patient and discussed increasing to 20 mg daily and she is agreeable to this.

## 2021-01-30 ENCOUNTER — Ambulatory Visit: Payer: Medicare Other

## 2021-01-30 ENCOUNTER — Other Ambulatory Visit: Payer: Self-pay

## 2021-01-30 ENCOUNTER — Telehealth: Payer: Self-pay

## 2021-01-30 ENCOUNTER — Telehealth: Payer: Self-pay | Admitting: Family Medicine

## 2021-01-30 ENCOUNTER — Ambulatory Visit (INDEPENDENT_AMBULATORY_CARE_PROVIDER_SITE_OTHER): Payer: Medicare Other | Admitting: Family Medicine

## 2021-01-30 DIAGNOSIS — U071 COVID-19: Secondary | ICD-10-CM

## 2021-01-30 MED ORDER — PAXLOVID (300/100) 20 X 150 MG & 10 X 100MG PO TBPK: 1.0000 | ORAL_TABLET | Freq: Two times a day (BID) | ORAL | 0 refills | Status: DC

## 2021-01-30 MED ORDER — PAXLOVID (300/100) 20 X 150 MG & 10 X 100MG PO TBPK: 3.0000 | ORAL_TABLET | Freq: Two times a day (BID) | ORAL | 0 refills | Status: DC

## 2021-01-30 NOTE — Telephone Encounter (Signed)
Pharmacy calls nurse line requesting clarification on Paxlovid dosing. Pharmacy reports "typically" medication is written as directed with a quantity of 30. Pharmacy reports the patient takes 3 tab combo BID for 5 days.   Please resend.

## 2021-01-30 NOTE — Patient Instructions (Addendum)
It was wonderful to meet you today. Thank you for allowing me to be a part of your care. Below is a short summary of what we discussed at your visit today:  COVID I have sent in a medication called Paxlovid  This will treat your COVID infection.  You will take it twice a day for 5 days.  Other than that, I recommend symptomatic treatment with over-the-counter medications such as Tylenol, cough medication, throat lozenges.  I also recommend tea, ginger, honey, and pineapple juice (may help with cough).  Reasons to go to the hospital If you develop shortness of breath, difficulty catching her breath, or wake up in the middle of the night short of breath.      Please bring all of your medications to every appointment!  If you have any questions or concerns, please do not hesitate to contact us via phone or MyChart message.   Ezequiel Essex, MD

## 2021-01-30 NOTE — Assessment & Plan Note (Signed)
New, acute.  Positive home test 12/26.  Mild symptoms thus far.  Risk factors for worsening COVID requiring hospitalization include hypertension, diabetes, and weight in the overweight category.  Prescribed Paxlovid twice daily x5 days.  Otherwise, recommended symptomatic relief with OTC medications.  See AVS for more.  Return precautions discussed.

## 2021-01-30 NOTE — Progress Notes (Signed)
[place] Telemedicine Visit  Patient consented to have virtual visit and was identified by name and date of birth. Method of visit: Telephone  Encounter participants: Patient: [name] - located in vehicle parked in the clinic parking lot Provider: Ezequiel Essex - located at [facility]  Chief Complaint: COVID(+)  HPI: COVID (+) via home test yesterday Two sisters, one tested positive last Monday (before Christmas), but patient tested negative that day Another sister tested positive on Christmas Eve, patient tested that day and was negative Was last around sister who was positive on Christmas Eve Patient and her husband both tested positive via home test yesterday 12/26 Everyone else in house was negative yesterday (two sons, brother in law)  Sxs: body aches, decreased taste, runny nose, sneezing, a little headache Sxs duration x2 weeks, worsening over that time No fever, chills, sinus congestion, nausea, vomiting, diarrhea, constipation, SOB  Risk factors: HTN, T2DM, overweight  ROS: per HPI  Pertinent PMHx:  Patient Active Problem List   Diagnosis Date Noted   COVID 01/30/2021   Sciatica of left side 01/09/2021   Melanin pigmentation of oral mucosa 08/22/2020   Oral candidiasis 05/26/2020   Ear canal mass, left 12/29/2019   Dysuria 04/01/2019   Disability examination 07/68/0881   Nonalcoholic hepatosteatosis 12/05/5943   Abnormal urine odor 09/23/2018   Hepatic lesion 08/11/2018   Generalized edema 07/14/2018   Need for immunization against influenza 12/04/2017   Arthritis of both hands 04/03/2016   Obesity 08/30/2015   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 02/17/2015   Cataracts, bilateral 09/13/2014   Glaucoma 09/13/2014   Mitral regurgitation 02/18/2014   Hip pain 01/07/2014   Transaminitis 11/13/2012   Hyperlipidemia 04/04/2011   Pedal edema 04/04/2011   Diabetes ([place]) 06/07/2010   Cancer of central portion of right female breast  ([place]) 10/04/2009   Idiopathic thrombocytopenic purpura (ITP) ([place]) 06/22/2009   Essential hypertension 06/22/2009   HYPERPARATHYROIDISM, HX OF 06/22/2009   Avitaminosis D 09/01/2008     Exam:  There were no vitals taken for this visit.  Respiratory: Speaking in full sentences, no respiratory distress noted  Assessment/Plan:  COVID New, acute.  Positive home test 12/26.  Mild symptoms thus far.  Risk factors for worsening COVID requiring hospitalization include hypertension, diabetes, and weight in the overweight category.  Prescribed Paxlovid twice daily x5 days.  Otherwise, recommended symptomatic relief with OTC medications.  See AVS for more.  Return precautions discussed.    Time spent during visit with patient: 15 minutes  Ezequiel Essex, MD   PGY-2 [facility]

## 2021-01-30 NOTE — Telephone Encounter (Signed)
My mistake. I will correct the order.   Danielle Essex, MD

## 2021-01-30 NOTE — Telephone Encounter (Signed)
Patient had virtual visit with Dr. Augustin Coupe today.  She received vaccinated.  No need for further treatments.

## 2021-01-30 NOTE — Telephone Encounter (Signed)
Patient calls nurse line regarding positive home COVID test. Patient reports exposure on 12/24, with positive test on 01/29/21. Patient was previously scheduled to come into the office this morning, however, states she was told she would not be able to be seen due to positive COVID test.   Patient reports runny nose, loss of taste, body aches and slight cough. She has not been checking temperature with thermometer. States, "I feel like I am running a fever at nighttime"   Patient is requesting antiviral medication.   Please advise.   Talbot Grumbling, RN

## 2021-02-02 NOTE — Telephone Encounter (Signed)
Error.  [name]

## 2021-02-15 ENCOUNTER — Ambulatory Visit: Payer: Medicare Other | Admitting: Family Medicine

## 2021-02-21 DIAGNOSIS — H40013 Open angle with borderline findings, low risk, bilateral: Secondary | ICD-10-CM | POA: Diagnosis not present

## 2021-02-21 DIAGNOSIS — E119 Type 2 diabetes mellitus without complications: Secondary | ICD-10-CM | POA: Diagnosis not present

## 2021-02-21 DIAGNOSIS — H25813 Combined forms of age-related cataract, bilateral: Secondary | ICD-10-CM | POA: Diagnosis not present

## 2021-02-21 LAB — HM DIABETES EYE EXAM

## 2021-04-05 ENCOUNTER — Other Ambulatory Visit: Payer: Self-pay | Admitting: Family Medicine

## 2021-04-05 DIAGNOSIS — I10 Essential (primary) hypertension: Secondary | ICD-10-CM

## 2021-04-11 ENCOUNTER — Other Ambulatory Visit: Payer: Self-pay | Admitting: Family Medicine

## 2021-04-11 DIAGNOSIS — Z1231 Encounter for screening mammogram for malignant neoplasm of breast: Secondary | ICD-10-CM

## 2021-05-08 ENCOUNTER — Ambulatory Visit (INDEPENDENT_AMBULATORY_CARE_PROVIDER_SITE_OTHER): Payer: Medicare Other | Admitting: Family Medicine

## 2021-05-08 ENCOUNTER — Encounter: Payer: Self-pay | Admitting: Family Medicine

## 2021-05-08 VITALS — BP 138/84 | HR 79 | Ht 63.0 in | Wt 182.4 lb

## 2021-05-08 DIAGNOSIS — R8290 Unspecified abnormal findings in urine: Secondary | ICD-10-CM

## 2021-05-08 DIAGNOSIS — E11319 Type 2 diabetes mellitus with unspecified diabetic retinopathy without macular edema: Secondary | ICD-10-CM

## 2021-05-08 LAB — POCT URINALYSIS DIP (MANUAL ENTRY)
Bilirubin, UA: NEGATIVE
Glucose, UA: NEGATIVE mg/dL
Nitrite, UA: NEGATIVE
Protein Ur, POC: NEGATIVE mg/dL
Spec Grav, UA: 1.02 (ref 1.010–1.025)
Urobilinogen, UA: 1 E.U./dL
pH, UA: 5.5 (ref 5.0–8.0)

## 2021-05-08 LAB — POCT UA - MICROSCOPIC ONLY

## 2021-05-08 LAB — POCT GLYCOSYLATED HEMOGLOBIN (HGB A1C): HbA1c, POC (controlled diabetic range): 6.9 % (ref 0.0–7.0)

## 2021-05-08 MED ORDER — SEMAGLUTIDE(0.25 OR 0.5MG/DOS) 2 MG/1.5ML ~~LOC~~ SOPN: 0.2500 mg | PEN_INJECTOR | SUBCUTANEOUS | 3 refills | Status: DC

## 2021-05-08 NOTE — Patient Instructions (Addendum)
It was great seeing you today!  Your diabetes is doing well and your A1c was 6.9 today.  I want you to continue on your metformin and we are starting you on a medication called Ozempic which can help with blood sugars but can also help with weight loss.  I want to see you in months for recheck of your hemoglobin A1c.  Regarding your urine it does not look like you have a urinary tract infection but I am sending it for culture.  I will MyChart you those results or call you if there are anything abnormal.  If you have any questions or concerns please call the clinic.  I hope you have a wonderful day! ?

## 2021-05-08 NOTE — Assessment & Plan Note (Signed)
Hemoglobin A1c today of 6.9 from 6.6 at previous check.  Currently on metformin 1000 mg twice daily although she does not always take the 1000 mg at night.  Is interested in [place].  Spoke with pharmacy and counseled on Ozempic use.  Prescription sent for Ozempic 0.25.  Will titrate as able.  Follow-up in 3 months for repeat hemoglobin A1c. ?

## 2021-05-08 NOTE — Assessment & Plan Note (Signed)
>>  ASSESSMENT AND PLAN FOR DM (DIABETES MELLITUS) WITH COMPLICATIONS (HCC) WRITTEN ON 05/08/2021  7:54 PM BY CRESENZO, VICTOR, MD  Hemoglobin A1c today of 6.9 from 6.6 at previous check.  Currently on metformin  1000 mg twice daily although she does not always take the 1000 mg at night.  Is interested in Ozempic .  Spoke with pharmacy and counseled on Ozempic  use.  Prescription sent for Ozempic  0.25.  Will titrate as able.  Follow-up in 3 months for repeat hemoglobin A1c.

## 2021-05-08 NOTE — Assessment & Plan Note (Signed)
Unknown etiology for abnormal urine odor.  Has had issues with this in the past.  Trace ketones and moderate leukocytes.  No treatment indicated at this time.  Will send for urine culture and treat as needed. ?

## 2021-05-08 NOTE — Progress Notes (Signed)
? ? ?  SUBJECTIVE:  ? ?CHIEF COMPLAINT / HPI:  ? ?Diabetes checkup ?Patient reports that she has been tolerating her metformin well although sometimes when she takes the 1000 mg at night it makes her feel "funny".  She denies any hypoglycemic episodes and reports her blood sugars are mainly in the 90s-120s.  Reports that she is interested in the once weekly medication that we spoke about at her previous visit.  Previous hemoglobin A1c was 6.6. ? ?Abnormal urine odor ?Patient reports abnormal urine odor for several months.  She reports that it comes and goes but is concerned that she has a UTI.  Denies any dysuria.  Denies any vaginal discharge.  Denies any pelvic pain. ? ?OBJECTIVE:  ? ?BP 138/84   Pulse 79   Ht '5\' 3"'$  (1.6 m)   Wt 182 lb 6.4 oz (82.7 kg)   SpO2 96%   BMI 32.31 kg/m?   ?General: Pleasant 61 year old female in no acute distress ?Cardiac: Regular rate and rhythm ?Respiratory: Breathing, speaking in full sentences ?Abdomen: Soft, nontender, positive bowel sounds ?MSK: No gross abnormalities ? ?ASSESSMENT/PLAN:  ? ?Diabetes ([place]) ?Hemoglobin A1c today of 6.9 from 6.6 at previous check.  Currently on metformin 1000 mg twice daily although she does not always take the 1000 mg at night.  Is interested in [place].  Spoke with pharmacy and counseled on Ozempic use.  Prescription sent for Ozempic 0.25.  Will titrate as able.  Follow-up in 3 months for repeat hemoglobin A1c. ? ?Bad odor of urine ?Unknown etiology for abnormal urine odor.  Has had issues with this in the past.  Trace ketones and moderate leukocytes.  No treatment indicated at this time.  Will send for urine culture and treat as needed. ?  ? ? ?Gifford Shave, MD ?[place]  ? ?

## 2021-05-10 LAB — URINE CULTURE

## 2021-05-10 MED ORDER — CEPHALEXIN 500 MG PO CAPS: 500.0000 mg | ORAL_CAPSULE | Freq: Two times a day (BID) | ORAL | 0 refills | Status: DC

## 2021-05-10 NOTE — Addendum Note (Signed)
Addended by: Concepcion Living on: 05/10/2021 09:57 AM ? ? Modules accepted: Orders ? ?

## 2021-05-21 ENCOUNTER — Encounter: Payer: Self-pay | Admitting: Family Medicine

## 2021-05-21 ENCOUNTER — Ambulatory Visit (INDEPENDENT_AMBULATORY_CARE_PROVIDER_SITE_OTHER): Payer: Medicare Other | Admitting: Family Medicine

## 2021-05-21 DIAGNOSIS — S99911A Unspecified injury of right ankle, initial encounter: Secondary | ICD-10-CM

## 2021-05-21 MED ORDER — OZEMPIC (0.25 OR 0.5 MG/DOSE) 2 MG/3ML ~~LOC~~ SOPN: 0.2500 mg | PEN_INJECTOR | SUBCUTANEOUS | 3 refills | Status: DC

## 2021-05-21 MED ORDER — DICLOFENAC SODIUM 1 % EX GEL: 2.0000 g | Freq: Four times a day (QID) | CUTANEOUS | 3 refills | Status: DC

## 2021-05-21 NOTE — Patient Instructions (Signed)
It was great seeing you today.  I am sorry your right ankle is hurting but it looks like it is more muscular.  I do not think we need to get any imaging today but I want you to switch to the 650 mg Tylenol arthritic pain.  I also want you to ice the ankle when you are resting or heat.  I have sent in a prescription for Voltaren gel which you can apply to the ankle.  If it does not resolve please let me know.  If you have any questions or concerns please call our clinic.  Hope you have a wonderful day! ? ? ?

## 2021-05-21 NOTE — Assessment & Plan Note (Addendum)
History and physical exam consistent with muscle strain or ligament sprain of the right ankle.  Tenderness to palpation along the anterior aspect of the distal tibia.  We will treat conservatively at this time with Tylenol, ice, heat, rest.  Also sent prescription for diclofenac gel.  Provided strict return precautions.  No further questions or concerns. ?

## 2021-05-21 NOTE — Progress Notes (Signed)
? ? ?  SUBJECTIVE:  ? ?CHIEF COMPLAINT / HPI:  ? ?Right ankle pain and swelling  ?Patient reports 1 week history of right ankle pain in the front of the ankle.  She reports swelling and tenderness to palpation of that area.  Denies hitting her ankle on anything.  Denies any injury that she can remember such as twisting or rolling the ankle.  Has been taking Tylenol which does help with the pain as she elevates her foot at night which helps with the swelling.  Reports the swelling is on both legs but definitely worse on the right.  Denies any warmth or erythema to the right lower extremity.  Denies any calf pain. ? ? ?OBJECTIVE:  ? ?BP 138/76   Pulse 84   Ht '5\' 3"'$  (1.6 m)   Wt 184 lb (83.5 kg)   SpO2 99%   BMI 32.59 kg/m?   ?General: Pleasant 61 year old female in no acute distress ?Cardiac: Regular rate and rhythm, no murmurs appreciated ?Respiratory: Normal for breathing, lungs clear to auscultation bilaterally ?MSK: Tenderness to palpation to the anterior aspect of the distal tibia, no injury appreciated, no warmth, erythema.  Mild swelling of the right ankle compared to the left, full range of motion and strength of the ankle.  Pulses intact bilaterally. ? ? ?Diabetic Foot Exam - Simple   ?Simple Foot Form ?Visual Inspection ?No deformities, no ulcerations, no other skin breakdown bilaterally: Yes ?Sensation Testing ?Intact to touch and monofilament testing bilaterally: Yes ?Pulse Check ?Posterior Tibialis and Dorsalis pulse intact bilaterally: Yes ?Comments ?Patient does have mild swelling of the right ankle at this time due to ankle injury ?  ? ? ? ? ?ASSESSMENT/PLAN:  ? ?Right ankle injury ?History and physical exam consistent with muscle strain or ligament sprain of the right ankle.  Tenderness to palpation along the anterior aspect of the distal tibia.  We will treat conservatively at this time with Tylenol, ice, heat, rest.  Also sent prescription for diclofenac gel.  Provided strict return precautions.   No further questions or concerns. ?  ? ? ?Gifford Shave, MD ?[place]  ? ?

## 2021-05-31 ENCOUNTER — Encounter: Payer: Self-pay | Admitting: Pharmacist

## 2021-05-31 ENCOUNTER — Ambulatory Visit (INDEPENDENT_AMBULATORY_CARE_PROVIDER_SITE_OTHER): Payer: Medicare Other | Admitting: Pharmacist

## 2021-05-31 DIAGNOSIS — E11319 Type 2 diabetes mellitus with unspecified diabetic retinopathy without macular edema: Secondary | ICD-10-CM | POA: Diagnosis not present

## 2021-05-31 NOTE — Assessment & Plan Note (Signed)
Diabetes longstanding with diagnosis > 10 years ago. Currently ready to initiate Ozempic (Semaglutide) injection. . Patient is able to verbalize appropriate hypoglycemia management plan.  ?-Initiated a trial of GLP-1 Ozempic  (semaglutide)) at 0.'25mg'$  once weekly. Following instruction and practice with demo device, patient able to demonstrate appropriate technique for injection and gave first dose of medication.  ?-Continued metformi'1000mg'$  BID.  ?-Patient educated on purpose, proper use, and potential adverse effects of GI/ Nausea.  ?-Extensively discussed pathophysiology of diabetes, recommended lifestyle interventions, dietary effects on blood sugar control.  ?

## 2021-05-31 NOTE — Progress Notes (Signed)
? ? ?  S:    ? ?Chief Complaint  ?Patient presents with  ? Medication Management  ?  Ozempic Initiation/ Injection Teaching  ? ?[name] is a 61 y.o. female who presents for diabetes evaluation, education, and management. She arrives with Ozempic (semaglutide) injection.  She has reservations about injections but is willing to perform once weekly injections.  ? ?Patient was referred and last seen by Primary Care Provider, Dr. Caron Lawson, on 05/21/2021.  ?Today, patient arrives in good spirits and presents without assistance.  ? ? ?Current diabetes medications include: metformin '1000mg'$  BID ? ?Patient reports taking all medications as prescribed. Patient reports adherence with medications.  ?Patient denies hypoglycemic events. ? ? ?O:  ?Physical Exam ?Constitutional:   ?   Appearance: Normal appearance. She is normal weight.  ?Pulmonary:  ?   Effort: Pulmonary effort is normal.  ?Neurological:  ?   Mental Status: She is alert.  ?Psychiatric:     ?   Mood and Affect: Mood normal.  ? ? ?Review of Systems  ?All other systems reviewed and are negative. ? ? ?Lab Results  ?Component Value Date  ? HGBA1C 6.9 05/08/2021  ? ?Vitals:  ? 05/31/21 0849  ?BP: (!) 140/59  ?Pulse: 77  ?SpO2: 100%  ? ? ?Lipid Panel  ?   ?Component Value Date/Time  ? CHOL 220 (H) 01/08/2021 1608  ? TRIG 273 (H) 01/08/2021 1608  ? HDL 62 01/08/2021 1608  ? CHOLHDL 3.5 01/08/2021 1608  ? CHOLHDL 4.0 02/11/2014 0915  ? VLDL 67 (H) 02/11/2014 0915  ? LDLCALC 111 (H) 01/08/2021 1608  ? LDLDIRECT 124 (H) 04/24/2017 1411  ? ? ? ?A/P: ?Diabetes longstanding with diagnosis > 10 years ago. Currently ready to initiate Ozempic (Semaglutide) injection. . Patient is able to verbalize appropriate hypoglycemia management plan.  ?-Initiated a trial of GLP-1 Ozempic  (semaglutide)) at 0.'25mg'$  once weekly. Following instruction and practice with demo device, patient able to demonstrate appropriate technique for injection and gave first dose of medication.  ?-Continued  metformi'1000mg'$  BID.  ?-Patient educated on purpose, proper use, and potential adverse effects of GI/ Nausea.  ?-Extensively discussed pathophysiology of diabetes, recommended lifestyle interventions, dietary effects on blood sugar control.  ?-Counseled on s/sx of and management of hypoglycemia.  ? ?Written patient instructions provided. Patient verbalized understanding of treatment plan. Total time in face to face counseling 22 minutes.   ? ?Follow up pharmacist or PCP clinic visit in 1 month. Patient seen with [name], PharmD Candidate, and [name], PharmD, PGY2 Pharmacy Resident.  ?. ? ?

## 2021-05-31 NOTE — Progress Notes (Signed)
Reviewed: I agree with Dr. Koval's documentation and management. 

## 2021-05-31 NOTE — Patient Instructions (Signed)
Nice to see you today! ? ?Today we went over how to use your Ozempic pen. You will be injecting 0.25 mg weekly starting next week since we did your first dose in clinic. You will want to inject on the same day each week.  ? ?Please call us if you have any questions come up! ?

## 2021-05-31 NOTE — Assessment & Plan Note (Signed)
>>  ASSESSMENT AND PLAN FOR DM (DIABETES MELLITUS) WITH COMPLICATIONS (HCC) WRITTEN ON 05/31/2021  1:46 PM BY KOVAL, PETER G, RPH-CPP  Diabetes longstanding with diagnosis > 10 years ago. Currently ready to initiate Ozempic  (Semaglutide ) injection. . Patient is able to verbalize appropriate hypoglycemia management plan.  -Initiated a trial of GLP-1 Ozempic   (semaglutide )) at 0.25mg  once weekly. Following instruction and practice with demo device, patient able to demonstrate appropriate technique for injection and gave first dose of medication.  -Continued metformi1000mg  BID.  -Patient educated on purpose, proper use, and potential adverse effects of GI/ Nausea.  -Extensively discussed pathophysiology of diabetes, recommended lifestyle interventions, dietary effects on blood sugar control.

## 2021-06-05 ENCOUNTER — Other Ambulatory Visit: Payer: Self-pay | Admitting: Family Medicine

## 2021-06-05 DIAGNOSIS — E11319 Type 2 diabetes mellitus with unspecified diabetic retinopathy without macular edema: Secondary | ICD-10-CM

## 2021-06-18 ENCOUNTER — Other Ambulatory Visit: Payer: Self-pay | Admitting: *Deleted

## 2021-06-18 DIAGNOSIS — I10 Essential (primary) hypertension: Secondary | ICD-10-CM

## 2021-06-18 MED ORDER — LISINOPRIL 40 MG PO TABS: 40.0000 mg | ORAL_TABLET | Freq: Every day | ORAL | 0 refills | Status: DC

## 2021-07-10 ENCOUNTER — Encounter: Payer: Self-pay | Admitting: *Deleted

## 2021-07-16 ENCOUNTER — Ambulatory Visit
Admission: RE | Admit: 2021-07-16 | Discharge: 2021-07-16 | Disposition: A | Discharge status: Home / Self Care | Payer: Medicare Other | Source: Ambulatory Visit | Attending: Family Medicine | Admitting: Family Medicine

## 2021-07-16 DIAGNOSIS — Z1231 Encounter for screening mammogram for malignant neoplasm of breast: Secondary | ICD-10-CM

## 2021-07-20 DIAGNOSIS — D164 Benign neoplasm of bones of skull and face: Secondary | ICD-10-CM | POA: Diagnosis not present

## 2021-07-20 DIAGNOSIS — H6122 Impacted cerumen, left ear: Secondary | ICD-10-CM | POA: Diagnosis not present

## 2021-08-24 ENCOUNTER — Telehealth: Payer: Self-pay

## 2021-08-24 NOTE — Telephone Encounter (Signed)
Patient calls nurse line regarding confusion with metformin prescription.   Patient states that she was dispensed a combination medication with metformin. Called pharmacy. Pharmacist states that they dispensed metformin 500 mg tablets.   Attempted to return call to patient. No answer, LVM asking patient to return call.   Sent mychart message as well.   Talbot Grumbling, RN

## 2021-09-13 ENCOUNTER — Other Ambulatory Visit: Payer: Self-pay | Admitting: *Deleted

## 2021-09-13 DIAGNOSIS — E11319 Type 2 diabetes mellitus with unspecified diabetic retinopathy without macular edema: Secondary | ICD-10-CM

## 2021-09-13 MED ORDER — ONETOUCH VERIO VI STRP: ORAL_STRIP | 0 refills | Status: DC

## 2021-09-14 DIAGNOSIS — H25813 Combined forms of age-related cataract, bilateral: Secondary | ICD-10-CM | POA: Diagnosis not present

## 2021-09-14 DIAGNOSIS — H43811 Vitreous degeneration, right eye: Secondary | ICD-10-CM | POA: Diagnosis not present

## 2021-09-19 ENCOUNTER — Ambulatory Visit: Payer: Medicare Other | Admitting: Student

## 2021-10-02 NOTE — Progress Notes (Unsigned)
    SUBJECTIVE:   CHIEF COMPLAINT / HPI:   Diabetes Patient's current diabetic medications include metformin and Ozempic. Tolerating well without side effects.  Patient endorses compliance with these medications. CBG readings averaging in the 119 range.  Patient's last A1c was 6.9 Denies abdominal pain, blurred vision, polyuria, polydipsia, hypoglycemia. Patient states they understand that diet and exercise can help with her diabetes.   Bilateral LE edema Patient reports chronic leg swelling over the years.  She reports noticing the swelling on her legs are worse in the evening. No new medication. Denies any pain in her legs but was told she has tendonitis of the feet by her foot doctor.  Annual foot and eye exam is up to date   PERTINENT  PMH / PSH: T2DM  OBJECTIVE:   BP 136/74   Pulse 85   Ht '5\' 5"'$  (1.651 m)   Wt 171 lb (77.6 kg)   SpO2 98%   BMI 28.46 kg/m    Physical Exam General: Alert, well appearing, NAD Cardiovascular: RRR, No Murmurs, Normal S2/S2 Respiratory: Breath sounds bilaterally, normal work of breathing on RA Extremities: +1 BLE edema   ASSESSMENT/PLAN:   Diabetes (HCC) A1c today is improved to 6.0.  Diabetes is well controlled with good tolerance to metformin and Ozempic. -Recommend patient continues current medication -Obtain labs for lipid panel and BMP.   Bilateral LE Edema Patient's history and exam findings are concerning for possible venous insufficiency. -Recommend compression socks -Advised keeping leg elevated in the evenings -Continue hydrochlorothiazide. -Obtained BMP  Follow-up in 2 weeks to discuss sciatica and handicap form   Alen Bleacher, MD [place]

## 2021-10-03 ENCOUNTER — Ambulatory Visit (INDEPENDENT_AMBULATORY_CARE_PROVIDER_SITE_OTHER): Payer: Medicare Other | Admitting: Student

## 2021-10-03 ENCOUNTER — Encounter: Payer: Self-pay | Admitting: Student

## 2021-10-03 VITALS — BP 136/74 | HR 85 | Ht 65.0 in | Wt 171.0 lb

## 2021-10-03 DIAGNOSIS — E11319 Type 2 diabetes mellitus with unspecified diabetic retinopathy without macular edema: Secondary | ICD-10-CM

## 2021-10-03 DIAGNOSIS — R600 Localized edema: Secondary | ICD-10-CM | POA: Diagnosis not present

## 2021-10-03 LAB — POCT GLYCOSYLATED HEMOGLOBIN (HGB A1C): HbA1c, POC (controlled diabetic range): 6 % (ref 0.0–7.0)

## 2021-10-03 NOTE — Patient Instructions (Addendum)
It was wonderful to meet you today. Thank you for allowing me to be a part of your care. Below is a short summary of what we discussed at your visit today:  Your A1c today was 6.0  Will obtain lab for your cholesterol and electrolyte   Follow up in 2 weeks to discuss your sciatica and handicap form  You can get the Shingrix vaccine at the pharmacy. You will need a 2nd shot 2-6 months after the first. This vaccine is important to help prevent shingles.    Please bring all of your medications to every appointment!  If you have any questions or concerns, please do not hesitate to contact us via phone or MyChart message.   Alen Bleacher, MD [facility]

## 2021-10-03 NOTE — Assessment & Plan Note (Signed)
A1c today is improved to 6.0.  Diabetes is well controlled with good tolerance to metformin and Ozempic. -Recommend patient continues current medication -Obtain labs for lipid panel and BMP.

## 2021-10-03 NOTE — Assessment & Plan Note (Signed)
>>  ASSESSMENT AND PLAN FOR DM (DIABETES MELLITUS) WITH COMPLICATIONS (HCC) WRITTEN ON 10/03/2021  4:50 PM BY ROSENDO RUSH, MD  A1c today is improved to 6.0.  Diabetes is well controlled with good tolerance to metformin  and Ozempic . -Recommend patient continues current medication -Obtain labs for lipid panel and BMP.

## 2021-10-04 LAB — BASIC METABOLIC PANEL
BUN/Creatinine Ratio: 15 (ref 12–28)
BUN: 13 mg/dL (ref 8–27)
CO2: 24 mmol/L (ref 20–29)
Calcium: 10.2 mg/dL (ref 8.7–10.3)
Chloride: 99 mmol/L (ref 96–106)
Creatinine, Ser: 0.88 mg/dL (ref 0.57–1.00)
Glucose: 98 mg/dL (ref 70–99)
Potassium: 4.3 mmol/L (ref 3.5–5.2)
Sodium: 141 mmol/L (ref 134–144)
eGFR: 75 mL/min/{1.73_m2} (ref >59)

## 2021-10-04 LAB — LIPID PANEL
Chol/HDL Ratio: 2.9 ratio (ref 0.0–4.4)
Cholesterol, Total: 195 mg/dL (ref 100–199)
HDL: 67 mg/dL (ref >39)
LDL Chol Calc (NIH): 103 mg/dL — ABNORMAL HIGH (ref 0–99)
Triglycerides: 146 mg/dL (ref 0–149)
VLDL Cholesterol Cal: 25 mg/dL (ref 5–40)

## 2021-10-25 ENCOUNTER — Encounter: Payer: Self-pay | Admitting: Student

## 2021-10-25 ENCOUNTER — Ambulatory Visit (INDEPENDENT_AMBULATORY_CARE_PROVIDER_SITE_OTHER): Payer: Medicare Other | Admitting: Student

## 2021-10-25 VITALS — BP 146/59 | HR 92 | Temp 98.3°F | Wt 173.2 lb

## 2021-10-25 DIAGNOSIS — H6122 Impacted cerumen, left ear: Secondary | ICD-10-CM

## 2021-10-25 DIAGNOSIS — J302 Other seasonal allergic rhinitis: Secondary | ICD-10-CM

## 2021-10-25 MED ORDER — FLUTICASONE PROPIONATE 50 MCG/ACT NA SUSP: 2.0000 | Freq: Every day | NASAL | 6 refills | Status: DC

## 2021-10-25 MED ORDER — CETIRIZINE HCL 10 MG PO TABS: 10.0000 mg | ORAL_TABLET | Freq: Every day | ORAL | 11 refills | Status: DC

## 2021-10-25 NOTE — Assessment & Plan Note (Signed)
Patient had a significant cerumen burden in her left ear canal and reported immediate improvement in left ear discomfort after disimpaction in the clinic.  It is likely she will need lifelong frequent cerumen disimpaction on the left side given her osteoma and tendency of cerumen to build up surrounding the growth.  It appears that her osteoma compresses her cerumen in ear canal and may been be causing some pain in this way.  She is being seen by otolaryngology yearly for follow-up of osteoma.

## 2021-10-25 NOTE — Patient Instructions (Addendum)
[name],  It is such a joy to meet you!  I am glad that we could get some of that wax out of your ear.  I am sorry that you have been having the symptoms, I think that allergies seem to be driving most of what is going on.  I think that the issue with your ear is secondary to your osteoma and is going to be a recurrent issue.  I am glad that you see your ENT frequently as I think you are probably going to need regular cerumen disimpactions indefinitely. For your allergies, I like you to start taking Zyrtec 10 mg daily for the next 2 weeks.  You can keep this on hand and take it for about 2 weeks at a time whenever you have allergy symptoms. I am also sending some nasal spray called Flonase that you can spray into your nose every day. 2 sprays in each nostril.   Pearla Dubonnet, MD

## 2021-10-25 NOTE — Assessment & Plan Note (Addendum)
Given the patient's rhinorrhea and eye discomfort along with postnasal drainage possibly associated with seasonal changes, it is likely that she is experiencing some seasonal allergies.  It is possible that her allergies have contributed to her ear pain and throat soreness given the possibility of some eustachian tube dysfunction, particularly in the setting of her osteoma.  Prescribed patient Zyrtec 10 mg daily to be taken for at least 2 weeks when experiencing symptoms and daily Flonase.

## 2021-10-25 NOTE — Progress Notes (Addendum)
SUBJECTIVE:   CHIEF COMPLAINT / HPI: [name] is a 61 y.o. female with a past medical history of left ear osteoma, frequent cerumen impaction, ITP, HLD, and HTN presenting to the clinic for ear pain and throat pain.   Sore throat/ear pain Patient reports that she has had 2 days of sore throat associated with left ear pain.  During this time.  She is also and has had some adduction of phlegm early in the morning.  She denies any cough, fever, nausea/vomiting/diarrhea, hemoptysis, heartburn, or shortness of breath.  She has not had any abdominal pain or any other discomfort elsewhere in her body.  She has chronic lower extremity edema when standing for long periods of time.  She denies any sick contacts recently does not have any young children in her house.  Has been taking Tylenol at home without any improvement in her symptoms.  She endorses having a runny nose in the past few weeks and notes that she has noticed similar symptoms in the past surrounding spring and fall seasonal changes.  She feels she has had some puffiness in her eyes in the mornings.  The patient has never used any antihistamines or allergy medications and is not aware of having any environmental allergies.  She saw her otolaryngologist on 07/2021 for disimpaction of cerumen in her left ear.   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 She does not drink alcohol. She lives at home with her adult children,  OBJECTIVE:   BP (!) 146/59   Pulse 92   Temp 98.3 F (36.8 C) (Oral)   Wt 173 lb 3.2 oz (78.6 kg)   SpO2 94%   BMI 28.82 kg/m   General: Age-appropriate, resting comfortably in chair HEENT:  Head: normocephalic, atraumatic Eyes: clear conjunctiva without injections. Ears: clear right external canal and non-bulging right tympanic membrane. Impacted cerumen in left  external canal and partially obstructive anterior osteoma limiting visibility of left tympanic membrane. No erythema or drainage visible bilaterally. Nose: no swelling or erythema Mouth/Oral: clear and non-erythematous oropharynx Neck: supple, no LAD Cardiovascular: Regular rate and rhythm. Normal S1/S2. No murmurs, rubs, or gallops appreciated. 2+ radial pulses. Pulmonary: Clear bilaterally to ascultation. No increased WOB, no accessory muscle usage. No wheezes, rales, or crackles. Skin: Warm and dry. Extremities: No peripheral edema bilaterally. No cyanosis or clubbing.   ASSESSMENT/PLAN:  [name] is a 61 y.o. female with a past medical history of left ear osteoma, frequent cerumen impaction, ITP, HLD, and HTN presenting to the clinic with a left ear cerumen impaction and symptoms concerning for seasonal allergies.  Seasonal allergies Given the patient's rhinorrhea and eye discomfort along with postnasal drainage possibly associated with seasonal changes, it is likely that she is experiencing some seasonal allergies.  It is possible that her allergies have contributed to her ear pain and throat soreness given the possibility of some eustachian tube dysfunction, particularly in the setting of her osteoma.  Prescribed patient Zyrtec 10 mg daily to be taken for at least 2 weeks when experiencing symptoms and daily Flonase.  Cerumen impaction Patient had a significant cerumen burden in her left ear canal and reported immediate improvement in left ear discomfort after disimpaction in the clinic.  It is likely she will need lifelong frequent cerumen disimpaction on the left side given her osteoma and tendency of  cerumen to build up surrounding the growth.  It appears that her osteoma compresses her cerumen in ear canal and may been be causing some pain in this way.  She is being seen by otolaryngology yearly for follow-up of osteoma.   [name] Mining engineer, [place]     I have evaluated this patient along with Student Dr. Katy Fitch and reviewed the above note, making necessary revisions.  Pearla Dubonnet, MD 10/26/2021, 7:21 AM PGY-2, [place]

## 2021-10-26 DIAGNOSIS — H43811 Vitreous degeneration, right eye: Secondary | ICD-10-CM | POA: Diagnosis not present

## 2021-10-26 DIAGNOSIS — H40013 Open angle with borderline findings, low risk, bilateral: Secondary | ICD-10-CM | POA: Diagnosis not present

## 2021-10-26 DIAGNOSIS — H04123 Dry eye syndrome of bilateral lacrimal glands: Secondary | ICD-10-CM | POA: Diagnosis not present

## 2021-10-26 DIAGNOSIS — E119 Type 2 diabetes mellitus without complications: Secondary | ICD-10-CM | POA: Diagnosis not present

## 2021-10-26 DIAGNOSIS — H25813 Combined forms of age-related cataract, bilateral: Secondary | ICD-10-CM | POA: Diagnosis not present

## 2021-11-06 ENCOUNTER — Ambulatory Visit (INDEPENDENT_AMBULATORY_CARE_PROVIDER_SITE_OTHER): Payer: Medicare Other | Admitting: Student

## 2021-11-06 ENCOUNTER — Encounter: Payer: Self-pay | Admitting: Student

## 2021-11-06 VITALS — BP 138/84 | HR 101 | Ht 65.0 in | Wt 172.0 lb

## 2021-11-06 DIAGNOSIS — Z23 Encounter for immunization: Secondary | ICD-10-CM | POA: Diagnosis not present

## 2021-11-06 DIAGNOSIS — M5432 Sciatica, left side: Secondary | ICD-10-CM

## 2021-11-06 MED ORDER — DULOXETINE HCL 30 MG PO CPEP: 30.0000 mg | ORAL_CAPSULE | Freq: Every day | ORAL | 2 refills | Status: DC

## 2021-11-06 NOTE — Assessment & Plan Note (Addendum)
Patient with well-documented history of sciatica presents with right buttocks and lower back pain radiating to the lower extremity.  Denies red flag symptoms such as saddle paresthesia or incontinence.  Reported poor tolerance to Neurontin in the past with vivd dreams and given history of thrombocytopenia she would not be a candidate for Lyrica.   - Started patient on duloxetine 30 mg daily -Placed referral for physical therapy -Completed form for handicap placard

## 2021-11-06 NOTE — Progress Notes (Cosign Needed Addendum)
    SUBJECTIVE:   CHIEF COMPLAINT / HPI:   Patient is a 61 year old female who presented today for follow-up for sciatica. Patient report back pain that goes down from her right buttocks to her right feet It is more of a stabbing pain that's worse with ambulation And sometimes at night will have aching pain on her lower extremity No prior trauma to the back. Denies any incontinence or saddle paresthesia  Previously tried neurotin but stopped due to vivid dreams.  PERTINENT  PMH / PSH: Sciatica, ITP, hyperparathyroidism  OBJECTIVE:   BP 138/84   Pulse (!) 101   Ht '5\' 5"'$  (1.651 m)   Wt 172 lb (78 kg)   SpO2 98%   BMI 28.62 kg/m    Physical Exam General: Alert, well appearing, NAD Cardiovascular: RRR, No Murmurs, Normal S2/S2 Respiratory: CTAB, No wheezing or Rales Abdomen: No distension or tenderness Extremities: Muscle strength is 5 out of 5 on BLE with sensation intact.  No edema on extremities     ASSESSMENT/PLAN:   Sciatica of left side Patient with well-documented history of sciatica presents with right buttocks and lower back pain radiating to the lower extremity.  Denies red flag symptoms such as saddle paresthesia or incontinence.  Reported poor tolerance to Neurontin in the past with vivd dreams and given history of thrombocytopenia she would not be a candidate for Lyrica.   - Started patient on duloxetine 30 mg daily -Placed referral for physical therapy -Completed form for handicap placard   HCM - Patient received flu vaccine today.  Alen Bleacher, MD [place]

## 2021-11-06 NOTE — Patient Instructions (Signed)
It was wonderful to see you today. Thank you for allowing me to be a part of your care. Below is a short summary of what we discussed at your visit today:  We have completed your form for the handicap placard.  Placed referral to physical therapy and also you are to take duloxetine 30 mg daily for your neuropathic pain.  Your flu shot was administer today.  Please bring all of your medications to every appointment!  If you have any questions or concerns, please do not hesitate to contact us via phone or MyChart message.   Alen Bleacher, MD [facility]

## 2021-11-07 ENCOUNTER — Other Ambulatory Visit: Payer: Self-pay | Admitting: Family Medicine

## 2021-11-07 DIAGNOSIS — I10 Essential (primary) hypertension: Secondary | ICD-10-CM

## 2021-11-13 ENCOUNTER — Other Ambulatory Visit: Payer: Self-pay | Admitting: Family Medicine

## 2021-11-13 DIAGNOSIS — I10 Essential (primary) hypertension: Secondary | ICD-10-CM

## 2021-11-23 ENCOUNTER — Ambulatory Visit: Payer: Medicare Other | Admitting: Student

## 2021-12-04 ENCOUNTER — Ambulatory Visit (INDEPENDENT_AMBULATORY_CARE_PROVIDER_SITE_OTHER): Payer: Medicare Other | Admitting: Student

## 2021-12-04 ENCOUNTER — Encounter: Payer: Self-pay | Admitting: Student

## 2021-12-04 VITALS — BP 131/84 | HR 93 | Wt 170.0 lb

## 2021-12-04 DIAGNOSIS — M5432 Sciatica, left side: Secondary | ICD-10-CM | POA: Diagnosis not present

## 2021-12-04 DIAGNOSIS — K5900 Constipation, unspecified: Secondary | ICD-10-CM | POA: Diagnosis not present

## 2021-12-04 DIAGNOSIS — E11319 Type 2 diabetes mellitus with unspecified diabetic retinopathy without macular edema: Secondary | ICD-10-CM | POA: Diagnosis not present

## 2021-12-04 MED ORDER — POLYETHYLENE GLYCOL 3350 17 GM/SCOOP PO POWD: 17.0000 g | Freq: Two times a day (BID) | ORAL | 0 refills | Status: DC | PRN

## 2021-12-04 MED ORDER — ONETOUCH VERIO REFLECT W/DEVICE KIT: PACK | 0 refills | Status: DC

## 2021-12-04 NOTE — Patient Instructions (Addendum)
It was wonderful to meet you today. Thank you for allowing me to be a part of your care. Below is a short summary of what we discussed at your visit today:  Constipation is likely due to Ozempic.  I have placed order for MiraLAX which you will take as needed.  Also you can try over-the-counter prune juice or Metamucil.  You received your COVID-19 booster today.  Please bring all of your medications to every appointment!  If you have any questions or concerns, please do not hesitate to contact us via phone or MyChart message.   Alen Bleacher, MD [facility]

## 2021-12-04 NOTE — Progress Notes (Signed)
    SUBJECTIVE:   CHIEF COMPLAINT / HPI:   Patient is a 61 year old female who presented with constipation Patient reports this has been going on for 7days Last bowel movement was this morning and normal Denies abdominal pain, nausea, vomiting. Reports flatulence during this time. Denies any history of transient abdominal surgeries, other than hysterectomy in 1996 She has tried over-the-counter laxative and had a BM this morning   PERTINENT  PMH / PSH: Reviewed  OBJECTIVE:   BP 131/84   Pulse 93   Wt 170 lb (77.1 kg)   SpO2 98%   BMI 28.29 kg/m    Physical Exam General: Alert, well appearing, NAD Cardiovascular: RRR, No Murmurs, Normal S2/S2 Respiratory: CTAB, No wheezing or Rales Abdomen: No distension or tenderness.  Extremities: No edema on extremities    ASSESSMENT/PLAN:  Constipation Presented today due to constipation for the past 7 days.  Her first BM today with over-the-counter laxative. Denies any abdominal pain, nausea,or vomiting but reports flatulence.  Home medication include Ozempic which could attribute to her constipation.  Patient would like to continue her Ozempic since her constipation is tolerable. -Rx as needed MiraLAX -Encourage patient to try prune juice or over-the-counter Metamucil -Discussed side effects of Ozempic -Reviewed return precautions  Sciatica of left side Patient with history of sciatica who was recently started on duloxetine 2 weeks ago.  She says she has not started the medication due to concerns for her liver.  Still endorses intermittent paresthesia of the lower extremity.  Denies any saddle paresthesia or incontinence. -Advised patient to start her duloxetine -Follow-up with her physical therapy which is scheduled for next week -Follow-up in 2 weeks to check CMP   Alen Bleacher, MD [place]

## 2021-12-04 NOTE — Assessment & Plan Note (Signed)
Patient with history of sciatica who was recently started on duloxetine 2 weeks ago.  She says she has not started the medication due to concerns for her liver.  Still endorses intermittent paresthesia of the lower extremity.  Denies any saddle paresthesia or incontinence. -Advised patient to start her duloxetine -Follow-up with her physical therapy which is scheduled for next week -Follow-up in 2 weeks to check CMP

## 2021-12-06 NOTE — Therapy (Incomplete)
OUTPATIENT PHYSICAL THERAPY THORACOLUMBAR EVALUATION   Patient Name: [name] MRN: [id number] DOB:20-Oct-1960, 61 y.o., female Today's Date: 12/06/2021    Past Medical History:  Diagnosis Date   Arthritis    Breast CA ([place])    breast CA- right breast   Diabetes mellitus    Hyperlipidemia    Hypertension    Thrombocytopenia ([place])    Thyroid disease    parathyroid removed   Past Surgical History:  Procedure Laterality Date   ABDOMINAL HYSTERECTOMY     1996 per pt   BREAST BIOPSY Right 06/2009   BREAST SURGERY  09/12/10   excision of skin mass - right breast, had another surgery to remove more Cancer cells   INNER EAR SURGERY  06/01/13   MASTECTOMY Right 07/21/2009   PARATHYROIDECTOMY     partial?   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 02/17/2015   Cataracts, bilateral 09/13/2014   Glaucoma 09/13/2014   Mitral regurgitation 02/18/2014   Hip pain 01/07/2014   Cerumen impaction 01/06/2013   Transaminitis 11/13/2012   Hyperlipidemia 04/04/2011   Pedal edema 04/04/2011   Diabetes ([place]) 06/07/2010   Cancer of central portion of right female breast ([place]) 10/04/2009   Idiopathic thrombocytopenic purpura (ITP) ([place]) 06/22/2009   Essential hypertension 06/22/2009   HYPERPARATHYROIDISM, HX OF 06/22/2009   Avitaminosis D 09/01/2008    PCP: Alen Bleacher, MD   REFERRING PROVIDER: Alen Bleacher, MD (resident) Leeanne Rio, MD   REFERRING DIAG: [phone]  (ICD-10-CM) - Sciatica of left side   Rationale for Evaluation and Treatment: Rehabilitation  THERAPY DIAG:  No diagnosis found.  ONSET DATE: ***  SUBJECTIVE:                                                                                                                                                                                           SUBJECTIVE STATEMENT: ***  PERTINENT HISTORY:  ***  PAIN:  Are you having pain? {OPRCPAIN:27236}  PRECAUTIONS: {Therapy precautions:24002}  WEIGHT BEARING RESTRICTIONS: {Yes ***/No:24003}  FALLS:  Has patient fallen in last 6 months? {fallsyesno:27318}  LIVING ENVIRONMENT: Lives with: {[facility]RC lives with:25569::"lives  with their family"} Lives in: {Lives in:25570} Stairs: {opstairs:27293} Has following equipment at home: {Assistive devices:23999}  OCCUPATION: ***  PLOF: {PLOF:24004}  PATIENT GOALS: ***   OBJECTIVE:   DIAGNOSTIC FINDINGS:  ***  PATIENT SURVEYS:  {rehab surveys:24030}  SCREENING FOR RED FLAGS: Bowel or bladder incontinence: {Yes/No:304960894} Spinal tumors: {Yes/No:304960894} Cauda equina syndrome: {Yes/No:304960894} Compression fracture: {Yes/No:304960894} Abdominal aneurysm: {Yes/No:304960894}  COGNITION: Overall cognitive status: {cognition:24006}     SENSATION: {sensation:27233}  MUSCLE LENGTH: Hamstrings: Right *** deg; Left *** deg Thomas test: Right *** deg; Left *** deg  POSTURE: {posture:25561}  PALPATION: ***  LUMBAR ROM:   AROM eval  Flexion   Extension   Right lateral flexion   Left lateral flexion   Right rotation   Left rotation    (Blank rows = not tested)  LOWER EXTREMITY ROM:     {AROM/PROM:27142}  Right eval Left eval  Hip flexion    Hip extension    Hip abduction    Hip adduction    Hip internal rotation    Hip external rotation    Knee flexion    Knee extension    Ankle dorsiflexion    Ankle plantarflexion    Ankle inversion    Ankle eversion      (Blank rows = not tested)  LOWER EXTREMITY MMT:    MMT Right eval Left eval  Hip flexion    Hip extension    Hip abduction    Hip adduction    Hip internal rotation    Hip external rotation    Knee flexion    Knee extension    Ankle dorsiflexion    Ankle plantarflexion    Ankle inversion    Ankle eversion     (Blank rows = not tested)  LUMBAR SPECIAL TESTS:  {lumbar special test:25242}  FUNCTIONAL TESTS:  {Functional tests:24029}  GAIT: Distance walked: *** Assistive device utilized: {Assistive devices:23999} Level of assistance: {Levels of assistance:24026} Comments: ***  TODAY'S TREATMENT:                                                                                                                              DATE: ***    PATIENT EDUCATION:  Education details: *** Person educated: {Person educated:25204} Education method: {Education Method:25205} Education comprehension: {Education Comprehension:25206}  HOME EXERCISE PROGRAM: ***  ASSESSMENT:  CLINICAL IMPRESSION: Patient is a *** y.o. *** who was seen today for physical therapy evaluation and treatment for ***.   OBJECTIVE IMPAIRMENTS: {opptimpairments:25111}.   ACTIVITY LIMITATIONS: {activitylimitations:27494}  PARTICIPATION LIMITATIONS: {participationrestrictions:25113}  PERSONAL FACTORS: {Personal factors:25162} are also affecting patient's functional outcome.   REHAB POTENTIAL: {rehabpotential:25112}  CLINICAL DECISION MAKING: {clinical decision making:25114}  EVALUATION COMPLEXITY: {Evaluation complexity:25115}   GOALS: Goals reviewed with patient? {yes/no:20286}  SHORT TERM GOALS: Target date: {follow up:25551}  *** Baseline: Goal status: {GOALSTATUS:25110}  2.  *** Baseline:  Goal status: {GOALSTATUS:25110}  3.  *** Baseline:  Goal status: {GOALSTATUS:25110}  4.  ***  Baseline:  Goal status: {GOALSTATUS:25110}  5.  *** Baseline:  Goal status: {GOALSTATUS:25110}  6.   *** Baseline:  Goal status: {GOALSTATUS:25110}  LONG TERM GOALS: Target date: {follow up:25551}  *** Baseline:  Goal status: {GOALSTATUS:25110}  2.  *** Baseline:  Goal status: {GOALSTATUS:25110}  3.  *** Baseline:  Goal status: {GOALSTATUS:25110}  4.  *** Baseline:  Goal status: {GOALSTATUS:25110}  5.  *** Baseline:  Goal status: {GOALSTATUS:25110}  6.  *** Baseline:  Goal status: {GOALSTATUS:25110}  PLAN:  PT FREQUENCY: {rehab frequency:25116}  PT DURATION: {rehab duration:25117}  PLANNED INTERVENTIONS: {rehab planned interventions:25118::"Therapeutic exercises","Therapeutic activity","Neuromuscular re-education","Balance training","Gait training","Patient/Family education","Self Care","Joint mobilization"}.  PLAN FOR NEXT SESSION: ***   ***

## 2021-12-10 ENCOUNTER — Other Ambulatory Visit: Payer: Self-pay | Admitting: Student

## 2021-12-10 DIAGNOSIS — E11319 Type 2 diabetes mellitus with unspecified diabetic retinopathy without macular edema: Secondary | ICD-10-CM

## 2021-12-11 ENCOUNTER — Ambulatory Visit: Payer: Medicare Other | Admitting: Physical Therapy

## 2021-12-25 ENCOUNTER — Encounter: Payer: Self-pay | Admitting: Student

## 2021-12-25 ENCOUNTER — Ambulatory Visit (INDEPENDENT_AMBULATORY_CARE_PROVIDER_SITE_OTHER): Payer: Medicare Other | Admitting: Student

## 2021-12-25 VITALS — BP 117/84 | HR 93 | Ht 65.0 in | Wt 174.2 lb

## 2021-12-25 DIAGNOSIS — M792 Neuralgia and neuritis, unspecified: Secondary | ICD-10-CM | POA: Diagnosis not present

## 2021-12-25 DIAGNOSIS — E11319 Type 2 diabetes mellitus with unspecified diabetic retinopathy without macular edema: Secondary | ICD-10-CM | POA: Diagnosis not present

## 2021-12-25 MED ORDER — ONETOUCH VERIO VI STRP: ORAL_STRIP | 0 refills | Status: DC

## 2021-12-25 MED ORDER — GABAPENTIN 300 MG PO CAPS: 300.0000 mg | ORAL_CAPSULE | Freq: Three times a day (TID) | ORAL | 0 refills | Status: DC

## 2021-12-25 NOTE — Progress Notes (Signed)
    SUBJECTIVE:   CHIEF COMPLAINT / HPI:   Patient 61 year old female who presents today for lab follow-up. She was seen about 3 weeks ago and started on duloxetine for neuropathic pain/sciatica. At the time patient was concerned on restarting the medication d/t concerns of hepatotoxicity with duloxetine. Informed patient we will recheck her liver function in 2 weeks after restarting the medication. Denies improvement of her nerve pain going down her RLE from the buttocks. Patient report she will like to switch back to gabapentin since she saw some improvement with it. She reports he is unsure if her previously repeated vivid dream was due to gabapentin.  She has only used the duloxetine twice and discontinued it. Patient reports she has not been able to start physical therapy as she has no head from them since the referral was placed.   Type 2 diabetes Patient is on Ozempic and metformin for diabetes. Last A1c 10/03/2021 was 6.0 Patient denies any symptoms of hypoglycemia or hypoglycemia. She is due for annual microalbuminuria test.  PERTINENT  PMH / PSH: Reviewed   OBJECTIVE:   BP 117/84   Pulse 93   Ht '5\' 5"'$  (1.651 m)   Wt 174 lb 3.2 oz (79 kg)   SpO2 98%   BMI 28.99 kg/m    Physical Exam General: Alert, well appearing, NAD Cardiovascular: RRR, No Murmurs, Normal S2/S2 Respiratory: CTAB, No wheezing or Rales Abdomen: No distension or tenderness Extremities: Muscle strength is 5 out of 5 with sensations intact. No notable deformity. Reports soreness with palpation around the right gluteal muscle.  ASSESSMENT/PLAN:   Diabetes [facility]) Patient with well controlled diabetes endorses compliance and tolerance to medication.  No concerns for hyperglycemia or hypoglycemia. -Collected sample for annual microalbuminuria test and refilled strips for her glucometer. -Collected lab for CMP   RLE Paresthesia Patient's right lower extremity paresthesia more consistent with piriformis  syndrome.  Denies any lower back pain.  No red flag symptoms she has saddle paresthesia continence. -Switch patient's medication from duloxetine to gabapentin 300 mg 3 times daily.Alen Bleacher, MD [place]

## 2021-12-25 NOTE — Assessment & Plan Note (Addendum)
Patient with well controlled diabetes endorses compliance and tolerance to medication.  No concerns for hyperglycemia or hypoglycemia. -Collected sample for annual microalbuminuria test and refilled strips for her glucometer. -Collected lab for CMP

## 2021-12-25 NOTE — Patient Instructions (Addendum)
It was wonderful to see you today. Thank you for allowing me to be a part of your care. Below is a short summary of what we discussed at your visit today:  For your numbness and tingling sensation in your right leg I have switched her medication from duloxetine to gabapentin.  You take the gabapentin 300 mg 3 times daily.  We are collecting urine sample to test your kidney function due to your diabetes.  I have also refilled your prescription for your diabetes which you can pick up at your pharmacy.  Also someone from our clinic will call you to provided the number for the physical therapy.  Please follow-up in a month.  Please bring all of your medications to every appointment!  If you have any questions or concerns, please do not hesitate to contact us via phone or MyChart message.   Alen Bleacher, MD [facility]

## 2021-12-25 NOTE — Assessment & Plan Note (Signed)
>>  ASSESSMENT AND PLAN FOR DM (DIABETES MELLITUS) WITH COMPLICATIONS ([facility]) WRITTEN ON 12/25/2021  5:20 PM BY ROSENDO RUSH, MD  Patient with well controlled diabetes endorses compliance and tolerance to medication.  No concerns for hyperglycemia or hypoglycemia. -Collected sample for annual microalbuminuria test and refilled strips for her glucometer. -Collected lab for CMP

## 2021-12-26 ENCOUNTER — Encounter: Payer: Self-pay | Admitting: *Deleted

## 2021-12-26 LAB — COMPREHENSIVE METABOLIC PANEL
ALT: 12 IU/L (ref 0–32)
AST: 17 IU/L (ref 0–40)
Albumin/Globulin Ratio: 1.6 (ref 1.2–2.2)
Albumin: 4.4 g/dL (ref 3.9–4.9)
Alkaline Phosphatase: 50 IU/L (ref 44–121)
BUN/Creatinine Ratio: 11 — ABNORMAL LOW (ref 12–28)
BUN: 9 mg/dL (ref 8–27)
Bilirubin Total: 0.2 mg/dL (ref 0.0–1.2)
CO2: 27 mmol/L (ref 20–29)
Calcium: 9.9 mg/dL (ref 8.7–10.3)
Chloride: 99 mmol/L (ref 96–106)
Creatinine, Ser: 0.8 mg/dL (ref 0.57–1.00)
Globulin, Total: 2.8 g/dL (ref 1.5–4.5)
Glucose: 101 mg/dL — ABNORMAL HIGH (ref 70–99)
Potassium: 3.8 mmol/L (ref 3.5–5.2)
Sodium: 142 mmol/L (ref 134–144)
Total Protein: 7.2 g/dL (ref 6.0–8.5)
eGFR: 84 mL/min/{1.73_m2} (ref >59)

## 2021-12-26 LAB — MICROALBUMIN / CREATININE URINE RATIO
Creatinine, Urine: 70.7 mg/dL
Microalb/Creat Ratio: <4 mg/g creat (ref 0–29)
Microalbumin, Urine: <3 ug/mL

## 2021-12-31 ENCOUNTER — Other Ambulatory Visit: Payer: Self-pay

## 2021-12-31 DIAGNOSIS — E11319 Type 2 diabetes mellitus with unspecified diabetic retinopathy without macular edema: Secondary | ICD-10-CM

## 2021-12-31 MED ORDER — ONETOUCH VERIO VI STRP: ORAL_STRIP | 0 refills | Status: DC

## 2021-12-31 NOTE — Telephone Encounter (Signed)
Fax from [facility] received. Pharmacy is requesting a new Rx with specific instructions to say how many times a day is pt testing.  Please resend Rx.   Ottis Stain, CMA

## 2022-02-03 ENCOUNTER — Other Ambulatory Visit: Payer: Self-pay | Admitting: Family Medicine

## 2022-02-03 DIAGNOSIS — E785 Hyperlipidemia, unspecified: Secondary | ICD-10-CM

## 2022-02-07 NOTE — Therapy (Incomplete)
OUTPATIENT PHYSICAL THERAPY THORACOLUMBAR EVALUATION   Patient Name: [name] MRN: [id number] DOB:09-27-1960, 62 y.o., female Today's Date: 02/07/2022  END OF SESSION:   Past Medical History:  Diagnosis Date   Arthritis    Breast CA ([place])    breast CA- right breast   Diabetes mellitus    Hyperlipidemia    Hypertension    Thrombocytopenia ([place])    Thyroid disease    parathyroid removed   Past Surgical History:  Procedure Laterality Date   ABDOMINAL HYSTERECTOMY     1996 per pt   BREAST BIOPSY Right 06/2009   BREAST SURGERY  09/12/10   excision of skin mass - right breast, had another surgery to remove more Cancer cells   INNER EAR SURGERY  06/01/13   MASTECTOMY Right 07/21/2009   PARATHYROIDECTOMY     partial?   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 02/17/2015   Cataracts, bilateral 09/13/2014   Glaucoma 09/13/2014   Mitral regurgitation 02/18/2014   Hip pain 01/07/2014   Cerumen impaction 01/06/2013   Transaminitis 11/13/2012   Hyperlipidemia 04/04/2011   Pedal edema 04/04/2011   Diabetes ([place]) 06/07/2010   Cancer of central portion of right female breast ([place]) 10/04/2009   Idiopathic thrombocytopenic purpura (ITP) ([place]) 06/22/2009   Essential hypertension 06/22/2009   HYPERPARATHYROIDISM, HX OF 06/22/2009   Avitaminosis D 09/01/2008    PCP: Alen Bleacher, MD  REFERRING PROVIDER: Leeanne Rio, MD  REFERRING DIAG: [phone] (ICD-10-CM) -  Sciatica of left side  Rationale for Evaluation and Treatment: Rehabilitation  THERAPY DIAG:  No diagnosis found.  ONSET DATE: ***  SUBJECTIVE:                                                                                                                                                                                           SUBJECTIVE STATEMENT: ***  PERTINENT HISTORY:  hx R sided breast cancer, DM, HLD, HTN  PAIN:  Are you having pain: *** Location: *** How would you describe your pain? *** Best in past week: *** Worst in past week: *** Aggravating factors: *** Easing factors: ***  PRECAUTIONS: hx R sided breast cancer  WEIGHT BEARING RESTRICTIONS: No  FALLS:  Has patient fallen in last 6 months? {fallsyesno:27318}  LIVING ENVIRONMENT: Lives with: {[facility] lives with:25569::"lives with their family"} Lives in: {Lives in:25570} Stairs: {opstairs:27293} Has following equipment at home: {Assistive devices:23999}  OCCUPATION: ***  PLOF: {PLOF:24004}  PATIENT GOALS: ***  NEXT MD VISIT:   OBJECTIVE:   DIAGNOSTIC FINDINGS:  ***  PATIENT SURVEYS:  {rehab surveys:24030}  SCREENING FOR RED FLAGS: Unremarkable red flag screening  COGNITION: Overall cognitive status: Within functional limits for tasks assessed     SENSATION: {sensation:27233}  MUSCLE LENGTH: Hamstrings: Right *** deg; Left *** deg Thomas test: Right *** deg; Left *** deg  POSTURE: {posture:25561}  PALPATION: ***  LUMBAR ROM:   AROM eval  Flexion   Extension   Right lateral flexion   Left lateral flexion   Right rotation   Left rotation    (Blank rows = not tested)  LOWER EXTREMITY ROM:     {AROM/PROM:27142}  Right eval Left eval  Hip flexion    Hip extension    Hip internal rotation    Hip external rotation    (Blank rows = not tested) (Key: WFL = within functional limits not formally assessed, * = concordant pain, s = stiffness/stretching sensation, NT = not  tested)  Comments:    LOWER EXTREMITY MMT:    MMT Right eval Left eval  Hip flexion    Hip abduction (modified sitting)    Hip internal rotation    Hip external rotation    Knee flexion    Knee extension     (Blank rows = not tested) (Key: WFL = within functional limits not formally assessed, * = concordant pain, s = stiffness/stretching sensation, NT = not tested)  Comments:    LUMBAR SPECIAL TESTS:  {lumbar special test:25242}  FUNCTIONAL TESTS:  {Functional tests:24029}  GAIT: Distance walked: within clinic Assistive device utilized: {Assistive devices:23999} Level of assistance: {Levels of assistance:24026} Comments: ***  TODAY'S TREATMENT:                                                                                                                            [facility] Adult PT Treatment:                                                DATE: 02/11/22 Therapeutic Exercise: *** Manual Therapy: *** Neuromuscular re-ed: *** Therapeutic Activity: *** Modalities: *** Self Care: ***   PATIENT EDUCATION:  Education details: Pt education on PT impairments, prognosis, and POC. Informed consent. Rationale for interventions, safe/appropriate HEP performance Person educated: Patient Education method: Explanation, Demonstration, Tactile cues, Verbal cues, and Handouts Education comprehension: verbalized understanding, returned demonstration, verbal cues required, tactile cues required, and needs further education    HOME EXERCISE PROGRAM: ***  ASSESSMENT:  CLINICAL IMPRESSION: Patient is a 62 y.o. woman who was seen today for physical therapy evaluation and treatment for chronic low back pain with LE referall.   OBJECTIVE IMPAIRMENTS: {opptimpairments:25111}.   ACTIVITY LIMITATIONS: {activitylimitations:27494}  PARTICIPATION LIMITATIONS: {participationrestrictions:25113}  PERSONAL FACTORS: {Personal factors:25162} are also affecting patient's functional outcome.    REHAB POTENTIAL: {rehabpotential:25112}  CLINICAL DECISION MAKING: {clinical decision making:25114}  EVALUATION COMPLEXITY: {Evaluation complexity:25115}   GOALS: Goals reviewed with patient? {yes/no:20286}  SHORT TERM GOALS: Target date: ***  Pt will demonstrate appropriate understanding and performance of initially prescribed HEP in order to facilitate improved independence with management of symptoms.  Baseline: HEP provided on eval Goal status: INITIAL   2. Pt will score greater than or equal to *** on FOTO in order to demonstrate improved perception of function due to symptoms.  Baseline: ***  Goal status: INITIAL    LONG TERM GOALS: Target date: ***  Pt will score *** on FOTO in order to demonstrate improved perception of functional status due to symptoms.  Baseline: *** Goal status: INITIAL  2.  Pt will demonstrate *** lumbar AROM in order to demonstrate improved tolerance to functional movement patterns.   Baseline: *** Goal status: INITIAL  3.  Pt will demonstrate hip MMT of *** in order to demonstrate improved strength for functional movements.  Baseline: *** Goal status: INITIAL  4. Pt will perform 5xSTS in <*** sec in order to demonstrate reduced fall risk and improved functional independence. (MCID of 2.3sec)  Baseline: ***  Goal status: INITIAL   PLAN:  PT FREQUENCY: {rehab frequency:25116}  PT DURATION: {rehab duration:25117}  PLANNED INTERVENTIONS: {rehab planned interventions:25118::"Therapeutic exercises","Therapeutic activity","Neuromuscular re-education","Balance training","Gait training","Patient/Family education","Self Care","Joint mobilization"}.  PLAN FOR NEXT SESSION: Progress ROM/strengthening exercises as able/appropriate, review HEP.    Leeroy Cha PT, DPT 02/07/2022 10:19 AM

## 2022-02-11 ENCOUNTER — Ambulatory Visit: Payer: Medicare Other | Admitting: Physical Therapy

## 2022-02-27 ENCOUNTER — Ambulatory Visit: Payer: Medicare Other | Admitting: Student

## 2022-03-01 ENCOUNTER — Encounter: Payer: Self-pay | Admitting: Student

## 2022-03-01 ENCOUNTER — Encounter: Payer: Self-pay | Admitting: Internal Medicine

## 2022-03-01 ENCOUNTER — Telehealth: Payer: Self-pay | Admitting: Student

## 2022-03-01 ENCOUNTER — Ambulatory Visit (INDEPENDENT_AMBULATORY_CARE_PROVIDER_SITE_OTHER): Payer: 59 | Admitting: Student

## 2022-03-01 VITALS — BP 124/74 | HR 93 | Ht 65.0 in | Wt 174.0 lb

## 2022-03-01 DIAGNOSIS — G5700 Lesion of sciatic nerve, unspecified lower limb: Secondary | ICD-10-CM | POA: Diagnosis not present

## 2022-03-01 MED ORDER — TRAMADOL HCL 50 MG PO TABS: 50.0000 mg | ORAL_TABLET | Freq: Four times a day (QID) | ORAL | 0 refills | Status: DC | PRN

## 2022-03-01 NOTE — Progress Notes (Signed)
    SUBJECTIVE:   CHIEF COMPLAINT / HPI:   Patient is a 62 year old female presenting today for lateral hip pain.  Was previously seen for hip pain and referral for physical therapy was placed, however patient hasn't been able to start therapy. This is an ongoing chronic issue which patient said was mostly on the left hip however she is currently experiencing right hip pain as well that started couple of days ago. Pain precipitated by long sitting or long walk. Reports previous paresthesia of lower extremities but not currently.  PERTINENT  PMH / PSH: Received   OBJECTIVE:   BP 124/74   Pulse 93   Ht '5\' 5"'$  (1.651 m)   Wt 174 lb (78.9 kg)   SpO2 96%   BMI 28.96 kg/m    Physical Exam General: Alert, well appearing, NAD Cardiovascular: RRR, No Murmurs, Normal S2/S2 Respiratory: CTAB, No wheezing or Rales  Hip Exam No deformity. FROM with 5/5 strength  Tenderness to palpation within gluteal musculature bilaterally, worse on the left..  No trochanter, SI joint, piriformis tenderness. Negative logroll. Negative faber, fadir, and piriformis stretches.   ASSESSMENT/PLAN:   Bilateral hip gluteal pain Patient's presentation is consistent with piriformis syndrome.  She endorses bilateral upper gluteal pain worse with sitting or prolonged walk.  Exam have some tenderness superior gluteal area more on the left. -Rx tramadol, patient cannot do NSAIDs due to history of ITP and low platelet. -Sent in referral for physical therapy -Encourage patient to use warm or cold compresses.   Alen Bleacher, MD [place]

## 2022-03-01 NOTE — Patient Instructions (Signed)
It was wonderful to see you today. Thank you for allowing me to be a part of your care. Below is a short summary of what we discussed at your visit today:  I suspect your hip pain is likely due to piriformis syndrome.  I have sent in referral to physical therapy.  Please call them to schedule an appointment with them.  I have also sent in tramadol which you will only use as needed for pain. No more than 4 times a day. Also effects include dizziness, drowsiness or constipation.  If unable to tolerate this please call so that we can switch your medication.  I recommended you can try warm water or cold compress in the area if you are having soreness as well.  Please bring all of your medications to every appointment!  If you have any questions or concerns, please do not hesitate to contact us via phone or MyChart message.   Alen Bleacher, MD [facility]

## 2022-03-01 NOTE — Telephone Encounter (Signed)
Patient called stating that she forgot to let the doctor know that she wanted to up her ozempic if possible. If he could give her a call please.

## 2022-03-04 ENCOUNTER — Other Ambulatory Visit: Payer: Self-pay

## 2022-03-04 ENCOUNTER — Other Ambulatory Visit: Payer: Self-pay | Admitting: Student

## 2022-03-04 DIAGNOSIS — E11319 Type 2 diabetes mellitus with unspecified diabetic retinopathy without macular edema: Secondary | ICD-10-CM

## 2022-03-04 MED ORDER — SEMAGLUTIDE (1 MG/DOSE) 4 MG/3ML ~~LOC~~ SOPN: 1.0000 mg | PEN_INJECTOR | SUBCUTANEOUS | 2 refills | Status: DC

## 2022-03-04 NOTE — Progress Notes (Signed)
Patient confirmed tolerating Ozempic well with no SE.Will increase to '1mg'$  weekly

## 2022-03-04 NOTE — Progress Notes (Signed)
Called and unable to reach pt. Left a generic VM.

## 2022-03-08 ENCOUNTER — Other Ambulatory Visit (HOSPITAL_COMMUNITY): Payer: Self-pay

## 2022-03-11 ENCOUNTER — Other Ambulatory Visit: Payer: Self-pay | Admitting: Family Medicine

## 2022-03-11 DIAGNOSIS — Z1231 Encounter for screening mammogram for malignant neoplasm of breast: Secondary | ICD-10-CM

## 2022-03-11 DIAGNOSIS — I10 Essential (primary) hypertension: Secondary | ICD-10-CM

## 2022-03-15 ENCOUNTER — Encounter: Payer: Self-pay | Admitting: Student

## 2022-03-15 ENCOUNTER — Ambulatory Visit (INDEPENDENT_AMBULATORY_CARE_PROVIDER_SITE_OTHER): Payer: 59 | Admitting: Student

## 2022-03-15 VITALS — BP 120/84 | HR 102 | Resp 16 | Wt 167.0 lb

## 2022-03-15 DIAGNOSIS — J069 Acute upper respiratory infection, unspecified: Secondary | ICD-10-CM | POA: Diagnosis not present

## 2022-03-15 NOTE — Progress Notes (Signed)
Pt presents cough  - pt experiencing dry cough for about a week or more -symptoms include cough, w/clear phlegm, denies any fever

## 2022-03-15 NOTE — Progress Notes (Signed)
    SUBJECTIVE:   CHIEF COMPLAINT / HPI:   Patient 62 year old female presenting today for concerns of cough and congestion.  Reports symptoms have been going on for about a week. Cough is productive of clear phelm.  She has not had any fevers, chest pain. Reported body at the start of her symptoms that has since resolved. Known sick contacts in niece who works at the hospital with cough, congestion and running nose.   PERTINENT  PMH / PSH: Reviewed  OBJECTIVE:   BP 120/84 (BP Location: Left Arm, Patient Position: Sitting, Cuff Size: Normal)   Pulse (!) 102   Resp 16   Wt 167 lb (75.8 kg)   SpO2 97%   BMI 27.79 kg/m    Physical Exam General: Alert, well appearing, NAD HEENT: No oropharyngeal erythema, no tonsillar exudate or cervical lymphadenopathy. Cardiovascular: RRR, No Murmurs, Normal S2/S2 Respiratory: CTAB, No wheezing or Rales Abdomen: No distension or tenderness Skin: Warm and dry  ASSESSMENT/PLAN:   Viral illness 62 y.o. year old presents with cough, and congestion. She is afebrile today and on exam no oropharyngeal erythema,  good work of breathing on RA and clear breath sounds bilaterally. Overall presentation and exam is consistent with viral URI.   - Discussed conservative management with warm water, honey and lemon. - Recommended tylenol or ibuprofen for fever.  - Encouraged adequate hydration for patient.  - Outline signs and symptoms that will warrant ED visit or return for further assessment.      Alen Bleacher, MD [place]

## 2022-03-15 NOTE — Patient Instructions (Signed)
It was wonderful to meet you today. Thank you for allowing me to be a part of your care. Below is a short summary of what we discussed at your visit today:  Your symptoms are consistent with upper respiratory viral illness.  I recommend you continue to do warm water, honey and lemon.  Please make sure you stay well-hydrated.  If you notice worsening symptoms or high fevers not responding well to Tylenol , or chest pain please return to be reevaluated.  Please bring all of your medications to every appointment!  If you have any questions or concerns, please do not hesitate to contact us via phone or MyChart message.   Alen Bleacher, MD [facility]

## 2022-03-20 ENCOUNTER — Ambulatory Visit: Payer: 59 | Admitting: Physical Therapy

## 2022-03-21 ENCOUNTER — Telehealth: Payer: Self-pay

## 2022-03-21 NOTE — Telephone Encounter (Signed)
Patient calls nurse line requesting an antibiotic.   She reports she was seen on 2/9 for viral infection. She reports she now feels she has a sinus infection.   She reports pressure and facial pain around her right eye and a slight headache.   She denies any fevers or worsening congestion. She reports clear phlegm.   Will forward to provider who saw patient.

## 2022-03-21 NOTE — Telephone Encounter (Signed)
Attempted to contact patient to schedule, however no answer or option for VM.  If she calls back please assist her scheduling an apt.

## 2022-03-22 ENCOUNTER — Ambulatory Visit (INDEPENDENT_AMBULATORY_CARE_PROVIDER_SITE_OTHER): Payer: 59 | Admitting: Family Medicine

## 2022-03-22 VITALS — BP 126/82 | HR 88 | Temp 97.3°F | Wt 170.2 lb

## 2022-03-22 DIAGNOSIS — B9689 Other specified bacterial agents as the cause of diseases classified elsewhere: Secondary | ICD-10-CM

## 2022-03-22 DIAGNOSIS — J329 Chronic sinusitis, unspecified: Secondary | ICD-10-CM | POA: Diagnosis not present

## 2022-03-22 DIAGNOSIS — E119 Type 2 diabetes mellitus without complications: Secondary | ICD-10-CM | POA: Diagnosis not present

## 2022-03-22 DIAGNOSIS — E118 Type 2 diabetes mellitus with unspecified complications: Secondary | ICD-10-CM | POA: Diagnosis not present

## 2022-03-22 MED ORDER — AMOXICILLIN-POT CLAVULANATE 875-125 MG PO TABS: 1.0000 | ORAL_TABLET | Freq: Two times a day (BID) | ORAL | 0 refills | Status: AC

## 2022-03-22 NOTE — Progress Notes (Signed)
    SUBJECTIVE:   CHIEF COMPLAINT / HPI:   Worsened sinus pain  Previously seen at [facility] 2/09, diagnosed with viral URI and recommended conservative symptomatic treatment. Since then, she has experienced more rhinorrhea, worsened sinus pressure and drainage, right ear pain, and right sided headache. Endorses tactile fevers for the last several days although Tmax on home thermometer was 99*F.   Took tylenol before coming in today.  No PMH asthma or COPD.   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 02/17/2015   Cataracts, bilateral 09/13/2014   Glaucoma 09/13/2014   Mitral regurgitation 02/18/2014   Hip pain 01/07/2014   Cerumen impaction 01/06/2013   Transaminitis 11/13/2012   Hyperlipidemia 04/04/2011   Pedal edema 04/04/2011   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[place]) [id number]   Cancer of central portion of right female breast ([place]) 10/04/2009   Idiopathic thrombocytopenic purpura (ITP) ([place]) 06/22/2009   Essential hypertension 06/22/2009   HYPERPARATHYROIDISM, HX OF 06/22/2009   Avitaminosis D 09/01/2008    OBJECTIVE:   BP 126/82   Pulse 88   Temp (!) 97.3 F (36.3 C) (Oral)   Wt 170 lb 3.2 oz (77.2 kg)   SpO2 97%   BMI 28.32 kg/m     PHQ-9:     03/22/2022    1:31 PM 03/15/2022   10:53 AM 03/01/2022    8:24 AM  Depression screen PHQ 2/9  Decreased Interest 0 0 0  Down, Depressed, Hopeless 0 0 0  PHQ - 2 Score 0 0 0  Altered  sleeping 0 0 0  Tired, decreased energy 0 0 0  Change in appetite 0 0 0  Feeling bad or failure about yourself  0 0 0  Trouble concentrating 0 0 0  Moving slowly or fidgety/restless 0 0 0  Suicidal thoughts 0 0 0  PHQ-9 Score 0 0 0  Difficult doing work/chores Not difficult at all      Physical Exam General: Awake, alert, oriented HEENT: PERRL, bilateral TM pearly pink and flat, bilateral external auditory canals with minimal cerumen burden, no lesions, nasal mucosa slightly edematous, oral mucosa pink, moist, without lesion, intact dentition without obvious cavity, sinus TTP over right side Lymph: No palpable lymphedema of head or neck Cardiovascular: Regular rate and rhythm, S1 and S2 present, no murmurs auscultated Respiratory: Lung fields clear to auscultation bilaterally  ASSESSMENT/PLAN:   Bacterial sinusitis Given worsened sinus pain, pressure, and drainage approx 2 weeks after viral URI will treat as bacterial sinusitis. Rx augmentin. Return and ED precautions given, see AVS for more.   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     Ezequiel Essex, MD [place]

## 2022-03-22 NOTE — Patient Instructions (Signed)
It was wonderful to see you today. Thank you for allowing me to be a part of your care. Below is a short summary of what we discussed at your visit today:  Sinus pain, ear discomfort You likely have a secondary bacterial infection after viral upper respiratory infection. Take Augmentin twice daily for 5 days. Continue the other conservative care that was discussed with you and Dr. Adah Salvage.  Fluids: make sure your child drinks enough water or Pedialyte; for older kids Gatorade is okay too. Signs of dehydration are not making tears or urinating less than once every 8-10 hours.  Treatment:  - give 1 tablespoon of honey 3-4 times a day.  - You can also mix honey and lemon in chamomille or peppermint tea.  - You can use nasal saline to loosen nose mucus - Place a humidifier next to her bed while she sleeps - If someone is taking a hot shower, let her sit in the bathroom to breathe in the steam - research studies show that honey works better than cough medicine. Do not give kids cough medicine; every year in the [place] kids overdose on cough medicine.   Timeline:  - fever, runny nose, and fussiness get worse up to day 4 or 5, but then get better - it can take 2-3 weeks for cough to completely go away - cough may take weeks to resolve  Reasons to return for care include if: - is having trouble eating  - is acting very sleepy and not waking up to eat - is having trouble breathing or turns blue - is dehydrated  Diabetes  I have referred you to ophthalmology for diabetic eye exam.  Someone from their office should be calling you in 1 to 2 weeks to schedule an appointment.  If you do not hear from them, let us know. We may need to nudge along the referral.    Medicare Annual Wellness Exam Your chart indicates that you are due for your Medicare annual wellness exam.  Your insurance likes [place] to do one of these every year.  This is a nurse only visit that takes about 30 minutes to an hour.   It can be done either in person or virtually.  This is an in-depth visit that focuses on preventative care and keeping you healthy.  Somebody from our clinic will be calling you soon to get this scheduled.   Please bring all of your medications to every appointment!  If you have any questions or concerns, please do not hesitate to contact us via phone or MyChart message.   Ezequiel Essex, MD

## 2022-03-27 ENCOUNTER — Other Ambulatory Visit: Payer: Self-pay

## 2022-03-27 DIAGNOSIS — E11319 Type 2 diabetes mellitus with unspecified diabetic retinopathy without macular edema: Secondary | ICD-10-CM

## 2022-03-27 MED ORDER — SEMAGLUTIDE (1 MG/DOSE) 4 MG/3ML ~~LOC~~ SOPN: 1.0000 mg | PEN_INJECTOR | SUBCUTANEOUS | 2 refills | Status: DC

## 2022-03-31 DIAGNOSIS — B9689 Other specified bacterial agents as the cause of diseases classified elsewhere: Secondary | ICD-10-CM | POA: Insufficient documentation

## 2022-03-31 NOTE — Assessment & Plan Note (Signed)
Referred for routine ophtho exam.

## 2022-03-31 NOTE — Assessment & Plan Note (Signed)
>>  ASSESSMENT AND PLAN FOR DM (DIABETES MELLITUS) WITH COMPLICATIONS ([facility]) WRITTEN ON 03/31/2022 11:13 PM BY LYNN, CATHERINE, MD  Referred for routine ophtho exam.

## 2022-03-31 NOTE — Assessment & Plan Note (Signed)
Given worsened sinus pain, pressure, and drainage approx 2 weeks after viral URI will treat as bacterial sinusitis. Rx augmentin. Return and ED precautions given, see AVS for more.

## 2022-04-04 ENCOUNTER — Telehealth: Payer: Self-pay | Admitting: Student

## 2022-04-04 NOTE — Telephone Encounter (Signed)
Patient was on .25 of this medication and was told to go up to 0.5, but '1mg'$  was sent in.  She would like clarification on what she should do.  SHE IS DUE FOR A DOSE TODAY. Christen Bame, CMA

## 2022-04-04 NOTE — Telephone Encounter (Signed)
Patient was initially on 0.25 Ozempic weekly and reports that she received prescription for 1 mg weekly.  She states she just picked up the 1 mg weekly prescription and wanted to clarify that it is okay to start 1 mg weekly.  Patient reports good tolerance to the 0.25 mg.  Encourage patient it would be okay to do 1 mg weekly and discussed plan to taper down if she does not tolerate the 1 mg weekly.

## 2022-04-05 ENCOUNTER — Ambulatory Visit (AMBULATORY_SURGERY_CENTER): Payer: 59

## 2022-04-05 VITALS — Ht 62.0 in | Wt 172.0 lb

## 2022-04-05 DIAGNOSIS — Z86010 Personal history of colonic polyps: Secondary | ICD-10-CM

## 2022-04-05 MED ORDER — PEG 3350-KCL-NA BICARB-NACL 420 G PO SOLR: 4000.0000 mL | Freq: Once | ORAL | 0 refills | Status: AC

## 2022-04-05 NOTE — Progress Notes (Signed)
No egg or soy allergy known to patient  No issues known to pt with past sedation with any surgeries or procedures Patient denies ever being told they had issues or difficulty with intubation  No FH of Malignant Hyperthermia Pt is not on diet pills Pt is not on  home 02  Pt is not on blood thinners  Pt denies issues with constipation  No A fib or A flutter Have any cardiac testing pending--no Pt instructed to use Singlecare.com or GoodRx for a price reduction on prep  Patient's chart reviewed by [name] CNRA prior to previsit and patient appropriate for the [facility].  Previsit completed and red dot placed by patient's name on their procedure day (on provider's schedule).    

## 2022-04-08 ENCOUNTER — Telehealth: Payer: Self-pay | Admitting: Internal Medicine

## 2022-04-08 NOTE — Telephone Encounter (Signed)
All questions answered

## 2022-04-08 NOTE — Telephone Encounter (Signed)
Patient called would like to know if she should take her BP medication the day of her procedure scheduled for 04/19/22.

## 2022-04-19 ENCOUNTER — Encounter: Payer: Self-pay | Admitting: Internal Medicine

## 2022-04-19 ENCOUNTER — Ambulatory Visit (AMBULATORY_SURGERY_CENTER): Payer: 59 | Admitting: Internal Medicine

## 2022-04-19 VITALS — BP 133/52 | HR 83 | Temp 97.3°F | Resp 14 | Ht 65.0 in | Wt 172.0 lb

## 2022-04-19 DIAGNOSIS — Z1211 Encounter for screening for malignant neoplasm of colon: Secondary | ICD-10-CM

## 2022-04-19 DIAGNOSIS — Z86010 Personal history of colonic polyps: Secondary | ICD-10-CM

## 2022-04-19 MED ORDER — SODIUM CHLORIDE 0.9 % IV SOLN: 500.0000 mL | Freq: Once | INTRAVENOUS | Status: DC

## 2022-04-19 NOTE — Progress Notes (Signed)
Pt's states no medical or surgical changes since previsit or office visit. 

## 2022-04-19 NOTE — Progress Notes (Signed)
A and O x3. Report to RN. Tolerated MAC anesthesia well. 

## 2022-04-19 NOTE — Progress Notes (Signed)
HISTORY OF PRESENT ILLNESS:  [name] is a 62 y.o. female who presents today for screening colonoscopy.  Index examination 2014 was negative for neoplasia  REVIEW OF SYSTEMS:  All non-GI ROS negative except for  Past Medical History:  Diagnosis Date   Arthritis    Breast CA ([place])    breast CA- right breast   Diabetes mellitus    Hyperlipidemia    Hypertension    Thrombocytopenia ([place])    Thyroid disease    parathyroid removed    Past Surgical History:  Procedure Laterality Date   ABDOMINAL HYSTERECTOMY     1996 per pt   BREAST BIOPSY Right 06/2009   BREAST SURGERY  09/12/2010   excision of skin mass - right breast, had another surgery to remove more Cancer cells   COLONOSCOPY     INNER EAR SURGERY  06/01/2013   MASTECTOMY Right 07/21/2009   PARATHYROIDECTOMY     partial?   REDUCTION MAMMAPLASTY Left 2013    Social History [name]  reports that she has never smoked. She has been exposed to tobacco smoke. She has never used smokeless tobacco. She reports that she does not drink alcohol and does not use drugs.  family history includes CAD (age of onset: 48) in her mother; Colon polyps in her sister; Heart attack (age of onset: 20) in her brother; Stroke in her father.  No Known Allergies     PHYSICAL EXAMINATION: Vital signs: BP (!) 148/80   Pulse 96   Temp (!) 97.3 F (36.3 C) (Temporal)   Ht 5\' 5"  (1.651 m)   Wt 172 lb (78 kg)   SpO2 98%   BMI 28.62 kg/m  General: Well-developed, well-nourished, no acute distress HEENT: Sclerae are anicteric, conjunctiva pink. Oral mucosa intact Lungs: Clear Heart: Regular Abdomen: soft, nontender, nondistended, no obvious ascites, no peritoneal signs, normal bowel sounds. No organomegaly. Extremities: No edema Psychiatric: alert and oriented x3. Cooperative     ASSESSMENT:  Colon cancer screening   PLAN:  Screening colonoscopy

## 2022-04-19 NOTE — Op Note (Signed)
[place] Patient Name: [name] Procedure Date: 04/19/2022 10:09 AM MRN: [id number] Endoscopist: Docia Chuck. Henrene Pastor , MD, [id number] Age: 62 Referring MD:  Date of Birth: November 02, 1960 Gender: Female Account #: [id number] Procedure:                Colonoscopy Indications:              Screening for colorectal malignant neoplasm.                            Negative index exam 2014 Medicines:                Monitored Anesthesia Care Procedure:                Pre-Anesthesia Assessment:                           - Prior to the procedure, a History and Physical                            was performed, and patient medications and                            allergies were reviewed. The patient's tolerance of                            previous anesthesia was also reviewed. The risks                            and benefits of the procedure and the sedation                            options and risks were discussed with the patient.                            All questions were answered, and informed consent                            was obtained. Prior Anticoagulants: The patient has                            taken no anticoagulant or antiplatelet agents. ASA                            Grade Assessment: II - A patient with mild systemic                            disease. After reviewing the risks and benefits,                            the patient was deemed in satisfactory condition to                            undergo the procedure.  After obtaining informed consent, the colonoscope                            was passed under direct vision. Throughout the                            procedure, the patient's blood pressure, pulse, and                            oxygen saturations were monitored continuously. The                            CF [id number] was introduced through the anus                            and advanced to the the cecum,  identified by                            appendiceal orifice and ileocecal valve. The                            ileocecal valve, appendiceal orifice, and rectum                            were photographed. The quality of the bowel                            preparation was excellent. The colonoscopy was                            performed without difficulty. The patient tolerated                            the procedure well. The bowel preparation used was                            SUPREP via split dose instruction. Scope In: 10:15:53 AM Scope Out: 10:27:35 AM Scope Withdrawal Time: 0 hours 7 minutes 15 seconds  Total Procedure Duration: 0 hours 11 minutes 42 seconds  Findings:                 Multiple small-mouthed diverticula were found in                            the transverse colon and right colon.                           The exam was otherwise without abnormality on                            direct and retroflexion views. Complications:            No immediate complications. Estimated blood loss:  None. Estimated Blood Loss:     Estimated blood loss: none. Impression:               - Diverticulosis in the transverse colon and in the                            right colon.                           - The examination was otherwise normal on direct                            and retroflexion views.                           - No specimens collected. Recommendation:           - Repeat colonoscopy in 10 years for screening                            purposes.                           - Patient has a contact number available for                            emergencies. The signs and symptoms of potential                            delayed complications were discussed with the                            patient. Return to normal activities tomorrow.                            Written discharge instructions were provided to the                             patient.                           - Resume previous diet.                           - Continue present medications. Docia Chuck. Henrene Pastor, MD 04/19/2022 10:33:46 AM This report has been signed electronically.

## 2022-04-19 NOTE — Patient Instructions (Addendum)
-  Handout on diverticulosis provided  -repeat colonoscopy in 10 years for surveillance recommended.  -Continue present medications    YOU HAD AN ENDOSCOPIC PROCEDURE TODAY AT [facility]:   Refer to the procedure report that was given to you for any specific questions about what was found during the examination.  If the procedure report does not answer your questions, please call your gastroenterologist to clarify.  If you requested that your care partner not be given the details of your procedure findings, then the procedure report has been included in a sealed envelope for you to review at your convenience later.  YOU SHOULD EXPECT: Some feelings of bloating in the abdomen. Passage of more gas than usual.  Walking can help get rid of the air that was put into your GI tract during the procedure and reduce the bloating. If you had a lower endoscopy (such as a colonoscopy or flexible sigmoidoscopy) you may notice spotting of blood in your stool or on the toilet paper. If you underwent a bowel prep for your procedure, you may not have a normal bowel movement for a few days.  Please Note:  You might notice some irritation and congestion in your nose or some drainage.  This is from the oxygen used during your procedure.  There is no need for concern and it should clear up in a day or so.  SYMPTOMS TO REPORT IMMEDIATELY:  Following lower endoscopy (colonoscopy or flexible sigmoidoscopy):  Excessive amounts of blood in the stool  Significant tenderness or worsening of abdominal pains  Swelling of the abdomen that is new, acute  Fever of 100F or higher   For urgent or emergent issues, a gastroenterologist can be reached at any hour by calling [phone]. Do not use MyChart messaging for urgent concerns.    DIET:  We do recommend a small meal at first, but then you may proceed to your regular diet.  Drink plenty of fluids but you should avoid alcoholic beverages for 24  hours.  ACTIVITY:  You should plan to take it easy for the rest of today and you should NOT DRIVE or use heavy machinery until tomorrow (because of the sedation medicines used during the test).    FOLLOW UP: Our staff will call the number listed on your records the next business day following your procedure.  We will call around 7:15- 8:00 am to check on you and address any questions or concerns that you may have regarding the information given to you following your procedure. If we do not reach you, we will leave a message.     If any biopsies were taken you will be contacted by phone or by letter within the next 1-3 weeks.  Please call us at [phone] if you have not heard about the biopsies in 3 weeks.    SIGNATURES/CONFIDENTIALITY: You and/or your care partner have signed paperwork which will be entered into your electronic medical record.  These signatures attest to the fact that that the information above on your After Visit Summary has been reviewed and is understood.  Full responsibility of the confidentiality of this discharge information lies with you and/or your care-partner.

## 2022-04-22 ENCOUNTER — Telehealth: Payer: Self-pay | Admitting: *Deleted

## 2022-04-22 NOTE — Telephone Encounter (Signed)
Attempted to call patient for their post-procedure follow-up. No answer. Left voicemail.

## 2022-04-24 ENCOUNTER — Other Ambulatory Visit: Payer: Self-pay | Admitting: Student

## 2022-04-24 DIAGNOSIS — E785 Hyperlipidemia, unspecified: Secondary | ICD-10-CM

## 2022-04-29 ENCOUNTER — Telehealth: Payer: Self-pay | Admitting: Student

## 2022-04-29 NOTE — Telephone Encounter (Signed)
Contacted Elizia Paolo to schedule their annual wellness visit. Appointment made for 05/06/2022.  Thank you,  [place] Direct dial  [phone]

## 2022-04-30 ENCOUNTER — Other Ambulatory Visit: Payer: Self-pay

## 2022-04-30 DIAGNOSIS — E11319 Type 2 diabetes mellitus with unspecified diabetic retinopathy without macular edema: Secondary | ICD-10-CM

## 2022-04-30 MED ORDER — SEMAGLUTIDE (1 MG/DOSE) 4 MG/3ML ~~LOC~~ SOPN: 1.0000 mg | PEN_INJECTOR | SUBCUTANEOUS | 2 refills | Status: DC

## 2022-05-06 ENCOUNTER — Ambulatory Visit (INDEPENDENT_AMBULATORY_CARE_PROVIDER_SITE_OTHER): Payer: 59

## 2022-05-06 DIAGNOSIS — Z0000 Encounter for general adult medical examination without abnormal findings: Secondary | ICD-10-CM | POA: Diagnosis not present

## 2022-05-06 NOTE — Progress Notes (Signed)
I connected with  Velia Meyer on 05/06/22 by a audio enabled telemedicine application and verified that I am speaking with the correct person using two identifiers.  Patient Location: Home  Provider Location: Home Office  I discussed the limitations of evaluation and management by telemedicine. The patient expressed understanding and agreed to proceed.   Subjective:   Daphney Sanzari is a 62 y.o. female who presents for an Initial Medicare Annual Wellness Visit.  Review of Systems    Per HPI unless specifically indicated below.  Cardiac Risk Factors include: advanced age (>25men, >59 women);female gender, Essential Hypertension,  and Hyperlipidemia.           Objective:       04/19/2022   10:50 AM 04/19/2022   10:40 AM 04/19/2022   10:30 AM  Vitals with BMI  Systolic [id number]  Diastolic 52 55 58  Pulse 83 84 89    Today's Vitals   05/06/22 1507  PainSc: 7    There is no height or weight on file to calculate BMI.     03/01/2022    8:23 AM 12/04/2021    2:13 PM 11/06/2021    8:33 AM 10/03/2021    8:54 AM 05/21/2021    8:51 AM 05/08/2021    8:27 AM 12/29/2019    8:31 AM  Advanced Directives  Does Patient Have a Medical Advance Directive? No Yes No No  Yes No  Type of Advance Directive  Living will   Living will Living will   Would patient like information on creating a medical advance directive? No - Patient declined No - Patient declined No - Patient declined No - Patient declined       Current Medications (verified) Outpatient Encounter Medications as of 05/06/2022  Medication Sig   Blood Glucose Monitoring Suppl (ONETOUCH VERIO REFLECT) w/Device KIT USE TO CHECK BLOOD SUGAR ONCE PER DAY   carvedilol (COREG) 12.5 MG tablet TAKE 1 TABLET BY MOUTH TWICE DAILY WITH A MEAL   glucose blood (ONETOUCH VERIO) test strip Use as instructed   hydrochlorothiazide (HYDRODIURIL) 25 MG tablet Take 1 tablet by mouth once daily   Lancet Devices (ONE TOUCH DELICA LANCING DEV)  MISC Please use to check blood sugar once per day. E11.9   Lancets (ONETOUCH DELICA PLUS 123XX123) MISC USE  1 TO CHECK GLUCOSE ONCE DAILY   lisinopril (ZESTRIL) 40 MG tablet Take 1 tablet by mouth once daily   metFORMIN (GLUCOPHAGE-XR) 500 MG 24 hr tablet Take 2 tablets (1,000 mg total) by mouth 2 (two) times daily with a meal.   polyethylene glycol powder (GLYCOLAX/MIRALAX) 17 GM/SCOOP powder Take 17 g by mouth 2 (two) times daily as needed for moderate constipation.   rosuvastatin (CRESTOR) 20 MG tablet TAKE 1 TABLET BY MOUTH THREE TIMES A WEEK   Semaglutide, 1 MG/DOSE, 4 MG/3ML SOPN Inject 1 mg into the skin once a week.   traMADol (ULTRAM) 50 MG tablet Take 1 tablet (50 mg total) by mouth every 6 (six) hours as needed.   [DISCONTINUED] gabapentin (NEURONTIN) 300 MG capsule Take 1 capsule (300 mg total) by mouth 3 (three) times daily. (Patient not taking: Reported on 04/19/2022)   No facility-administered encounter medications on file as of 05/06/2022.    Allergies (verified) Patient has no known allergies.   History: Past Medical History:  Diagnosis Date   Arthritis    Breast CA    breast CA- right breast   Diabetes mellitus    Hyperlipidemia  Hypertension    Thrombocytopenia    Thyroid disease    parathyroid removed   Past Surgical History:  Procedure Laterality Date   ABDOMINAL HYSTERECTOMY     1996 per pt   BREAST BIOPSY Right 06/2009   BREAST SURGERY  09/12/2010   excision of skin mass - right breast, had another surgery to remove more Cancer cells   COLONOSCOPY     INNER EAR SURGERY  06/01/2013   MASTECTOMY Right 07/21/2009   PARATHYROIDECTOMY     partial?   REDUCTION MAMMAPLASTY Left 2013   Family History  Problem Relation Age of Onset   CAD Mother 35       CABG   Stroke Father    Colon polyps Sister    Heart attack Brother 57   Colon cancer Neg Hx    Esophageal cancer Neg Hx    Rectal cancer Neg Hx    Stomach cancer Neg Hx    Social History    Socioeconomic History   Marital status: Married    Spouse name: Not on file   Number of children: 2   Years of education: Not on file   Highest education level: Not on file  Occupational History   Occupation: Disability  Tobacco Use   Smoking status: Never    Passive exposure: Past   Smokeless tobacco: Never  Vaping Use   Vaping Use: Never used  Substance and Sexual Activity   Alcohol use: No   Drug use: No   Sexual activity: Not Currently    Birth control/protection: Surgical  Other Topics Concern   Not on file  Social History Narrative   Pt lives with husband and in [place].   Does not work or go to school.   Walks daily    Exercises with walking in yard.   Drinks 2 cans sweetened soda daily.   Social Determinants of Health   Financial Resource Strain: Low Risk  (05/06/2022)   Overall Financial Resource Strain (CARDIA)    Difficulty of Paying Living Expenses: Not hard at all  Food Insecurity: No Food Insecurity (05/06/2022)   Hunger Vital Sign    Worried About Running Out of Food in the Last Year: Never true    Ran Out of Food in the Last Year: Never true  Transportation Needs: No Transportation Needs (05/06/2022)   PRAPARE - Hydrologist (Medical): No    Lack of Transportation (Non-Medical): No  Physical Activity: Sufficiently Active (05/06/2022)   Exercise Vital Sign    Days of Exercise per Week: 3 days    Minutes of Exercise per Session: 60 min  Stress: No Stress Concern Present (05/06/2022)   [place]    Feeling of Stress : Not at all  Social Connections: Moderately Isolated (05/06/2022)   Social Connection and Isolation Panel [NHANES]    Frequency of Communication with Friends and Family: More than three times a week    Frequency of Social Gatherings with Friends and Family: Twice a week    Attends Religious Services: Never    Marine scientist or Organizations:  No    Attends Music therapist: Never    Marital Status: Married    Tobacco Counseling Counseling given: No   Clinical Intake:  Pre-visit preparation completed: No  Pain : 0-10 Pain Score: 7  Pain Type: Chronic pain Pain Location: Hip Pain Orientation: Right Pain Descriptors / Indicators: Aching Pain Onset: More  than a month ago Pain Frequency: Constant     Nutritional Status: BMI 25 -29 Overweight Nutritional Risks: None Diabetes: Yes CBG done?: Yes CBG resulted in Enter/ Edit results?: No Did pt. bring in CBG monitor from home?: No  How often do you need to have someone help you when you read instructions, pamphlets, or other written materials from your doctor or pharmacy?: 1 - Never  Diabetic?Nutrition Risk Assessment:  Has the patient had any N/V/D within the last 2 months?  No  Does the patient have any non-healing wounds?  No  Has the patient had any unintentional weight loss or weight gain?  No   Diabetes:  Is the patient diabetic?  Yes  If diabetic, was a CBG obtained today?  Yes  Did the patient bring in their glucometer from home?  No  How often do you monitor your CBG's? Twice daily.   Financial Strains and Diabetes Management:  Are you having any financial strains with the device, your supplies or your medication? No .  Does the patient want to be seen by Chronic Care Management for management of their diabetes?  No  Would the patient like to be referred to a Nutritionist or for Diabetic Management?  No   Diabetic Exams:  Diabetic Eye Exam: Completed 02/21/2021, ordered 03/22/2022 Diabetic Foot Exam: Completed 05/21/2021    Interpreter Needed?: No  Information entered by :: Donnie [place], [place]   Activities of Daily Living    05/06/2022    3:07 PM  In your present state of health, do you have any difficulty performing the following activities:  Hearing? 0  Vision? 0  Difficulty concentrating or making decisions? 0  Walking or  climbing stairs? 0  Dressing or bathing? 0  Doing errands, shopping? 0    Patient Care Team: Alen Bleacher, MD as PCP - General (Family Medicine) Minus Breeding, MD as PCP - Cardiology (Cardiology)  Indicate any recent Medical Services you may have received from other than [facility] providers in the past year (date may be approximate).     Assessment:   This is a routine wellness examination for Rickita.  Hearing/Vision screen Hearing/Vision screen Denies any hearing issues. Denies any change to her vision. Annual Eye Exam.   Dietary issues and exercise activities discussed: Current Exercise Habits: Structured exercise class, Type of exercise: walking, Time (Minutes): 40, Frequency (Times/Week): 3, Weekly Exercise (Minutes/Week): 120, Intensity: Moderate, Exercise limited by: None identified   Goals Addressed   None    Depression Screen    05/06/2022    3:07 PM 03/22/2022    1:31 PM 03/15/2022   10:53 AM 03/01/2022    8:24 AM 12/25/2021    1:41 PM 12/04/2021    2:12 PM 11/06/2021    8:33 AM  PHQ 2/9 Scores  PHQ - 2 Score 0 0 0 0 0 0 0  PHQ- 9 Score  0 0 0 0 0 0    Fall Risk    05/06/2022    3:07 PM 03/22/2022    1:31 PM 03/15/2022   10:53 AM 12/04/2021    2:12 PM 10/03/2021    9:04 AM  [facility] in the past year? 0 1 0 0 0  Number falls in past yr: 0  0  0  Injury with Fall? 0  0    Risk for fall due to : No Fall Risks  No Fall Risks    Follow up Falls evaluation completed  Falls evaluation completed  FALL RISK PREVENTION PERTAINING TO THE HOME:  Any stairs in or around the home? No  If so, are there any without handrails? No  Home free of loose throw rugs in walkways, pet beds, electrical cords, etc? Yes  Adequate lighting in your home to reduce risk of falls? Yes   ASSISTIVE DEVICES UTILIZED TO PREVENT FALLS:  Life alert? No  Use of a cane, walker or w/c? No  Grab bars in the bathroom? Yes  Shower chair or bench in shower? No  Elevated toilet seat or  a handicapped toilet? No   TIMED UP AND GO:  Was the test performed? Marland KitchenUnable to perform, virtual appointment   Cognitive Function:        05/06/2022    3:12 PM  6CIT Screen  What Year? 0 points  What month? 0 points  What time? 0 points  Count back from 20 0 points  Months in reverse 0 points  Repeat phrase 0 points  Total Score 0 points    Immunizations Immunization History  Administered Date(s) Administered   COVID-19, mRNA, vaccine(Comirnaty)12 years and older 12/04/2021   Influenza Split 11/09/2010, 11/12/2011   Influenza,inj,Quad PF,6+ Mos 11/13/2012, 11/17/2015, 11/28/2016, 12/02/2017, 11/13/2018, 11/30/2019, 12/22/2020, 11/06/2021   Influenza-Unspecified 11/04/2013, 11/13/2014   PFIZER Comirnaty(Gray Top)Covid-19 Tri-Sucrose Vaccine 12/04/2021   PFIZER(Purple Top)SARS-COV-2 Vaccination 04/20/2019, 05/11/2019, 12/15/2019   PNEUMOCOCCAL CONJUGATE-20 09/19/2020   PPD Test 01/16/2011, 06/27/2015   Pneumococcal Polysaccharide-23 12/06/1998, 11/13/2012   Td 02/04/1994   Tdap 11/13/2012    TDAP status: Up to date  Flu Vaccine status: Up to date  Pneumococcal vaccine status: Up to date  Covid-19 vaccine status: Information provided on how to obtain vaccines.   Qualifies for Shingles Vaccine? Yes   Zostavax completed No   Shingrix Completed?: No.    Education has been provided regarding the importance of this vaccine. Patient has been advised to call insurance company to determine out of pocket expense if they have not yet received this vaccine. Advised may also receive vaccine at local pharmacy or Health Dept. Verbalized acceptance and understanding.  Screening Tests Health Maintenance  Topic Date Due   Zoster Vaccines- Shingrix (1 of 2) Never done   COVID-19 Vaccine (5 - 2023-24 season) 01/29/2022   OPHTHALMOLOGY EXAM  02/21/2022   HEMOGLOBIN A1C  04/04/2022   FOOT EXAM  05/22/2022   INFLUENZA VACCINE  09/05/2022   DTaP/Tdap/Td (3 - Td or Tdap) 11/14/2022    Diabetic kidney evaluation - eGFR measurement  12/26/2022   Diabetic kidney evaluation - Urine ACR  12/26/2022   Medicare Annual Wellness (AWV)  05/06/2023   MAMMOGRAM  07/17/2023   COLONOSCOPY (Pts 45-30yrs Insurance coverage will need to be confirmed)  04/18/2032   Hepatitis C Screening  Completed   HIV Screening  Completed   HPV VACCINES  Aged Out    Health Maintenance  Health Maintenance Due  Topic Date Due   Zoster Vaccines- Shingrix (1 of 2) Never done   COVID-19 Vaccine (5 - 2023-24 season) 01/29/2022   OPHTHALMOLOGY EXAM  02/21/2022   HEMOGLOBIN A1C  04/04/2022    Colorectal cancer screening: Type of screening: Colonoscopy. Completed 04/19/2022. Repeat every 10 years  Mammogram status: Completed 07/16/2021, scheduled 07/23/2022. Repeat every year  DEXA Scan: not applicable   Lung Cancer Screening: (Low Dose CT Chest recommended if Age 40-80 years, 30 pack-year currently smoking OR have quit w/in 15years.) does not qualify.   Lung Cancer Screening Referral: not applicable   Additional Screening:  Hepatitis  C Screening: does qualify; Completed 02/17/2015  Vision Screening: Recommended annual ophthalmology exams for early detection of glaucoma and other disorders of the eye. Is the patient up to date with their annual eye exam?  Yes  Who is the provider or what is the name of the office in which the patient attends annual eye exams? Appt scheduled 5/24, Dr. Schuyler Amor  If pt is not established with a provider, would they like to be referred to a provider to establish care? No .   Dental Screening: Recommended annual dental exams for proper oral hygiene  Community Resource Referral / Chronic Care Management: CRR required this visit?  No   CCM required this visit?  No      Plan:     I have personally reviewed and noted the following in the patient's chart:   Medical and social history Use of alcohol, tobacco or illicit drugs  Current medications and supplements  including opioid prescriptions. Patient is not currently taking opioid prescriptions. Functional ability and status Nutritional status Physical activity Advanced directives List of other physicians Hospitalizations, surgeries, and ER visits in previous 12 months Vitals Screenings to include cognitive, depression, and falls Referrals and appointments  In addition, I have reviewed and discussed with patient certain preventive protocols, quality metrics, and best practice recommendations. A written personalized care plan for preventive services as well as general preventive health recommendations were provided to patient.    Ms. Snowman , Thank you for taking time to come for your Medicare Wellness Visit. I appreciate your ongoing commitment to your health goals. Please review the following plan we discussed and let me know if I can assist you in the future.   These are the goals we discussed:  Goals   None     This is a list of the screening recommended for you and due dates:  Health Maintenance  Topic Date Due   Zoster (Shingles) Vaccine (1 of 2) Never done   COVID-19 Vaccine (5 - 2023-24 season) 01/29/2022   Eye exam for diabetics  02/21/2022   Hemoglobin A1C  04/04/2022   Complete foot exam   05/22/2022   Flu Shot  09/05/2022   DTaP/Tdap/Td vaccine (3 - Td or Tdap) 11/14/2022   Yearly kidney function blood test for diabetes  12/26/2022   Yearly kidney health urinalysis for diabetes  12/26/2022   Medicare Annual Wellness Visit  05/06/2023   Mammogram  07/17/2023   Colon Cancer Screening  04/18/2032   Hepatitis C Screening: USPSTF Recommendation to screen - Ages 18-79 yo.  Completed   HIV Screening  Completed   HPV Vaccine  Aged [address] [place]   05/06/2022   Nurse Notes: Approximately 30 minute Non-Face -To-Face Medicare Wellness Visit

## 2022-05-06 NOTE — Patient Instructions (Signed)

## 2022-05-08 ENCOUNTER — Other Ambulatory Visit: Payer: Self-pay

## 2022-05-08 DIAGNOSIS — G5700 Lesion of sciatic nerve, unspecified lower limb: Secondary | ICD-10-CM

## 2022-05-09 MED ORDER — TRAMADOL HCL 50 MG PO TABS: 50.0000 mg | ORAL_TABLET | Freq: Four times a day (QID) | ORAL | 0 refills | Status: DC | PRN

## 2022-05-13 ENCOUNTER — Telehealth: Payer: Self-pay

## 2022-05-13 ENCOUNTER — Ambulatory Visit (INDEPENDENT_AMBULATORY_CARE_PROVIDER_SITE_OTHER): Payer: 59 | Admitting: Student

## 2022-05-13 ENCOUNTER — Encounter: Payer: Self-pay | Admitting: Student

## 2022-05-13 VITALS — BP 118/64 | HR 95 | Ht 65.0 in | Wt 163.0 lb

## 2022-05-13 DIAGNOSIS — E119 Type 2 diabetes mellitus without complications: Secondary | ICD-10-CM | POA: Diagnosis not present

## 2022-05-13 DIAGNOSIS — R5383 Other fatigue: Secondary | ICD-10-CM | POA: Diagnosis not present

## 2022-05-13 DIAGNOSIS — E11319 Type 2 diabetes mellitus with unspecified diabetic retinopathy without macular edema: Secondary | ICD-10-CM

## 2022-05-13 DIAGNOSIS — E118 Type 2 diabetes mellitus with unspecified complications: Secondary | ICD-10-CM | POA: Diagnosis not present

## 2022-05-13 LAB — POCT GLYCOSYLATED HEMOGLOBIN (HGB A1C): HbA1c, POC (controlled diabetic range): 6 % (ref 0.0–7.0)

## 2022-05-13 MED ORDER — SEMAGLUTIDE (1 MG/DOSE) 4 MG/3ML ~~LOC~~ SOPN: 0.2500 mg | PEN_INJECTOR | SUBCUTANEOUS | 2 refills | Status: DC

## 2022-05-13 MED ORDER — SEMAGLUTIDE(0.25 OR 0.5MG/DOS) 2 MG/1.5ML ~~LOC~~ SOPN: 0.2500 mg | PEN_INJECTOR | SUBCUTANEOUS | 2 refills | Status: DC

## 2022-05-13 NOTE — Patient Instructions (Signed)
It was wonderful to meet you today. Thank you for allowing me to be a part of your care. Below is a short summary of what we discussed at your visit today:  Because of your recent fatigue we will check your blood count, thyroid and electrolytes today.  Your A1c today was 6.0 and within goal.  Also given your decreased appetite we will go down on your Ozempic to 0.25 mg weekly.  Also I we will encourage trying to be a little more active to avoid deconditioning where you get out of shape.  Please bring all of your medications to every appointment!  If you have any questions or concerns, please do not hesitate to contact us via phone or MyChart message.   Jerre Simon, MD [facility]

## 2022-05-13 NOTE — Telephone Encounter (Signed)
Spoke with Dr. Elliot Gurney. Received verbal order to change dosage strength to 0.25-0.5 mg pen. Called and canceled 1 mg dose pen. Resent electronic prescription with updated dosing and instructions.   Veronda Prude, RN

## 2022-05-13 NOTE — Assessment & Plan Note (Addendum)
Well-controlled diabetes with A1c today of 6%.  Endorses compliance to home metformin and semaglutide.  Has reported decreased appetite likely secondary to her GLP-1.  Will reduce her semaglutide dose. -Obtained A1c -Decreased semaglutide to 0.25 weekly -Advised patient to complete her annual ophthalmology exam.

## 2022-05-13 NOTE — Assessment & Plan Note (Signed)
>>  ASSESSMENT AND PLAN FOR DM (DIABETES MELLITUS) WITH COMPLICATIONS (HCC) WRITTEN ON 05/13/2022  4:59 PM BY ROSENDO RUSH, MD  Well-controlled diabetes with A1c today of 6%.  Endorses compliance to home metformin  and semaglutide .  Has reported decreased appetite likely secondary to her GLP-1.  Will reduce her semaglutide  dose. -Obtained A1c -Decreased semaglutide  to 0.25 weekly -Advised patient to complete her annual ophthalmology exam.

## 2022-05-13 NOTE — Progress Notes (Signed)
    SUBJECTIVE:   CHIEF COMPLAINT / HPI:   Patient is a 62 year old female with past medical history significant for HTN, 2 diabetes, and ITP presenting today for concerns of increased fatigue in the in the last 4 weeks. Per patient symptom started about after increasing her ozempic to 1mg  weekly. In addition to generalized fatigue patient has reported a decrease in her appetite as well. She is not currently working lives a sedimentary lifestyle but endorses occasionally walking to the store. Denies feeling depressed, no headache, SOB blood in stool or cough. Has had about 10lb weight loss since January.   PERTINENT  PMH / PSH: Reviewed   OBJECTIVE:   BP 118/64   Pulse 95   Ht 5\' 5"  (1.651 m)   Wt 163 lb (73.9 kg)   SpO2 98%   BMI 27.12 kg/m   Flowsheet Row Office Visit from 05/13/2022 in [facility]  PHQ-9 Total Score 0        Physical Exam General: Alert, well appearing, NAD Cardiovascular: RRR, No Murmurs, Normal S2/S2 Respiratory: CTAB, No wheezing or Rales Abdomen: No distension or tenderness Extremities: No edema on extremities     ASSESSMENT/PLAN:   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HCC) Well-controlled diabetes with A1c today of 6%.  Endorses compliance to home metformin and semaglutide.  Has reported decreased appetite likely secondary to her GLP-1.  Will reduce her semaglutide dose. -Obtained A1c -Decreased semaglutide to 0.25 weekly -Advised patient to complete her annual ophthalmology exam.  Increased Fatigue Unclear cause of generalized fatigue but likely deconditioning due to sedimentary lifestyle.  No red flag symptoms but have 10lb weight loss likely from her GLP-1 use.  We will decrease her Ozempic to 0.25 weekly due to patient's concerns for decreased appetite.  Considered differential diagnosis includes thyroid disease, conditioning, anemia, and depression.  -Decreased semaglutide to 0.25 weekly -Obtain labs for TSH, CBC,  BMP -Encouraged increased activity to reduce deconditioning    Jerre Simon, MD [facility]

## 2022-05-13 NOTE — Telephone Encounter (Signed)
Received call from pharmacy regarding Ozempic prescription. Pharmacist needs clarification on dosing/ instructions on Ozempic.   Current rx has direction to inject 0.25 mg weekly, however, dosing is for 1 mg pen.   Please send new prescription. If patient is decreasing dosage, Ozempic 0.25/0.5 mg pen will need to be sent.  Thanks.   Veronda Prude, RN

## 2022-05-14 LAB — CBC WITH DIFFERENTIAL/PLATELET
Basophils Absolute: 0.1 10*3/uL (ref 0.0–0.2)
Basos: 1 %
EOS (ABSOLUTE): 0.1 10*3/uL (ref 0.0–0.4)
Eos: 1 %
Hematocrit: 40.1 % (ref 34.0–46.6)
Hemoglobin: 12.7 g/dL (ref 11.1–15.9)
Immature Grans (Abs): 0 10*3/uL (ref 0.0–0.1)
Immature Granulocytes: 0 %
Lymphocytes Absolute: 3.5 10*3/uL — ABNORMAL HIGH (ref 0.7–3.1)
Lymphs: 42 %
MCH: 24.3 pg — ABNORMAL LOW (ref 26.6–33.0)
MCHC: 31.7 g/dL (ref 31.5–35.7)
MCV: 77 fL — ABNORMAL LOW (ref 79–97)
Monocytes Absolute: 0.5 10*3/uL (ref 0.1–0.9)
Monocytes: 6 %
Neutrophils Absolute: 4.3 10*3/uL (ref 1.4–7.0)
Neutrophils: 50 %
Platelets: 97 10*3/uL — CL (ref 150–450)
RBC: 5.23 x10E6/uL (ref 3.77–5.28)
RDW: 14.9 % (ref 11.7–15.4)
WBC: 8.5 10*3/uL (ref 3.4–10.8)

## 2022-05-14 LAB — BASIC METABOLIC PANEL
BUN/Creatinine Ratio: 11 — ABNORMAL LOW (ref 12–28)
BUN: 10 mg/dL (ref 8–27)
CO2: 26 mmol/L (ref 20–29)
Calcium: 9.8 mg/dL (ref 8.7–10.3)
Chloride: 100 mmol/L (ref 96–106)
Creatinine, Ser: 0.91 mg/dL (ref 0.57–1.00)
Glucose: 97 mg/dL (ref 70–99)
Potassium: 3.7 mmol/L (ref 3.5–5.2)
Sodium: 142 mmol/L (ref 134–144)
eGFR: 71 mL/min/{1.73_m2} (ref >59)

## 2022-05-14 LAB — TSH: TSH: 1.29 u[IU]/mL (ref 0.450–4.500)

## 2022-05-31 NOTE — Progress Notes (Signed)
    SUBJECTIVE:   CHIEF COMPLAINT / HPI:   R chest wall pain Reports pain in R axilla that radiates under R breast. States it feels like a burning sensation. S/p breast reconstruction on the right side after receiving mastectomy for breast cancer removal in 2012. Seen by Plastic Surgery on 05/20/2022 for the same issue and recommended tissue massage to breakdown existing scar tissue. States she has been doing the massage at least 3 times per day and taking Tylenol that helps but still feels like she is having significant pain.  PERTINENT  PMH / PSH: H/o breast cancer with R mastectomy  OBJECTIVE:   BP 122/68   Pulse 96   Ht 5\' 5"  (1.651 m)   Wt 160 lb (72.6 kg)   SpO2 97%   BMI 26.63 kg/m    General: NAD, pleasant, able to participate in exam Breast: R axilla with areas of fibrotic changes. Non-tender to palpation. R breast soft and without discrete mass. No surrounding erythema or edema Cardiac: RRR, no murmurs. Respiratory: CTAB, normal effort, No wheezes, rales or rhonchi Skin: warm and dry, no rashes noted Neuro: alert, no obvious focal deficits Psych: Normal affect and mood  ASSESSMENT/PLAN:   Chest wall pain R axilla and chest wall with intermittent, burning pain. Fibrotic changes in tissue on exam, s/p R mastectomy for breast cancer removal and had subsequent reconstruction. Seen by Plastic Surgery (05/2022) who recommended massage TID to breakdown scar tissue. Some relief with Tylenol. Likely neuropathic pain for muscle tension and fibrous tissue. -Topical lidocaine patches for pain relief -Continue TID massage as tolerated for scar tissue -Trial short course Methocarbamol 500mg  qhs prn due to muscle tension and contracture affecting sleep   Dr. Elberta Fortis, DO [facility]

## 2022-06-03 ENCOUNTER — Ambulatory Visit (INDEPENDENT_AMBULATORY_CARE_PROVIDER_SITE_OTHER): Payer: 59 | Admitting: Family Medicine

## 2022-06-03 ENCOUNTER — Encounter: Payer: Self-pay | Admitting: Family Medicine

## 2022-06-03 VITALS — BP 122/68 | HR 96 | Ht 65.0 in | Wt 160.0 lb

## 2022-06-03 DIAGNOSIS — R0789 Other chest pain: Secondary | ICD-10-CM | POA: Diagnosis not present

## 2022-06-03 DIAGNOSIS — G8918 Other acute postprocedural pain: Secondary | ICD-10-CM

## 2022-06-03 MED ORDER — LIDOCAINE 5 % EX PTCH: 1.0000 | MEDICATED_PATCH | CUTANEOUS | 0 refills | Status: DC

## 2022-06-03 MED ORDER — METHOCARBAMOL 500 MG PO TABS: 500.0000 mg | ORAL_TABLET | Freq: Every evening | ORAL | 0 refills | Status: AC | PRN

## 2022-06-03 NOTE — Patient Instructions (Signed)
It was wonderful to see you today! Thank you for choosing [facility].   Please bring ALL of your medications with you to every visit.   Today we talked about:  I sent in the lidocaine patch that you can apply to the affected area. It lasts for a full day and then you would change it out for another patch. I also sent in a small supply of the muscle relaxer that you can try at night. It can make you sleepy so I would recommend taking before bed and not driving after. Please continue to do the muscle massage as much as possible to help break up the scar tissue. It is likely going to take a while for the scar tissue to break down so this may be an ongoing issue.  Please follow up as need to discuss symptoms and for diabetes management  Call the clinic at [phone] if your symptoms worsen or you have any concerns.  Please be sure to schedule follow up at the front desk before you leave today.   Elberta Fortis, DO Family Medicine

## 2022-06-03 NOTE — Assessment & Plan Note (Signed)
R axilla and chest wall with intermittent, burning pain. Fibrotic changes in tissue on exam, s/p R mastectomy for breast cancer removal and had subsequent reconstruction. Seen by Plastic Surgery (05/2022) who recommended massage TID to breakdown scar tissue. Some relief with Tylenol. Likely neuropathic pain for muscle tension and fibrous tissue. -Topical lidocaine patches for pain relief -Continue TID massage as tolerated for scar tissue -Trial short course Methocarbamol 500mg  qhs prn due to muscle tension and contracture affecting sleep

## 2022-06-14 ENCOUNTER — Telehealth: Payer: Self-pay

## 2022-06-14 NOTE — Telephone Encounter (Signed)
Patient LVM on nurse line regarding issues with Ozempic. Attempted to return call to patient. She did not answer, LVM asking her to return call to office to further discuss.   Veronda Prude, RN

## 2022-06-17 ENCOUNTER — Telehealth: Payer: Self-pay

## 2022-06-17 NOTE — Telephone Encounter (Signed)
A Prior Authorization was initiated for this patients LIDOCAINE PATCHES through CoverMyMeds.   Key: [id number]

## 2022-06-17 NOTE — Telephone Encounter (Signed)
Prior Auth for patients medication LIDOCAINE PATCHES denied by [facility] MEDICARE via CoverMyMeds.   Reason: LIDOCAINE PAD 5% is not FDA approved for your medical condition(s): Chest wall pain following surgery. These condition(s) are not supported by one of the accepted references. Therefore your drug is denied because it is not being used for a "medically accepted indication."  LIDOCAINE 4% PATCHES AVAILABLE OTC  CoverMyMeds Key: [id number]

## 2022-06-26 NOTE — Telephone Encounter (Signed)
Patient returns call to nurse line. She states that she has completely lost her appetite since starting the Ozempic.   She took last dose on Thursday, 5/16. She is wanting to stop taking medication completley, however, wanted to consult with Dr. Elliot Gurney before stopping.   Please advise.   Veronda Prude, RN

## 2022-06-26 NOTE — Telephone Encounter (Signed)
Called patient and provided with message from Dr. Elliot Gurney.   Patient will follow up as needed.   Veronda Prude, RN

## 2022-07-10 ENCOUNTER — Other Ambulatory Visit: Payer: Self-pay | Admitting: Student

## 2022-07-10 DIAGNOSIS — G5700 Lesion of sciatic nerve, unspecified lower limb: Secondary | ICD-10-CM

## 2022-07-10 DIAGNOSIS — E785 Hyperlipidemia, unspecified: Secondary | ICD-10-CM

## 2022-07-23 ENCOUNTER — Ambulatory Visit
Admission: RE | Admit: 2022-07-23 | Discharge: 2022-07-23 | Disposition: A | Discharge status: Home / Self Care | Payer: 59 | Source: Ambulatory Visit | Attending: Family Medicine | Admitting: Family Medicine

## 2022-07-23 DIAGNOSIS — Z1231 Encounter for screening mammogram for malignant neoplasm of breast: Secondary | ICD-10-CM

## 2022-07-26 LAB — HM DIABETES EYE EXAM

## 2022-09-02 ENCOUNTER — Other Ambulatory Visit: Payer: Self-pay | Admitting: Student

## 2022-09-02 DIAGNOSIS — G5700 Lesion of sciatic nerve, unspecified lower limb: Secondary | ICD-10-CM

## 2022-09-03 ENCOUNTER — Other Ambulatory Visit: Payer: Self-pay | Admitting: Student

## 2022-09-03 DIAGNOSIS — I10 Essential (primary) hypertension: Secondary | ICD-10-CM

## 2022-10-08 ENCOUNTER — Telehealth: Payer: Self-pay | Admitting: Internal Medicine

## 2022-10-08 NOTE — Telephone Encounter (Signed)
Pt scheduled to see Dr. Marina Goodell 11/26/22 at 10:20am. Pt aware of appt. Last OV 11/2020. F/U Fatty liver.

## 2022-10-08 NOTE — Telephone Encounter (Signed)
Patient called would like to know when she is due to check her liver again. Please advise. Thanks

## 2022-10-11 ENCOUNTER — Telehealth: Payer: Self-pay

## 2022-10-11 NOTE — Telephone Encounter (Signed)
Patient is requesting a 90 day supply

## 2022-10-14 ENCOUNTER — Other Ambulatory Visit: Payer: Self-pay | Admitting: Student

## 2022-10-14 DIAGNOSIS — E785 Hyperlipidemia, unspecified: Secondary | ICD-10-CM

## 2022-10-24 ENCOUNTER — Ambulatory Visit (INDEPENDENT_AMBULATORY_CARE_PROVIDER_SITE_OTHER): Payer: 59 | Admitting: Student

## 2022-10-24 ENCOUNTER — Encounter: Payer: Self-pay | Admitting: Student

## 2022-10-24 VITALS — BP 135/80 | HR 94 | Temp 98.3°F | Ht 65.0 in | Wt 177.8 lb

## 2022-10-24 DIAGNOSIS — M791 Myalgia, unspecified site: Secondary | ICD-10-CM

## 2022-10-24 DIAGNOSIS — E118 Type 2 diabetes mellitus with unspecified complications: Secondary | ICD-10-CM

## 2022-10-24 DIAGNOSIS — Z23 Encounter for immunization: Secondary | ICD-10-CM

## 2022-10-24 LAB — POCT GLYCOSYLATED HEMOGLOBIN (HGB A1C): HbA1c, POC (controlled diabetic range): 6.6 % (ref 0.0–7.0)

## 2022-10-24 MED ORDER — EZETIMIBE 10 MG PO TABS
10.0000 mg | ORAL_TABLET | Freq: Every day | ORAL | 3 refills | Status: DC
Start: 2022-10-24 — End: 2023-10-27

## 2022-10-24 MED ORDER — SEMAGLUTIDE(0.25 OR 0.5MG/DOS) 2 MG/1.5ML ~~LOC~~ SOPN
0.5000 mg | PEN_INJECTOR | SUBCUTANEOUS | 2 refills | Status: DC
Start: 2022-10-24 — End: 2023-02-04

## 2022-10-24 NOTE — Patient Instructions (Signed)
It was wonderful to see you today. Thank you for allowing me to be a part of your care. Below is a short summary of what we discussed at your visit today:  Your A1c increased from 5 months ago. Today it is 6.6%  I have restarted your Ozempic to 0.5mg  weekly. If well tolerated after a month we can go up to 1mg  weekly.  I ordered lab to check your cholesteral, liver enzyme and muscle enzyme  Being that you are having muscle pain I have changed your statin to Zetia.  Please bring all of your medications to every appointment!  If you have any questions or concerns, please do not hesitate to contact us via phone or MyChart message.   Jerre Simon, MD [facility]

## 2022-10-24 NOTE — Assessment & Plan Note (Addendum)
Well controlled with slightly increased A1c from 5 months ago at 6.6% after discontinuing Ozempic for 5 months. Ozempic at the time was discontinued at the time due to bloating and decreased appetite.  Patient complained of muscle ache and suspect this could be attributed to her statin.  Patient reports she has well-documented history of intolerance to statin has tried atorvastatin, rosuvastatin, pravastatin and simvastatin. Will look to change her statin. -Obtained A1c -Ordered labs for lipid panel, CMP and CK -Restarted patient on Ozempic 0.5 mg weekly with plans to titrate up to 1 mg if tolerated in a month. -Discontinued Crestor and started patient on Zetia 10 mg daily -Follow-up in 1 months to evaluate muscle pain, tolerance to Ozempic and Zetia -Will to obtain CBC today and give a remote history of thrombocytopenia with Zetia and patient's history of thrombocytopenia we will obtain CBC at follow-up appointment.  Consider hematology referral for chronic thrombocytopenia.

## 2022-10-24 NOTE — Assessment & Plan Note (Signed)
>>  ASSESSMENT AND PLAN FOR DM (DIABETES MELLITUS) WITH COMPLICATIONS (HCC) WRITTEN ON 10/24/2022 12:39 PM BY ROSENDO RUSH, MD  Well controlled with slightly increased A1c from 5 months ago at 6.6% after discontinuing Ozempic  for 5 months. Ozempic  at the time was discontinued at the time due to bloating and decreased appetite.  Patient complained of muscle ache and suspect this could be attributed to her statin.  Patient reports she has well-documented history of intolerance to statin has tried atorvastatin , rosuvastatin , pravastatin  and simvastatin . Will look to change her statin. -Obtained A1c -Ordered labs for lipid panel, CMP and CK -Restarted patient on Ozempic  0.5 mg weekly with plans to titrate up to 1 mg if tolerated in a month. -Discontinued Crestor  and started patient on Zetia  10 mg daily -Follow-up in 1 months to evaluate muscle pain, tolerance to Ozempic  and Zetia  -Will to obtain CBC today and give a remote history of thrombocytopenia with Zetia  and patient's history of thrombocytopenia we will obtain CBC at follow-up appointment.  Consider hematology referral for chronic thrombocytopenia.

## 2022-10-24 NOTE — Progress Notes (Signed)
    SUBJECTIVE:   CHIEF COMPLAINT / HPI:   Diabetes, Type 2 - Last A1c - 6.0% from 5 months ago - Medications: Metformin 500mg  BID. Patient is supposed to be on 1000mg  BID - Compliance: Good - Checking BG at home: 120s - Diet: Ok - Exercise: walk an hour every day - Eye exam: up to date - Foot exam: Due  - Statin: On Crestor 3 times a week, reports muscle pain  - Denies symptoms of hypoglycemia, polyuria, polydipsia, numbness extremities, foot ulcers/trauma    PERTINENT  PMH / PSH: Reviewed   OBJECTIVE:   BP 135/80   Pulse 94   Temp 98.3 F (36.8 C)   Ht 5\' 5"  (1.651 m)   Wt 177 lb 12.8 oz (80.6 kg)   SpO2 97%   BMI 29.59 kg/m    Physical Exam General: Alert, well appearing, NAD, Oriented x4 Cardiovascular: RRR, No Murmurs, Normal S2/S2 Respiratory: CTAB, No wheezing or Rales Abdomen: No distension or tenderness Extremities: No edema on extremities   Skin: Warm and dry  ASSESSMENT/PLAN:   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HCC) Well controlled with slightly increased A1c from 5 months ago at 6.6% after discontinuing Ozempic for 5 months. Ozempic at the time was discontinued at the time due to bloating and decreased appetite.  Patient complained of muscle ache and suspect this could be attributed to her statin.  Patient reports she has well-documented history of intolerance to statin has tried atorvastatin, rosuvastatin, pravastatin and simvastatin. Will look to change her statin. -Obtained A1c -Ordered labs for lipid panel, CMP and CK -Restarted patient on Ozempic 0.5 mg weekly with plans to titrate up to 1 mg if tolerated in a month. -Discontinued Crestor and started patient on Zetia 10 mg daily -Follow-up in 1 months to evaluate muscle pain, tolerance to Ozempic and Zetia -Will to obtain CBC today and give a remote history of thrombocytopenia with Zetia and patient's history of thrombocytopenia we will obtain CBC at follow-up appointment.  Consider  hematology referral for chronic thrombocytopenia.   Jerre Simon, MD [facility]

## 2022-10-25 LAB — COMPREHENSIVE METABOLIC PANEL
ALT: 12 IU/L (ref 0–32)
AST: 15 IU/L (ref 0–40)
Albumin: 4.3 g/dL (ref 3.9–4.9)
Alkaline Phosphatase: 51 IU/L (ref 44–121)
BUN/Creatinine Ratio: 12 (ref 12–28)
BUN: 11 mg/dL (ref 8–27)
Bilirubin Total: 0.3 mg/dL (ref 0.0–1.2)
CO2: 25 mmol/L (ref 20–29)
Calcium: 9.7 mg/dL (ref 8.7–10.3)
Chloride: 99 mmol/L (ref 96–106)
Creatinine, Ser: 0.92 mg/dL (ref 0.57–1.00)
Globulin, Total: 2.4 g/dL (ref 1.5–4.5)
Glucose: 127 mg/dL — ABNORMAL HIGH (ref 70–99)
Potassium: 4.2 mmol/L (ref 3.5–5.2)
Sodium: 141 mmol/L (ref 134–144)
Total Protein: 6.7 g/dL (ref 6.0–8.5)
eGFR: 70 mL/min/{1.73_m2} (ref >59)

## 2022-10-25 LAB — CK: Total CK: 118 U/L (ref 32–182)

## 2022-10-29 ENCOUNTER — Other Ambulatory Visit: Payer: Self-pay | Admitting: Student

## 2022-10-29 DIAGNOSIS — G5700 Lesion of sciatic nerve, unspecified lower limb: Secondary | ICD-10-CM

## 2022-10-30 ENCOUNTER — Other Ambulatory Visit: Payer: Self-pay | Admitting: Student

## 2022-10-30 DIAGNOSIS — G5700 Lesion of sciatic nerve, unspecified lower limb: Secondary | ICD-10-CM

## 2022-11-08 ENCOUNTER — Other Ambulatory Visit: Payer: Self-pay | Admitting: Student

## 2022-11-08 DIAGNOSIS — I10 Essential (primary) hypertension: Secondary | ICD-10-CM

## 2022-11-11 ENCOUNTER — Other Ambulatory Visit: Payer: Self-pay | Admitting: Student

## 2022-11-11 DIAGNOSIS — I10 Essential (primary) hypertension: Secondary | ICD-10-CM

## 2022-11-21 ENCOUNTER — Ambulatory Visit: Payer: 59 | Admitting: Student

## 2022-11-26 ENCOUNTER — Ambulatory Visit: Payer: 59 | Admitting: Internal Medicine

## 2022-11-26 ENCOUNTER — Encounter: Payer: Self-pay | Admitting: Internal Medicine

## 2022-11-26 ENCOUNTER — Other Ambulatory Visit (INDEPENDENT_AMBULATORY_CARE_PROVIDER_SITE_OTHER): Payer: 59

## 2022-11-26 VITALS — BP 140/80 | HR 101 | Ht 63.0 in | Wt 171.0 lb

## 2022-11-26 DIAGNOSIS — K760 Fatty (change of) liver, not elsewhere classified: Secondary | ICD-10-CM

## 2022-11-26 DIAGNOSIS — Z1211 Encounter for screening for malignant neoplasm of colon: Secondary | ICD-10-CM

## 2022-11-26 LAB — CBC WITH DIFFERENTIAL/PLATELET
Basophils Absolute: 0.1 10*3/uL (ref 0.0–0.1)
Basophils Relative: 1.1 % (ref 0.0–3.0)
Eosinophils Absolute: 0.1 10*3/uL (ref 0.0–0.7)
Eosinophils Relative: 1.1 % (ref 0.0–5.0)
HCT: 44.1 % (ref 36.0–46.0)
Hemoglobin: 14 g/dL (ref 12.0–15.0)
Lymphocytes Relative: 32.2 % (ref 12.0–46.0)
Lymphs Abs: 2.3 10*3/uL (ref 0.7–4.0)
MCHC: 31.7 g/dL (ref 30.0–36.0)
MCV: 75.8 fL — ABNORMAL LOW (ref 78.0–100.0)
Monocytes Absolute: 0.4 10*3/uL (ref 0.1–1.0)
Monocytes Relative: 6.2 % (ref 3.0–12.0)
Neutro Abs: 4.2 10*3/uL (ref 1.4–7.7)
Neutrophils Relative %: 59.4 % (ref 43.0–77.0)
Platelets: 91 10*3/uL — ABNORMAL LOW (ref 150.0–400.0)
RBC: 5.81 Mil/uL — ABNORMAL HIGH (ref 3.87–5.11)
RDW: 15.5 % (ref 11.5–15.5)
WBC: 7.1 10*3/uL (ref 4.0–10.5)

## 2022-11-26 LAB — COMPREHENSIVE METABOLIC PANEL
ALT: 10 U/L (ref 0–35)
AST: 14 U/L (ref 0–37)
Albumin: 4.5 g/dL (ref 3.5–5.2)
Alkaline Phosphatase: 46 U/L (ref 39–117)
BUN: 10 mg/dL (ref 6–23)
CO2: 32 meq/L (ref 19–32)
Calcium: 10.7 mg/dL — ABNORMAL HIGH (ref 8.4–10.5)
Chloride: 97 meq/L (ref 96–112)
Creatinine, Ser: 0.86 mg/dL (ref 0.40–1.20)
GFR: 72.26 mL/min (ref >60.00)
Glucose, Bld: 114 mg/dL — ABNORMAL HIGH (ref 70–99)
Potassium: 3.5 meq/L (ref 3.5–5.1)
Sodium: 139 meq/L (ref 135–145)
Total Bilirubin: 0.6 mg/dL (ref 0.2–1.2)
Total Protein: 8.1 g/dL (ref 6.0–8.3)

## 2022-11-26 LAB — PROTIME-INR
INR: 1.1 {ratio} — ABNORMAL HIGH (ref 0.8–1.0)
Prothrombin Time: 12 s (ref 9.6–13.1)

## 2022-11-26 NOTE — Progress Notes (Signed)
HISTORY OF PRESENT ILLNESS:  [name] is a 62 y.o. female with past medical history as listed below.  She has been followed in this office for fatty liver disease and colon cancer screening.  Last seen in the office October 2022.  See that dictation.  Last screening colonoscopy March 2024.  Examination revealed diverticulosis but was otherwise normal.  Follow-up in 10 years recommended.  Patient states that she has been doing well.  Review of limited blood work from September 2024 shows normal liver tests.  She is known to have chronic thrombocytopenia.  Previous hepatic imaging revealed hepatic cysts and hepatic steatosis.  Elastography did not show any significant fibrosis.  The patient is now on semaglutide once weekly.  GI review of systems is negative  REVIEW OF SYSTEMS:  All non-GI ROS negative unless otherwise stated in the HPI except for arthritis  Past Medical History:  Diagnosis Date   Arthritis    Breast CA (HCC)    breast CA- right breast   Diabetes mellitus    Hyperlipidemia    Hypertension    Thrombocytopenia (HCC)    Thyroid disease    parathyroid removed    Past Surgical History:  Procedure Laterality Date   ABDOMINAL HYSTERECTOMY     1996 per pt   BREAST BIOPSY Right 06/2009   BREAST SURGERY  09/12/2010   excision of skin mass - right breast, had another surgery to remove more Cancer cells   COLONOSCOPY     INNER EAR SURGERY  06/01/2013   MASTECTOMY Right 07/21/2009   PARATHYROIDECTOMY     partial?   REDUCTION MAMMAPLASTY Left 2013    Social History [name]  reports that she has never smoked. She has been exposed to tobacco smoke. She has never used smokeless tobacco. She reports that she does not drink alcohol and does not use drugs.  family history includes CAD (age of onset: 26) in her mother; Colon polyps in her sister; Heart attack (age of onset: 75) in her brother; Stroke in her father.  No Known Allergies     PHYSICAL  EXAMINATION: Vital signs: BP (!) 140/80   Pulse (!) 101   Ht 5\' 3"  (1.6 m)   Wt 171 lb (77.6 kg)   BMI 30.29 kg/m   Constitutional: generally well-appearing, no acute distress Psychiatric: alert and oriented x3, cooperative Eyes: extraocular movements intact, anicteric, conjunctiva pink Mouth: oral pharynx moist, no lesions Neck: supple no lymphadenopathy Cardiovascular: heart regular rate and rhythm, no murmur Lungs: clear to auscultation bilaterally Abdomen: soft, nontender, nondistended, no obvious ascites, no peritoneal signs, normal bowel sounds, no organomegaly Rectal: Omitted Extremities: no clubbing, cyanosis, or lower extremity edema bilaterally Skin: no lesions on visible extremities Neuro: No focal deficits. No asterixis.    ASSESSMENT:  1.  Fatty liver disease 2.  Obesity.  Has achieved weight loss on semaglutide 3.  Colon cancer screening.  Up-to-date   PLAN:  1.  Laboratories today including CBC, comprehensive metabolic panel, PT/INR, AFP 2.  Liver ultrasound to rule out progression to cirrhosis and/or hepatic mass.  Noted to have multiple hepatic cysts. 3.  Routine office follow-up 1 year 4.  Repeat screening colonoscopy 10 years 5.  Ongoing general medical care with PCP A total time of 30 minutes was spent preparing to see the patient, obtaining comprehensive history, forming medically appropriate physical examination, ordering multiple laboratories, ordering advanced radiology study, defining follow-up interval, and documenting clinical information in the health record

## 2022-11-26 NOTE — Patient Instructions (Addendum)
Your provider has requested that you go to the basement level for lab work before leaving today. Press "B" on the elevator. The lab is located at the first door on the left as you exit the elevator.  You will be contacted by [facility] Scheduling in the next 2 days to arrange an ultrasound.  The number on your caller ID will be [phone], please answer when they call.  If you have not heard from them in 2 days please call [phone] to schedule.   _______________________________________________________  Please follow up in one year.   If your blood pressure at your visit was 140/90 or greater, please contact your primary care physician to follow up on this.  _______________________________________________________  If you are age 62 or older, your body mass index should be between 23-30. Your Body mass index is 30.29 kg/m. If this is out of the aforementioned range listed, please consider follow up with your Primary Care Provider.  If you are age 37 or younger, your body mass index should be between 19-25. Your Body mass index is 30.29 kg/m. If this is out of the aformentioned range listed, please consider follow up with your Primary Care Provider.   ________________________________________________________  The [facility] providers would like to encourage you to use [facility] to communicate with providers for non-urgent requests or questions.  Due to long hold times on the telephone, sending your provider a message by [facility] may be a faster and more efficient way to get a response.  Please allow 48 business hours for a response.  Please remember that this is for non-urgent requests.  _______________________________________________________   Your provider has requested that you go to the basement level for lab work before leaving today. Press "B" on the elevator. The lab is located at the first door on the left as you exit the elevator.

## 2022-11-27 LAB — AFP TUMOR MARKER: AFP-Tumor Marker: 2.4 ng/mL

## 2022-11-29 ENCOUNTER — Other Ambulatory Visit: Payer: Self-pay | Admitting: Student

## 2022-11-29 DIAGNOSIS — G5700 Lesion of sciatic nerve, unspecified lower limb: Secondary | ICD-10-CM

## 2022-12-02 ENCOUNTER — Other Ambulatory Visit: Payer: Self-pay | Admitting: Student

## 2022-12-02 DIAGNOSIS — G5700 Lesion of sciatic nerve, unspecified lower limb: Secondary | ICD-10-CM

## 2022-12-03 ENCOUNTER — Ambulatory Visit (HOSPITAL_COMMUNITY)
Admission: RE | Admit: 2022-12-03 | Discharge: 2022-12-03 | Disposition: A | Discharge status: Home / Self Care | Payer: 59 | Source: Ambulatory Visit | Attending: Internal Medicine | Admitting: Internal Medicine

## 2022-12-03 DIAGNOSIS — K760 Fatty (change of) liver, not elsewhere classified: Secondary | ICD-10-CM | POA: Insufficient documentation

## 2022-12-09 ENCOUNTER — Other Ambulatory Visit: Payer: Self-pay | Admitting: Student

## 2023-01-13 ENCOUNTER — Other Ambulatory Visit: Payer: Self-pay | Admitting: Student

## 2023-01-13 DIAGNOSIS — I10 Essential (primary) hypertension: Secondary | ICD-10-CM

## 2023-02-03 ENCOUNTER — Other Ambulatory Visit: Payer: Self-pay | Admitting: Student

## 2023-02-03 ENCOUNTER — Ambulatory Visit
Admission: EM | Admit: 2023-02-03 | Discharge: 2023-02-03 | Disposition: A | Discharge status: Home / Self Care | Payer: 59

## 2023-02-03 DIAGNOSIS — H6122 Impacted cerumen, left ear: Secondary | ICD-10-CM

## 2023-02-03 DIAGNOSIS — E118 Type 2 diabetes mellitus with unspecified complications: Secondary | ICD-10-CM

## 2023-02-03 NOTE — ED Triage Notes (Signed)
See Med list for Last Glucose.

## 2023-02-03 NOTE — ED Triage Notes (Signed)
"  My left ear is hurting the last few days". No others symptoms of concern. No injury. No fever. No discharge from ear.

## 2023-02-03 NOTE — ED Notes (Signed)
Directions provided for Colace (home use prn).

## 2023-02-03 NOTE — ED Provider Notes (Incomplete)
[facility]    CSN: [id number] Arrival date & time: 02/03/23  0845      History   Chief Complaint Chief Complaint  Patient presents with   Otalgia    HPI [name] is a 62 y.o. female.    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/23/2018   Hepatic lesion 08/11/2018   Generalized edema 07/14/2018   Need for immunization against influenza 12/04/2017   Arthritis of both hands 04/03/2016   Obesity 08/30/2015   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 02/17/2015   Cataracts, bilateral 09/13/2014   Glaucoma 09/13/2014   Mitral regurgitation 02/18/2014   Hip pain 01/07/2014   Cerumen impaction 01/06/2013   Transaminitis 11/13/2012   Pedal edema 04/04/2011   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HCC) 06/07/2010   Diabetes (HCC) 06/07/2010   Cancer of central portion of right female breast (HCC) 10/04/2009   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   REDUCTION MAMMAPLASTY Left 2013    OB History   No obstetric history on file.      Home Medications    Prior to Admission medications   Medication Sig Start Date End Date Taking? Authorizing Provider  OZEMPIC, 0.25 OR 0.5 MG/DOSE, 2 MG/3ML SOPN Inject 0.25 mg into the skin once a week. 05/13/22  Yes [provider]  Blood Glucose Monitoring Suppl (ONETOUCH VERIO REFLECT) w/Device KIT USE TO CHECK BLOOD SUGAR ONCE PER DAY 12/04/21   Jerre Simon, MD  carvedilol (COREG) 12.5 MG tablet TAKE 1 TABLET BY MOUTH TWICE DAILY WITH A MEAL 09/03/22   Jerre Simon, MD  ezetimibe (ZETIA) 10 MG tablet Take 1 tablet (10 mg total) by mouth daily. 10/24/22   Jerre Simon, MD  glucose blood [facility] VERIO) test strip Use as instructed 12/31/21   Jerre Simon, MD  hydrochlorothiazide (HYDRODIURIL) 25 MG tablet Take 1 tablet by mouth once daily 01/13/23   Jerre Simon, MD  Lancet Devices (ONE TOUCH DELICA LANCING DEV) MISC Please use to check blood sugar once per day. E11.9 05/29/20   Celedonio Savage, MD  Lancets [facility] DELICA PLUS LANCET33G) MISC USE  1 TO CHECK GLUCOSE ONCE DAILY 06/05/21   Marcheta Grammes  V, MD  lidocaine (LIDODERM) 5 % Place 1 patch onto the skin daily. Remove & Discard patch within 12 hours or as directed by MD 06/03/22   Elberta Fortis, MD  lisinopril (ZESTRIL) 40 MG tablet Take 1 tablet by mouth once daily 11/09/22   Jerre Simon, MD  metFORMIN (GLUCOPHAGE-XR) 500 MG 24 hr tablet TAKE 2 TABLETS BY MOUTH TWICE DAILY WITH AMEAL. 12/09/22   Jerre Simon, MD  polyethylene glycol powder [facility]) 17 GM/SCOOP powder Take 17 g by mouth 2 (two) times daily as needed for moderate constipation. 12/04/21   Jerre Simon, MD  traMADol Janean Sark) 50 MG tablet TAKE 1 TABLET BY MOUTH EVERY 6 HOURS AS NEEDED 12/02/22    Jerre Simon, MD    Family History Family History  Problem Relation Age of Onset   CAD Mother 20       CABG   Stroke Father    Colon polyps Sister    Heart attack Brother 93   Colon cancer Neg Hx    Esophageal cancer Neg Hx    Rectal cancer Neg Hx    Stomach cancer Neg Hx     Social History Social History   Tobacco Use   Smoking status: Never    Passive exposure: Past   Smokeless tobacco: Never  Vaping Use   Vaping status: Never Used  Substance Use Topics   Alcohol use: No   Drug use: No     Allergies   Patient has no known allergies.   Review of Systems Review of Systems  HENT:  Positive for ear pain.      Physical Exam Triage Vital Signs ED Triage Vitals  Encounter Vitals Group     BP --      Systolic BP Percentile --      Diastolic BP Percentile --      Pulse --      Resp --      Temp --      Temp src --      SpO2 --      Weight 02/03/23 0857 171 lb 1.2 oz (77.6 kg)     Height 02/03/23 0857 5\' 3"  (1.6 m)     Head Circumference --      Peak Flow --      Pain Score 02/03/23 0856 7     Pain Loc --      Pain Education --      Exclude from Growth Chart --    No data found.  Updated Vital Signs Ht 5\' 3"  (1.6 m)   Wt 171 lb 1.2 oz (77.6 kg)   BMI 30.30 kg/m   Visual Acuity Right Eye Distance:   Left Eye Distance:   Bilateral Distance:    Right Eye Near:   Left Eye Near:    Bilateral Near:     Physical Exam   UC Treatments / Results  Labs (all labs ordered are listed, but only abnormal results are displayed) Labs Reviewed - No data to display  EKG   Radiology No results found.  Procedures Procedures (including critical care time)  Medications Ordered in UC Medications - No data to display  Initial Impression / Assessment and Plan / UC Course  I have reviewed the triage vital signs and the nursing notes.  Pertinent labs & imaging results that were available during my care of the patient were reviewed by me and  considered in my medical decision making (see chart for details).     ***  Final Clinical Impressions(s) / UC Diagnoses   Final diagnoses:  None   Discharge Instructions   None    ED Prescriptions   None    PDMP not reviewed this encounter.

## 2023-02-06 ENCOUNTER — Ambulatory Visit: Payer: 59 | Admitting: Student

## 2023-02-06 VITALS — BP 130/86 | HR 82 | Ht 63.0 in | Wt 163.1 lb

## 2023-02-06 DIAGNOSIS — H9202 Otalgia, left ear: Secondary | ICD-10-CM

## 2023-02-06 DIAGNOSIS — R0981 Nasal congestion: Secondary | ICD-10-CM

## 2023-02-06 MED ORDER — FLUTICASONE PROPIONATE 50 MCG/ACT NA SUSP: 2.0000 | Freq: Every day | NASAL | 0 refills | Status: DC

## 2023-02-06 NOTE — Progress Notes (Signed)
    SUBJECTIVE:   CHIEF COMPLAINT / HPI:   Otalgia Left ear otalgia for 1 week, mild improvement today.  Seen in urgent care on Monday, had ear irrigated.  Pain is 4/10 today.  She has some discomfort at her TMJ, but denies dental pain.  She also endorses nasal congestion has been present with the otalgia.  No hearing loss, tinnitus, dizziness.  On further review of chart, she appears to have had a left ear canal mass, resected in 2015-possibly a cholesteatoma.   OBJECTIVE:   BP 130/86   Pulse 82   Ht 5' 3 (1.6 m)   Wt 163 lb 2 oz (74 kg)   SpO2 98%   BMI 28.90 kg/m    General: NAD, pleasant HEENT: Normocephalic, atraumatic head. Normal external ear, canal, TM bilaterally. EOM intact and normal conjunctiva BL. Normal external nose. Throat not erythematous, no exudate, no deviation.  Missing multiple teeth, no overt dental caries. Cardio: RRR, no MRG. Cap Refill <2s. Respiratory: CTAB, normal wob on RA  ASSESSMENT/PLAN:   Assessment & Plan Left ear pain No signs of otitis media/externa.  History of mass, no mass present on exam.  Suspect secondary cause of otalgia.  Differential includes: Rhinitis, eustachian tube dysfunction, TMJ, dental caries.  Unable to take NSAIDs due to thrombocytopenia. - Trial Flonase  daily - Recommend visit with dentist - If symptoms do not improve, recommend referral to ENT given prior history of ear canal mass  Danielle Church, DO [facility]

## 2023-02-06 NOTE — Patient Instructions (Signed)
 It was great to see you! Thank you for allowing me to participate in your care!   I recommend that you always bring your medications to each appointment as this makes it easy to ensure we are on the correct medications and helps us  not miss when refills are needed.  Our plans for today:  -Take Flonase , 2 sprays in each nostril twice daily for 7 days  Take care and seek immediate care sooner if you develop any concerns. Please remember to show up 15 minutes before your scheduled appointment time!  Gladis Church, DO [facility]

## 2023-02-07 ENCOUNTER — Other Ambulatory Visit: Payer: Self-pay | Admitting: Student

## 2023-02-07 DIAGNOSIS — I10 Essential (primary) hypertension: Secondary | ICD-10-CM

## 2023-02-09 ENCOUNTER — Other Ambulatory Visit: Payer: Self-pay | Admitting: Student

## 2023-02-09 DIAGNOSIS — I10 Essential (primary) hypertension: Secondary | ICD-10-CM

## 2023-02-21 ENCOUNTER — Ambulatory Visit (INDEPENDENT_AMBULATORY_CARE_PROVIDER_SITE_OTHER): Payer: 59 | Admitting: Student

## 2023-02-21 ENCOUNTER — Encounter: Payer: Self-pay | Admitting: Student

## 2023-02-21 VITALS — BP 124/67 | HR 91 | Ht 62.0 in | Wt 160.2 lb

## 2023-02-21 DIAGNOSIS — H9202 Otalgia, left ear: Secondary | ICD-10-CM

## 2023-02-21 DIAGNOSIS — Z7984 Long term (current) use of oral hypoglycemic drugs: Secondary | ICD-10-CM

## 2023-02-21 DIAGNOSIS — H938X2 Other specified disorders of left ear: Secondary | ICD-10-CM | POA: Diagnosis not present

## 2023-02-21 DIAGNOSIS — E118 Type 2 diabetes mellitus with unspecified complications: Secondary | ICD-10-CM

## 2023-02-21 NOTE — Patient Instructions (Signed)
Great to see you today.  Glad to hear that your ear pain has since improved and you not have any difficulties with hearing.  Should the pain return make sure to see your ENT/ear doctor.  Today I discontinued your Ozempic due to your issues with appetite on the medication.  Your occasional elevated heart rate could be due to you not taking your carvedilol/Coreg 2 times daily.  Please make sure you are taking the medication as prescribed.  Your heart rate and blood pressure today was normal.  Return in 2 months lets recheck your A1c and if it is elevated will increase your metformin to 1000 mg 2 times a day.

## 2023-02-21 NOTE — Progress Notes (Signed)
    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 Today patient says symptom has since resolved.  No longer having pain and not having any issues with ringing in the ear or diminished hearing.  PERTINENT  PMH / PSH: Reviewed   OBJECTIVE:   BP 124/67   Pulse 91   Ht 5\' 2"  (1.575 m)   Wt 160 lb 3.2 oz (72.7 kg)   SpO2 100%   BMI 29.30 kg/m    Physical Exam General: Alert, well appearing, NAD Ear: 2 cm mass in the right ear canal which is chronic, cerumen impaction on right ear able to visualize tympanic membrane.  Hearing intact Cardiovascular: RRR, No Murmurs, Normal S2/S2 Respiratory: CTAB, No wheezing or Rales   ASSESSMENT/PLAN:   Diabetes (HCC) Generally well-controlled with most recent A1c of 6.6.  Current medication includes metformin and Ozempic.  However patient expressed desire to discontinue Ozempic due to decreased appetite with the medication. -Ozempic discontinued -A1c to be rechecked in 2 months -We will consider increasing metformin if A1c increases after discontinuing Ozempic.  Left Ear pain Unclear cause of patient's left ear pain however patient does have well-documented history of left ear canal mass which was visualized on exam.  Ear does not look infected and no signs of drainage or bleeding from the mass.  Given resolution of pain and hearing appears affected we will manage conservatively.  Advised patient's should pain recur or to follow-up with ENT given they have been following up closely.  ED precautions provided.    Jerre Simon, MD [facility]

## 2023-02-21 NOTE — Assessment & Plan Note (Signed)
Generally well-controlled with most recent A1c of 6.6.  Current medication includes metformin and Ozempic.  However patient expressed desire to discontinue Ozempic due to decreased appetite with the medication. -Ozempic discontinued -A1c to be rechecked in 2 months -We will consider increasing metformin if A1c increases after discontinuing Ozempic.

## 2023-02-24 ENCOUNTER — Other Ambulatory Visit: Payer: Self-pay | Admitting: Student

## 2023-02-24 DIAGNOSIS — I10 Essential (primary) hypertension: Secondary | ICD-10-CM

## 2023-02-27 ENCOUNTER — Encounter: Payer: Self-pay | Admitting: Student

## 2023-02-27 ENCOUNTER — Ambulatory Visit (INDEPENDENT_AMBULATORY_CARE_PROVIDER_SITE_OTHER): Payer: 59 | Admitting: Student

## 2023-02-27 ENCOUNTER — Ambulatory Visit: Payer: 59 | Attending: Family Medicine

## 2023-02-27 ENCOUNTER — Other Ambulatory Visit: Payer: Self-pay

## 2023-02-27 VITALS — BP 138/65 | HR 93 | Ht 62.0 in | Wt 161.0 lb

## 2023-02-27 DIAGNOSIS — R002 Palpitations: Secondary | ICD-10-CM

## 2023-02-27 NOTE — Progress Notes (Signed)
    SUBJECTIVE:   CHIEF COMPLAINT / HPI:   Palpitations Present for the past few weeks.  Not accompanied by any chest pain or shortness of breath.  She initially thought that these might be related to her Ozempic so she has since stopped her Ozempic.  She will often check her heart rate when this is happening and finds that it is usually just in the 90s, sometimes in the low 100s.  Never any higher than this. She is already taking Coreg for her hypertension. Notes that these palpitations have contributed to quite a bit of anxiety.  On chart review, seems she was seen for a similar issue in 2017 with referral to cardiology and normal stress testing at that time.   OBJECTIVE:   BP 138/65   Pulse 93   Ht 5\' 2"  (1.575 m)   Wt 161 lb (73 kg)   SpO2 100%   BMI 29.45 kg/m   Gen: Well appearing and NAD  HEENT: normocephalic, atraumatic, EOM grossly intact, oral mucosa moist, neck supple Cardio: RRR without murmur, rub, or gallop  Respiratory: normal respiratory effort GI: non-distended Skin: no rashes, no jaundice Psych: appropriate mood and affect  EKG shows normal sinus rhythm with a rate of 85 and two PVCs. Intervals are normal. No STE or depression.   ASSESSMENT/PLAN:   Palpitations Two PVCs on EKG tracing in clinic today. Wonder if this is the driver of her symptoms? Quick literature review in clinic shows some suggestion of increased PVC with Ozempic. Seems reasonable for her to stay off of this for now. - CBC, TSH, BMP, Mg - 14 day Zio patch - She is already on a beta-blocker - Follow-up pending results of the above workup - Overall reassuring picture today, offered assurance.  - If workup is unremarkable and it seems that her anxiety is a major driver here, I would consider treatment for anxiety moving forward     J Dorothyann Gibbs, MD [facility]

## 2023-02-27 NOTE — Assessment & Plan Note (Addendum)
Two PVCs on EKG tracing in clinic today. Wonder if this is the driver of her symptoms? Quick literature review in clinic shows some suggestion of increased PVC with Ozempic. Seems reasonable for her to stay off of this for now. - CBC, TSH, BMP, Mg - 14 day Zio patch - She is already on a beta-blocker - Follow-up pending results of the above workup - Overall reassuring picture today, offered assurance.  - If workup is unremarkable and it seems that her anxiety is a major driver here, I would consider treatment for anxiety moving forward

## 2023-02-27 NOTE — Patient Instructions (Signed)
Ms. Murrie,  I think stopping the ozempic for now makes sense, but hopefully we dont need you to be off of this forever.  Let's check some labs today. I will call you Saturday evening with results if anything is abnormal or shoot you a MyCHart message if all is normal. You will get a 14 day heart monitor in the mail.  IF you get chest pain or Shortness of Breath with these, come back to see [place] immediately or go to the ER.  Eliezer Mccoy, MD

## 2023-02-27 NOTE — Progress Notes (Unsigned)
EP to read

## 2023-02-28 ENCOUNTER — Other Ambulatory Visit: Payer: 59

## 2023-02-28 DIAGNOSIS — R002 Palpitations: Secondary | ICD-10-CM

## 2023-02-28 LAB — CBC
Hematocrit: 41.3 % (ref 34.0–46.6)
Hemoglobin: 13.6 g/dL (ref 11.1–15.9)
MCH: 25 pg — ABNORMAL LOW (ref 26.6–33.0)
MCHC: 32.9 g/dL (ref 31.5–35.7)
MCV: 76 fL — ABNORMAL LOW (ref 79–97)
Platelets: 101 10*3/uL — ABNORMAL LOW (ref 150–450)
RBC: 5.43 x10E6/uL — ABNORMAL HIGH (ref 3.77–5.28)
RDW: 13.7 % (ref 11.7–15.4)
WBC: 6.8 10*3/uL (ref 3.4–10.8)

## 2023-02-28 LAB — MAGNESIUM: Magnesium: 1.3 mg/dL — ABNORMAL LOW (ref 1.6–2.3)

## 2023-02-28 LAB — BASIC METABOLIC PANEL
BUN/Creatinine Ratio: 15 (ref 12–28)
BUN: 14 mg/dL (ref 8–27)
CO2: 25 mmol/L (ref 20–29)
Calcium: 10.5 mg/dL — ABNORMAL HIGH (ref 8.7–10.3)
Chloride: 95 mmol/L — ABNORMAL LOW (ref 96–106)
Creatinine, Ser: 0.94 mg/dL (ref 0.57–1.00)
Glucose: 112 mg/dL — ABNORMAL HIGH (ref 70–99)
Potassium: 3.7 mmol/L (ref 3.5–5.2)
Sodium: 140 mmol/L (ref 134–144)
eGFR: 69 mL/min/{1.73_m2} (ref >59)

## 2023-02-28 LAB — TSH RFX ON ABNORMAL TO FREE T4: TSH: 1.53 u[IU]/mL (ref 0.450–4.500)

## 2023-02-28 MED ORDER — MAGNESIUM 400 MG PO TABS: 800.0000 mg | ORAL_TABLET | Freq: Every day | ORAL | 0 refills | Status: DC

## 2023-02-28 NOTE — Addendum Note (Signed)
Addended by: Darnelle Spangle B on: 02/28/2023 09:34 AM   Modules accepted: Orders

## 2023-03-02 ENCOUNTER — Other Ambulatory Visit: Payer: Self-pay

## 2023-03-02 ENCOUNTER — Encounter (HOSPITAL_COMMUNITY): Payer: Self-pay | Admitting: *Deleted

## 2023-03-02 ENCOUNTER — Emergency Department (HOSPITAL_COMMUNITY): Payer: 59

## 2023-03-02 ENCOUNTER — Emergency Department (HOSPITAL_COMMUNITY)
Admission: EM | Admit: 2023-03-02 | Discharge: 2023-03-02 | Discharge status: Against Medical Advice | Payer: 59 | Attending: Emergency Medicine | Admitting: Emergency Medicine

## 2023-03-02 DIAGNOSIS — R000 Tachycardia, unspecified: Secondary | ICD-10-CM | POA: Insufficient documentation

## 2023-03-02 DIAGNOSIS — Z5321 Procedure and treatment not carried out due to patient leaving prior to being seen by health care provider: Secondary | ICD-10-CM | POA: Diagnosis not present

## 2023-03-02 LAB — COMPREHENSIVE METABOLIC PANEL
ALT: 12 U/L (ref 0–44)
AST: 18 U/L (ref 15–41)
Albumin: 3.8 g/dL (ref 3.5–5.0)
Alkaline Phosphatase: 37 U/L — ABNORMAL LOW (ref 38–126)
Anion gap: 11 (ref 5–15)
BUN: 10 mg/dL (ref 8–23)
CO2: 26 mmol/L (ref 22–32)
Calcium: 9.7 mg/dL (ref 8.9–10.3)
Chloride: 102 mmol/L (ref 98–111)
Creatinine, Ser: 0.96 mg/dL (ref 0.44–1.00)
GFR, Estimated: >60 mL/min (ref >60)
Glucose, Bld: 107 mg/dL — ABNORMAL HIGH (ref 70–99)
Potassium: 3.8 mmol/L (ref 3.5–5.1)
Sodium: 139 mmol/L (ref 135–145)
Total Bilirubin: 0.7 mg/dL (ref 0.0–1.2)
Total Protein: 7.2 g/dL (ref 6.5–8.1)

## 2023-03-02 LAB — CBC WITH DIFFERENTIAL/PLATELET
Abs Immature Granulocytes: 0 10*3/uL (ref 0.00–0.07)
Basophils Absolute: 0 10*3/uL (ref 0.0–0.1)
Basophils Relative: 0 %
Eosinophils Absolute: 0.2 10*3/uL (ref 0.0–0.5)
Eosinophils Relative: 2 %
HCT: 40.2 % (ref 36.0–46.0)
Hemoglobin: 12.7 g/dL (ref 12.0–15.0)
Lymphocytes Relative: 40 %
Lymphs Abs: 3.4 10*3/uL (ref 0.7–4.0)
MCH: 24.8 pg — ABNORMAL LOW (ref 26.0–34.0)
MCHC: 31.6 g/dL (ref 30.0–36.0)
MCV: 78.4 fL — ABNORMAL LOW (ref 80.0–100.0)
Monocytes Absolute: 0.4 10*3/uL (ref 0.1–1.0)
Monocytes Relative: 5 %
Neutro Abs: 4.6 10*3/uL (ref 1.7–7.7)
Neutrophils Relative %: 53 %
Platelets: 110 10*3/uL — ABNORMAL LOW (ref 150–400)
RBC: 5.13 MIL/uL — ABNORMAL HIGH (ref 3.87–5.11)
RDW: 14.7 % (ref 11.5–15.5)
WBC: 8.6 10*3/uL (ref 4.0–10.5)
nRBC: 0 % (ref 0.0–0.2)
nRBC: 0 /100{WBCs}

## 2023-03-02 LAB — TROPONIN I (HIGH SENSITIVITY): Troponin I (High Sensitivity): 3 ng/L (ref <18)

## 2023-03-02 LAB — MAGNESIUM: Magnesium: 1.6 mg/dL — ABNORMAL LOW (ref 1.7–2.4)

## 2023-03-02 NOTE — ED Triage Notes (Signed)
The pt reports that she has had a rapid heart rate for 2 days she just has had lab work on Thursday or Friday  no pain

## 2023-03-02 NOTE — ED Provider Triage Note (Signed)
Emergency Medicine Provider Triage Evaluation Note  [name] , a 63 y.o. female  was evaluated in triage.  Pt complains of tachycardia. Says she notices when her BP is low, her HR is elevated. Had labs done 02/27/2023, noted for mild hypercalcemia and mild hypomagnesemia. Not on any blood thinners. Hx of thrombocytopenia. Last bowel movement today.   Denies fever, chest pain. Shortness of breath, fatigue, abdominal pain, n/v/d, dysuria, hematuria, hematochezia, melena, LE swelling, LE pain.   Review of Systems  Positive: See above Negative: See above  Physical Exam  BP 123/64 (BP Location: Left Arm)   Pulse (!) 104   Temp 98.9 F (37.2 C)   Resp 16   Ht 5\' 2"  (1.575 m)   Wt 73 kg   SpO2 100%   BMI 29.44 kg/m  Gen:   Awake, no distress   Resp:  Normal effort  MSK:   Moves extremities without difficulty  Other:    Medical Decision Making  Medically screening exam initiated at 3:39 PM.  Appropriate orders placed.  [name] was informed that the remainder of the evaluation will be completed by another provider, this initial triage assessment does not replace that evaluation, and the importance of remaining in the ED until their evaluation is complete.     Lunette Stands, [place] 03/02/23 1545

## 2023-03-02 NOTE — ED Notes (Signed)
Pt. Did not answer when called for 2nd trop draw.

## 2023-03-02 NOTE — ED Notes (Addendum)
Pt called for vitals x3, no response.

## 2023-03-03 ENCOUNTER — Encounter (HOSPITAL_COMMUNITY): Payer: Self-pay | Admitting: Emergency Medicine

## 2023-03-03 ENCOUNTER — Other Ambulatory Visit: Payer: Self-pay

## 2023-03-03 ENCOUNTER — Emergency Department (HOSPITAL_COMMUNITY)
Admission: EM | Admit: 2023-03-03 | Discharge: 2023-03-03 | Disposition: A | Discharge status: Home / Self Care | Payer: 59 | Attending: Emergency Medicine | Admitting: Emergency Medicine

## 2023-03-03 DIAGNOSIS — E119 Type 2 diabetes mellitus without complications: Secondary | ICD-10-CM | POA: Diagnosis not present

## 2023-03-03 DIAGNOSIS — Z7984 Long term (current) use of oral hypoglycemic drugs: Secondary | ICD-10-CM | POA: Diagnosis not present

## 2023-03-03 DIAGNOSIS — Z79899 Other long term (current) drug therapy: Secondary | ICD-10-CM | POA: Diagnosis not present

## 2023-03-03 DIAGNOSIS — I10 Essential (primary) hypertension: Secondary | ICD-10-CM | POA: Diagnosis not present

## 2023-03-03 DIAGNOSIS — R002 Palpitations: Secondary | ICD-10-CM | POA: Insufficient documentation

## 2023-03-03 LAB — CBC
HCT: 41 % (ref 36.0–46.0)
Hemoglobin: 12.9 g/dL (ref 12.0–15.0)
MCH: 24.8 pg — ABNORMAL LOW (ref 26.0–34.0)
MCHC: 31.5 g/dL (ref 30.0–36.0)
MCV: 78.7 fL — ABNORMAL LOW (ref 80.0–100.0)
Platelets: 107 10*3/uL — ABNORMAL LOW (ref 150–400)
RBC: 5.21 MIL/uL — ABNORMAL HIGH (ref 3.87–5.11)
RDW: 14.6 % (ref 11.5–15.5)
WBC: 7.4 10*3/uL (ref 4.0–10.5)
nRBC: 0 % (ref 0.0–0.2)

## 2023-03-03 LAB — MAGNESIUM: Magnesium: 1.6 mg/dL — ABNORMAL LOW (ref 1.7–2.4)

## 2023-03-03 LAB — BASIC METABOLIC PANEL
Anion gap: 11 (ref 5–15)
BUN: 9 mg/dL (ref 8–23)
CO2: 27 mmol/L (ref 22–32)
Calcium: 9.7 mg/dL (ref 8.9–10.3)
Chloride: 97 mmol/L — ABNORMAL LOW (ref 98–111)
Creatinine, Ser: 0.85 mg/dL (ref 0.44–1.00)
GFR, Estimated: >60 mL/min (ref >60)
Glucose, Bld: 124 mg/dL — ABNORMAL HIGH (ref 70–99)
Potassium: 3.6 mmol/L (ref 3.5–5.1)
Sodium: 135 mmol/L (ref 135–145)

## 2023-03-03 LAB — TROPONIN I (HIGH SENSITIVITY)
Troponin I (High Sensitivity): 3 ng/L (ref <18)
Troponin I (High Sensitivity): 4 ng/L (ref <18)

## 2023-03-03 LAB — CBG MONITORING, ED: Glucose-Capillary: 93 mg/dL (ref 70–99)

## 2023-03-03 NOTE — ED Provider Notes (Signed)
[place] EMERGENCY DEPARTMENT AT [facility] Provider Note   CSN: [id number] Arrival date & time: 03/03/23  4098     History  Chief Complaint  Patient presents with   Palpitations    [name] is a 63 y.o. female.  HPI 63 year old female history of hypertension, hyperlipidemia, diabetes, recent 100 pound weight loss after being on Ozempic which was Discussed continued 2 weeks ago presents today with reports that her blood pressure has been lower and heart rate higher over the past several measurements.  She reports that her blood pressure on the systolic has been down to 100-102/60.  This is unusual for her.  She has noticed her heart rate increased to 10 5-1 10 when she takes these.  She was seen and evaluated by her primary care doctor last week and told that her magnesium and calcium were low and started on magnesium.  She presents today here due to these ongoing episodes of noticing that her blood pressure is low and heart rate high.  She has not had any symptoms.  She has not noted any palpitations, chest pain, dyspnea, lightheadedness, change in appetite.     Home Medications Prior to Admission medications   Medication Sig Start Date End Date Taking? Authorizing Provider  Blood Glucose Monitoring Suppl (ONETOUCH VERIO REFLECT) w/Device KIT USE TO CHECK BLOOD SUGAR ONCE PER DAY 12/04/21   Jerre Simon, MD  carvedilol (COREG) 12.5 MG tablet TAKE 1 TABLET BY MOUTH TWICE DAILY WITH A MEAL 02/24/23   Jerre Simon, MD  ezetimibe (ZETIA) 10 MG tablet Take 1 tablet (10 mg total) by mouth daily. 10/24/22   Jerre Simon, MD  fluticasone [facility] ALLERGY RELIEF) 50 MCG/ACT nasal spray Place 2 sprays into both nostrils daily. 02/06/23   Tiffany Kocher, DO  glucose blood (ONETOUCH VERIO) test strip Use as instructed Patient taking differently: 1 each by Other route as needed for other (As needed). Use as instructed. Last reading: 12-30  AM (Fasting): 120 12/31/21   Jerre Simon, MD  hydrochlorothiazide (HYDRODIURIL) 25 MG tablet Take 1 tablet by mouth once daily 01/13/23   Jerre Simon, MD  Lancet Devices (ONE TOUCH DELICA LANCING DEV) MISC Please use to check blood sugar once per day. E11.9 05/29/20   Celedonio Savage, MD  Lancets (ONETOUCH DELICA PLUS LANCET33G) MISC USE  1 TO CHECK GLUCOSE ONCE DAILY 06/05/21   Cresenzo, Cyndi Lennert, MD  lidocaine (LIDODERM) 5 % Place 1 patch onto the skin daily. Remove & Discard patch within 12 hours or as directed by MD 06/03/22   Elberta Fortis, MD  lisinopril (ZESTRIL) 40 MG tablet Take 1 tablet by mouth once daily 02/10/23   Shelby Mattocks, DO  Magnesium 400 MG TABS Take 800 mg by mouth daily. 02/28/23   Alicia Amel, MD  metFORMIN (GLUCOPHAGE-XR) 500 MG 24 hr tablet TAKE 2 TABLETS BY MOUTH TWICE DAILY WITH AMEAL. 12/09/22   Jerre Simon, MD  polyethylene glycol powder [facility]) 17 GM/SCOOP powder Take 17 g by mouth 2 (two) times daily as needed for moderate constipation. 12/04/21   Jerre Simon, MD  traMADol (ULTRAM) 50 MG tablet TAKE 1 TABLET BY MOUTH EVERY 6 HOURS AS NEEDED 12/02/22   Jerre Simon, MD      Allergies    Patient has no known allergies.    Review of Systems   Review of Systems  Physical Exam Updated Vital Signs BP 138/66 (BP Location: Left Arm)   Pulse 92   Temp  98 F (36.7 C) (Oral)   Resp 16   Wt 73 kg   SpO2 98%   BMI 29.45 kg/m  Physical Exam Vitals reviewed.  HENT:     Head: Normocephalic.     Right Ear: External ear normal.     Left Ear: External ear normal.     Nose: Nose normal.     Mouth/Throat:     Pharynx: Oropharynx is clear.  Eyes:     Extraocular Movements: Extraocular movements intact.     Pupils: Pupils are equal, round, and reactive to light.  Cardiovascular:     Rate and Rhythm: Normal rate.     Pulses: Normal pulses.  Pulmonary:     Effort: Pulmonary effort is normal.     Breath sounds: Normal breath sounds.  Abdominal:     Palpations: Abdomen is soft.   Musculoskeletal:        General: Normal range of motion.     Cervical back: Normal range of motion.  Skin:    General: Skin is warm and dry.     Capillary Refill: Capillary refill takes less than 2 seconds.  Neurological:     General: No focal deficit present.     Mental Status: She is alert.     Cranial Nerves: No cranial nerve deficit.  Psychiatric:        Mood and Affect: Mood normal.        Behavior: Behavior normal.     ED Results / Procedures / Treatments   Labs (all labs ordered are listed, but only abnormal results are displayed) Labs Reviewed  BASIC METABOLIC PANEL - Abnormal; Notable for the following components:      Result Value   Chloride 97 (*)    Glucose, Bld 124 (*)    All other components within normal limits  CBC - Abnormal; Notable for the following components:   RBC 5.21 (*)    MCV 78.7 (*)    MCH 24.8 (*)    Platelets 107 (*)    All other components within normal limits  MAGNESIUM - Abnormal; Notable for the following components:   Magnesium 1.6 (*)    All other components within normal limits  CBG MONITORING, ED  TROPONIN I (HIGH SENSITIVITY)  TROPONIN I (HIGH SENSITIVITY)    EKG None ED ECG REPORT   Date: 03/03/2023  Rate: 82  Rhythm: normal sinus rhythm  QRS Axis: normal  Intervals: normal  ST/T Wave abnormalities: normal  Conduction Disutrbances:none  Narrative Interpretation:   Old EKG Reviewed: unchanged  I have personally reviewed the EKG tracing and agree with the computerized printout as noted.  Radiology DG Chest 2 View Result Date: 03/02/2023 CLINICAL DATA:  Acute coronary syndrome workup. Rapid heart rate for 2 days. EXAM: CHEST - 2 VIEW COMPARISON:  Chest radiographs 01/28/2018, 06/29/2014, 03/19/2014 FINDINGS: Cardiac silhouette and mediastinal contours are within limits. There is subtle homogeneous increased density seen overlying the inferior right lung, similar to 01/28/2018 prior radiographs. Note is made on CT  09/21/2010 of postsurgical changes of right mastectomy and breast implant. There are right axillary surgical clips. The right lower lung density is favored to be secondary to the breasts procedural changes. No lower lung airspace opacities seen on lateral view. A round EKG lead overlies the right upper hemithorax. No pleural effusion or pneumothorax. Moderate multilevel degenerative disc changes of the thoracic spine. IMPRESSION: No acute cardiopulmonary process. Electronically Signed   By: Neita Garnet M.D.   On: 03/02/2023 17:02  Procedures Procedures    Medications Ordered in ED Medications - No data to display  ED Course/ Medical Decision Making/ A&P                                 Medical Decision Making Amount and/or Complexity of Data Reviewed Labs: ordered.   63 year old female presents today complaining of increased heart rate when she checks her blood pressure.  She has also noticed her blood pressure to be down somewhat.  Significant recent past medical history as she has had a 100 pound weight loss on Ozempic.  This has been discontinued.  She remains on the same blood pressure medicines and dosages that she was on previously these include carvedilol, lisinopril, and hydrochlorothiazide.  She has been started on magnesium repletion and has started taking this over the past several days. Differential diagnosis includes but is not limited to tachyarrhythmia, PE, hyperthyroidism, other electrolyte abnormalities and anemia.  Patient evaluated here in the ED with CBC with normal hemoglobin and hematocrit and white blood cell count.  Platelets are 107,000 with mild thrombocytopenia which is stable from the past 3 months. Museum is slightly low at 1.6 she is on oral repletion Creatinine normal and calcium normal today EKG with normal sinus rhythm with no evidence of acute ischemia heart rate is 82 on EKG Blood pressure is normal here at 138/66 Based on history doubt acute coronary  syndrome with patient having no chest pain, normal EKG, does not appear to be secondary to acute electrolyte abnormality or anemia, patient does not appear to be significantly hypo or hyperthyroid on her physical exam today. Patient advised to follow-up with her primary care physician.  She may need some adjustment of her antihypertensives based on her significant recent weight loss. We have discussed return precautions and need for follow-up and patient voices understanding.          Final Clinical Impression(s) / ED Diagnoses Final diagnoses:  Palpitations    Rx / DC Orders ED Discharge Orders     None         Margarita Grizzle, MD 03/03/23 1725

## 2023-03-03 NOTE — ED Provider Triage Note (Signed)
Emergency Medicine Provider Triage Evaluation Note  [name] , a 63 y.o. female  was evaluated in triage.  Pt complains of  tachycardia. Notices when her BP is low, her HR is elevated. Had labs done 02/27/2023, noted for mild hypercalcemia and mild hypomagnesemia. Possible abnormal thyroid level reported as well   Review of Systems  Positive: Intermittent palpitations Negative: Shortness of breath, dizziness, lightheadedness generalized weakness abdominal pain nausea vomiting diarrhea leg swelling  Physical Exam  BP (!) 155/73 (BP Location: Left Arm)   Pulse 85   Temp 97.9 F (36.6 C)   Resp 20   Wt 73 kg   SpO2 100%   BMI 29.45 kg/m  Gen:   Awake, no distress   Resp:  Normal effort  MSK:   Moves extremities without difficulty  Other:    Medical Decision Making  Medically screening exam initiated at 10:38 AM.  Appropriate orders placed.  HEDI BARKAN was informed that the remainder of the evaluation will be completed by another provider, this initial triage assessment does not replace that evaluation, and the importance of remaining in the ED until their evaluation is complete.  Patient is following with primary care for this issue and is getting mailed ZIO monitor.  Does have past medical history of hypertension, diabetes, thyroid disease, hyperlipidemia, breast cancer and thrombocytopenia.   Smitty Knudsen, PA-C 03/03/23 1039

## 2023-03-03 NOTE — Discharge Instructions (Signed)
Your evaluation here today is normal with the exception of your knees and being slightly low at 1.6.  Please continue your magnesium repletion. Please follow-up with your doctor.  It seems that some of your medications may need to be adjusted in light of your recent weight loss. Return to the emergency department if you are having new or worsening symptoms especially chest pain, weakness, lightheadedness, or shortness of breath

## 2023-03-03 NOTE — ED Triage Notes (Signed)
Pt states that she is presenting for the same sx that brought her to ER yesterday (palpitations x2 days).She left prior to completing care yesterday citing long wait times. Her PCP has sent orders for zio monitor but she has not picked up.   Denies dizziness, SOB, fatigue

## 2023-03-04 LAB — PTH, INTACT AND CALCIUM: PTH: 43 pg/mL (ref 15–65)

## 2023-03-05 ENCOUNTER — Ambulatory Visit (INDEPENDENT_AMBULATORY_CARE_PROVIDER_SITE_OTHER): Payer: 59 | Admitting: Family Medicine

## 2023-03-05 VITALS — BP 126/75 | HR 80 | Temp 98.0°F | Wt 159.0 lb

## 2023-03-05 DIAGNOSIS — R002 Palpitations: Secondary | ICD-10-CM | POA: Diagnosis not present

## 2023-03-05 NOTE — Progress Notes (Signed)
    SUBJECTIVE:   CHIEF COMPLAINT / HPI:   Needing help with applying Zio patch Seen on 02/27/2023 for palpitations for multiple weeks without chest pain or shortness of breath.  Heart rate at the time that she feels the palpitations were at the highest in the low 100s.  She takes Coreg for hypertension.  The palpitations that cause anxiety.  She has her Zio patch today, though she would like some assistance with getting it set up.  PERTINENT  PMH / PSH: Mitral regurgitation, hypertension, type 2 diabetes, vitamin D deficiency, elevated BMI, HLD  OBJECTIVE:   BP 126/75   Pulse 80   Temp 98 F (36.7 C)   Wt 159 lb (72.1 kg)   SpO2 99%   BMI 29.08 kg/m   General: Alert and oriented, in NAD Skin: Warm, dry, and intact without lesions HEENT: NCAT, EOM grossly normal, midline nasal septum Respiratory: Breathing and speaking comfortably on RA Extremities: Moves all extremities grossly equally Neurological: No gross focal deficit Psychiatric: Appropriate mood and affect   ASSESSMENT/PLAN:   Palpitations Zio patch successfully placed, turned on, and paired with phone.  Discussed pressing the patch and recording the associated symptoms if they occur over the 14-day period.  Advised of how to remove the patch and how to send back in.  She will let me know if she has any problems over the 2 weeks, and she is aware of the troubleshooting section in the included manual.   Health maintenance Return for health maintenance items with PCP.  Janeal Holmes, MD [facility]

## 2023-03-05 NOTE — Assessment & Plan Note (Addendum)
Zio patch successfully placed, turned on, and paired with phone.  Discussed pressing the patch and recording the associated symptoms if they occur over the 14-day period.  Advised of how to remove the patch and how to send back in.  She will let me know if she has any problems over the 2 weeks, and she is aware of the troubleshooting section in the included manual.

## 2023-03-06 ENCOUNTER — Other Ambulatory Visit: Payer: Self-pay | Admitting: Student

## 2023-03-06 DIAGNOSIS — E785 Hyperlipidemia, unspecified: Secondary | ICD-10-CM

## 2023-03-07 ENCOUNTER — Encounter: Payer: Self-pay | Admitting: Student

## 2023-03-07 ENCOUNTER — Ambulatory Visit (INDEPENDENT_AMBULATORY_CARE_PROVIDER_SITE_OTHER): Payer: 59 | Admitting: Student

## 2023-03-07 VITALS — BP 130/66 | HR 81 | Ht 62.0 in | Wt 161.4 lb

## 2023-03-07 DIAGNOSIS — R002 Palpitations: Secondary | ICD-10-CM

## 2023-03-07 NOTE — Patient Instructions (Addendum)
Pleasure to see you today.  Your blood pressure and heart rate today were normal.  Your last labs including thyroid and also your checks x-ray were all normal.  Given that your Zio patch on and follow-up for that would be February 12, I recommend following up with me after that to consider doing an ambulatory BP reading for 24 hours.  Also I strongly recommend keeping a daily diary of every time your heart rate goes up with your blood pressure drops.  If he noticed any shortness of breath associated with it.  Please make sure to keep a diary of all days.  I have also the contact information for your cardiologist below.  Please call them to schedule an appointment to be seen as soon as possible. Cardiologist: Dr. Rollene Rotunda [address] [place], [place] [address] [phone]

## 2023-03-07 NOTE — Assessment & Plan Note (Addendum)
Last for BP and pulse were within normal range.  Unclear cause of patient's intermittent reported hypotension and tachycardia. General workup including EKG, electrolytes, TSH and chest x-ray are all normal.  Currently undergoing cardiac workup and if unremarkable could consider other possible symptoms including anxiety.  Reassuring so far patient has been asymptomatic without chest pain, shortness of breath or dizziness. -Continue Zio patch monitoring -Provided patient with contact to her cardiologist to follow-up with cards -Advised patient to keep a diary of each event -Follow-up again on February 14 after appointment for Zio patch -Strict return precaution discussed with patient including red flag symptoms.

## 2023-03-07 NOTE — Progress Notes (Signed)
    SUBJECTIVE:   CHIEF COMPLAINT / HPI: Hypotension and tachycardia  -63 year old female with Hx of HTN -Recently seen in the ED for concerns of Hypotension and tachycardia -Pt reported intermittent HR ranging 105-115 -ED work up including electrolyte were generally normal, EKG showed SR and vitals include normotensive BP -Current meds include lisinopril 40 mg daily, HCTZ 25 mg daily and carvedilol 12.5 mg daily. -Currently has Zio patch with follow-up appointment scheduled for February 12. -Denies any chest pain, shortness of breath or lightheadedness. -Last echo in 2020 shows EF of 65%, imaging was limited no notable abnormalities.  PERTINENT  PMH / PSH: HTN, mitral valve regurg  OBJECTIVE:   BP 130/66   Pulse 81   Ht 5\' 2"  (1.575 m)   Wt 161 lb 6.4 oz (73.2 kg)   SpO2 99%   BMI 29.52 kg/m    Physical Exam General: Alert, well appearing, NAD Cardiovascular: RRR, Normal S2/S2 Respiratory: CTAB, No wheezing or Rales Abdomen: No distension or tenderness Extremities: No edema on extremities, +2 pedal pulse bilateral Psych: Normal affect, appropriate judgement   ASSESSMENT/PLAN:   Palpitations Last for BP and pulse were within normal range.  Unclear cause of patient's intermittent reported hypotension and tachycardia. General workup including EKG, electrolytes, TSH and chest x-ray are all normal.  Currently undergoing cardiac workup and if unremarkable could consider other possible symptoms including anxiety.  Reassuring so far patient has been asymptomatic without chest pain, shortness of breath or dizziness. -Continue Zio patch monitoring -Provided patient with contact to her cardiologist to follow-up with cards -Advised patient to keep a diary of each event -Follow-up again on February 14 after appointment for Zio patch -Strict return precaution discussed with patient including red flag symptoms.     Jerre Simon, MD [facility]

## 2023-03-09 NOTE — Progress Notes (Unsigned)
Cardiology Office Note:    Date:  03/11/2023   ID:  EDLIN FORD, DOB 06-May-1960, MRN [id number]  PCP:  Jerre Simon, MD  Cardiologist:  Rollene Rotunda, MD  Electrophysiologist:  None   Referring MD: Jerre Simon, MD   Chief Complaint  Patient presents with   Palpitations    History of Present Illness:    [name] is a 63 y.o. female with a hx of breast cancer, hypertension, hyperlipidemia, T2DM who is referred by Dr. Elliot Gurney for evaluation of palpitations.  She previously followed with Dr. Antoine Poche, last seen 08/2018.  States that she has felt episodes of heart racing for last 2-week.  Lasts short duration.  Also reports about once per week has been having burning in chest.  Occurs with exertion and resolves with rest.  Typically last for about 2 to 3 minutes.  She denies any shortness of breath, lightheadedness, or syncope.  Does report some lower extremity edema.  Echocardiogram 07/2018 showed EF greater than 65%, normal RV function, no significant valvular disease but poor quality study.  ETT 04/2015 was normal stress test.  Past Medical History:  Diagnosis Date   Arthritis    Breast CA (HCC)    breast CA- right breast   Diabetes mellitus    Hyperlipidemia    Hypertension    Thrombocytopenia (HCC)    Thyroid disease    parathyroid removed    Past Surgical History:  Procedure Laterality Date   ABDOMINAL HYSTERECTOMY     1996 per pt   BREAST BIOPSY Right 06/2009   BREAST SURGERY  09/12/2010   excision of skin mass - right breast, had another surgery to remove more Cancer cells   COLONOSCOPY     INNER EAR SURGERY  06/01/2013   MASTECTOMY Right 07/21/2009   PARATHYROIDECTOMY     partial?   REDUCTION MAMMAPLASTY Left 2013    Current Medications: Current Meds  Medication Sig   amLODipine (NORVASC) 5 MG tablet Take 1 tablet (5 mg total) by mouth daily.   Blood Glucose Monitoring Suppl (ONETOUCH VERIO REFLECT) w/Device KIT USE TO CHECK BLOOD SUGAR ONCE  PER DAY   carvedilol (COREG) 12.5 MG tablet TAKE 1 TABLET BY MOUTH TWICE DAILY WITH A MEAL   ezetimibe (ZETIA) 10 MG tablet Take 1 tablet (10 mg total) by mouth daily.   glucose blood (ONETOUCH VERIO) test strip Use as instructed (Patient taking differently: 1 each by Other route as needed for other (As needed). Use as instructed. Last reading: 12-30  AM (Fasting): 120)   Lancet Devices (ONE TOUCH DELICA LANCING DEV) MISC Please use to check blood sugar once per day. E11.9 (Patient taking differently: daily as needed. Please use to check blood sugar once per day. E11.9)   Lancets (ONETOUCH DELICA PLUS LANCET33G) MISC USE  1 TO CHECK GLUCOSE ONCE DAILY (Patient taking differently: daily as needed. USE  1 TO CHECK GLUCOSE ONCE DAILY)   lidocaine (LIDODERM) 5 % Place 1 patch onto the skin daily. Remove & Discard patch within 12 hours or as directed by MD   lisinopril (ZESTRIL) 40 MG tablet Take 1 tablet by mouth once daily   Magnesium 400 MG TABS Take 800 mg by mouth daily.   metFORMIN (GLUCOPHAGE-XR) 500 MG 24 hr tablet TAKE 2 TABLETS BY MOUTH TWICE DAILY WITH AMEAL.   metoprolol tartrate (LOPRESSOR) 100 MG tablet Take 1 tablet (100 mg total) by mouth once for 1 dose.   polyethylene glycol powder (GLYCOLAX/MIRALAX) 17 GM/SCOOP powder  Take 17 g by mouth 2 (two) times daily as needed for moderate constipation.   traMADol (ULTRAM) 50 MG tablet TAKE 1 TABLET BY MOUTH EVERY 6 HOURS AS NEEDED   [DISCONTINUED] hydrochlorothiazide (HYDRODIURIL) 25 MG tablet Take 1 tablet by mouth once daily   [DISCONTINUED] metoprolol tartrate (LOPRESSOR) 100 MG tablet Take 1 tablet (100 mg total) by mouth 2 (two) times daily.     Allergies:   Patient has no known allergies.   Social History   Socioeconomic History   Marital status: Married    Spouse name: Not on file   Number of children: 2   Years of education: Not on file   Highest education level: Not on file  Occupational History   Occupation: Disability   Tobacco Use   Smoking status: Never    Passive exposure: Past   Smokeless tobacco: Never  Vaping Use   Vaping status: Never Used  Substance and Sexual Activity   Alcohol use: No   Drug use: No   Sexual activity: Not Currently    Birth control/protection: Surgical  Other Topics Concern   Not on file  Social History Narrative   Pt lives with husband and in [place].   Does not work or go to school.   Walks daily    Exercises with walking in yard.   Drinks 2 cans sweetened soda daily.   Social Drivers of Corporate investment banker Strain: Low Risk  (05/06/2022)   Overall Financial Resource Strain (CARDIA)    Difficulty of Paying Living Expenses: Not hard at all  Food Insecurity: Low Risk  (11/04/2022)   Received from [facility]   Hunger Vital Sign    Worried About Running Out of Food in the Last Year: Never true    Ran Out of Food in the Last Year: Never true  Transportation Needs: No Transportation Needs (11/04/2022)   Received from [facility]    In the past 12 months, has lack of reliable transportation kept you from medical appointments, meetings, work or from getting things needed for daily living? : No  Physical Activity: Sufficiently Active (05/06/2022)   Exercise Vital Sign    Days of Exercise per Week: 3 days    Minutes of Exercise per Session: 60 min  Stress: No Stress Concern Present (05/06/2022)   [employer] of Occupational Health - Occupational Stress Questionnaire    Feeling of Stress : Not at all  Social Connections: Moderately Isolated (05/06/2022)   Social Connection and Isolation Panel [NHANES]    Frequency of Communication with Friends and Family: More than three times a week    Frequency of Social Gatherings with Friends and Family: Twice a week    Attends Religious Services: Never    Database administrator or Organizations: No    Attends Engineer, structural: Never    Marital Status: Married     Family History: The  patient's family history includes CAD (age of onset: 17) in her mother; Colon polyps in her sister; Heart attack (age of onset: 68) in her brother; Stroke in her father. There is no history of Colon cancer, Esophageal cancer, Rectal cancer, or Stomach cancer.  ROS:   Please see the history of present illness.     All other systems reviewed and are negative.  EKGs/Labs/Other Studies Reviewed:    The following studies were reviewed today:   EKG:   03/10/23: Normal sinus rhythm, rate 95, no ST abnormalities  Recent  Labs: 02/27/2023: TSH 1.530 03/02/2023: ALT 12 03/03/2023: BUN 9; Creatinine, Ser 0.85; Hemoglobin 12.9; Magnesium 1.6; Platelets 107; Potassium 3.6; Sodium 135  Recent Lipid Panel    Component Value Date/Time   CHOL 195 10/03/2021 0927   TRIG 146 10/03/2021 0927   HDL 67 10/03/2021 0927   CHOLHDL 2.9 10/03/2021 0927   CHOLHDL 4.0 02/11/2014 0915   VLDL 67 (H) 02/11/2014 0915   LDLCALC 103 (H) 10/03/2021 0927   LDLDIRECT 124 (H) 04/24/2017 1411    Physical Exam:    VS:  BP 132/88 (BP Location: Left Arm, Patient Position: Sitting)   Pulse 95   Ht 5\' 3"  (1.6 m)   Wt 161 lb 9.6 oz (73.3 kg)   SpO2 100%   BMI 28.63 kg/m     Wt Readings from Last 3 Encounters:  03/10/23 161 lb 9.6 oz (73.3 kg)  03/07/23 161 lb 6.4 oz (73.2 kg)  03/05/23 159 lb (72.1 kg)     GEN:  Well nourished, well developed in no acute distress HEENT: Normal NECK: No JVD; No carotid bruits LYMPHATICS: No lymphadenopathy CARDIAC: RRR, 2/6 systolic murmur RESPIRATORY:  Clear to auscultation without rales, wheezing or rhonchi  ABDOMEN: Soft, non-tender, non-distended MUSCULOSKELETAL:  No edema; No deformity  SKIN: Warm and dry NEUROLOGIC:  Alert and oriented x 3 PSYCHIATRIC:  Normal affect   ASSESSMENT:    1. Palpitations   2. Chest pain, unspecified type   3. Heart murmur   4. Hyperlipidemia, unspecified hyperlipidemia type   5. Hypertension, unspecified type    PLAN:    Chest pain:  Atypical in description but does report occurs with exertion, suggesting possible angina.  Recommend coronary CTA to evaluate for obstructive CAD.  Will have her hold her home carvedilol and give 100 mg Lopressor prior to study.  Palpitations: Description concerning for arrhythmia.  She is currently wearing ZIO monitor, will follow-up results  Heart murmur: 2/6 systolic murmur, will check echocardiogram  Hypertension: On carvedilol 12.5 mg twice daily, HCTZ 25 mg daily, lisinopril 40 mg daily. -Low potassium/magnesium on recent labs, could be contributing to palpitations.  Recommend stopping HCTZ.  Will add amlodipine 5 mg daily.  Asked to check BP daily for next 2 weeks and let us know results.  Check BMET/magnesium in 1 week   Hyperlipidemia: On Zetia 10 mg daily.  Check lipid panel   T2DM: On metformin.  A1c 6.6% 10/24/2022  RTC in 4 months   Medication Adjustments/Labs and Tests Ordered: Current medicines are reviewed at length with the patient today.  Concerns regarding medicines are outlined above.  Orders Placed This Encounter  Procedures   CT CORONARY MORPH W/CTA COR W/SCORE W/CA W/CM &/OR WO/CM   Basic Metabolic Panel (BMET)   Magnesium   Lipid panel   EKG 12-Lead   ECHOCARDIOGRAM COMPLETE   Meds ordered this encounter  Medications   amLODipine (NORVASC) 5 MG tablet    Sig: Take 1 tablet (5 mg total) by mouth daily.    Dispense:  90 tablet    Refill:  3   DISCONTD: metoprolol tartrate (LOPRESSOR) 100 MG tablet    Sig: Take 1 tablet (100 mg total) by mouth 2 (two) times daily.    Dispense:  1 tablet    Refill:  0   metoprolol tartrate (LOPRESSOR) 100 MG tablet    Sig: Take 1 tablet (100 mg total) by mouth once for 1 dose.    Dispense:  1 tablet    Refill:  0  Patient Instructions  Medication Instructions:  Stop Hydrochlorothiazide as directed by your provider Hold Coreg the day of procedure take lopressor 100 mg 2 hrs before your procedure Continue all current  medication *If you need a refill on your cardiac medications before your next appointment, please call your pharmacy*   Lab Work: Bmet, mg lipid in one week If you have labs (blood work) drawn today and your tests are completely normal, you will receive your results only by: MyChart Message (if you have MyChart) OR A paper copy in the mail If you have any lab test that is abnormal or we need to change your treatment, we will call you to review the results.   Testing/Procedures: Echo Your physician has requested that you have an echocardiogram. Echocardiography is a painless test that uses sound waves to create images of your heart. It provides your doctor with information about the size and shape of your heart and how well your heart's chambers and valves are working. This procedure takes approximately one hour. There are no restrictions for this procedure. Please do NOT wear cologne, perfume, aftershave, or lotions (deodorant is allowed). Please arrive 15 minutes prior to your appointment time.  Please note: We ask at that you not bring children with you during ultrasound (echo/ vascular) testing. Due to room size and safety concerns, children are not allowed in the ultrasound rooms during exams. Our front office staff cannot provide observation of children in our lobby area while testing is being conducted. An adult accompanying a patient to their appointment will only be allowed in the ultrasound room at the discretion of the ultrasound technician under special circumstances. We apologize for any inconvenience.    Follow-Up: At [facility], you and your health needs are our priority.  As part of our continuing mission to provide you with exceptional heart care, we have created designated Provider Care Teams.  These Care Teams include your primary Cardiologist (physician) and Advanced Practice Providers (APPs -  Physician Assistants and Nurse Practitioners) who all work together to  provide you with the care you need, when you need it.  We recommend signing up for the patient portal called "MyChart".  Sign up information is provided on this After Visit Summary.  MyChart is used to connect with patients for Virtual Visits (Telemedicine).  Patients are able to view lab/test results, encounter notes, upcoming appointments, etc.  Non-urgent messages can be sent to your provider as well.   To learn more about what you can do with MyChart, go to [email or web address].    Your next appointment:   4 month(s)  Provider:   Dr. Bjorn Pippin Other Instructions   Your cardiac CT will be scheduled at one of the below locations:   [facility] [address] [place], [place] [address] [id number]   If scheduled at [facility], please arrive at the [facility] and Children's Entrance (Entrance C2) of [facility] 30 minutes prior to test start time. You can use the FREE valet parking offered at entrance C (encouraged to control the heart rate for the test)  Proceed to the [facility] Radiology Department (first floor) to check-in and test prep.  All radiology patients and guests should use entrance C2 at [facility], accessed from [facility], even though the hospital's physical address listed is [address]    Please follow these instructions carefully (unless otherwise directed):  An IV will be required for this test and Nitroglycerin will  be given.    On the Night Before the Test: Be sure to Drink plenty of water. Do not consume any caffeinated/decaffeinated beverages or chocolate 12 hours prior to your test. Do not take any antihistamines 12 hours prior to your test.  Do not eat any food 1 hour prior to test. You may take your regular medications prior to the test.  Take metoprolol (Lopressor) 100mg   two hours prior to test. If you take Furosemide/Hydrochlorothiazide/Spironolactone/Chlorthalidone, please HOLD on  the morning of the test. Patients who wear a continuous glucose monitor MUST remove the device prior to scanning. FEMALES- please wear underwire-free bra if available, avoid dresses & tight clothing  After the Test: Drink plenty of water. After receiving IV contrast, you may experience a mild flushed feeling. This is normal. On occasion, you may experience a mild rash up to 24 hours after the test. This is not dangerous. If this occurs, you can take Benadryl 25 mg, Zyrtec, Claritin, or Allegra and increase your fluid intake. (Patients taking Tikosyn should avoid Benadryl, and may take Zyrtec, Claritin, or Allegra) If you experience trouble breathing, this can be serious. If it is severe call 911 IMMEDIATELY. If it is mild, please call our office.  We will call to schedule your test 2-4 weeks out understanding that some insurance companies will need an authorization prior to the service being performed.   For more information and frequently asked questions, please visit our website : [email or web address]  For non-scheduling related questions, please contact the cardiac imaging nurse navigator should you have any questions/concerns: Cardiac Imaging Nurse Navigators Direct Office Dial: [phone]   For scheduling needs, including cancellations and rescheduling, please call [place], [phone].   Check blood pressure once a for 2 weeks and send via mychart.          Signed, Little Ishikawa, MD  03/11/2023 5:54 PM    [facility]

## 2023-03-10 ENCOUNTER — Ambulatory Visit: Payer: 59 | Attending: Cardiology | Admitting: Cardiology

## 2023-03-10 ENCOUNTER — Encounter: Payer: Self-pay | Admitting: Cardiology

## 2023-03-10 VITALS — BP 132/88 | HR 95 | Ht 63.0 in | Wt 161.6 lb

## 2023-03-10 DIAGNOSIS — R002 Palpitations: Secondary | ICD-10-CM

## 2023-03-10 DIAGNOSIS — R011 Cardiac murmur, unspecified: Secondary | ICD-10-CM

## 2023-03-10 DIAGNOSIS — R079 Chest pain, unspecified: Secondary | ICD-10-CM | POA: Diagnosis not present

## 2023-03-10 DIAGNOSIS — I10 Essential (primary) hypertension: Secondary | ICD-10-CM

## 2023-03-10 DIAGNOSIS — E785 Hyperlipidemia, unspecified: Secondary | ICD-10-CM

## 2023-03-10 MED ORDER — METOPROLOL TARTRATE 100 MG PO TABS: 100.0000 mg | ORAL_TABLET | Freq: Two times a day (BID) | ORAL | 0 refills | Status: DC

## 2023-03-10 MED ORDER — METOPROLOL TARTRATE 100 MG PO TABS: 100.0000 mg | ORAL_TABLET | Freq: Once | ORAL | 0 refills | Status: DC

## 2023-03-10 MED ORDER — AMLODIPINE BESYLATE 5 MG PO TABS: 5.0000 mg | ORAL_TABLET | Freq: Every day | ORAL | 3 refills | Status: DC

## 2023-03-10 NOTE — Patient Instructions (Addendum)
Medication Instructions:  Stop Hydrochlorothiazide as directed by your provider Hold Coreg the day of procedure take lopressor 100 mg 2 hrs before your procedure Continue all current medication *If you need a refill on your cardiac medications before your next appointment, please call your pharmacy*   Lab Work: Bmet, mg lipid in one week If you have labs (blood work) drawn today and your tests are completely normal, you will receive your results only by: MyChart Message (if you have MyChart) OR A paper copy in the mail If you have any lab test that is abnormal or we need to change your treatment, we will call you to review the results.   Testing/Procedures: Echo Your physician has requested that you have an echocardiogram. Echocardiography is a painless test that uses sound waves to create images of your heart. It provides your doctor with information about the size and shape of your heart and how well your heart's chambers and valves are working. This procedure takes approximately one hour. There are no restrictions for this procedure. Please do NOT wear cologne, perfume, aftershave, or lotions (deodorant is allowed). Please arrive 15 minutes prior to your appointment time.  Please note: We ask at that you not bring children with you during ultrasound (echo/ vascular) testing. Due to room size and safety concerns, children are not allowed in the ultrasound rooms during exams. Our front office staff cannot provide observation of children in our lobby area while testing is being conducted. An adult accompanying a patient to their appointment will only be allowed in the ultrasound room at the discretion of the ultrasound technician under special circumstances. We apologize for any inconvenience.    Follow-Up: At [facility], you and your health needs are our priority.  As part of our continuing mission to provide you with exceptional heart care, we have created designated Provider  Care Teams.  These Care Teams include your primary Cardiologist (physician) and Advanced Practice Providers (APPs -  Physician Assistants and Nurse Practitioners) who all work together to provide you with the care you need, when you need it.  We recommend signing up for the patient portal called "MyChart".  Sign up information is provided on this After Visit Summary.  MyChart is used to connect with patients for Virtual Visits (Telemedicine).  Patients are able to view lab/test results, encounter notes, upcoming appointments, etc.  Non-urgent messages can be sent to your provider as well.   To learn more about what you can do with MyChart, go to [email or web address].    Your next appointment:   4 month(s)  Provider:   Dr. Bjorn Pippin Other Instructions   Your cardiac CT will be scheduled at one of the below locations:   [facility] [address] [place], [place] [address] [phone]   If scheduled at [facility], please arrive at the [facility] and Children's Entrance (Entrance C2) of [facility] 30 minutes prior to test start time. You can use the FREE valet parking offered at entrance C (encouraged to control the heart rate for the test)  Proceed to the [facility] Radiology Department (first floor) to check-in and test prep.  All radiology patients and guests should use entrance C2 at [facility], accessed from [facility], even though the hospital's physical address listed is [address]    Please follow these instructions carefully (unless otherwise directed):  An IV will be required for this test and Nitroglycerin will be given.  On the Night Before the Test: Be sure to Drink plenty of water. Do not consume any caffeinated/decaffeinated beverages or chocolate 12 hours prior to your test. Do not take any antihistamines 12 hours prior to your test.  Do not eat any food 1 hour prior to test. You may take your  regular medications prior to the test.  Take metoprolol (Lopressor) 100mg   two hours prior to test. If you take Furosemide/Hydrochlorothiazide/Spironolactone/Chlorthalidone, please HOLD on the morning of the test. Patients who wear a continuous glucose monitor MUST remove the device prior to scanning. FEMALES- please wear underwire-free bra if available, avoid dresses & tight clothing  After the Test: Drink plenty of water. After receiving IV contrast, you may experience a mild flushed feeling. This is normal. On occasion, you may experience a mild rash up to 24 hours after the test. This is not dangerous. If this occurs, you can take Benadryl 25 mg, Zyrtec, Claritin, or Allegra and increase your fluid intake. (Patients taking Tikosyn should avoid Benadryl, and may take Zyrtec, Claritin, or Allegra) If you experience trouble breathing, this can be serious. If it is severe call 911 IMMEDIATELY. If it is mild, please call our office.  We will call to schedule your test 2-4 weeks out understanding that some insurance companies will need an authorization prior to the service being performed.   For more information and frequently asked questions, please visit our website : [email or web address]  For non-scheduling related questions, please contact the cardiac imaging nurse navigator should you have any questions/concerns: Cardiac Imaging Nurse Navigators Direct Office Dial: [phone]   For scheduling needs, including cancellations and rescheduling, please call [place], [phone].   Check blood pressure once a for 2 weeks and send via mychart.

## 2023-03-11 ENCOUNTER — Telehealth: Payer: Self-pay | Admitting: Cardiology

## 2023-03-11 NOTE — Telephone Encounter (Signed)
 Pt c/o medication issue:  1. Name of Medication: metoprolol  tartrate (LOPRESSOR ) 100 MG tablet (Expired)   2. How are you currently taking this medication (dosage and times per day)? Take 1 tablet (100 mg total) by mouth once for 1 dose.   3. Are you having a reaction (difficulty breathing--STAT)? No   4. What is your medication issue? Pharmacy called to verify the instructions as far as quantity of pills and how they should be taken

## 2023-03-11 NOTE — Telephone Encounter (Signed)
Called pharmacy and they were not open so I left a detailed VMM indicating that the metoprolol tartrate 100mg  is a one time dose 2 hours prior to a coronary CT test.

## 2023-03-12 ENCOUNTER — Other Ambulatory Visit: Payer: Self-pay | Admitting: *Deleted

## 2023-03-12 ENCOUNTER — Encounter: Payer: Self-pay | Admitting: *Deleted

## 2023-03-12 DIAGNOSIS — I10 Essential (primary) hypertension: Secondary | ICD-10-CM

## 2023-03-12 LAB — MAGNESIUM: Magnesium: 1.4 mg/dL — ABNORMAL LOW (ref 1.6–2.3)

## 2023-03-12 LAB — LIPID PANEL
Chol/HDL Ratio: 3.8 {ratio} (ref 0.0–4.4)
Cholesterol, Total: 240 mg/dL — ABNORMAL HIGH (ref 100–199)
HDL: 63 mg/dL (ref >39)
LDL Chol Calc (NIH): 148 mg/dL — ABNORMAL HIGH (ref 0–99)
Triglycerides: 161 mg/dL — ABNORMAL HIGH (ref 0–149)
VLDL Cholesterol Cal: 29 mg/dL (ref 5–40)

## 2023-03-12 LAB — BASIC METABOLIC PANEL
BUN/Creatinine Ratio: 16 (ref 12–28)
BUN: 13 mg/dL (ref 8–27)
CO2: 27 mmol/L (ref 20–29)
Calcium: 10.3 mg/dL (ref 8.7–10.3)
Chloride: 97 mmol/L (ref 96–106)
Creatinine, Ser: 0.81 mg/dL (ref 0.57–1.00)
Glucose: 96 mg/dL (ref 70–99)
Potassium: 3.9 mmol/L (ref 3.5–5.2)
Sodium: 140 mmol/L (ref 134–144)
eGFR: 82 mL/min/{1.73_m2} (ref >59)

## 2023-03-12 NOTE — Progress Notes (Signed)
 Results released and seen by patient. Orders placed as ordered by Dr. Alda Amas for Coronary CTA. Lab orders placed for BMET and mg to be done in 2 weeks. Patient left message to call office for any questions.

## 2023-03-17 ENCOUNTER — Telehealth: Payer: Self-pay | Admitting: Cardiology

## 2023-03-17 NOTE — Telephone Encounter (Signed)
 Called and results and recommendations given per Dr. Alda Amas. Orders place for Coronary CTA and labs ordered for bmet and mg. Per patient Coronary CTA has been scheduled. Patient verbalized an understanding

## 2023-03-17 NOTE — Telephone Encounter (Signed)
 Pt called in today asking to speak with nurse about her labs.

## 2023-03-21 ENCOUNTER — Ambulatory Visit: Payer: Self-pay | Admitting: Student

## 2023-03-24 ENCOUNTER — Telehealth: Payer: Self-pay | Admitting: Cardiology

## 2023-03-24 NOTE — Telephone Encounter (Signed)
 Patient rescheduled her echocardiogram and wants a call back to confirm when she should have her labs drawn.

## 2023-03-24 NOTE — Telephone Encounter (Signed)
 Patient identification verified by 2 forms. [name], RN    Called and spoke to patient  Informed patient: -labs should be completed after 2/18 -labs needed due to hydrochlorothiazide and recheck mag  Patient verbalized understanding, no questions at this time

## 2023-03-26 ENCOUNTER — Other Ambulatory Visit (HOSPITAL_COMMUNITY): Payer: 59

## 2023-03-26 ENCOUNTER — Other Ambulatory Visit: Payer: Self-pay

## 2023-03-26 DIAGNOSIS — I10 Essential (primary) hypertension: Secondary | ICD-10-CM

## 2023-03-26 LAB — BASIC METABOLIC PANEL
BUN/Creatinine Ratio: 14 (ref 12–28)
BUN: 10 mg/dL (ref 8–27)
CO2: 24 mmol/L (ref 20–29)
Calcium: 9.6 mg/dL (ref 8.7–10.3)
Chloride: 105 mmol/L (ref 96–106)
Creatinine, Ser: 0.7 mg/dL (ref 0.57–1.00)
Glucose: 91 mg/dL (ref 70–99)
Potassium: 4.8 mmol/L (ref 3.5–5.2)
Sodium: 142 mmol/L (ref 134–144)
eGFR: 97 mL/min/{1.73_m2} (ref >59)

## 2023-03-26 LAB — MAGNESIUM: Magnesium: 1.8 mg/dL (ref 1.6–2.3)

## 2023-03-27 ENCOUNTER — Ambulatory Visit (HOSPITAL_COMMUNITY): Payer: 59

## 2023-03-27 ENCOUNTER — Encounter (HOSPITAL_COMMUNITY): Payer: Self-pay

## 2023-03-31 ENCOUNTER — Ambulatory Visit (HOSPITAL_COMMUNITY)
Admission: RE | Admit: 2023-03-31 | Discharge: 2023-03-31 | Disposition: A | Discharge status: Home / Self Care | Payer: 59 | Source: Ambulatory Visit | Attending: Cardiology | Admitting: Cardiology

## 2023-03-31 DIAGNOSIS — I2510 Atherosclerotic heart disease of native coronary artery without angina pectoris: Secondary | ICD-10-CM | POA: Diagnosis not present

## 2023-03-31 DIAGNOSIS — R079 Chest pain, unspecified: Secondary | ICD-10-CM | POA: Diagnosis not present

## 2023-03-31 MED ORDER — NITROGLYCERIN 0.4 MG SL SUBL
SUBLINGUAL_TABLET | SUBLINGUAL | Status: AC
  Filled 2023-03-31: qty 2

## 2023-03-31 MED ORDER — NITROGLYCERIN 0.4 MG SL SUBL
0.8000 mg | SUBLINGUAL_TABLET | Freq: Once | SUBLINGUAL | Status: AC
  Administered 2023-03-31: 0.8 mg via SUBLINGUAL

## 2023-03-31 MED ORDER — IOHEXOL 350 MG/ML SOLN
100.0000 mL | Freq: Once | INTRAVENOUS | Status: AC | PRN
  Administered 2023-03-31: 100 mL via INTRAVENOUS

## 2023-03-31 NOTE — Progress Notes (Signed)
 Patient tolerated CT well. Vital signs stable encourage to drink water throughout day.Reasons explained and verbalized understanding. Ambulated steady gait.

## 2023-04-01 ENCOUNTER — Encounter: Payer: Self-pay | Admitting: *Deleted

## 2023-04-03 ENCOUNTER — Encounter: Payer: Self-pay | Admitting: Student

## 2023-04-09 ENCOUNTER — Other Ambulatory Visit (HOSPITAL_COMMUNITY): Payer: 59

## 2023-04-10 ENCOUNTER — Ambulatory Visit (HOSPITAL_COMMUNITY): Payer: 59 | Attending: Cardiology

## 2023-04-10 DIAGNOSIS — R011 Cardiac murmur, unspecified: Secondary | ICD-10-CM | POA: Insufficient documentation

## 2023-04-10 LAB — ECHOCARDIOGRAM COMPLETE
Area-P 1/2: 6.83 cm2
S' Lateral: 2.8 cm

## 2023-04-10 MED ORDER — PERFLUTREN LIPID MICROSPHERE
1.0000 mL | INTRAVENOUS | Status: AC | PRN
  Administered 2023-04-10: 1 mL via INTRAVENOUS

## 2023-04-14 ENCOUNTER — Telehealth: Payer: Self-pay | Admitting: Cardiology

## 2023-04-14 NOTE — Telephone Encounter (Signed)
 Pt calling to get Echo Results

## 2023-04-14 NOTE — Telephone Encounter (Signed)
 Danielle Ishikawa, MD 04/10/2023 10:21 PM EST     No significant abnormalities     Patient identification verified by 2 forms. [name], RN    Called and spoke to patient  Relayed Echo results  Advised patient to keep 6/3 OV with Dr. Antoine Poche  Patient verbalized understanding, no questions at this time

## 2023-04-15 ENCOUNTER — Other Ambulatory Visit: Payer: Self-pay | Admitting: Student

## 2023-04-15 DIAGNOSIS — Z1231 Encounter for screening mammogram for malignant neoplasm of breast: Secondary | ICD-10-CM

## 2023-04-17 ENCOUNTER — Telehealth: Payer: Self-pay

## 2023-04-17 NOTE — Telephone Encounter (Signed)
 Spoke to patient echo results given.She wanted to ask Dr.Schumann if he was going to prescribe cholesterol medications since last lipid panel was abnormal.Advised I will send message to Dr.Schumann for advice.

## 2023-04-18 NOTE — Telephone Encounter (Signed)
Spoke to patient Dr.Schumann's advice given.

## 2023-04-18 NOTE — Telephone Encounter (Signed)
 Heart arteries looked good on CT scan.  She has been intolerant to statins.  Would continue zetia and work on diet/exercise, can hold off on additional medication at this time

## 2023-04-19 ENCOUNTER — Other Ambulatory Visit: Payer: Self-pay | Admitting: Student

## 2023-04-21 ENCOUNTER — Telehealth: Payer: Self-pay | Admitting: Cardiology

## 2023-04-21 NOTE — Telephone Encounter (Signed)
 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

## 2023-04-21 NOTE — Telephone Encounter (Signed)
 [name] with [facility] calling to report abnormal CT results.

## 2023-04-23 NOTE — Telephone Encounter (Signed)
 See result note.

## 2023-05-05 ENCOUNTER — Other Ambulatory Visit: Payer: Self-pay | Admitting: Student

## 2023-05-05 DIAGNOSIS — I10 Essential (primary) hypertension: Secondary | ICD-10-CM

## 2023-05-07 ENCOUNTER — Ambulatory Visit: Payer: 59 | Admitting: Student

## 2023-05-23 ENCOUNTER — Other Ambulatory Visit: Payer: Self-pay | Admitting: Student

## 2023-05-23 DIAGNOSIS — I10 Essential (primary) hypertension: Secondary | ICD-10-CM

## 2023-05-26 ENCOUNTER — Other Ambulatory Visit: Payer: Self-pay | Admitting: Student

## 2023-05-26 DIAGNOSIS — G5700 Lesion of sciatic nerve, unspecified lower limb: Secondary | ICD-10-CM

## 2023-06-02 NOTE — Progress Notes (Unsigned)
 Chief Complaint: Primary GI MD: Dr. Elvin Hammer  HPI: Discussed the use of AI scribe software for clinical note transcription with the patient, who gave verbal consent to proceed.  History of Present Illness      PREVIOUS GI WORKUP   Colonoscopy March 2024 - Diverticulosis - Otherwise normal -- repeat 2034  Past Medical History:  Diagnosis Date   Arthritis    Breast CA (HCC)    breast CA- right breast   Diabetes mellitus    Hyperlipidemia    Hypertension    Thrombocytopenia (HCC)    Thyroid  disease    parathyroid  removed    Past Surgical History:  Procedure Laterality Date   ABDOMINAL HYSTERECTOMY     1996 per pt   BREAST BIOPSY Right 06/2009   BREAST SURGERY  09/12/2010   excision of skin mass - right breast, had another surgery to remove more Cancer cells   COLONOSCOPY     INNER EAR SURGERY  06/01/2013   MASTECTOMY Right 07/21/2009   PARATHYROIDECTOMY     partial?   REDUCTION MAMMAPLASTY Left 2013    Current Outpatient Medications  Medication Sig Dispense Refill   amLODipine  (NORVASC ) 5 MG tablet Take 1 tablet (5 mg total) by mouth daily. 90 tablet 3   Blood Glucose Monitoring Suppl (ONETOUCH VERIO REFLECT) w/Device KIT USE TO CHECK BLOOD SUGAR ONCE PER DAY 1 kit 0   carvedilol  (COREG ) 12.5 MG tablet TAKE 1 TABLET BY MOUTH TWICE DAILY WITH A MEAL 180 tablet 0   ezetimibe  (ZETIA ) 10 MG tablet Take 1 tablet (10 mg total) by mouth daily. 90 tablet 3   fluticasone  (FLONASE  ALLERGY RELIEF) 50 MCG/ACT nasal spray Place 2 sprays into both nostrils daily. (Patient not taking: Reported on 03/10/2023) 16 g 0   glucose blood (ONETOUCH VERIO) test strip Use as instructed (Patient taking differently: 1 each by Other route as needed for other (As needed). Use as instructed. Last reading: 12-30  AM (Fasting): 120) 100 each 0   hydrochlorothiazide  (HYDRODIURIL ) 25 MG tablet Take 1 tablet by mouth once daily 90 tablet 0   Lancet Devices (ONE TOUCH DELICA LANCING DEV) MISC Please  use to check blood sugar once per day. E11.9 (Patient taking differently: daily as needed. Please use to check blood sugar once per day. E11.9) 1 each 0   Lancets (ONETOUCH DELICA PLUS LANCET33G) MISC USE  1 TO CHECK GLUCOSE ONCE DAILY (Patient taking differently: daily as needed. USE  1 TO CHECK GLUCOSE ONCE DAILY) 100 each 0   lidocaine  (LIDODERM ) 5 % Place 1 patch onto the skin daily. Remove & Discard patch within 12 hours or as directed by MD 30 patch 0   lisinopril  (ZESTRIL ) 40 MG tablet Take 1 tablet by mouth once daily 90 tablet 0   Magnesium  400 MG TABS Take 800 mg by mouth daily. 60 tablet 0   metFORMIN  (GLUCOPHAGE -XR) 500 MG 24 hr tablet TAKE 2 TABLETS BY MOUTH TWICE DAILY WITH AMEAL. 120 tablet 0   metoprolol  tartrate (LOPRESSOR ) 100 MG tablet Take 1 tablet (100 mg total) by mouth once for 1 dose. 1 tablet 0   polyethylene glycol powder (GLYCOLAX /MIRALAX ) 17 GM/SCOOP powder Take 17 g by mouth 2 (two) times daily as needed for moderate constipation. 1020 g 0   traMADol  (ULTRAM ) 50 MG tablet TAKE 1 TABLET BY MOUTH EVERY 6 HOURS AS NEEDED 30 tablet 0   No current facility-administered medications for this visit.    Allergies as of 06/03/2023   (  No Known Allergies)    Family History  Problem Relation Age of Onset   CAD Mother 43       CABG   Stroke Father    Colon polyps Sister    Heart attack Brother 14   Colon cancer Neg Hx    Esophageal cancer Neg Hx    Rectal cancer Neg Hx    Stomach cancer Neg Hx     Social History   Socioeconomic History   Marital status: Married    Spouse name: Not on file   Number of children: 2   Years of education: Not on file   Highest education level: Not on file  Occupational History   Occupation: Disability  Tobacco Use   Smoking status: Never    Passive exposure: Past   Smokeless tobacco: Never  Vaping Use   Vaping status: Never Used  Substance and Sexual Activity   Alcohol use: No   Drug use: No   Sexual activity: Not Currently     Birth control/protection: Surgical  Other Topics Concern   Not on file  Social History Narrative   Pt lives with husband and in [place].   Does not work or go to school.   Walks daily    Exercises with walking in yard.   Drinks 2 cans sweetened soda daily.   Social Drivers of Corporate investment banker Strain: Low Risk  (05/06/2022)   Overall Financial Resource Strain (CARDIA)    Difficulty of Paying Living Expenses: Not hard at all  Food Insecurity: Low Risk  (11/04/2022)   Received from [facility]   Hunger Vital Sign    Worried About Running Out of Food in the Last Year: Never true    Ran Out of Food in the Last Year: Never true  Transportation Needs: No Transportation Needs (11/04/2022)   Received from [facility]    In the past 12 months, has lack of reliable transportation kept you from medical appointments, meetings, work or from getting things needed for daily living? : No  Physical Activity: Sufficiently Active (05/06/2022)   Exercise Vital Sign    Days of Exercise per Week: 3 days    Minutes of Exercise per Session: 60 min  Stress: No Stress Concern Present (05/06/2022)   [employer] of Occupational Health - Occupational Stress Questionnaire    Feeling of Stress : Not at all  Social Connections: Moderately Isolated (05/06/2022)   Social Connection and Isolation Panel [NHANES]    Frequency of Communication with Friends and Family: More than three times a week    Frequency of Social Gatherings with Friends and Family: Twice a week    Attends Religious Services: Never    Database administrator or Organizations: No    Attends Banker Meetings: Never    Marital Status: Married  Catering manager Violence: Not At Risk (05/06/2022)   Humiliation, Afraid, Rape, and Kick questionnaire    Fear of Current or Ex-Partner: No    Emotionally Abused: No    Physically Abused: No    Sexually Abused: No    Review of Systems:    Constitutional:  No weight loss, fever, chills, weakness or fatigue HEENT: Eyes: No change in vision               Ears, Nose, Throat:  No change in hearing or congestion Skin: No rash or itching Cardiovascular: No chest pain, chest pressure or palpitations   Respiratory: No SOB  or cough Gastrointestinal: See HPI and otherwise negative Genitourinary: No dysuria or change in urinary frequency Neurological: No headache, dizziness or syncope Musculoskeletal: No Danielle muscle or joint pain Hematologic: No bleeding or bruising Psychiatric: No history of depression or anxiety    Physical Exam:  Vital signs: There were no vitals taken for this visit.  Constitutional: NAD, alert and cooperative Head:  Normocephalic and atraumatic. Eyes:   PEERL, EOMI. No icterus. Conjunctiva pink. Respiratory: Respirations even and unlabored. Lungs clear to auscultation bilaterally.   No wheezes, crackles, or rhonchi.  Cardiovascular:  Regular rate and rhythm. No peripheral edema, cyanosis or pallor.  Gastrointestinal:  Soft, nondistended, nontender. No rebound or guarding. Normal bowel sounds. No appreciable masses or hepatomegaly. Rectal:  Declines Msk:  Symmetrical without gross deformities. Without edema, no deformity or joint abnormality.  Neurologic:  Alert and  oriented x4;  grossly normal neurologically.  Skin:   Dry and intact without significant lesions or rashes. Psychiatric: Oriented to person, place and time. Demonstrates good judgement and reason without abnormal affect or behaviors.  Physical Exam    RELEVANT LABS AND IMAGING: CBC    Component Value Date/Time   WBC 7.4 03/03/2023 0759   RBC 5.21 (H) 03/03/2023 0759   HGB 12.9 03/03/2023 0759   HGB 13.6 02/27/2023 1030   HGB 12.6 11/22/2016 0900   HCT 41.0 03/03/2023 0759   HCT 41.3 02/27/2023 1030   HCT 40.0 11/22/2016 0900   PLT 107 (L) 03/03/2023 0759   PLT 101 (L) 02/27/2023 1030   MCV 78.7 (L) 03/03/2023 0759   MCV 76 (L) 02/27/2023 1030    MCV 79.1 (L) 11/22/2016 0900   MCH 24.8 (L) 03/03/2023 0759   MCHC 31.5 03/03/2023 0759   RDW 14.6 03/03/2023 0759   RDW 13.7 02/27/2023 1030   RDW 15.0 (H) 11/22/2016 0900   LYMPHSABS 3.4 03/02/2023 1553   LYMPHSABS 3.5 (H) 05/13/2022 1715   LYMPHSABS 3.2 11/22/2016 0900   MONOABS 0.4 03/02/2023 1553   MONOABS 0.5 11/22/2016 0900   EOSABS 0.2 03/02/2023 1553   EOSABS 0.1 05/13/2022 1715   BASOSABS 0.0 03/02/2023 1553   BASOSABS 0.1 05/13/2022 1715   BASOSABS 0.0 11/22/2016 0900    CMP     Component Value Date/Time   NA 142 03/26/2023 0836   NA 140 11/22/2016 0900   K 4.8 03/26/2023 0836   K 4.5 11/22/2016 0900   CL 105 03/26/2023 0836   CL 105 02/21/2012 0806   CO2 24 03/26/2023 0836   CO2 28 11/22/2016 0900   GLUCOSE 91 03/26/2023 0836   GLUCOSE 124 (H) 03/03/2023 0759   GLUCOSE 109 11/22/2016 0900   GLUCOSE 135 (H) 02/21/2012 0806   BUN 10 03/26/2023 0836   BUN 13.6 11/22/2016 0900   CREATININE 0.70 03/26/2023 0836   CREATININE 0.97 11/24/2017 0805   CREATININE 0.9 11/22/2016 0900   CALCIUM  9.6 03/26/2023 0836   CALCIUM  9.9 11/22/2016 0900   PROT 7.2 03/02/2023 1553   PROT 6.7 10/24/2022 1221   PROT 7.5 11/22/2016 0900   ALBUMIN 3.8 03/02/2023 1553   ALBUMIN 4.3 10/24/2022 1221   ALBUMIN 3.5 11/22/2016 0900   AST 18 03/02/2023 1553   AST 35 11/24/2017 0805   AST 47 (H) 11/22/2016 0900   ALT 12 03/02/2023 1553   ALT 35 11/24/2017 0805   ALT 37 11/22/2016 0900   ALKPHOS 37 (L) 03/02/2023 1553   ALKPHOS 38 (L) 11/22/2016 0900   BILITOT 0.7 03/02/2023 1553   BILITOT  0.3 10/24/2022 1221   BILITOT 0.4 11/24/2017 0805   BILITOT 0.37 11/22/2016 0900   GFRNONAA >60 03/03/2023 0759   GFRNONAA >60 11/24/2017 0805   GFRNONAA 67 08/15/2015 1342   GFRAA 87 01/14/2020 1003   GFRAA >60 11/24/2017 0805   GFRAA 77 08/15/2015 1342     Assessment/Plan:   Assessment and Plan Assessment & Plan    Fatty liver Previous hepatic imaging revealed hepatic cysts and  hepatic steatosis. Elastography did not show any significant fibrosis.  Recent visit with Dr. Elvin Hammer October 2024 with normal CBC, CMP, AFP. Normal CMP Jan 2024. Repeat ultrasound October 2024 and multiple hepatic cysts measuring 5.6 cm, normal parenchymal echogenicity  Obesity On semaglutide   Breast Cancer  Thrombocytopenia  [name] [name] 06/02/2023, 9:21 AM  Cc: Goble Last, MD

## 2023-06-03 ENCOUNTER — Encounter: Payer: Self-pay | Admitting: Gastroenterology

## 2023-06-03 ENCOUNTER — Ambulatory Visit (INDEPENDENT_AMBULATORY_CARE_PROVIDER_SITE_OTHER): Admitting: Gastroenterology

## 2023-06-03 VITALS — BP 158/90 | HR 88 | Ht 63.0 in | Wt 173.1 lb

## 2023-06-03 DIAGNOSIS — I10 Essential (primary) hypertension: Secondary | ICD-10-CM

## 2023-06-03 DIAGNOSIS — E669 Obesity, unspecified: Secondary | ICD-10-CM

## 2023-06-03 DIAGNOSIS — D1803 Hemangioma of intra-abdominal structures: Secondary | ICD-10-CM

## 2023-06-03 DIAGNOSIS — K760 Fatty (change of) liver, not elsewhere classified: Secondary | ICD-10-CM | POA: Diagnosis not present

## 2023-06-03 DIAGNOSIS — Z1211 Encounter for screening for malignant neoplasm of colon: Secondary | ICD-10-CM

## 2023-06-03 DIAGNOSIS — K7689 Other specified diseases of liver: Secondary | ICD-10-CM | POA: Diagnosis not present

## 2023-06-03 DIAGNOSIS — D696 Thrombocytopenia, unspecified: Secondary | ICD-10-CM

## 2023-06-03 NOTE — Patient Instructions (Signed)
 Please follow up sooner if symptoms increase or worsen  Due to recent changes in healthcare laws, you may see the results of your imaging and laboratory studies on MyChart before your provider has had a chance to review them.  We understand that in some cases there may be results that are confusing or concerning to you. Not all laboratory results come back in the same time frame and the provider may be waiting for multiple results in order to interpret others.  Please give us  48 hours in order for your provider to thoroughly review all the results before contacting the office for clarification of your results.   _______________________________________________________  If your blood pressure at your visit was 140/90 or greater, please contact your primary care physician to follow up on this.  _______________________________________________________  If you are age 63 or older, your body mass index should be between 23-30. Your Body mass index is 30.67 kg/m. If this is out of the aforementioned range listed, please consider follow up with your Primary Care Provider.  If you are age 63 or younger, your body mass index should be between 19-25. Your Body mass index is 30.67 kg/m. If this is out of the aformentioned range listed, please consider follow up with your Primary Care Provider.   ________________________________________________________  The [facility] providers would like to encourage you to use MYCHART to communicate with providers for non-urgent requests or questions.  Due to long hold times on the telephone, sending your provider a message by [facility] may be a faster and more efficient way to get a response.  Please allow 48 business hours for a response.  Please remember that this is for non-urgent requests.  _______________________________________________________ Thank you for trusting me with your gastrointestinal care!   Suzanna Erp, PA

## 2023-06-03 NOTE — Progress Notes (Signed)
 Noted.

## 2023-06-04 ENCOUNTER — Other Ambulatory Visit: Payer: Self-pay | Admitting: Student

## 2023-06-16 ENCOUNTER — Ambulatory Visit (INDEPENDENT_AMBULATORY_CARE_PROVIDER_SITE_OTHER): Payer: 59

## 2023-06-16 VITALS — Ht 63.0 in | Wt 173.0 lb

## 2023-06-16 DIAGNOSIS — Z0000 Encounter for general adult medical examination without abnormal findings: Secondary | ICD-10-CM | POA: Diagnosis not present

## 2023-06-16 NOTE — Progress Notes (Signed)
 Because this visit was a virtual/telehealth visit,  certain criteria was not obtained, such a blood pressure, CBG if applicable, and timed get up and go. Any medications not marked as "taking" were not mentioned during the medication reconciliation part of the visit. Any vitals not documented were not able to be obtained due to this being a telehealth visit or patient was unable to self-report a recent blood pressure reading due to a lack of equipment at home via telehealth. Vitals that have been documented are verbally provided by the patient.   Subjective:   [name] is a 63 y.o. who presents for a Medicare Wellness preventive visit.  As a reminder, Annual Wellness Visits don't include a physical exam, and some assessments may be limited, especially if this visit is performed virtually. We may recommend an in-person visit if needed.  Visit Complete: Virtual I connected with  [name] on 06/16/23 by a audio enabled telemedicine application and verified that I am speaking with the correct person using two identifiers.  Patient Location: Home  Provider Location: Office/Clinic  I discussed the limitations of evaluation and management by telemedicine. The patient expressed understanding and agreed to proceed.  Vital Signs: Because this visit was a virtual/telehealth visit, some criteria may be missing or patient reported. Any vitals not documented were not able to be obtained and vitals that have been documented are patient reported.  VideoDeclined- This patient declined Librarian, academic. Therefore the visit was completed with audio only.  Persons Participating in Visit: Patient.  AWV Questionnaire: No: Patient Medicare AWV questionnaire was not completed prior to this visit.  Cardiac Risk Factors include: advanced age (>66men, >78 women);diabetes mellitus;dyslipidemia;family history of premature cardiovascular disease;hypertension;obesity (BMI  >30kg/m2)     Objective:     Today's Vitals   06/16/23 0835 06/16/23 0836  Weight: 173 lb (78.5 kg)   Height: 5\' 3"  (1.6 m)   PainSc: 5  5   PainLoc: Shoulder    Body mass index is 30.65 kg/m.     06/16/2023    8:38 AM 03/03/2023    7:52 AM 03/02/2023    3:29 PM 02/06/2023    9:29 AM 10/24/2022    9:55 AM 06/03/2022    9:35 AM 05/13/2022    1:53 PM  Advanced Directives  Does Patient Have a Medical Advance Directive? Yes Yes Yes No No No No  Type of Estate agent of [place];Living will Living will       Copy of Healthcare Power of Attorney in Chart? No - copy requested        Would patient like information on creating a medical advance directive?    No - Patient declined  No - Patient declined No - Patient declined    Current Medications (verified) Outpatient Encounter Medications as of 06/16/2023  Medication Sig   Blood Glucose Monitoring Suppl (ONETOUCH VERIO REFLECT) w/Device KIT USE TO CHECK BLOOD SUGAR ONCE PER DAY   carvedilol  (COREG ) 12.5 MG tablet TAKE 1 TABLET BY MOUTH TWICE DAILY WITH A MEAL   ezetimibe  (ZETIA ) 10 MG tablet Take 1 tablet (10 mg total) by mouth daily.   glucose blood (ONETOUCH VERIO) test strip Use as instructed (Patient taking differently: 1 each by Other route as needed for other (As needed). Use as instructed. Last reading: 12-30  AM (Fasting): 120)   Lancet Devices (ONE TOUCH DELICA LANCING DEV) MISC Please use to check blood sugar once per day. E11.9 (Patient taking  differently: daily as needed. Please use to check blood sugar once per day. E11.9)   Lancets (ONETOUCH DELICA PLUS LANCET33G) MISC USE  1 TO CHECK GLUCOSE ONCE DAILY (Patient taking differently: daily as needed. USE  1 TO CHECK GLUCOSE ONCE DAILY)   lisinopril  (ZESTRIL ) 40 MG tablet Take 1 tablet by mouth once daily   metFORMIN  (GLUCOPHAGE -XR) 500 MG 24 hr tablet TAKE 2 TABLETS BY MOUTH TWICE DAILY WITH A MEAL.   traMADol  (ULTRAM ) 50 MG tablet TAKE 1 TABLET BY MOUTH  EVERY 6 HOURS AS NEEDED   amLODipine  (NORVASC ) 5 MG tablet Take 1 tablet (5 mg total) by mouth daily.   No facility-administered encounter medications on file as of 06/16/2023.    Allergies (verified) Patient has no known allergies.   History: Past Medical History:  Diagnosis Date   Arthritis    Breast CA (HCC)    breast CA- right breast   Diabetes mellitus    Hyperlipidemia    Hypertension    Thrombocytopenia (HCC)    Thyroid  disease    parathyroid  removed   Past Surgical History:  Procedure Laterality Date   ABDOMINAL HYSTERECTOMY     1996 per pt   BREAST BIOPSY Right 06/2009   BREAST SURGERY  09/12/2010   excision of skin mass - right breast, had another surgery to remove more Cancer cells   COLONOSCOPY     INNER EAR SURGERY  06/01/2013   MASTECTOMY Right 07/21/2009   PARATHYROIDECTOMY     partial?   REDUCTION MAMMAPLASTY Left 2013   Family History  Problem Relation Age of Onset   CAD Mother 56       CABG   Stroke Father    Colon polyps Sister    Heart attack Brother 5   Colon cancer Neg Hx    Esophageal cancer Neg Hx    Rectal cancer Neg Hx    Stomach cancer Neg Hx    Social History   Socioeconomic History   Marital status: Married    Spouse name: Not on file   Number of children: 2   Years of education: Not on file   Highest education level: Not on file  Occupational History   Occupation: Disability  Tobacco Use   Smoking status: Never    Passive exposure: Past   Smokeless tobacco: Never  Vaping Use   Vaping status: Never Used  Substance and Sexual Activity   Alcohol use: No   Drug use: No   Sexual activity: Not Currently    Birth control/protection: Surgical  Other Topics Concern   Not on file  Social History Narrative   Pt lives with husband and in [place].   Does not work or go to school.   Walks daily    Exercises with walking in yard.   Drinks 2 cans sweetened soda daily.   Social Drivers of Corporate investment banker  Strain: Low Risk  (06/16/2023)   Overall Financial Resource Strain (CARDIA)    Difficulty of Paying Living Expenses: Not hard at all  Food Insecurity: No Food Insecurity (06/16/2023)   Hunger Vital Sign    Worried About Running Out of Food in the Last Year: Never true    Ran Out of Food in the Last Year: Never true  Transportation Needs: No Transportation Needs (06/16/2023)   PRAPARE - Administrator, Civil Service (Medical): No    Lack of Transportation (Non-Medical): No  Physical Activity: Sufficiently Active (06/16/2023)   Exercise Vital  Sign    Days of Exercise per Week: 3 days    Minutes of Exercise per Session: 60 min  Stress: No Stress Concern Present (06/16/2023)   [employer] of Occupational Health - Occupational Stress Questionnaire    Feeling of Stress : Not at all  Social Connections: Moderately Isolated (06/16/2023)   Social Connection and Isolation Panel [NHANES]    Frequency of Communication with Friends and Family: More than three times a week    Frequency of Social Gatherings with Friends and Family: Twice a week    Attends Religious Services: Never    Database administrator or Organizations: No    Attends Engineer, structural: Never    Marital Status: Married    Tobacco Counseling Counseling given: Not Answered    Clinical Intake:  Pre-visit preparation completed: Yes  Pain : 0-10 Pain Score: 5  Pain Type: Chronic pain Pain Location: Shoulder Pain Orientation: Left, Right     BMI - recorded: 30.65 Nutritional Status: BMI > 30  Obese Nutritional Risks: None Diabetes: Yes CBG done?: No Did pt. bring in CBG monitor from home?: No  Lab Results  Component Value Date   HGBA1C 6.6 10/24/2022   HGBA1C 6.0 05/13/2022   HGBA1C 6.0 10/03/2021     How often do you need to have someone help you when you read instructions, pamphlets, or other written materials from your doctor or pharmacy?: 1 - Never  Interpreter Needed?:  No  Information entered by :: Druscilla Gerhard, LPN.   Activities of Daily Living     06/16/2023    8:40 AM  In your present state of health, do you have any difficulty performing the following activities:  Hearing? 0  Vision? 0  Difficulty concentrating or making decisions? 0  Walking or climbing stairs? 0  Dressing or bathing? 0  Doing errands, shopping? 0  Preparing Food and eating ? N  Using the Toilet? N  In the past six months, have you accidently leaked urine? N  Do you have problems with loss of bowel control? N  Managing your Medications? N  Managing your Finances? N  Housekeeping or managing your Housekeeping? N    Patient Care Team: Goble Last, MD as PCP - General (Family Medicine) Eilleen Grates, MD as PCP - Cardiology (Cardiology) Mignon Alberts, DPM as Consulting Physician (Podiatry) Garr Kalata, PA-C as Consulting Physician (Gastroenterology) Daleen Dubs, DO as Consulting Physician (Otolaryngology) Devin Foerster, MD as Consulting Physician (Ophthalmology)  Indicate any recent Medical Services you may have received from other than [facility] providers in the past year (date may be approximate).     Assessment:    This is a routine wellness examination for [name].  Hearing/Vision screen Hearing Screening - Comments:: Denies hearing difficulties.  Vision Screening - Comments:: Wears reading glasses - up to date with routine eye exams with [facility]    Goals Addressed               This Visit's Progress     06/16/2023: To maintain my health. (pt-stated)         Depression Screen     06/16/2023    8:40 AM 03/07/2023    8:56 AM 02/27/2023   10:08 AM 02/21/2023    2:07 PM 10/24/2022    9:55 AM 06/03/2022    9:55 AM 05/13/2022    1:53 PM  PHQ 2/9 Scores  PHQ - 2 Score 0 0 0 0 0 0 0  PHQ- 9 Score 0 0 0 0 0 0 0    Fall Risk     06/16/2023    8:38 AM 02/21/2023    2:04 PM 05/06/2022    3:07 PM 03/22/2022    1:31 PM 03/15/2022    10:53 AM  Fall Risk   Falls in the past year? 0 0 0 1 0  Number falls in past yr: 0 0 0  0  Injury with Fall? 0 0 0  0  Risk for fall due to : No Fall Risks  No Fall Risks  No Fall Risks  Follow up Falls prevention discussed;Falls evaluation completed  Falls evaluation completed  Falls evaluation completed    MEDICARE RISK AT HOME:  Medicare Risk at Home Any stairs in or around the home?: Yes (outside) If so, are there any without handrails?: Yes Home free of loose throw rugs in walkways, pet beds, electrical cords, etc?: Yes Adequate lighting in your home to reduce risk of falls?: Yes Life alert?: No Use of a cane, walker or w/c?: No Grab bars in the bathroom?: No Shower chair or bench in shower?: No Elevated toilet seat or a handicapped toilet?: No  TIMED UP AND GO:  Was the test performed?  No  Cognitive Function: 6CIT completed    06/16/2023    8:50 AM  MMSE - Mini Mental State Exam  Not completed: Unable to complete        06/16/2023    8:39 AM 05/06/2022    3:12 PM  6CIT Screen  What Year? 0 points 0 points  What month? 0 points 0 points  What time? 0 points 0 points  Count back from 20 0 points 0 points  Months in reverse 0 points 0 points  Repeat phrase 0 points 0 points  Total Score 0 points 0 points    Immunizations Immunization History  Administered Date(s) Administered   Influenza Split 11/09/2010, 11/12/2011   Influenza, Seasonal, Injecte, Preservative Fre 10/24/2022   Influenza,inj,Quad PF,6+ Mos 11/13/2012, 11/17/2015, 11/28/2016, 12/02/2017, 11/13/2018, 11/30/2019, 12/22/2020, 11/06/2021   Influenza-Unspecified 11/04/2013, 11/13/2014   PFIZER Comirnaty(Gray Top)Covid-19 Tri-Sucrose Vaccine 12/04/2021   PFIZER(Purple Top)SARS-COV-2 Vaccination 04/20/2019, 05/11/2019, 12/15/2019   PNEUMOCOCCAL CONJUGATE-20 09/19/2020   PPD Test 01/16/2011, 06/27/2015   Pfizer(Comirnaty)Fall Seasonal Vaccine 12 years and older 12/04/2021   Pneumococcal  Polysaccharide-23 12/06/1998, 11/13/2012   Td 02/04/1994   Tdap 11/13/2012    Screening Tests Health Maintenance  Topic Date Due   Zoster Vaccines- Shingrix (1 of 2) Never done   FOOT EXAM  05/22/2022   COVID-19 Vaccine (5 - 2024-25 season) 10/06/2022   DTaP/Tdap/Td (3 - Td or Tdap) 11/14/2022   Diabetic kidney evaluation - Urine ACR  12/26/2022   HEMOGLOBIN A1C  04/23/2023   OPHTHALMOLOGY EXAM  07/26/2023   INFLUENZA VACCINE  09/05/2023   Diabetic kidney evaluation - eGFR measurement  03/25/2024   Medicare Annual Wellness (AWV)  06/15/2024   MAMMOGRAM  07/22/2024   Colonoscopy  04/18/2032   Pneumococcal Vaccine 85-47 Years old  Completed   Hepatitis C Screening  Completed   HIV Screening  Completed   HPV VACCINES  Aged Out   Meningococcal B Vaccine  Aged Out    Health Maintenance  Health Maintenance Due  Topic Date Due   Zoster Vaccines- Shingrix (1 of 2) Never done   FOOT EXAM  05/22/2022   COVID-19 Vaccine (5 - 2024-25 season) 10/06/2022   DTaP/Tdap/Td (3 - Td or Tdap) 11/14/2022   Diabetic kidney  evaluation - Urine ACR  12/26/2022   HEMOGLOBIN A1C  04/23/2023   Health Maintenance Items Addressed: Yes Patient aware of current care gaps.  Additional Screening:  Vision Screening: Recommended annual ophthalmology exams for early detection of glaucoma and other disorders of the eye.  Dental Screening: Recommended annual dental exams for proper oral hygiene  Community Resource Referral / Chronic Care Management: CRR required this visit?  No   CCM required this visit?  No   Plan:    I have personally reviewed and noted the following in the patient's chart:   Medical and social history Use of alcohol, tobacco or illicit drugs  Current medications and supplements including opioid prescriptions. Patient is not currently taking opioid prescriptions. Functional ability and status Nutritional status Physical activity Advanced directives List of other  physicians Hospitalizations, surgeries, and ER visits in previous 12 months Vitals Screenings to include cognitive, depression, and falls Referrals and appointments  In addition, I have reviewed and discussed with patient certain preventive protocols, quality metrics, and best practice recommendations. A written personalized care plan for preventive services as well as general preventive health recommendations were provided to patient.   Margette Sheldon, LPN   1/61/0960   After Visit Summary: (MyChart) Due to this being a telephonic visit, the after visit summary with patients personalized plan was offered to patient via MyChart   Nurse Notes: Patient is aware of current care gaps. [facility] verified, no recent vaccines.

## 2023-06-16 NOTE — Patient Instructions (Signed)
 Ms. [name] , Thank you for taking time out of your busy schedule to complete your Annual Wellness Visit with me. I enjoyed our conversation and look forward to speaking with you again next year. I, as well as your care team,  appreciate your ongoing commitment to your health goals. Please review the following plan we discussed and let me know if I can assist you in the future. Your Game plan/ To Do List    Referrals: None placed during this visit.  Follow up Visits: Next Medicare AWV with our clinical staff: is scheduled for 06/17/2024 at 8:30 a.m. PHONE VISIT    Have you seen your provider in the last 6 months (3 months if uncontrolled diabetes)? Yes Next Office Visit with your provider: 06/18/2023 at 8:30 a.m. w  Clinician Recommendations:  Aim for 30 minutes of exercise or brisk walking, 6-8 glasses of water, and 5 servings of fruits and vegetables each day.       This is a list of the screening recommended for you and due dates:  Health Maintenance  Topic Date Due   Zoster (Shingles) Vaccine (1 of 2) Never done   Complete foot exam   05/22/2022   COVID-19 Vaccine (5 - 2024-25 season) 10/06/2022   DTaP/Tdap/Td vaccine (3 - Td or Tdap) 11/14/2022   Yearly kidney health urinalysis for diabetes  12/26/2022   Hemoglobin A1C  04/23/2023   Eye exam for diabetics  07/26/2023   Flu Shot  09/05/2023   Yearly kidney function blood test for diabetes  03/25/2024   Medicare Annual Wellness Visit  06/15/2024   Mammogram  07/22/2024   Colon Cancer Screening  04/18/2032   Pneumococcal Vaccination  Completed   Hepatitis C Screening  Completed   HIV Screening  Completed   HPV Vaccine  Aged Out   Meningitis B Vaccine  Aged Out    Advanced directives: (Copy Requested) Please bring a copy of your health care power of attorney and living will to the office to be added to your chart at your convenience. You can mail to [facility] [address] [place], [place] [address] or email to  [email or web address] Advance Care Planning is important because it:  [x]  Makes sure you receive the medical care that is consistent with your values, goals, and preferences  [x]  It provides guidance to your family and loved ones and reduces their decisional burden about whether or not they are making the right decisions based on your wishes.  Follow the link provided in your after visit summary or read over the paperwork we have mailed to you to help you started getting your Advance Directives in place. If you need assistance in completing these, please reach out to us  so that we can help you!  See attachments for Preventive Care and Fall Prevention Tips.

## 2023-06-18 ENCOUNTER — Ambulatory Visit (INDEPENDENT_AMBULATORY_CARE_PROVIDER_SITE_OTHER): Admitting: Student

## 2023-06-18 ENCOUNTER — Encounter: Payer: Self-pay | Admitting: Student

## 2023-06-18 VITALS — BP 138/82 | HR 74 | Ht 63.0 in | Wt 177.2 lb

## 2023-06-18 DIAGNOSIS — E119 Type 2 diabetes mellitus without complications: Secondary | ICD-10-CM

## 2023-06-18 DIAGNOSIS — I10 Essential (primary) hypertension: Secondary | ICD-10-CM | POA: Diagnosis not present

## 2023-06-18 DIAGNOSIS — E118 Type 2 diabetes mellitus with unspecified complications: Secondary | ICD-10-CM | POA: Diagnosis not present

## 2023-06-18 LAB — POCT GLYCOSYLATED HEMOGLOBIN (HGB A1C): HbA1c, POC (controlled diabetic range): 6.1 % (ref 0.0–7.0)

## 2023-06-18 MED ORDER — CARVEDILOL 25 MG PO TABS: 25.0000 mg | ORAL_TABLET | Freq: Two times a day (BID) | ORAL | 2 refills | Status: DC

## 2023-06-18 NOTE — Assessment & Plan Note (Signed)
 Initially elevated today but improved on recheck.  Does have upper and lower extremity edema since starting amlodipine  5 mg and present on exam today.  Will discontinue the amlodipine  and increase Coreg  to 25 mg twice daily. -Continue lisinopril  40 mg daily - Discontinued amlodipine  5 mg and increased Coreg  to 25 mg twice daily - Advised to keep a blood pressure log for the next 2 weeks and return to reassess BP.

## 2023-06-18 NOTE — Assessment & Plan Note (Signed)
>>  ASSESSMENT AND PLAN FOR DM (DIABETES MELLITUS) WITH COMPLICATIONS (HCC) WRITTEN ON 06/18/2023  9:09 AM BY ROSENDO RUSH, MD  Well-controlled.  A1c obtained today improved to 6.1%.  No hypoglycemia or hyperglycemic episodes reported.  Patient wanted to explore restarting Ozempic  given slight weight gain per discussion with patient we will consider starting low-dose Ozempic  at 3 months follow-up if she continues to gain weight. -A1c, microalbuminuria obtained today - Continue metformin  1000 mg twice daily - Encouraged to continue working on diet and increased activity

## 2023-06-18 NOTE — Progress Notes (Signed)
    SUBJECTIVE:   CHIEF COMPLAINT / HPI:   63 year old female with history of type 2 diabetes and HTN Presenting today for diabetes follow-up Last A1c 6 months ago was 6.6% Current medication include metformin  1000 mg twice daily Other meds include lisinopril , Ezetimibe  Denies any hypoglycemia or hyperglycemia episodes  Hypertension Recently saw her cardiologist for palpitation.  Who discontinued her hydrochlorothiazide  and started patient on amlodipine .  However today patient reports about lower extremity edema since starting on amlodipine  5 mg daily Oral blood pressure meds include Coreg  12.5 mg twice daily and lisinopril  40 mg daily.   PERTINENT  PMH / PSH: Reviewed  OBJECTIVE:   BP 138/82   Pulse 74   Ht 5\' 3"  (1.6 m)   Wt 177 lb 3.2 oz (80.4 kg)   SpO2 100%   BMI 31.39 kg/m    Physical Exam General: Alert, well appearing, NAD Cardiovascular: RRR, No Murmurs, Normal S2/S2 Respiratory: CTAB, No wheezing or Rales Abdomen: No distension or tenderness Extremities: Bilateral upper and lower extremity +1 edema Foot exam: NVI intact, no lesions.  ASSESSMENT/PLAN:   Essential hypertension Initially elevated today but improved on recheck.  Does have upper and lower extremity edema since starting amlodipine  5 mg and present on exam today.  Will discontinue the amlodipine  and increase Coreg  to 25 mg twice daily. -Continue lisinopril  40 mg daily - Discontinued amlodipine  5 mg and increased Coreg  to 25 mg twice daily - Advised to keep a blood pressure log for the next 2 weeks and return to reassess BP.  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HCC) Well-controlled.  A1c obtained today improved to 6.1%.  No hypoglycemia or hyperglycemic episodes reported.  Patient wanted to explore restarting Ozempic  given slight weight gain per discussion with patient we will consider starting low-dose Ozempic  at 3 months follow-up if she continues to gain weight. -A1c, microalbuminuria  obtained today - Continue metformin  1000 mg twice daily - Encouraged to continue working on diet and increased activity   Health maintenance Mammogram scheduled for 6/109/25  Goble Last, MD [facility]

## 2023-06-18 NOTE — Assessment & Plan Note (Signed)
 Well-controlled.  A1c obtained today improved to 6.1%.  No hypoglycemia or hyperglycemic episodes reported.  Patient wanted to explore restarting Ozempic  given slight weight gain per discussion with patient we will consider starting low-dose Ozempic  at 3 months follow-up if she continues to gain weight. -A1c, microalbuminuria obtained today - Continue metformin  1000 mg twice daily - Encouraged to continue working on diet and increased activity

## 2023-06-18 NOTE — Patient Instructions (Addendum)
 Pleasure to see you today.  Your A1c today improved to 6.1%  Blood pressure today was elevated.  Because of the swellings caused by your amlodipine  I recommends stopping the amlodipine .  I have increased your Coreg  to 25 mg.  Check your blood pressure daily for the next 2 months.  Keep a record of your blood pressure and let us  see what is showing in 2 weeks when you come back.

## 2023-06-21 LAB — MICROALBUMIN / CREATININE URINE RATIO
Creatinine, Urine: 83.6 mg/dL
Microalb/Creat Ratio: 4 mg/g{creat} (ref 0–29)
Microalbumin, Urine: 3.6 ug/mL

## 2023-06-23 ENCOUNTER — Ambulatory Visit: Payer: Self-pay | Admitting: Student

## 2023-07-01 ENCOUNTER — Emergency Department (HOSPITAL_COMMUNITY)
Admission: EM | Admit: 2023-07-01 | Discharge: 2023-07-02 | Disposition: A | Discharge status: Home / Self Care | Attending: Emergency Medicine | Admitting: Emergency Medicine

## 2023-07-01 ENCOUNTER — Encounter (HOSPITAL_COMMUNITY): Payer: Self-pay

## 2023-07-01 ENCOUNTER — Other Ambulatory Visit: Payer: Self-pay

## 2023-07-01 DIAGNOSIS — I10 Essential (primary) hypertension: Secondary | ICD-10-CM | POA: Diagnosis present

## 2023-07-01 DIAGNOSIS — I1A0 Resistant hypertension: Secondary | ICD-10-CM | POA: Diagnosis not present

## 2023-07-01 DIAGNOSIS — Z79899 Other long term (current) drug therapy: Secondary | ICD-10-CM | POA: Diagnosis not present

## 2023-07-01 NOTE — ED Triage Notes (Signed)
 Pt reports her BP was 182/100 tonight. She states she has been monitoring it all day and it has progressively increased. She took her Carvedilol  and Lisinopril  today. Her PCP has been changing her BP meds and told her to continue to monitor her BP, which is why she took her BP today. Denies current headache but reports she feels anxious.

## 2023-07-02 ENCOUNTER — Ambulatory Visit: Admitting: Family Medicine

## 2023-07-02 DIAGNOSIS — I10 Essential (primary) hypertension: Secondary | ICD-10-CM | POA: Diagnosis not present

## 2023-07-02 LAB — COMPREHENSIVE METABOLIC PANEL WITH GFR
ALT: 15 U/L (ref 0–44)
AST: 17 U/L (ref 15–41)
Albumin: 3.7 g/dL (ref 3.5–5.0)
Alkaline Phosphatase: 47 U/L (ref 38–126)
Anion gap: 11 (ref 5–15)
BUN: 8 mg/dL (ref 8–23)
CO2: 26 mmol/L (ref 22–32)
Calcium: 9.8 mg/dL (ref 8.9–10.3)
Chloride: 103 mmol/L (ref 98–111)
Creatinine, Ser: 0.81 mg/dL (ref 0.44–1.00)
GFR, Estimated: >60 mL/min (ref >60)
Glucose, Bld: 117 mg/dL — ABNORMAL HIGH (ref 70–99)
Potassium: 3.9 mmol/L (ref 3.5–5.1)
Sodium: 140 mmol/L (ref 135–145)
Total Bilirubin: 0.8 mg/dL (ref 0.0–1.2)
Total Protein: 7.3 g/dL (ref 6.5–8.1)

## 2023-07-02 LAB — CBC
HCT: 41.2 % (ref 36.0–46.0)
Hemoglobin: 12.8 g/dL (ref 12.0–15.0)
MCH: 23.4 pg — ABNORMAL LOW (ref 26.0–34.0)
MCHC: 31.1 g/dL (ref 30.0–36.0)
MCV: 75.2 fL — ABNORMAL LOW (ref 80.0–100.0)
Platelets: 106 10*3/uL — ABNORMAL LOW (ref 150–400)
RBC: 5.48 MIL/uL — ABNORMAL HIGH (ref 3.87–5.11)
RDW: 14.6 % (ref 11.5–15.5)
WBC: 8.7 10*3/uL (ref 4.0–10.5)
nRBC: 0 % (ref 0.0–0.2)

## 2023-07-02 MED ORDER — ALPRAZOLAM 0.25 MG PO TABS
0.5000 mg | ORAL_TABLET | Freq: Once | ORAL | Status: AC
  Administered 2023-07-02: 0.5 mg via ORAL
  Filled 2023-07-02: qty 2

## 2023-07-02 MED ORDER — ALPRAZOLAM 0.5 MG PO TABS: 0.5000 mg | ORAL_TABLET | Freq: Two times a day (BID) | ORAL | 0 refills | Status: DC | PRN

## 2023-07-02 MED ORDER — LABETALOL HCL 5 MG/ML IV SOLN
10.0000 mg | Freq: Once | INTRAVENOUS | Status: DC
  Filled 2023-07-02: qty 4

## 2023-07-02 NOTE — Discharge Instructions (Signed)
 Take carvedilol  25mg  TWICE a day

## 2023-07-02 NOTE — ED Provider Notes (Addendum)
  EMERGENCY DEPARTMENT AT [facility] Provider Note   CSN: [id number] Arrival date & time: 07/01/23  2259     History  Chief Complaint  Patient presents with   Hypertension    [name] is a 63 y.o. female.  Patient presents to the urgency department for evaluation of elevated blood pressures.  Patient had recent medication changes and was told to watch her blood pressures.  Her blood pressure was over 200 systolic prior to coming to the ED.  No chest pain or shortness of breath.  No headache.       Home Medications Prior to Admission medications   Medication Sig Start Date End Date Taking? Authorizing Provider  Blood Glucose Monitoring Suppl (ONETOUCH VERIO REFLECT) w/Device KIT USE TO CHECK BLOOD SUGAR ONCE PER DAY 12/04/21   Goble Last, MD  carvedilol  (COREG ) 25 MG tablet Take 1 tablet (25 mg total) by mouth 2 (two) times daily with a meal. 06/18/23   Goble Last, MD  ezetimibe  (ZETIA ) 10 MG tablet Take 1 tablet (10 mg total) by mouth daily. 10/24/22   Goble Last, MD  glucose blood (ONETOUCH VERIO) test strip Use as instructed Patient taking differently: 1 each by Other route as needed for other (As needed). Use as instructed. Last reading: 12-30  AM (Fasting): 120 12/31/21   Goble Last, MD  Lancet Devices (ONE TOUCH DELICA LANCING DEV) MISC Please use to check blood sugar once per day. E11.9 Patient taking differently: daily as needed. Please use to check blood sugar once per day. E11.9 05/29/20   Cresenzo, John V, MD  Lancets (ONETOUCH DELICA PLUS LANCET33G) MISC USE  1 TO CHECK GLUCOSE ONCE DAILY Patient taking differently: daily as needed. USE  1 TO CHECK GLUCOSE ONCE DAILY 06/05/21   Cresenzo, John V, MD  lisinopril  (ZESTRIL ) 40 MG tablet Take 1 tablet by mouth once daily 02/10/23   Dahbura, Anton, DO  metFORMIN  (GLUCOPHAGE -XR) 500 MG 24 hr tablet TAKE 2 TABLETS BY MOUTH TWICE DAILY WITH A MEAL. 06/05/23   Goble Last, MD  traMADol  (ULTRAM )  50 MG tablet TAKE 1 TABLET BY MOUTH EVERY 6 HOURS AS NEEDED 05/27/23   Goble Last, MD      Allergies    Patient has no known allergies.    Review of Systems   Review of Systems  Physical Exam Updated Vital Signs BP (!) 207/96 (BP Location: Left Arm)   Pulse 95   Temp 97.7 F (36.5 C)   Resp 19   Ht 5\' 3"  (1.6 m)   Wt 80.3 kg   SpO2 98%   BMI 31.35 kg/m  Physical Exam Vitals and nursing note reviewed.  Constitutional:      General: She is not in acute distress.    Appearance: She is well-developed.  HENT:     Head: Normocephalic and atraumatic.     Mouth/Throat:     Mouth: Mucous membranes are moist.  Eyes:     General: Vision grossly intact. Gaze aligned appropriately.     Extraocular Movements: Extraocular movements intact.     Conjunctiva/sclera: Conjunctivae normal.  Cardiovascular:     Rate and Rhythm: Normal rate and regular rhythm.     Pulses: Normal pulses.     Heart sounds: Normal heart sounds, S1 normal and S2 normal. No murmur heard.    No friction rub. No gallop.  Pulmonary:     Effort: Pulmonary effort is normal. No respiratory distress.     Breath sounds:  Normal breath sounds.  Abdominal:     General: Bowel sounds are normal.     Palpations: Abdomen is soft.     Tenderness: There is no abdominal tenderness. There is no guarding or rebound.     Hernia: No hernia is present.  Musculoskeletal:        General: No swelling.     Cervical back: Full passive range of motion without pain, normal range of motion and neck supple. No spinous process tenderness or muscular tenderness. Normal range of motion.     Right lower leg: No edema.     Left lower leg: No edema.  Skin:    General: Skin is warm and dry.     Capillary Refill: Capillary refill takes less than 2 seconds.     Findings: No ecchymosis, erythema, rash or wound.  Neurological:     General: No focal deficit present.     Mental Status: She is alert and oriented to person, place, and time.      GCS: GCS eye subscore is 4. GCS verbal subscore is 5. GCS motor subscore is 6.     Cranial Nerves: Cranial nerves 2-12 are intact.     Sensory: Sensation is intact.     Motor: Motor function is intact.     Coordination: Coordination is intact.  Psychiatric:        Attention and Perception: Attention normal.        Mood and Affect: Mood normal.        Speech: Speech normal.        Behavior: Behavior normal.     ED Results / Procedures / Treatments   Labs (all labs ordered are listed, but only abnormal results are displayed) Labs Reviewed  CBC - Abnormal; Notable for the following components:      Result Value   RBC 5.48 (*)    MCV 75.2 (*)    MCH 23.4 (*)    Platelets 106 (*)    All other components within normal limits  COMPREHENSIVE METABOLIC PANEL WITH GFR - Abnormal; Notable for the following components:   Glucose, Bld 117 (*)    All other components within normal limits    EKG EKG Interpretation Date/Time:  Tuesday Jul 01 2023 23:11:15 EDT Ventricular Rate:  83 PR Interval:  202 QRS Duration:  74 QT Interval:  354 QTC Calculation: 415 R Axis:   78  Text Interpretation: Normal sinus rhythm Normal ECG When compared with ECG of 10-Mar-2023 15:01, No acute changes Confirmed by Ballard Bongo [phone]) on 07/02/2023 4:25:14 AM  Radiology No results found.  Procedures Procedures    Medications Ordered in ED Medications  labetalol (NORMODYNE) injection 10 mg (has no administration in time range)    ED Course/ Medical Decision Making/ A&P                                 Medical Decision Making Amount and/or Complexity of Data Reviewed Labs: ordered.  Risk Prescription drug management.   Patient was previously on hydrochlorothiazide , Coreg  12.5 mg twice daily, lisinopril  40 mg p.o. daily.  Hydrochlorothiazide  was stopped by her cardiologist and she was started on Norvasc  5 mg daily.  Blood pressures have been running high since this change.  Norvasc   was subsequently stopped because of swelling.  Her doctor increased her Coreg  to 25 mg daily (not twice daily) because of the high blood pressures.  Basic labs  and EKG unremarkable here.  Patient did take carvedilol  for coming to the ED.  Blood pressure is coming down.  She still feels anxious.  Given Xanax 0.5 mg p.o. and blood pressure is significantly improved.  She will be instructed to take the Coreg  25 mg twice a day and monitor.  She has follow-up with her doctor on June 3.        Final Clinical Impression(s) / ED Diagnoses Final diagnoses:  Resistant hypertension    Rx / DC Orders ED Discharge Orders     None         Arville Postlewaite, Marine Sia, MD 07/02/23 [id number]    Ballard Bongo, MD 07/02/23 [phone]

## 2023-07-06 NOTE — Progress Notes (Unsigned)
  Cardiology Office Note:   Date:  07/08/2023  ID:  [name], DOB 1960-06-27, MRN [id number] PCP: Goble Last, MD  [facility] Providers Cardiologist:  Eilleen Grates, MD {  History of Present Illness:   [name] is a 63 y.o. female who I saw in 2020 for palpitations.  She has had issues with HTN recently.   She saw Dr. Alda Amas earlier this year. She had chest pain and coronary CTA demonstrated.  She had a calcium  score of zero and very low plaque burden.  She had an echo for murmur and no significant valve abnormalities.  She had a normal EF.  Zio for palpitations demonstrated NSR with rare ectopy and no sustained arrhythmias.    She was in the ED a couple of days ago with hypertensive urgency.  I reviewed these records for this visit.   There were no objective findings.  I do see that she was given Xanax  with improvement in her blood pressure.  She has been feeling okay although she gets some nighttime palpitations and a little burning.  She is very anxious.  Her blood pressure is up again today.  She is not having any new cardiovascular symptoms.  She walks an hour a day with that she had not recently.  She reports that at home her blood pressure is in the 150s over 90s typically.  ROS: As stated in the HPI and negative for all other systems.  Studies Reviewed:    EKG:   NA   Risk Assessment/Calculations:           Physical Exam:   VS:  BP (!) 190/100 (BP Location: Left Arm, Patient Position: Sitting, Cuff Size: Large)   Pulse 97   Ht 5\' 3"  (1.6 m)   Wt 174 lb (78.9 kg)   SpO2 97%   BMI 30.82 kg/m    Wt Readings from Last 3 Encounters:  07/08/23 174 lb (78.9 kg)  07/07/23 176 lb 12.8 oz (80.2 kg)  07/01/23 177 lb (80.3 kg)     GEN: Well nourished, well developed in no acute distress NECK: No JVD; No carotid bruits CARDIAC: RRR, no murmurs, rubs, gallops RESPIRATORY:  Clear to auscultation without rales, wheezing or rhonchi  ABDOMEN: Soft,  non-tender, non-distended EXTREMITIES:  No edema; No deformity   ASSESSMENT AND PLAN:   HTN: Her blood pressure is significantly elevated.  I will stop her lisinopril  and start Cozaar 100 mg daily.  She can get a basic metabolic profile in about 10 days and we will see her back before that time also around that time we will check potassium again and titrate her meds.  We will give her a copy of salty 6 and a blood pressure diary to keep.      Palpitations: She has had no significant findings on the monitor.  No change in therapy.  Chest pain: Patient's chest pain is nonanginal.  No further cardiac workup.  Dyslipidemia: Her LDL is 148.  She has other significant cardiovascular risk factors.  She had some mild coronary plaque on CT.  I think the goal LDL should be less than 100 and so I am going to start pravastatin  80 mg daily.  She did not have problems with this in the past.  Follow up Pharm.D. Hypertension Clinic  Signed, Eilleen Grates, MD

## 2023-07-07 ENCOUNTER — Encounter: Payer: Self-pay | Admitting: Student

## 2023-07-07 ENCOUNTER — Ambulatory Visit (INDEPENDENT_AMBULATORY_CARE_PROVIDER_SITE_OTHER): Admitting: Student

## 2023-07-07 VITALS — BP 180/90 | HR 80 | Ht 63.0 in | Wt 176.8 lb

## 2023-07-07 DIAGNOSIS — I10 Essential (primary) hypertension: Secondary | ICD-10-CM

## 2023-07-07 MED ORDER — SPIRONOLACTONE 25 MG PO TABS
25.0000 mg | ORAL_TABLET | Freq: Every day | ORAL | 3 refills | Status: DC
Start: 2023-07-07 — End: 2023-10-27

## 2023-07-07 NOTE — Patient Instructions (Signed)
 Pleasure to see you today.  Your blood pressure today was elevated and this is consistent with your home BP readings as well.  Please continue to take your Coreg  25 mg 2 times daily and 40 mg lisinopril .  I have also added a new medication spironolactone  which you will take 25 mg once daily.  Please follow-up in a week let us  check your blood pressure and your electrolytes.

## 2023-07-07 NOTE — Progress Notes (Signed)
    SUBJECTIVE:   CHIEF COMPLAINT / HPI:   63 year old female presenting for BP follow up Currently on lisinopril  40 mg daily and Coreg  25 mg twice daily Recently discontinued amlodipine  5mg  due to LE edema and coreg  was increased to 25mg  BID. Home BP readings show blood pressure range from 150-170/90-100. Previously on HCTZ but was discontinued her cardiologist due to concerns of tachycardia/ palpitations He denies any headache, chest pain or vision changes.   PERTINENT  PMH / PSH: Reviewed   OBJECTIVE:   BP (!) 180/90   Pulse 80   Ht 5\' 3"  (1.6 m)   Wt 176 lb 12.8 oz (80.2 kg)   SpO2 99%   BMI 31.32 kg/m    Physical Exam General: Alert, well appearing, NAD Cardiovascular: RRR, No Murmurs, Normal S2/S2 Respiratory: CTAB, No wheezing or Rales Abdomen: No distension or tenderness Extremities: No edema on extremities   Skin: Warm and dry  ASSESSMENT/PLAN:   Essential hypertension Poorly controlled on current blood pressure regimen including Coreg  25 mg twice daily and lisinopril  40 mg daily.  BP readings today elevated however patient currently asymptomatic.  BP is not well-controlled we will add spironolactone  25 mg daily. - Add spironolactone  25 mg daily - Continue Coreg  and lisinopril  as prescribed - Encouraged to keep daily blood pressure logs - Follow-up in a week to check BMP and reassess response to new BP meds     Goble Last, MD [facility]

## 2023-07-07 NOTE — Assessment & Plan Note (Signed)
 Poorly controlled on current blood pressure regimen including Coreg  25 mg twice daily and lisinopril  40 mg daily.  BP readings today elevated however patient currently asymptomatic.  BP is not well-controlled we will add spironolactone  25 mg daily. - Add spironolactone  25 mg daily - Continue Coreg  and lisinopril  as prescribed - Encouraged to keep daily blood pressure logs - Follow-up in a week to check BMP and reassess response to new BP meds

## 2023-07-08 ENCOUNTER — Ambulatory Visit: Payer: 59 | Attending: Cardiology | Admitting: Cardiology

## 2023-07-08 ENCOUNTER — Encounter: Payer: Self-pay | Admitting: Cardiology

## 2023-07-08 VITALS — BP 190/100 | HR 97 | Ht 63.0 in | Wt 174.0 lb

## 2023-07-08 DIAGNOSIS — I10 Essential (primary) hypertension: Secondary | ICD-10-CM

## 2023-07-08 DIAGNOSIS — E785 Hyperlipidemia, unspecified: Secondary | ICD-10-CM

## 2023-07-08 MED ORDER — LOSARTAN POTASSIUM 100 MG PO TABS: 100.0000 mg | ORAL_TABLET | Freq: Every day | ORAL | 3 refills | Status: DC

## 2023-07-08 MED ORDER — PRAVASTATIN SODIUM 80 MG PO TABS: 80.0000 mg | ORAL_TABLET | Freq: Every day | ORAL | 3 refills | Status: DC

## 2023-07-08 NOTE — Patient Instructions (Signed)
 Medication Instructions:  Stop Lisinopril  Start Losartan 100 mg once daily Start Pravastatin  80 mg once daily *If you need a refill on your cardiac medications before your next appointment, please call your pharmacy*  Lab Work: Fasting lipid panel in 3 months If you have labs (blood work) drawn today and your tests are completely normal, you will receive your results only by: MyChart Message (if you have MyChart) OR A paper copy in the mail If you have any lab test that is abnormal or we need to change your treatment, we will call you to review the results.  Testing/Procedures: NONE  Follow-Up: At [facility], you and your health needs are our priority.  As part of our continuing mission to provide you with exceptional heart care, our providers are all part of one team.  This team includes your primary Cardiologist (physician) and Advanced Practice Providers or APPs (Physician Assistants and Nurse Practitioners) who all work together to provide you with the care you need, when you need it.  Your next appointment:   Follow up with Hypertension Clinic  Provider:     We recommend signing up for the patient portal called "MyChart".  Sign up information is provided on this After Visit Summary.  MyChart is used to connect with patients for Virtual Visits (Telemedicine).  Patients are able to view lab/test results, encounter notes, upcoming appointments, etc.  Non-urgent messages can be sent to your provider as well.   To learn more about what you can do with MyChart, go to [email or web address].   Other Instructions You have been referred to our Hypertension Clinic. Someone will reach out to you to make an appointment

## 2023-07-09 ENCOUNTER — Ambulatory Visit: Admitting: Student

## 2023-07-14 ENCOUNTER — Telehealth: Payer: Self-pay

## 2023-07-14 ENCOUNTER — Ambulatory Visit: Admitting: Student

## 2023-07-14 NOTE — Telephone Encounter (Signed)
 Left voice message (per DPR) to let pt know she has an active lab order for a lipid panel. Pt told to report to any [facility] at her earliest convenience. Pt told to call should she have any questions.

## 2023-07-15 ENCOUNTER — Ambulatory Visit (INDEPENDENT_AMBULATORY_CARE_PROVIDER_SITE_OTHER): Admitting: Student

## 2023-07-15 ENCOUNTER — Encounter: Payer: Self-pay | Admitting: Student

## 2023-07-15 VITALS — BP 164/96 | HR 65 | Ht 62.0 in | Wt 171.8 lb

## 2023-07-15 DIAGNOSIS — I10 Essential (primary) hypertension: Secondary | ICD-10-CM

## 2023-07-15 LAB — LIPID PANEL
Chol/HDL Ratio: 2.8 ratio (ref 0.0–4.4)
Cholesterol, Total: 188 mg/dL (ref 100–199)
HDL: 67 mg/dL (ref >39)
LDL Chol Calc (NIH): 100 mg/dL — ABNORMAL HIGH (ref 0–99)
Triglycerides: 122 mg/dL (ref 0–149)
VLDL Cholesterol Cal: 21 mg/dL (ref 5–40)

## 2023-07-15 NOTE — Assessment & Plan Note (Signed)
 BP elevated x 2 today.  Currently on losartan , Coreg  and spironolactone .  With good tolerance. - Patient advised to continue medications as prescribed - Will follow-up with Dr. Koval for ambulatory blood pressure monitoring - Encouraged to continue blood pressure logs - Will need BMP collected at next appointment with pharmacy, Future order already placed

## 2023-07-15 NOTE — Progress Notes (Signed)
    SUBJECTIVE:   CHIEF COMPLAINT / HPI:   63 year old female with history of hypertension Presenting today for blood pressure follow-up Recently added Spironolactone  25mg  daily to Coreg  25mg  BID and Lisinopril  40mg . Her Cardiologist recently changed lisinopril  to Coozar 100mg  Patient said BP is better controlled but  in the mornings can be elevated  BP recently has been in the 120-130s/80s Denies any chest pain, vision changes or SOB  PERTINENT  PMH / PSH: Reviewed   OBJECTIVE:   BP (!) 164/96   Pulse 65   Ht 5\' 2"  (1.575 m)   Wt 171 lb 12.8 oz (77.9 kg)   SpO2 96%   BMI 31.42 kg/m    Physical Exam General: Alert, well appearing, NAD Cardiovascular: RRR, No Murmurs, Normal S2/S2 Respiratory: CTAB, No wheezing or Rales Abdomen: No distension or tenderness Extremities: No edema on extremities     ASSESSMENT/PLAN:   Essential hypertension BP elevated x 2 today.  Currently on losartan , Coreg  and spironolactone .  With good tolerance. - Patient advised to continue medications as prescribed - Will follow-up with Dr. Koval for ambulatory blood pressure monitoring - Encouraged to continue blood pressure logs - Will need BMP collected at next appointment with pharmacy, Future order already placed    Goble Last, MD [facility]

## 2023-07-15 NOTE — Patient Instructions (Signed)
 Pleasure to see you today.  Your blood pressure today is slightly elevated.  Recommend we follow-up with our pharmacy team to do a 24-hour while blood pressure monitoring.  I will reach out to the pharmacist and he will contact you about scheduling an appointment for you to come in to have this set off.  In the meantime continue to check your blood pressure daily and write it down.  Continue to take your medications as prescribe.  NEED APPOINTMENT WITH Dr. KOVAL

## 2023-07-16 ENCOUNTER — Ambulatory Visit: Payer: Self-pay | Admitting: Cardiology

## 2023-07-17 ENCOUNTER — Ambulatory Visit: Admitting: Student

## 2023-07-24 ENCOUNTER — Ambulatory Visit
Admission: RE | Admit: 2023-07-24 | Discharge: 2023-07-24 | Disposition: A | Discharge status: Home / Self Care | Source: Ambulatory Visit | Attending: *Deleted | Admitting: *Deleted

## 2023-07-24 DIAGNOSIS — Z1231 Encounter for screening mammogram for malignant neoplasm of breast: Secondary | ICD-10-CM

## 2023-07-29 ENCOUNTER — Ambulatory Visit (INDEPENDENT_AMBULATORY_CARE_PROVIDER_SITE_OTHER): Admitting: Pharmacist

## 2023-07-29 ENCOUNTER — Encounter: Payer: Self-pay | Admitting: Pharmacist

## 2023-07-29 VITALS — BP 147/68 | HR 68 | Wt 175.8 lb

## 2023-07-29 DIAGNOSIS — I10 Essential (primary) hypertension: Secondary | ICD-10-CM

## 2023-07-29 DIAGNOSIS — E119 Type 2 diabetes mellitus without complications: Secondary | ICD-10-CM | POA: Diagnosis not present

## 2023-07-29 MED ORDER — OZEMPIC (0.25 OR 0.5 MG/DOSE) 2 MG/3ML ~~LOC~~ SOPN: 0.2500 mg | PEN_INJECTOR | SUBCUTANEOUS | 6 refills | Status: DC

## 2023-07-29 MED ORDER — HYDROCHLOROTHIAZIDE 12.5 MG PO TABS: 12.5000 mg | ORAL_TABLET | Freq: Every day | ORAL | 3 refills | Status: DC

## 2023-07-29 NOTE — Assessment & Plan Note (Signed)
 Diabetes - Willing to restart Ozempic  (semaglutide ) at lowest dose 0.25mg  weekly which patient tolerated well without significant appetite suppression to a level of anorexia.

## 2023-07-29 NOTE — Progress Notes (Signed)
 S:     Chief Complaint  Patient presents with   Medication Management    Hypertension   63 y.o. female who presents for hypertension evaluation, education, and management.   Patient was referred and last seen by Primary Care Provider, Dr. Rosendo, on 07/15/2023.  At last visit, with Cardiology blood pressure medications were adjusted.  Since that visit, patient has been performing and recording home blood pressure readings which are consistently elevated > 140 systolic and > 80 Diastolic.   PMH is significant for Diabetes and Hyperlipidemia.   Today, patient arrives in spirits and presents without  assistance.  Denies dizziness, headache, blurred vision.  Of note, patient reports increased swelling of hands bilaterally.   Patient reports hypertension was diagnosed in 2010.   Family/Social history: multiple family members have HTN  Medication adherence appears good. Patient has taken BP medications today.   Current antihypertensives include: Carvedilol  25mg  BID, losartan  100mg  daily, spironolactone  25mg  daily   Patient reported dietary habits: Eats multiple meals/day   O:  Review of Systems  Cardiovascular:  Negative for leg swelling.    Physical Exam Vitals reviewed.  Constitutional:      Appearance: Normal appearance.  Pulmonary:     Effort: Pulmonary effort is normal.   Musculoskeletal:     Right lower leg: No edema.     Left lower leg: No edema.     Comments: Positive for bilateral hand/wrist swelling 1+   Neurological:     Mental Status: She is alert.   Psychiatric:        Mood and Affect: Mood normal.        Behavior: Behavior normal.        Thought Content: Thought content normal.        Judgment: Judgment normal.   Last 3 Office BP readings: BP Readings from Last 3 Encounters:  07/29/23 (!) 147/68  07/15/23 (!) 164/96  07/08/23 (!) 190/100   BMET    Component Value Date/Time   NA 140 07/01/2023 2332   NA 142 03/26/2023 0836   NA 140  11/22/2016 0900   K 3.9 07/01/2023 2332   K 4.5 11/22/2016 0900   CL 103 07/01/2023 2332   CL 105 02/21/2012 0806   CO2 26 07/01/2023 2332   CO2 28 11/22/2016 0900   GLUCOSE 117 (H) 07/01/2023 2332   GLUCOSE 109 11/22/2016 0900   GLUCOSE 135 (H) 02/21/2012 0806   BUN 8 07/01/2023 2332   BUN 10 03/26/2023 0836   BUN 13.6 11/22/2016 0900   CREATININE 0.81 07/01/2023 2332   CREATININE 0.97 11/24/2017 0805   CREATININE 0.9 11/22/2016 0900   CALCIUM  9.8 07/01/2023 2332   CALCIUM  9.9 11/22/2016 0900   GFRNONAA >60 07/01/2023 2332   GFRNONAA >60 11/24/2017 0805   GFRNONAA 67 08/15/2015 1342   GFRAA 87 01/14/2020 1003   GFRAA >60 11/24/2017 0805   GFRAA 77 08/15/2015 1342    Clinical ASCVD:  The 10-year ASCVD risk score (Arnett DK, et al., 2019) is: 22.7%   Values used to calculate the score:     Age: 5 years     Clincally relevant sex: Female     Is Non-Hispanic African American: Yes     Diabetic: Yes     Tobacco smoker: No     Systolic Blood Pressure: 147 mmHg     Is BP treated: Yes     HDL Cholesterol: 67 mg/dL     Total Cholesterol: 188 mg/dL  Patient is participating  in a Managed Medicaid Plan:  Yes    A/P: Hypertension diagnosed in 2010.  Deferred amb BP evaluation today after review of home readings and consideration of potential combination therapy to improve symptoms and control. Currently on 3 BP medications. BP goal < 130/80 mmHg. Medication adherence appears good.   - Continued Carvedilol  25mg  BID.  - Continued Losartan  100mg  daily - Continued spironolactone  25mg  daily  - RE-STARTED low dose hydrochlorothiazide  12.5mg  in combination with dual agents that increase potassium.  -Patient educated on purpose, proper use, and potential adverse effects of  electrolyte disturbances.  - CMET today -Counseled on lifestyle modifications for blood pressure control including reduced dietary sodium, increased exercise, adequate sleep. -Encouraged patient to check BP at home  and bring log of readings to next visit.   Diabetes - Willing to restart Ozempic  (semaglutide ) at lowest dose 0.25mg  weekly which patient tolerated well without significant appetite suppression to a level of anorexia.   - Plan to stay on low dose Ozempic  - patient will need extra needle tips - provide at next visit.  Written patient instructions provided. Patient verbalized understanding of treatment plan.  Total time in face to face counseling 37 minutes.    Follow-up:  Pharmacist 7/21-22/2025 for Amb BP evaluation and repeat electrolyte labs. PCP clinic visit in PRN.

## 2023-07-29 NOTE — Patient Instructions (Signed)
 It was nice to see you today!    Your goal blood pressure is  <130/80 mm Hg    Medication Changes: - RE-START - hydrochlorothiazide  12.5 mg once daily   - RE-START Ozempic  (semaglutide ) 0.25 mg once weekly  - Continue all other medication the same.  Keep up the good work with diet and exercise. Aim for a diet full of vegetables, fruit and lean meats (chicken, [place], fish). Try to limit salt intake by eating fresh or frozen vegetables (instead of canned), rinse canned vegetables prior to cooking and do not add any additional salt to meals.   Monitor blood pressure at home daily and keep a log (on your phone or piece of paper) to bring with you to your next visit. Write down date, time, blood pressure and pulse.   Please bring all medications to your clinic visits.  Please arrive 10-15 minutes prior to your scheduled visit time.

## 2023-07-29 NOTE — Assessment & Plan Note (Signed)
 Hypertension diagnosed in 2010.  Deferred amb BP evaluation today after review of home readings and consideration of potential combination therapy to improve symptoms and control. Currently on 3 BP medications. BP goal < 130/80 mmHg. Medication adherence appears good.   - Continued Carvedilol  25mg  BID.  - Continued Losartan  100mg  daily - Continued spironolactone  25mg  daily  - RE-STARTED low dose hydrochlorothiazide  in combination with dual agents that increase potassium.  -Patient educated on purpose, proper use, and potential adverse effects of  electrolyte disturbances.  - CMET today -Counseled on lifestyle modifications for blood pressure control including reduced dietary sodium, increased exercise, adequate sleep. -Encouraged patient to check BP at home and bring log of readings to next visit.

## 2023-07-30 ENCOUNTER — Encounter: Payer: Self-pay | Admitting: Pharmacist

## 2023-07-30 LAB — COMPREHENSIVE METABOLIC PANEL WITH GFR
ALT: 12 IU/L (ref 0–32)
AST: 12 IU/L (ref 0–40)
Albumin: 4.3 g/dL (ref 3.9–4.9)
Alkaline Phosphatase: 60 IU/L (ref 44–121)
BUN/Creatinine Ratio: 13 (ref 12–28)
BUN: 11 mg/dL (ref 8–27)
Bilirubin Total: 0.4 mg/dL (ref 0.0–1.2)
CO2: 20 mmol/L (ref 20–29)
Calcium: 10 mg/dL (ref 8.7–10.3)
Chloride: 103 mmol/L (ref 96–106)
Creatinine, Ser: 0.87 mg/dL (ref 0.57–1.00)
Globulin, Total: 2.4 g/dL (ref 1.5–4.5)
Glucose: 104 mg/dL — ABNORMAL HIGH (ref 70–99)
Potassium: 4.6 mmol/L (ref 3.5–5.2)
Sodium: 142 mmol/L (ref 134–144)
Total Protein: 6.7 g/dL (ref 6.0–8.5)
eGFR: 75 mL/min/{1.73_m2} (ref >59)

## 2023-07-30 NOTE — Progress Notes (Signed)
 Reviewed and agree with Dr Macky Lower plan.

## 2023-08-04 ENCOUNTER — Other Ambulatory Visit: Payer: Self-pay | Admitting: Student

## 2023-08-04 DIAGNOSIS — I10 Essential (primary) hypertension: Secondary | ICD-10-CM

## 2023-08-21 ENCOUNTER — Other Ambulatory Visit: Payer: Self-pay | Admitting: Student

## 2023-08-21 DIAGNOSIS — I10 Essential (primary) hypertension: Secondary | ICD-10-CM

## 2023-08-22 ENCOUNTER — Other Ambulatory Visit: Payer: Self-pay | Admitting: Student

## 2023-08-22 DIAGNOSIS — G5700 Lesion of sciatic nerve, unspecified lower limb: Secondary | ICD-10-CM

## 2023-08-25 ENCOUNTER — Ambulatory Visit (INDEPENDENT_AMBULATORY_CARE_PROVIDER_SITE_OTHER): Admitting: Pharmacist

## 2023-08-25 ENCOUNTER — Encounter: Payer: Self-pay | Admitting: Pharmacist

## 2023-08-25 VITALS — BP 132/69 | HR 83 | Wt 172.6 lb

## 2023-08-25 DIAGNOSIS — E118 Type 2 diabetes mellitus with unspecified complications: Secondary | ICD-10-CM | POA: Diagnosis not present

## 2023-08-25 DIAGNOSIS — I10 Essential (primary) hypertension: Secondary | ICD-10-CM | POA: Diagnosis not present

## 2023-08-25 DIAGNOSIS — E119 Type 2 diabetes mellitus without complications: Secondary | ICD-10-CM | POA: Diagnosis not present

## 2023-08-25 MED ORDER — CONTOUR BLOOD GLUCOSE SYSTEM W/DEVICE KIT: 1.0000 | PACK | Freq: Once | 0 refills | Status: AC

## 2023-08-25 MED ORDER — GLUCOSE BLOOD VI STRP: ORAL_STRIP | 12 refills | Status: DC

## 2023-08-25 NOTE — Progress Notes (Signed)
 S:     Chief Complaint  Patient presents with   Medication Management    Blood Pressure Follow-Up   63 y.o. female who presents for hypertension evaluation, education, and management. Patient arrives in good spirits and presents without any assistance.   Patient was referred and last seen by Primary Care Provider, Dr. Rosendo, on 07/15/2023.  Last pharmacist visit with me was 07/29/2023.  At that time we started hydrochlorothiazide  12.5mg  once daily.   PMH is significant for diabetes, Hyperlipidemia, and hypertension.  Diagnosed with Hypertension in 2010.    Medication compliance is reported to be good.  Patient shared home blood pressure readings with multiple (~ 10) readings from her wrist cuff of 109-125/ 65-78 Following discussion of home readings and elevated in-office reading today, we agreed that the Amb Home monitoring with arm cuff was an appropriate next step. Discussed procedure for wearing the monitor and gave patient written instructions. Monitor was placed on non-dominant arm with instructions to return in the morning.   Current BP Medications include:  Carvedilol  25mg  BID, hydrochlorothiazide  12.5mg  daily, losartan  100mg  daily, spironolactone  25mg   Reports doing well with diabetes control.  Requests change in glucometer from [employer] to [employer] at the request of her insurance.    O:  Review of Systems  Cardiovascular:  Negative for leg swelling (swelling in leg resolved - trace hand/wrist swelling remains.).  All other systems reviewed and are negative.   Physical Exam Vitals reviewed.  Constitutional:      Appearance: Normal appearance.  Neurological:     Mental Status: She is alert.     Last 3 Office BP readings: BP Readings from Last 3 Encounters:  08/25/23 132/69  07/29/23 (!) 147/68  07/15/23 (!) 164/96    Clinical Atherosclerotic Cardiovascular Disease (ASCVD): No  The 10-year ASCVD risk score (Arnett DK, et al., 2019) is: 17.6%   Values  used to calculate the score:     Age: 61 years     Clincally relevant sex: Female     Is Non-Hispanic African American: Yes     Diabetic: Yes     Tobacco smoker: No     Systolic Blood Pressure: 132 mmHg     Is BP treated: Yes     HDL Cholesterol: 67 mg/dL     Total Cholesterol: 188 mg/dL  Basic Metabolic Panel    Component Value Date/Time   NA 142 07/29/2023 0944   NA 140 11/22/2016 0900   K 4.6 07/29/2023 0944   K 4.5 11/22/2016 0900   CL 103 07/29/2023 0944   CL 105 02/21/2012 0806   CO2 20 07/29/2023 0944   CO2 28 11/22/2016 0900   GLUCOSE 104 (H) 07/29/2023 0944   GLUCOSE 117 (H) 07/01/2023 2332   GLUCOSE 109 11/22/2016 0900   GLUCOSE 135 (H) 02/21/2012 0806   BUN 11 07/29/2023 0944   BUN 13.6 11/22/2016 0900   CREATININE 0.87 07/29/2023 0944   CREATININE 0.97 11/24/2017 0805   CREATININE 0.9 11/22/2016 0900   CALCIUM  10.0 07/29/2023 0944   CALCIUM  9.9 11/22/2016 0900   GFRNONAA >60 07/01/2023 2332   GFRNONAA >60 11/24/2017 0805   GFRNONAA 67 08/15/2015 1342   GFRAA 87 01/14/2020 1003   GFRAA >60 11/24/2017 0805   GFRAA 77 08/15/2015 1342    Renal function: CrCl cannot be calculated (Patient's most recent lab result is older than the maximum 21 days allowed.).   ABPM Study Data: Arm Placement left arm   For Office Goal BP  of <130/80 mmHg:  ABPM thresholds: Overall BP <125/75 mmHg, daytime BP <130/80 mmHg, sleeptime BP <110/65 mmHg   A/P: History of hypertension since 2010.  Requiring multiple drug therapy to control.  Overall tolerating current regimen without complaint. Goal presssure of <130/80 mm Hg.  Swelling of extremities improved with low dose hydrochlorothiazide .  Home wrist readings improved on current regimen.  -Placed blood pressure cuff, provided education, patient instructed to wear cuff for 24 hours and return tomorrow to review results.  Diabetes - reports good control based on home glucometer readings in the low 100s. Requests [employer] Cont[employer]  glucometer order to replace her onetouch meter as requested by her pharmacy.   - New meter, strips and supplies provided.    Written patient instructions provided including activity/symptom/event log. Patient verbalized understanding of plan. Total time in face to face counseling 28 minutes.    Follow-up: Tomorrow AM - early morning appointment 8:30 AM

## 2023-08-25 NOTE — Patient Instructions (Signed)
Blood Pressure Activity Diary Time Lying down/ Sleeping Walking/ Exercise Stressed/ Angry Headache/ Pain Dizzy  9 AM       10 AM       11 AM       12 PM       1 PM       2 PM       Time Lying down/ Sleeping Walking/ Exercise Stressed/ Angry Headache/ Pain Dizzy  3 PM       4 PM        5 PM       6 PM       7 PM       8 PM       Time Lying down/ Sleeping Walking/ Exercise Stressed/ Angry Headache/ Pain Dizzy  9 PM       10 PM       11 PM       12 AM       1 AM       2 AM       3 AM       Time Lying down/ Sleeping Walking/ Exercise Stressed/ Angry Headache/ Pain Dizzy  4 AM       5 AM       6 AM       7 AM       8 AM       9 AM       10 AM        Time you woke up: _________                  Time you went to sleep:__________  Come back tomorrow at 8:30 to have the monitor removed  Call the [facility] if you have any questions before then ([phone])  Wearing the Blood Pressure Monitor The cuff will inflate every 20 minutes during the day and every 30 minutes while you sleep. Fill out the blood pressure-activity diary during the day, especially during activities that may affect your reading -- such as exercise, stress, walking, taking your blood pressure medications  Important things to know: Avoid taking the monitor off for the next 24 hours, unless it causes you discomfort or pain. Do NOT get the monitor wet and do NOT try to clean the monitor with any cleaning products. Do NOT put the monitor on anyone else's arm. When the cuff inflates, avoid excess movement. Let the cuffed arm hang loosely, slightly away from the body. Avoid flexing the muscles or moving the hand/fingers. Remember to fill out the blood pressure activity diary. If you experience severe pain or unusual pain (not associated with getting your blood pressure checked), remove the monitor.  Troubleshooting:  Code  Troubleshooting   1  Check cuff position, tighten cuff   2, 3  Remain still  during reading   4, 87  Check air hose connections and make sure cuff is tight   85, 89  Check hose connections and make tubing is not crimped   86  Push START/STOP to restart reading   88, 91  Retry by pushing START/STOP   90  Replace batteries. If problem persists, remove monitor and bring back to   clinic at follow up   97, 98, 99  Service required - Remove monitor and bring back to clinic at follow up

## 2023-08-25 NOTE — Assessment & Plan Note (Signed)
 History of hypertension since 2010.  Requiring multiple drug therapy to control.  Overall tolerating current regimen without complaint. Goal presssure of <130/80 mm Hg.  Swelling of extremities improved with low dose hydrochlorothiazide .  Home wrist readings improved on current regimen.  -Placed blood pressure cuff, provided education, patient instructed to wear cuff for 24 hours and return tomorrow to review results.

## 2023-08-25 NOTE — Assessment & Plan Note (Signed)
 Diabetes - reports good control based on home glucometer readings in the low 100s. Requests [employer] Contour glucometer order to replace her onetouch meter as requested by her pharmacy.   - New meter, strips and supplies provided.

## 2023-08-26 ENCOUNTER — Encounter: Payer: Self-pay | Admitting: Pharmacist

## 2023-08-26 ENCOUNTER — Ambulatory Visit (INDEPENDENT_AMBULATORY_CARE_PROVIDER_SITE_OTHER): Admitting: Pharmacist

## 2023-08-26 VITALS — BP 123/61

## 2023-08-26 DIAGNOSIS — I10 Essential (primary) hypertension: Secondary | ICD-10-CM | POA: Diagnosis not present

## 2023-08-26 NOTE — Patient Instructions (Signed)
 It was nice to see you today!  Thank you for completing the blood pressure monitoring evaluation.  Your test showed a blood pressure of 123/61 while awake.   Your goal blood pressure is < 130/80 mmHg   Medication Changes:  None  Continue all other medication the same.   Monitor blood pressure at home and keep a log (on a piece of paper) to bring with you to your next visit.

## 2023-08-26 NOTE — Progress Notes (Signed)
 S:     Chief Complaint  Patient presents with   Medication Management    Blood Pressure evaluation - Day#2   63 y.o. female who returns for hypertension evaluation, education, and management. Patient arrives in good spirits and presents without any assistance.    Patient was referred and last seen by Primary Care Provider, Dr. Rosendo, on 07/15/2023.  Last pharmacist visit with me was 07/29/2023.  At that time we started hydrochlorothiazide  12.5mg  once daily.    PMH is significant for diabetes, Hyperlipidemia, and hypertension.   Diagnosed with Hypertension in 2010.    Patient returns to clinic with 24 hour blood pressure monitor and reports they were able to wear the ambulatory blood pressure cuff for the entire 24 evaluation period.    O:  Review of Systems  Neurological:  Negative for dizziness and headaches.  All other systems reviewed and are negative.   Physical Exam Vitals reviewed.  Constitutional:      Appearance: Normal appearance.  Pulmonary:     Effort: Pulmonary effort is normal.  Neurological:     Mental Status: She is alert.  Psychiatric:        Mood and Affect: Mood normal.        Behavior: Behavior normal.        Thought Content: Thought content normal.        Judgment: Judgment normal.    Last 3 Office BP readings: BP Readings from Last 3 Encounters:  08/26/23 123/61  08/25/23 132/69  07/29/23 (!) 147/68    Clinical Atherosclerotic Cardiovascular Disease (ASCVD): No  The 10-year ASCVD risk score (Arnett DK, et al., 2019) is: 14.8%   Values used to calculate the score:     Age: 20 years     Clincally relevant sex: Female     Is Non-Hispanic African American: Yes     Diabetic: Yes     Tobacco smoker: No     Systolic Blood Pressure: 123 mmHg     Is BP treated: Yes     HDL Cholesterol: 67 mg/dL     Total Cholesterol: 188 mg/dL  Basic Metabolic Panel    Component Value Date/Time   NA 142 07/29/2023 0944   NA 140 11/22/2016 0900   K 4.6  07/29/2023 0944   K 4.5 11/22/2016 0900   CL 103 07/29/2023 0944   CL 105 02/21/2012 0806   CO2 20 07/29/2023 0944   CO2 28 11/22/2016 0900   GLUCOSE 104 (H) 07/29/2023 0944   GLUCOSE 117 (H) 07/01/2023 2332   GLUCOSE 109 11/22/2016 0900   GLUCOSE 135 (H) 02/21/2012 0806   BUN 11 07/29/2023 0944   BUN 13.6 11/22/2016 0900   CREATININE 0.87 07/29/2023 0944   CREATININE 0.97 11/24/2017 0805   CREATININE 0.9 11/22/2016 0900   CALCIUM  10.0 07/29/2023 0944   CALCIUM  9.9 11/22/2016 0900   GFRNONAA >60 07/01/2023 2332   GFRNONAA >60 11/24/2017 0805   GFRNONAA 67 08/15/2015 1342   GFRAA 87 01/14/2020 1003   GFRAA >60 11/24/2017 0805   GFRAA 77 08/15/2015 1342    Renal function: CrCl cannot be calculated (Patient's most recent lab result is older than the maximum 21 days allowed.).   ABPM Study Data: Arm Placement left arm  Overall Mean 24hr BP:   122/60 mmHg  HR: 80  Daytime Mean BP:  123/61 mmHg  HR: 83  Nighttime Mean BP:  120/57 mmHg  HR: 72  Dipping Pattern: No.  Sys:   2.5%  Dia: 7%  [normal dipping ~10-20%]   For Office Goal BP of <130/80 mmHg:  ABPM thresholds: Overall BP <125/75 mmHg, daytime BP <130/80 mmHg, sleeptime BP <110/65 mmHg    A/P: History of hypertension since 2010, requiring multiple drug therapy.  No medication intolerance noted. Goal presssure of <130/80 mm Hg. Found to have normal (well controlled) blood pressure with both home (see note 7/21) and with with 24-hour ambulatory blood pressure evaluation which demonstrates an average AWAKE blood pressure of 123/61 mmHg.  Likely patient has an element of [place] hypertension. Nocturnal dipping pattern is abnormal with minimal dipping (patient reports she did not sleep well.   Changes to medications - None - Continue Carvedilol  25mg  BID, hydrochlorothiazide  12.5mg  daily, Losartan  100mg  daily, spironolactone  25mg  daily   Results reviewed and written information provided.    Written patient instructions  provided. Patient verbalized understanding of treatment plan.  Total time in face to face counseling 18 minutes.    Follow-up:  Pharmacist None planned PCP clinic visit in PRN 2-3 months  Patient seen with Dr. Camie Dixons, DO -  patient's next PCP

## 2023-08-26 NOTE — Progress Notes (Signed)
 Reviewed and agree with Dr Macky Lower plan.

## 2023-08-26 NOTE — Assessment & Plan Note (Signed)
 History of hypertension since 2010, requiring multiple drug therapy.  No medication intolerance noted. Goal presssure of <130/80 mm Hg. Found to have normal (well controlled) blood pressure with both home (see note 7/21) and with with 24-hour ambulatory blood pressure evaluation which demonstrates an average AWAKE blood pressure of 123/61 mmHg.  Likely patient has an element of [place] hypertension. Nocturnal dipping pattern is abnormal with minimal dipping (patient reports she did not sleep well.   Changes to medications - None - Continue Carvedilol  25mg  BID, hydrochlorothiazide  12.5mg  daily, Losartan  100mg  daily, spironolactone  25mg  daily

## 2023-08-27 ENCOUNTER — Encounter: Payer: Self-pay | Admitting: Pharmacist

## 2023-08-27 ENCOUNTER — Ambulatory Visit: Payer: Self-pay | Admitting: Family Medicine

## 2023-08-27 ENCOUNTER — Other Ambulatory Visit (HOSPITAL_COMMUNITY): Payer: Self-pay

## 2023-08-27 ENCOUNTER — Ambulatory Visit: Attending: Cardiovascular Disease | Admitting: Pharmacist

## 2023-08-27 VITALS — BP 138/80 | HR 79

## 2023-08-27 DIAGNOSIS — I10 Essential (primary) hypertension: Secondary | ICD-10-CM

## 2023-08-27 LAB — BASIC METABOLIC PANEL WITH GFR
BUN/Creatinine Ratio: 15 (ref 12–28)
BUN: 15 mg/dL (ref 8–27)
CO2: 21 mmol/L (ref 20–29)
Calcium: 10.5 mg/dL — ABNORMAL HIGH (ref 8.7–10.3)
Chloride: 99 mmol/L (ref 96–106)
Creatinine, Ser: 1.01 mg/dL — ABNORMAL HIGH (ref 0.57–1.00)
Glucose: 114 mg/dL — ABNORMAL HIGH (ref 70–99)
Potassium: 4.9 mmol/L (ref 3.5–5.2)
Sodium: 131 mmol/L — ABNORMAL LOW (ref 134–144)
eGFR: 63 mL/min/1.73 (ref >59)

## 2023-08-27 MED ORDER — LOSARTAN POTASSIUM-HCTZ 100-12.5 MG PO TABS
1.0000 | ORAL_TABLET | Freq: Every day | ORAL | 3 refills | Status: DC
  Filled 2023-08-27: qty 90, 90d supply, fill #0

## 2023-08-27 NOTE — Assessment & Plan Note (Signed)
 Assessment: In office BP 150/ 80 mmHg 1st reading and 2nd reading 138/80  (goal<130/80) home BP at goal and  ABPM data demonstrated well-controlled blood pressure, with an average awake BP of 123/61 mmHg ( per Dr.Koval's notes)  Tolerates current BP meds well without any side effects Denies SOB, palpitation, chest pain, or swelling Reiterated the importance of regular exercise and low salt diet   Plan:  To reduce pill burden will combine losartan  and hydrochlorothiazide  to one pill 100/12.5 mg  Patient is in agreement with the change  Continue taking carvedilol  25 mg twice daily and spironolactone  25 mg daily in the evening  Patient to keep record of BP readings with heart rate and report to us  if BP persistently remains elevated above the goal  Follow up lab(s) : none

## 2023-08-27 NOTE — Progress Notes (Signed)
 Patient ID: [name]                 DOB: 11/28/1960                      MRN: [id number]      HPI: [name] is a 63 y.o. female referred by Dr. Lavona  to HTN clinic. PMH is significant for palpitation, hypertension, DM, HLD   The patient presented today for follow-up in the hypertension clinic. Losartan  (Cozaar ) 100 mg daily was initiated on 07/08/2023 by family medicine, and a 24-hour ambulatory blood pressure monitoring (ABPM) was ordered. Hydrochlorothiazide  12.5 mg was subsequently added to her regimen, which already included carvedilol  25 mg twice daily, spironolactone  25 mg daily, and losartan . Comprehensive metabolic panel (CMP) results were within normal limits. ABPM data demonstrated well-controlled blood pressure, with an average awake BP of 123/61 mmHg, and home BP readings also consistently within target range. These findings suggest an element of [place] hypertension. The addition of hydrochlorothiazide  has brought her BP to goal and resolved her lower limb swelling. She denies dizziness, palpitations, shortness of breath, swelling, or chest pain. She does report occasional, mild headaches and notes feeling stressed during office visits. Her BP at home before today's visit was 120/73 mmHg, with occasional lower systolic values in the 98-99 mmHg range, though she remains asymptomatic. She tolerates her current antihypertensive regimen well. Her PCP has recently restarted Ozempic , and she is hopeful it will support weight loss efforts.  Current HTN meds: Carvedilol  25mg  BID, hydrochlorothiazide  12.5mg  daily, Losartan  100mg  daily, spironolactone  25mg  daily  Previously tried: Lisinopril  40 mg daily  BP goal: <130/80   Family History:  Relation Problem Comments  Mother (Deceased) CAD (Age: 37) CABG    Father (Deceased) Stroke     Sister - 2 Metallurgist) Colon polyps     Brother - 1 (Deceased)   Brother Heart attack (Age: 36)     Social History:  Alcohol: none   Smoking: none   Diet: low salt diet, does not eat out and does not add salt to her food   Exercise: walks 60 min 3 times week, willing to start some form of resistance exercise.    Home BP readings: 120/80 on wrist monitor    Wt Readings from Last 3 Encounters:  08/25/23 172 lb 9.6 oz (78.3 kg)  07/29/23 175 lb 12.8 oz (79.7 kg)  07/15/23 171 lb 12.8 oz (77.9 kg)   BP Readings from Last 3 Encounters:  08/27/23 138/80  08/26/23 123/61  08/25/23 132/69   Pulse Readings from Last 3 Encounters:  08/27/23 79  08/25/23 83  07/29/23 68    Renal function: Estimated Creatinine Clearance: 55.3 mL/min (A) (by C-G formula based on SCr of 1.01 mg/dL (H)).  Past Medical History:  Diagnosis Date   Arthritis    Breast CA (HCC)    breast CA- right breast   Diabetes mellitus    Hyperlipidemia    Hypertension    Thrombocytopenia (HCC)    Thyroid  disease    parathyroid  removed    Current Outpatient Medications on File Prior to Visit  Medication Sig Dispense Refill   carvedilol  (COREG ) 25 MG tablet Take 1 tablet (25 mg total) by mouth 2 (two) times daily with a meal. 60 tablet 2   ezetimibe  (ZETIA ) 10 MG tablet Take 1 tablet (10 mg total) by mouth daily. 90 tablet 3   glucose blood test strip Use as instructed - daily  testing 100 each 12   Lancet Devices (ONE TOUCH DELICA LANCING DEV) MISC Please use to check blood sugar once per day. E11.9 1 each 0   Lancets (ONETOUCH DELICA PLUS LANCET33G) MISC USE  1 TO CHECK GLUCOSE ONCE DAILY 100 each 0   metFORMIN  (GLUCOPHAGE -XR) 500 MG 24 hr tablet TAKE 2 TABLETS BY MOUTH TWICE DAILY WITH A MEAL. 120 tablet 5   pravastatin  (PRAVACHOL ) 80 MG tablet Take 1 tablet (80 mg total) by mouth daily. 90 tablet 3   Semaglutide ,0.25 or 0.5MG /DOS, (OZEMPIC , 0.25 OR 0.5 MG/DOSE,) 2 MG/3ML SOPN Inject 0.25 mg into the skin once a week. 3 mL 6   spironolactone  (ALDACTONE ) 25 MG tablet Take 1 tablet (25 mg total) by mouth at bedtime. 90 tablet 3   No current  facility-administered medications on file prior to visit.    No Known Allergies  Blood pressure 138/80, pulse 79, SpO2 99%.   Assessment/Plan:  1. Hypertension -  Essential hypertension Assessment: In office BP 150/ 80 mmHg 1st reading and 2nd reading 138/80  (goal<130/80) home BP at goal and  ABPM data demonstrated well-controlled blood pressure, with an average awake BP of 123/61 mmHg ( per Dr.Koval's notes)  Tolerates current BP meds well without any side effects Denies SOB, palpitation, chest pain, or swelling Reiterated the importance of regular exercise and low salt diet   Plan:  To reduce pill burden will combine losartan  and hydrochlorothiazide  to one pill 100/12.5 mg  Patient is in agreement with the change  Continue taking carvedilol  25 mg twice daily and spironolactone  25 mg daily in the evening  Patient to keep record of BP readings with heart rate and report to us  if BP persistently remains elevated above the goal  Follow up lab(s) : none   Thank you  Robbi Blanch, Pharm.D [facility]IR[facility]. [facility] [address], [place], [place] [address] Phone: [phone]; Fax: [phone]

## 2023-08-27 NOTE — Patient Instructions (Signed)
 Changes made by your pharmacist Robbi Blanch, PharmD at today's visit:    Instructions/Changes  (what do you need to do) Your Notes  (what you did and when you did it)  Stop taking hydrochlorothiazide  and losartan  separately and start taking combo pill losartan - hydrochlorothiazide  100/12.5 mg daily in the morning continue taking carvedilol  25 mg twice daily and spironolactone  25 mg in the evening      Follow low salt diet and continue walking 60 min 3 times per week    Incorporate some form of resistance exercise in your exercise routine     Bring all of your meds, your BP cuff and your record of home blood pressures to your next appointment.    HOW TO TAKE YOUR BLOOD PRESSURE AT HOME  Rest 5 minutes before taking your blood pressure.  Don't smoke or drink caffeinated beverages for at least 30 minutes before. Take your blood pressure before (not after) you eat. Sit comfortably with your back supported and both feet on the floor (don't cross your legs). Elevate your arm to heart level on a table or a desk. Use the proper sized cuff. It should fit smoothly and snugly around your bare upper arm. There should be enough room to slip a fingertip under the cuff. The bottom edge of the cuff should be 1 inch above the crease of the elbow. Ideally, take 3 measurements at one sitting and record the average.  Important lifestyle changes to control high blood pressure  Intervention  Effect on the BP  Lose extra pounds and watch your waistline Weight loss is one of the most effective lifestyle changes for controlling blood pressure. If you're overweight or obese, losing even a small amount of weight can help reduce blood pressure. Blood pressure might go down by about 1 millimeter of mercury (mm Hg) with each kilogram (about 2.2 pounds) of weight lost.  Exercise regularly As a general goal, aim for at least 30 minutes of moderate physical activity every day. Regular physical activity can lower high  blood pressure by about 5 to 8 mm Hg.  Eat a healthy diet Eating a diet rich in whole grains, fruits, vegetables, and low-fat dairy products and low in saturated fat and cholesterol. A healthy diet can lower high blood pressure by up to 11 mm Hg.  Reduce salt (sodium) in your diet Even a small reduction of sodium in the diet can improve heart health and reduce high blood pressure by about 5 to 6 mm Hg.  Limit alcohol One drink equals 12 ounces of beer, 5 ounces of wine, or 1.5 ounces of 80-proof liquor.  Limiting alcohol to less than one drink a day for women or two drinks a day for men can help lower blood pressure by about 4 mm Hg.   If you have any questions or concerns please use My Chart to send questions or call the office at [phone]

## 2023-08-28 ENCOUNTER — Telehealth: Payer: Self-pay

## 2023-08-28 ENCOUNTER — Other Ambulatory Visit: Payer: Self-pay

## 2023-08-28 NOTE — Telephone Encounter (Signed)
 Spoke to patient about BMP results regarding elevated creatinine, hyponatremia, and hypercalcemia. Most likely secondary to dehydration. Discussed with Dr. Delores and Dr. Koval. Not appropriate to change meds at this time. Advised patient to eat more salt and stay hydrated this weekend. I would like to see a repeat BMP next week. Patient has made a lab appointment for a BMP on Monday, July 28 at 8:30am. Patient is agreeable and verbalizes understanding.

## 2023-09-01 ENCOUNTER — Other Ambulatory Visit: Payer: Self-pay

## 2023-09-01 DIAGNOSIS — I10 Essential (primary) hypertension: Secondary | ICD-10-CM

## 2023-09-02 ENCOUNTER — Telehealth: Payer: Self-pay | Admitting: Pharmacist

## 2023-09-02 LAB — BASIC METABOLIC PANEL WITH GFR
BUN/Creatinine Ratio: 16 (ref 12–28)
BUN: 17 mg/dL (ref 8–27)
CO2: 22 mmol/L (ref 20–29)
Calcium: 10.1 mg/dL (ref 8.7–10.3)
Chloride: 99 mmol/L (ref 96–106)
Creatinine, Ser: 1.09 mg/dL — ABNORMAL HIGH (ref 0.57–1.00)
Glucose: 91 mg/dL (ref 70–99)
Potassium: 4.6 mmol/L (ref 3.5–5.2)
Sodium: 140 mmol/L (ref 134–144)
eGFR: 57 mL/min/1.73 — ABNORMAL LOW (ref >59)

## 2023-09-02 NOTE — Telephone Encounter (Signed)
 Patient contacts office to requests assistance with lab results review. She requested call back to discuss.   Labs obtained 08/26/2023 had multiple values outside of normal range and patient was requested to repeat BMET.  This was completed yesterday, 09/01/2023  I shared that 3 components of the lab tests we obtained had returned to the normal range of values:  Sodium, glucose, calcium  returned to normal values.  I also shared that her creatinine remains higher than prior to staring new medication however given hot temperatures recently, it is not surprising that creatinine values may appear slightly elevated.  (BUN has also trended up). No change in treatment plan was suggested as a result of these reassuring labs.   We agreed that additional follow-up would be with Dr. Lupie.  We agreed to an appointment within the next 4 weeks for both Blood Pressure and potentially additional lab follow-up.   Total time with patient call and documentation of interaction: 14 minutes.

## 2023-09-03 NOTE — Telephone Encounter (Signed)
 Reviewed and agree with Dr Macky Lower plan.

## 2023-09-09 ENCOUNTER — Ambulatory Visit (INDEPENDENT_AMBULATORY_CARE_PROVIDER_SITE_OTHER)

## 2023-09-09 VITALS — BP 116/77 | HR 78 | Ht 62.0 in | Wt 170.0 lb

## 2023-09-09 DIAGNOSIS — R8290 Unspecified abnormal findings in urine: Secondary | ICD-10-CM | POA: Diagnosis not present

## 2023-09-09 DIAGNOSIS — R202 Paresthesia of skin: Secondary | ICD-10-CM | POA: Diagnosis not present

## 2023-09-09 DIAGNOSIS — I10 Essential (primary) hypertension: Secondary | ICD-10-CM | POA: Diagnosis not present

## 2023-09-09 LAB — POCT URINALYSIS DIP (MANUAL ENTRY)
Bilirubin, UA: NEGATIVE
Glucose, UA: NEGATIVE mg/dL
Ketones, POC UA: NEGATIVE mg/dL
Leukocytes, UA: NEGATIVE
Nitrite, UA: NEGATIVE
Protein Ur, POC: NEGATIVE mg/dL
Spec Grav, UA: 1.015 (ref 1.010–1.025)
Urobilinogen, UA: 1 U/dL
pH, UA: 6.5 (ref 5.0–8.0)

## 2023-09-09 LAB — POCT UA - MICROSCOPIC ONLY
Bacteria, U Microscopic: NONE SEEN
WBC, Ur, HPF, POC: NONE SEEN (ref 0–5)

## 2023-09-09 NOTE — Patient Instructions (Signed)
 It was wonderful to see you today.  Please bring ALL of your medications with you to every visit.   Today we talked about:  Blood pressure - Your blood pressure is perfect today. I will look at your labs and see what your kidney numbers look like.   Hand tingling-  this is most likely due to carpal tunnel causing numbness in your hand. You can try over the counter wrist braces at night and see if this helps. We will also check your electrolytes today and vitamin b12 which if low can sometimes cause numbness and tingling.   Abnormal urine - I do not think you have an infection based on your symptoms, but I Will let you know if there is any abnormality in your urine test.   Thank you for choosing [facility].   Please call [phone] with any questions about today's appointment.   Areta Saliva, MD  Family Medicine

## 2023-09-09 NOTE — Assessment & Plan Note (Signed)
 Most likely having tingling sensation is due to carpal tunnel.  Patient's description of symptoms more likely due to nerve entrapment rather than muscle cramping.  Likely due to how patient sleeps at night.  Has history of OA in her hands however given symptoms are more significant in the morning and concerns around tingling more than pain or soreness more likely carpal tunnel. - Counseled patient to use over-the-counter wrist braces at night.  Unlikely thyroid  recent TSH was normal. - Follow-up with PCP to see if symptoms improve - Will check electrolytes with BMP and check vitamin B12.

## 2023-09-09 NOTE — Assessment & Plan Note (Signed)
 HTN well controlled at this time. Previously Cr slightly increased (though not AKI) after starting hydrochlorothiazide . However, electrolytes were wnl.  - Continue current regimen  - Recheck BMP today

## 2023-09-09 NOTE — Progress Notes (Signed)
 SUBJECTIVE:   CHIEF COMPLAINT / HPI:   Hypertension F/u  Patient was recently seen by Dr. Koval 1 week ago for ambulatory blood pressure monitoring and blood pressure management.  Ambulatory blood pressure had been within goal for the reading.  She had been started on HCTZ 12.5 at her previous visit.  She was told to continue this as her blood pressure was controlled at this point.  Patient thought to have a significant impact of whitecoat hypertension.  She recently also established with hypertension clinic with cardiology on 08/27/2023.  Patient denies any lightheadedness.  Says she was told at a previous visit that she might be dehydrated causing her to have slightly increased creatinine.  She has been trying to stay hydrated since then.  Hand Cramping/ Tingling  Patient started having hand cramps about a week ago. She started hydrochlorothiazide  around this time. She feels like her hands are going to sleep. She says it is worst in the mornings. Throughout the day it better. Says her muscles in her arms tighten up sometimes as well as in the thenar eminence.  She is worried about potential side effects of statin causing the symptoms.  Says that she does sleep on her hands when she sleeps at night.  Denies worsening of symptoms at the end of the day or throughout the day when completing more tasks.  Does not take a PPI.  Is not vegetarian.  Denies tingling or numbness anywhere else.  Denies weakness in her hands.  Abnormal Urine  Patient mentions for the last two days she has had abnormal smell in her urine and some white specks. No dysuria or abdominal pain. No fever.   PERTINENT  PMH / PSH: HTN, Mitral regurg, T2DM, OA  OBJECTIVE:   BP 116/77   Pulse 78   Ht 5' 2 (1.575 m)   Wt 170 lb (77.1 kg)   SpO2 100%   BMI 31.09 kg/m   General: well appearing, in no acute distress CV: well perfused, radial pulses 2+ bilaterally  Resp: Normal work of breathing on room air Abd: Soft, non  tender, non distended, no suprapubic or CVA tenderness  Neuro: Alert & Oriented x 4, thenar opposition strength 5/5 bilaterally, finger strength 5/5, shoulder, biceps, triceps strength 5/5, sensation grossly intact, no thenar atrophy, phalen sign negative, tinels sign at wrist and elbow negative bilaterally    ASSESSMENT/PLAN:   Assessment & Plan Essential hypertension HTN well controlled at this time. Previously Cr slightly increased (though not AKI) after starting hydrochlorothiazide . However, electrolytes were wnl.  - Continue current regimen  - Recheck BMP today  Hand tingling Most likely having tingling sensation is due to carpal tunnel.  Patient's description of symptoms more likely due to nerve entrapment rather than muscle cramping.  Likely due to how patient sleeps at night.  Has history of OA in her hands however given symptoms are more significant in the morning and concerns around tingling more than pain or soreness more likely carpal tunnel. - Counseled patient to use over-the-counter wrist braces at night.  Unlikely thyroid  recent TSH was normal. - Follow-up with PCP to see if symptoms improve - Will check electrolytes with BMP and check vitamin B12. Abnormal urine odor Most likely physiologic.  Patient does not have symptoms concerning for UTI.  Has trace intact hematuria on dipstick.  She has had this in the past multiple times however did not have microscopic hematuria in the past. - Will follow-up microscopic urine  Areta Saliva, MD [facility]

## 2023-09-11 ENCOUNTER — Ambulatory Visit: Payer: Self-pay | Admitting: Family Medicine

## 2023-09-11 DIAGNOSIS — E538 Deficiency of other specified B group vitamins: Secondary | ICD-10-CM

## 2023-09-11 MED ORDER — CYANOCOBALAMIN 500 MCG PO TABS: 500.0000 ug | ORAL_TABLET | Freq: Every day | ORAL | 0 refills | Status: DC

## 2023-09-11 NOTE — Telephone Encounter (Signed)
 Informed patient of vitamin b12 deficiency. Sent vitamins to the pharmacy. Informed to take daily for one month and will recheck.  Patient agreed without further questions.   Informed of negative blood on microscopy of urine.

## 2023-09-15 ENCOUNTER — Other Ambulatory Visit (HOSPITAL_BASED_OUTPATIENT_CLINIC_OR_DEPARTMENT_OTHER): Payer: Self-pay

## 2023-09-15 NOTE — Telephone Encounter (Signed)
 Pt is calling to check status of CMET.  It still says preliminary, advised I would send a message to Lab and Dr. Nicholas.  She would like a call back when results are available. [name], CMA

## 2023-09-16 LAB — COMPREHENSIVE METABOLIC PANEL WITH GFR
ALT: 16 IU/L (ref 0–32)
AST: 23 IU/L (ref 0–40)
Albumin: 4.3 g/dL (ref 3.9–4.9)
Alkaline Phosphatase: 88 IU/L (ref 44–121)
BUN/Creatinine Ratio: 22 (ref 12–28)
BUN: 24 mg/dL (ref 8–27)
Bilirubin Total: <0.2 mg/dL (ref 0.0–1.2)
CO2: 16 mmol/L — ABNORMAL LOW (ref 20–29)
Calcium: 9.2 mg/dL (ref 8.7–10.3)
Chloride: 97 mmol/L (ref 96–106)
Creatinine, Ser: 1.1 mg/dL — ABNORMAL HIGH (ref 0.57–1.00)
Globulin, Total: 2.4 g/dL (ref 1.5–4.5)
Glucose: 112 mg/dL — ABNORMAL HIGH (ref 70–99)
Potassium: 4.4 mmol/L (ref 3.5–5.2)
Sodium: 137 mmol/L (ref 134–144)
Total Protein: 6.7 g/dL (ref 6.0–8.5)
eGFR: 56 mL/min/1.73 — ABNORMAL LOW (ref >59)

## 2023-09-16 LAB — VITAMIN B12: Vitamin B-12: 184 pg/mL — ABNORMAL LOW (ref 232–1245)

## 2023-09-16 NOTE — Progress Notes (Signed)
 Patient was called. Informed her that her Cr was still a little bit elevated. Given her low-normal pressure in clinic, asked patient to stop losartan - hydrochlorothiazide  combo pill and just take losartan  100 mg pills that she has at home.   Informed patient we would recheck her blood pressure and BMP at next visit on 10/02/2023.

## 2023-10-02 ENCOUNTER — Other Ambulatory Visit: Payer: Self-pay

## 2023-10-02 ENCOUNTER — Telehealth: Payer: Self-pay

## 2023-10-02 ENCOUNTER — Ambulatory Visit (INDEPENDENT_AMBULATORY_CARE_PROVIDER_SITE_OTHER)

## 2023-10-02 VITALS — BP 170/84 | HR 92 | Ht 62.0 in | Wt 173.0 lb

## 2023-10-02 DIAGNOSIS — R7989 Other specified abnormal findings of blood chemistry: Secondary | ICD-10-CM | POA: Diagnosis not present

## 2023-10-02 DIAGNOSIS — I1A0 Resistant hypertension: Secondary | ICD-10-CM | POA: Diagnosis not present

## 2023-10-02 MED ORDER — OLMESARTAN MEDOXOMIL-HCTZ 40-12.5 MG PO TABS: 1.0000 | ORAL_TABLET | Freq: Every day | ORAL | 3 refills | Status: DC

## 2023-10-02 NOTE — Telephone Encounter (Signed)
 Received call from pharmacy regarding medication interaction with olmesartan -hydrochlorothiazide  and spironolactone .   These medications together can increase risk for hyperkalemia.   Pharmacist wants to ensure provider is aware of this and would like patient to continue with this therapy.   Please advise.   Chiquita JAYSON English, RN

## 2023-10-02 NOTE — Progress Notes (Signed)
    SUBJECTIVE:   CHIEF COMPLAINT / HPI:   [name] is a 63yo F who presents to the [facility] clinic for follow up of BP management. The patient has been following up closely with Dr. Koval and able to control her resistant HTN on 4 agents. Serial BMPs were indicative of worsening creatinine, and patient was advised to stop the hydrochlorothiazide  25mg  she was on and continue the 3 other medications until this visit for follow up BMP and BP check.   The patient reports headaches this week since she has been off the medication and can tell her BP is high at home. She denies any CP, SOB, dizziness, lightheadedness, pre-syncope, or any other complaints at this time. There has been some new stress at home as well with the passing of her brother in law.   PERTINENT  PMH / PSH: resistant HTN  OBJECTIVE:   BP (!) 170/84   Pulse 92   Ht 5' 2 (1.575 m)   Wt 173 lb (78.5 kg)   SpO2 100%   BMI 31.64 kg/m    General: A&O, NAD Respiratory: normal WOB GI: non-distended  Extremities: no peripheral edema. 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 Koval. Most recently able to be controlled on 4 agents for home SBPs in the 120s: losartan  100mg , hydrochlorothiazide  25mg , carvedilol  25mg  BID, and spironolactone  25mg . Hydrochlorothiazide  was stopped ~2 weeks ago with serial rising creatinine levels. With such good control on her original regimen, I would like to keep her on something similar and after discussing with Dr. Koval, I will switch her losartan -hydrochlorothiazide  to olmesartan -hydrochlorothiazide  40-12.5mg  daily. Patient is agreeable with this plan.  - Discussed DASH diet.  - Olmesartan -hydrochlorothiazide  40-12.5mg  daily sent into pharmacy  - Nurse BP check and follow up BMP in 2 weeks.  - Follow up with me in 3-4 weeks for  BP and med check.    Camie Dixons, DO [facility]

## 2023-10-02 NOTE — Patient Instructions (Addendum)
 It was wonderful to see you today.  Please bring ALL of your medications with you to every visit.   Today we talked about:  Your BP medication. Please stop taking the Losartan -hydrochlorothiazide  pills and start the new Olmesartan -hydrochlorothiazide  pills. This prescription was sent into your pharmacy today. You may start taking it tomorrow, 10/03/2023.  I would like to see you for a BP nurse visit on Mon, Sep 15th at 2pm.  Your lab appointment will be right after on Mon, Sep 15th at 2:30pm for a BMP.  See me in 1 month on Mon, Sep 22 at 2:30pm.   Thank you for choosing [facility].   Please call [phone] with any questions about today's appointment.  Please arrive at least 15 minutes prior to your scheduled appointments.   If you had blood work today, I will send you a MyChart message or a letter if results are normal. Otherwise, I will give you a call.   If you had a referral placed, they will call you to set up an appointment. Please give us  a call if you don't hear back in the next 2 weeks.   If you need additional refills before your next appointment, please call your pharmacy first.   You should follow up in our clinic in 1 month.   Camie Dixons, DO Family Medicine

## 2023-10-03 LAB — BASIC METABOLIC PANEL WITH GFR
BUN/Creatinine Ratio: 14 (ref 12–28)
BUN: 13 mg/dL (ref 8–27)
CO2: 22 mmol/L (ref 20–29)
Calcium: 10.2 mg/dL (ref 8.7–10.3)
Chloride: 100 mmol/L (ref 96–106)
Creatinine, Ser: 0.93 mg/dL (ref 0.57–1.00)
Glucose: 88 mg/dL (ref 70–99)
Potassium: 4.2 mmol/L (ref 3.5–5.2)
Sodium: 138 mmol/L (ref 134–144)
eGFR: 69 mL/min/1.73 (ref >59)

## 2023-10-03 NOTE — Telephone Encounter (Signed)
 Called pharmacy and provided with message per Dr. Lupie. They will work on filling medication and will contact patient when medication is available for pick up.  Danielle JAYSON English, RN

## 2023-10-07 ENCOUNTER — Ambulatory Visit: Payer: Self-pay

## 2023-10-12 ENCOUNTER — Other Ambulatory Visit: Payer: Self-pay | Admitting: Student

## 2023-10-12 DIAGNOSIS — I10 Essential (primary) hypertension: Secondary | ICD-10-CM

## 2023-10-16 ENCOUNTER — Other Ambulatory Visit: Payer: Self-pay

## 2023-10-16 DIAGNOSIS — I10 Essential (primary) hypertension: Secondary | ICD-10-CM

## 2023-10-17 ENCOUNTER — Ambulatory Visit

## 2023-10-17 ENCOUNTER — Telehealth: Payer: Self-pay | Admitting: *Deleted

## 2023-10-17 NOTE — Telephone Encounter (Signed)
 Left message to return call. Patient has a lab appointment Monday morning, however, we will not have lab coverage that day. Please reschedule to another day. [name] [name]

## 2023-10-20 ENCOUNTER — Ambulatory Visit

## 2023-10-20 ENCOUNTER — Other Ambulatory Visit

## 2023-10-21 ENCOUNTER — Ambulatory Visit (INDEPENDENT_AMBULATORY_CARE_PROVIDER_SITE_OTHER)

## 2023-10-21 ENCOUNTER — Other Ambulatory Visit

## 2023-10-21 VITALS — BP 142/84 | HR 78

## 2023-10-21 DIAGNOSIS — I10 Essential (primary) hypertension: Secondary | ICD-10-CM

## 2023-10-21 DIAGNOSIS — Z0130 Encounter for examination of blood pressure without abnormal findings: Secondary | ICD-10-CM

## 2023-10-21 NOTE — Progress Notes (Signed)
 Patient presents to RN clinic for BP follow up.   Last BP on 8/28 was 170/84.  She states that she last took Carvedilol  yesterday evening around 1900. She has not yet taken morning dosage. She took olmesartan - hydrochlorothiazide  this morning around 0600 and spironolactone  last night around 2300.  Patient reports that home BP reading this morning was 129/78. She reports that home BP's have been good since starting new medication.   Initial BP today at 0930 was 148/94. Repeat BP after rest and was 142/84.   Patient is asymptomatic.   Patient has PCP follow up visit on 10/27/23. Advised patient to take both carvedilol  and olmesartan -hydrochlorothiazide  prior to this appointment. She takes her spironolactone  at night before bed.   Assisted patient to lab for BMP.   Chiquita JAYSON English, RN

## 2023-10-22 LAB — BASIC METABOLIC PANEL WITH GFR
BUN/Creatinine Ratio: 14 (ref 12–28)
BUN: 14 mg/dL (ref 8–27)
CO2: 22 mmol/L (ref 20–29)
Calcium: 10.4 mg/dL — ABNORMAL HIGH (ref 8.7–10.3)
Chloride: 98 mmol/L (ref 96–106)
Creatinine, Ser: 1 mg/dL (ref 0.57–1.00)
Glucose: 104 mg/dL — ABNORMAL HIGH (ref 70–99)
Potassium: 4.6 mmol/L (ref 3.5–5.2)
Sodium: 139 mmol/L (ref 134–144)
eGFR: 63 mL/min/1.73 (ref >59)

## 2023-10-24 ENCOUNTER — Ambulatory Visit: Payer: Self-pay

## 2023-10-27 ENCOUNTER — Other Ambulatory Visit: Payer: Self-pay | Admitting: Student

## 2023-10-27 ENCOUNTER — Ambulatory Visit: Payer: Self-pay

## 2023-10-27 VITALS — BP 138/86 | HR 83 | Ht 62.0 in | Wt 171.4 lb

## 2023-10-27 DIAGNOSIS — I10 Essential (primary) hypertension: Secondary | ICD-10-CM | POA: Diagnosis not present

## 2023-10-27 DIAGNOSIS — E538 Deficiency of other specified B group vitamins: Secondary | ICD-10-CM

## 2023-10-27 DIAGNOSIS — E119 Type 2 diabetes mellitus without complications: Secondary | ICD-10-CM | POA: Diagnosis not present

## 2023-10-27 DIAGNOSIS — E118 Type 2 diabetes mellitus with unspecified complications: Secondary | ICD-10-CM | POA: Diagnosis not present

## 2023-10-27 DIAGNOSIS — G5700 Lesion of sciatic nerve, unspecified lower limb: Secondary | ICD-10-CM

## 2023-10-27 NOTE — Patient Instructions (Addendum)
 Please get your cholesterol labs done in October. Please make a lab visit appointment for the future.   It was wonderful to see you today.  Please bring ALL of your medications with you to every visit.   Thank you for choosing [facility].   Please call [phone] with any questions about today's appointment.  Please arrive at least 15 minutes prior to your scheduled appointments.   If you had blood work today, I will send you a MyChart message or a letter if results are normal. Otherwise, I will give you a call.   If you had a referral placed, they will call you to set up an appointment. Please give us  a call if you don't hear back in the next 2 weeks.   If you need additional refills before your next appointment, please call your pharmacy first.   You should follow up in our clinic as needed.   Camie Dixons, DO Family Medicine

## 2023-10-28 MED ORDER — OLMESARTAN MEDOXOMIL-HCTZ 40-12.5 MG PO TABS: 1.0000 | ORAL_TABLET | Freq: Every day | ORAL | 3 refills | Status: DC

## 2023-10-28 MED ORDER — ONETOUCH DELICA PLUS LANCET33G MISC: 0 refills | Status: AC

## 2023-10-28 MED ORDER — GLUCOSE BLOOD VI STRP
ORAL_STRIP | 12 refills | Status: AC
Start: 2023-10-28 — End: ?

## 2023-10-28 MED ORDER — EZETIMIBE 10 MG PO TABS: 10.0000 mg | ORAL_TABLET | Freq: Every day | ORAL | 3 refills | Status: AC

## 2023-10-28 MED ORDER — METFORMIN HCL ER 500 MG PO TB24: 1000.0000 mg | ORAL_TABLET | Freq: Two times a day (BID) | ORAL | 5 refills | Status: AC

## 2023-10-28 MED ORDER — CYANOCOBALAMIN 500 MCG PO TABS: 500.0000 ug | ORAL_TABLET | Freq: Every day | ORAL | 0 refills | Status: AC

## 2023-10-28 MED ORDER — CARVEDILOL 25 MG PO TABS: 25.0000 mg | ORAL_TABLET | Freq: Two times a day (BID) | ORAL | 0 refills | Status: DC

## 2023-10-28 MED ORDER — SPIRONOLACTONE 25 MG PO TABS: 25.0000 mg | ORAL_TABLET | Freq: Every day | ORAL | 3 refills | Status: DC

## 2023-10-28 MED ORDER — PRAVASTATIN SODIUM 80 MG PO TABS: 80.0000 mg | ORAL_TABLET | Freq: Every day | ORAL | 3 refills | Status: AC

## 2023-10-28 MED ORDER — OZEMPIC (0.25 OR 0.5 MG/DOSE) 2 MG/3ML ~~LOC~~ SOPN: 0.2500 mg | PEN_INJECTOR | SUBCUTANEOUS | 6 refills | Status: DC

## 2023-10-28 NOTE — Assessment & Plan Note (Signed)
 Has been difficult to control, has met multiple times with Dr. Koval who has been instrumental in medication management.  Appreciate recommendations.  The patient is doing well on 3 antihypertensives at this time: Carvedilol  25 mg twice daily, spironolactone  daily, and Benicar  daily. - Recheck lipid panel first week of October 2025. - Prescription refills sent to pharmacy on file. -See me in 6 months for your annual wellness visit or earlier if need be.

## 2023-10-28 NOTE — Progress Notes (Signed)
    SUBJECTIVE:   CHIEF COMPLAINT / HPI:   [name] is a 63 year old female who presents to the clinic for follow-up of blood pressure management.  The patient has been taking her carvedilol  25 mg twice daily, Benicar  40-12.5 mg daily, and spironolactone  25 mg daily at bedtime with significant improvement in uncontrolled blood pressures.  She brings her wrist blood pressure cuff in today to show me the latest readings in the 100s over 70s.  She denies any headache, lightheadedness, dizziness, pressure behind the eyes, vision changes, or any other complaints at this time.  PERTINENT  PMH / PSH: Uncontrolled HTN, HLD, DM  OBJECTIVE:   BP 138/86   Pulse 83   Ht 5' 2 (1.575 m)   Wt 77.7 kg   SpO2 96%   BMI 31.35 kg/m   General: A&O, NAD HEENT: No sign of trauma, EOM grossly intact Respiratory: normal WOB GI: non-distended  Extremities: no peripheral edema. Neuro: Normal gait, moves all four extremities appropriately Skin: no lesions/rashes visualized Psych: Appropriate mood and affect   ASSESSMENT/PLAN:   Assessment & Plan Essential hypertension Has been difficult to control, has met multiple times with Dr. Koval who has been instrumental in medication management.  Appreciate recommendations.  The patient is doing well on 3 antihypertensives at this time: Carvedilol  25 mg twice daily, spironolactone  daily, and Benicar  daily. - Recheck lipid panel first week of October 2025. - Prescription refills sent to pharmacy on file. -See me in 6 months for your annual wellness visit or earlier if need be.     Danielle Dixons, DO [facility]

## 2023-10-30 ENCOUNTER — Ambulatory Visit (INDEPENDENT_AMBULATORY_CARE_PROVIDER_SITE_OTHER): Admitting: Family Medicine

## 2023-10-30 VITALS — BP 122/61 | HR 73 | Ht 62.0 in | Wt 171.1 lb

## 2023-10-30 DIAGNOSIS — I1A0 Resistant hypertension: Secondary | ICD-10-CM | POA: Diagnosis not present

## 2023-10-30 NOTE — Telephone Encounter (Signed)
 Called patient.   She reports for the last two days her BP has been running low. She reports yesterday her BP was 95/49. She reports she did not take her Spironolactone  yesterday, however she took her combo pill and Carvedilol .  She reports she woke up this morning and her BP was 105/59. She reports she took her combo pill and that's all for today.   She checked her BP while on the phone with me and 116/65.  She denies any feelings of dizziness, vision changes or syncope episodes. No shortness of breath or chest pains.   Patient scheduled for this afternoon for evaluation.   Patient advised to bring all BPs and cuff to apt.   Precautions discussed in the meantime.

## 2023-10-30 NOTE — Patient Instructions (Addendum)
 It was great to see you! Thank you for allowing me to participate in your care!  Our plans for today:  - STOP taking your spirolactone. - Please keep measuring blood pressure at least 2-3 times per week. - I will see you again in 2-3 weeks to check in your blood pressure. - If you have any lighthead/dizziness, feel you're going to pass out, check your blood pressure, and let us  know what is going so we can take care of you.   Please arrive 15 minutes PRIOR to your next scheduled appointment time! If you do not, this affects OTHER patients' care.  Take care and seek immediate care sooner if you develop any concerns.   Ozell Provencal, MD, PGY-3 [facility] 2:02 PM 10/30/2023  [facility]

## 2023-10-30 NOTE — Progress Notes (Signed)
    SUBJECTIVE:   CHIEF COMPLAINT / HPI: Low blood pressure  Phone call on 09/19 with concern for low blood pressure. Had some lows 90/40s. Ongoing for 2 days. Was checking often. Was not feeling anything when it was low. No lightheaded or dizziness. Did not feel palpitations, but did see her heart rate was higher. Did not take medicine last night.  Taking Coreg , Spironolactone , and Benicar  Took Benicar  this morning and Coreg . No symptoms with standing up.  PERTINENT  PMH / PSH: Hypertension, mitral regurgitation, type 2 diabetes, history of ITP, history of breast cancer  OBJECTIVE:   BP 122/61   Pulse 73   Ht 5' 2 (1.575 m)   Wt 171 lb 2 oz (77.6 kg)   SpO2 100%   BMI 31.30 kg/m   General: NAD, well appearing Neuro: A&O Cardiovascular: RRR, no murmurs,  Respiratory: normal WOB on RA, CTAB, no wheezes, ronchi or rales Extremities: Moving all 4 extremities equally, no peripheral edema   ASSESSMENT/PLAN:   Assessment & Plan Resistant hypertension Patient on multidrug regimen with historically difficult to control blood pressure, with new low blood pressures.  She is reassuringly asymptomatic and blood pressure is in good range today.  Will titrate back blood pressure medicines slightly. STOP spironolactone  Continue Benicar  40-12.5 daily Continue Coreg  25 mg twice daily Bring in blood pressure log at next visit  Return in about 2 weeks (around 11/13/2023) for hip pain and blood pressure.  Ozell Provencal, MD [facility]

## 2023-11-05 ENCOUNTER — Other Ambulatory Visit

## 2023-11-05 DIAGNOSIS — E118 Type 2 diabetes mellitus with unspecified complications: Secondary | ICD-10-CM

## 2023-11-06 ENCOUNTER — Ambulatory Visit: Payer: Self-pay

## 2023-11-06 LAB — LIPID PANEL
Chol/HDL Ratio: 2.5 ratio (ref 0.0–4.4)
Cholesterol, Total: 162 mg/dL (ref 100–199)
HDL: 64 mg/dL (ref >39)
LDL Chol Calc (NIH): 80 mg/dL (ref 0–99)
Triglycerides: 99 mg/dL (ref 0–149)
VLDL Cholesterol Cal: 18 mg/dL (ref 5–40)

## 2023-11-07 ENCOUNTER — Telehealth: Payer: Self-pay

## 2023-11-07 ENCOUNTER — Telehealth: Payer: Self-pay | Admitting: Pharmacist

## 2023-11-07 NOTE — Telephone Encounter (Signed)
 Patient contacted for follow-up of hypertension control   Since last contact patient reports she has continued to use olmesartan /hydrochlorothiazide  daily AND carvedilol  TWICE daily.   Patient denies any significant medication related side effects.  She reports blood pressures at home which are all < 130/80  We discussed continuing current regimen (continuing to HOLD spironolactone ) and reevaluate at an office visit in a few weeks.    Total time with patient call and documentation of interaction: 9 minutes.  Follow-up visit scheduled with PCP, Dr. Lupie  10/23

## 2023-11-07 NOTE — Telephone Encounter (Signed)
 Pt requesting a c/b from her pharmacist. She didn't want to provide any information.

## 2023-11-07 NOTE — Telephone Encounter (Signed)
 Patient calls nurse line requesting to speak with Dr. Koval.   Patient has concerns about having elevated BP when she wakes up. Reports that BP will be in the 140's over 60's. Her BP will then normalize after taking her BP medications. She reports that after her meds, BP was 115/78.  Recent OV in which Dr. Alba had her stop the spironolactone  due to hypotension. Since then she has been taking her carvedilol  and Benicar  as prescribed.   She wanted to make sure that Dr. Koval was aware of this and see if he had any recommendations regarding her elevated morning blood pressures.   Patient denies symptoms with the exception of a mild headache for the last two days. Patient reports that this has now resolved.   Will forward to Dr. Koval.   Chiquita JAYSON English, RN

## 2023-11-10 NOTE — Telephone Encounter (Signed)
 Reviewed and agree with Dr Rennis plan.

## 2023-11-14 NOTE — Telephone Encounter (Signed)
 Call back, N/A LVM to call back on [phone]

## 2023-11-20 NOTE — Telephone Encounter (Signed)
 Spoke to pt. Was concern about her elevated BP, pt was able to reach out to her PCP. Issue resolved.

## 2023-11-27 ENCOUNTER — Ambulatory Visit (INDEPENDENT_AMBULATORY_CARE_PROVIDER_SITE_OTHER)

## 2023-11-27 VITALS — BP 168/98 | HR 78 | Wt 173.0 lb

## 2023-11-27 DIAGNOSIS — E538 Deficiency of other specified B group vitamins: Secondary | ICD-10-CM

## 2023-11-27 DIAGNOSIS — R252 Cramp and spasm: Secondary | ICD-10-CM | POA: Diagnosis not present

## 2023-11-27 DIAGNOSIS — Z23 Encounter for immunization: Secondary | ICD-10-CM | POA: Diagnosis not present

## 2023-11-27 DIAGNOSIS — I1A0 Resistant hypertension: Secondary | ICD-10-CM

## 2023-11-27 DIAGNOSIS — M5431 Sciatica, right side: Secondary | ICD-10-CM | POA: Diagnosis not present

## 2023-11-27 DIAGNOSIS — E210 Primary hyperparathyroidism: Secondary | ICD-10-CM

## 2023-11-27 DIAGNOSIS — F5101 Primary insomnia: Secondary | ICD-10-CM

## 2023-11-27 NOTE — Progress Notes (Signed)
    SUBJECTIVE:   CHIEF COMPLAINT / HPI:   Rosaly is a 63yo F who presents to the clinic for follow up of BP management. Patient states she has been tracking her BP at home and is getting 130s systolic with her wrist cuff. She denies any new life changes or stress, but is very anxious about her BP in the office.   R buttock pain Patient also c/o R buttock pain that radiates down her leg for a few weeks now. Denies back pain, saddle anesthesia, urinary or bowel incontinence.   Muscle cramps Diffuse muscle cramps more noticeable in the last month, thinks it could be her dose of pravastatin  which she has been taking for years.   PERTINENT  PMH / PSH: resistant HTN, insomnia  OBJECTIVE:   BP (!) 168/98   Pulse 78   Wt 173 lb (78.5 kg)   SpO2 98%   BMI 31.64 kg/m    General: A&O, NAD HEENT: No sign of trauma, EOM grossly intact, non-icteric sclera Respiratory: normal WOB GI: non-distended  Extremities: no peripheral edema. Neuro: Normal gait, moves all four extremities appropriately Skin: no lesions/rashes visualized Psych: Appropriate mood and affect   ASSESSMENT/PLAN:   Assessment & Plan Resistant hypertension With home SBP in 130s with discontinuation of spironolactone , will continue current regimen of coreg  and Benicar . Advised patient to take BP at home at the same time, same place, every day with arm at heart level with her arm cuff, not wrist cuff and to MyChart me her BP readings weekly. I am concerned for OSA as well, her partner has told her she snores loudly.  - Referral to sleep center sent - Follow up with me in 1 month Muscle cramps Hypercalcemia B12 deficiency Most likely not a side effect of the statin as she has been on it for years and has not had these symptoms before. B-12 deficiency and mild hypercalcemia could be factors. Patient states she has been taking OTC Vitamin B-12 and has had a parathyroidectomy in 2003 for hypoparathyroidism.  - Labs today:  Vitamin B-12, PTH, calcium  Sciatica of right side Classic symptoms of sciatica with history and patient points to area of piriformis on R buttock as primary pain site. - Sciatica stretches handout given  - Referral to PT sent Primary insomnia She is not sleeping well, sleeping late and waking early. Last sleep study 2017 indicative of insomnia, no OSA.  - Discussed proper sleep hygiene at length - Referral to sleep center as mentioned above Encounter for immunization Flu vaccine administered with patient consent.   Camie Dixons, DO [facility]

## 2023-11-27 NOTE — Patient Instructions (Addendum)
 It was wonderful to see you today.  Please bring ALL of your medications with you to every visit.   Today we talked about:  Your high blood pressure.  Because you are having such good blood pressure readings at home I would not change your regimen at this time.  Continue to check your blood pressure in the same place at the same time every single day with your feet flat on the floor and your arm at heart level after you have calm down for at least 5 minutes.  Eat a healthy, low-sodium high-fiber diet.  Send me a copy of your home blood pressures for the next 2 weeks through MyChart.  I am also concerned you may have sleep apnea that is causing your high blood pressure.  I have referred you to the sleep center where they will do another sleep study but can also help you with your insomnia.  Your right leg sciatica.  Red flag symptoms to watch out for are if you are becoming incontinent which means peeing or pooping on yourself without realizing it, or you are having saddle anesthesia which means your perianal area has gone numb.  These are symptoms that need to be addressed in the ED.  For now practice the stretches that I have included in this packet, and you have been referred to PT.  Please watch out for that phone call for an appointment.  Take 1000 mg of Tylenol  1 hour before your PT appointment.  We are getting some blood work today.  I do not think the cause of your muscle cramping would be your cholesterol medication as you have been on it for years and it has not caused cramping before.  This could be due to vitamin deficiency.  We are checking a vitamin B12, PTH, and calcium  level today.  I will contact you with the results.  Thank you for choosing [facility].   Please call [phone] with any questions about today's appointment.  Please arrive at least 15 minutes prior to your scheduled appointments.   If you had blood work today, I will send you a MyChart message or  a letter if results are normal. Otherwise, I will give you a call.   If you had a referral placed, they will call you to set up an appointment. Please give us  a call if you don't hear back in the next 2 weeks.   If you need additional refills before your next appointment, please call your pharmacy first.   You should follow up in our clinic in 1 month on   Camie Dixons, DO Family Medicine

## 2023-11-29 LAB — PTH, INTACT AND CALCIUM
Calcium: 9.8 mg/dL (ref 8.7–10.3)
PTH: 54 pg/mL (ref 15–65)

## 2023-11-29 LAB — VITAMIN B12: Vitamin B-12: 616 pg/mL (ref 232–1245)

## 2023-11-30 ENCOUNTER — Other Ambulatory Visit: Payer: Self-pay

## 2023-11-30 ENCOUNTER — Emergency Department (HOSPITAL_COMMUNITY): Admission: EM | Admit: 2023-11-30 | Discharge: 2023-11-30 | Disposition: A | Discharge status: Home / Self Care

## 2023-11-30 ENCOUNTER — Encounter (HOSPITAL_COMMUNITY): Payer: Self-pay | Admitting: Emergency Medicine

## 2023-11-30 DIAGNOSIS — Z79899 Other long term (current) drug therapy: Secondary | ICD-10-CM | POA: Diagnosis not present

## 2023-11-30 DIAGNOSIS — E119 Type 2 diabetes mellitus without complications: Secondary | ICD-10-CM | POA: Diagnosis not present

## 2023-11-30 DIAGNOSIS — I10 Essential (primary) hypertension: Secondary | ICD-10-CM | POA: Insufficient documentation

## 2023-11-30 DIAGNOSIS — R030 Elevated blood-pressure reading, without diagnosis of hypertension: Secondary | ICD-10-CM

## 2023-11-30 DIAGNOSIS — Z853 Personal history of malignant neoplasm of breast: Secondary | ICD-10-CM | POA: Insufficient documentation

## 2023-11-30 DIAGNOSIS — Z7984 Long term (current) use of oral hypoglycemic drugs: Secondary | ICD-10-CM | POA: Insufficient documentation

## 2023-11-30 DIAGNOSIS — R519 Headache, unspecified: Secondary | ICD-10-CM | POA: Diagnosis present

## 2023-11-30 LAB — CBC
HCT: 39.4 % (ref 36.0–46.0)
Hemoglobin: 12.4 g/dL (ref 12.0–15.0)
MCH: 24.8 pg — ABNORMAL LOW (ref 26.0–34.0)
MCHC: 31.5 g/dL (ref 30.0–36.0)
MCV: 78.8 fL — ABNORMAL LOW (ref 80.0–100.0)
Platelets: 106 K/uL — ABNORMAL LOW (ref 150–400)
RBC: 5 MIL/uL (ref 3.87–5.11)
RDW: 14.4 % (ref 11.5–15.5)
WBC: 7.8 K/uL (ref 4.0–10.5)
nRBC: 0 % (ref 0.0–0.2)

## 2023-11-30 LAB — COMPREHENSIVE METABOLIC PANEL WITH GFR
ALT: 14 U/L (ref 0–44)
AST: 16 U/L (ref 15–41)
Albumin: 3.6 g/dL (ref 3.5–5.0)
Alkaline Phosphatase: 42 U/L (ref 38–126)
Anion gap: 11 (ref 5–15)
BUN: 11 mg/dL (ref 8–23)
CO2: 25 mmol/L (ref 22–32)
Calcium: 9.6 mg/dL (ref 8.9–10.3)
Chloride: 100 mmol/L (ref 98–111)
Creatinine, Ser: 0.97 mg/dL (ref 0.44–1.00)
GFR, Estimated: >60 mL/min (ref >60)
Glucose, Bld: 123 mg/dL — ABNORMAL HIGH (ref 70–99)
Potassium: 4.1 mmol/L (ref 3.5–5.1)
Sodium: 136 mmol/L (ref 135–145)
Total Bilirubin: 0.7 mg/dL (ref 0.0–1.2)
Total Protein: 7.3 g/dL (ref 6.5–8.1)

## 2023-11-30 LAB — TROPONIN I (HIGH SENSITIVITY): Troponin I (High Sensitivity): 3 ng/L (ref <18)

## 2023-11-30 NOTE — Discharge Instructions (Addendum)
 You were evaluated in the emergency room for elevated blood pressure.  Your lab work and EKG did not show any significant abnormality.  Please follow with your primary care doctor for further evaluation and management of your blood pressure.

## 2023-11-30 NOTE — ED Triage Notes (Signed)
 Pt here from home with c/o htn and slight  headaches , pt has no complaints  of chest pain

## 2023-11-30 NOTE — ED Provider Notes (Signed)
 [place] EMERGENCY DEPARTMENT AT [facility] Provider Note   CSN: [id number] Arrival date & time: 11/30/23  1040     Patient presents with: Hypertension   [name] is a 63 y.o. female with history of hypertension presents with complaints of elevated blood pressure readings.  Patient states that she has been working with her primary care doctor to better control her blood pressure.  Recently discontinued one of her blood pressure medications.  States that she took her blood pressure at home and it was 158 and 160 systolic.  Patient endorses a mild posterior headache, otherwise it is a symptomatic.    Hypertension      Past Medical History:  Diagnosis Date   Arthritis    Breast CA (HCC)    breast CA- right breast   Diabetes mellitus    Hyperlipidemia    Hypertension    Thrombocytopenia    Thyroid  disease    parathyroid  removed   Past Surgical History:  Procedure Laterality Date   ABDOMINAL HYSTERECTOMY     1996 per pt   BREAST BIOPSY Right 06/2009   BREAST SURGERY  09/12/2010   excision of skin mass - right breast, had another surgery to remove more Cancer cells   COLONOSCOPY     INNER EAR SURGERY  06/01/2013   MASTECTOMY Right 07/21/2009   PARATHYROIDECTOMY  2012   Right inferior parathyroid  gland adenoma excision   REDUCTION MAMMAPLASTY Left 2013     Prior to Admission medications   Medication Sig Start Date End Date Taking? Authorizing Provider  carvedilol  (COREG ) 25 MG tablet Take 1 tablet (25 mg total) by mouth 2 (two) times daily with a meal. 10/28/23   Majeed, Camie, DO  cyanocobalamin  (VITAMIN B12) 500 MCG tablet Take 1 tablet (500 mcg total) by mouth daily. 10/28/23   Majeed, Camie, DO  ezetimibe  (ZETIA ) 10 MG tablet Take 1 tablet (10 mg total) by mouth daily. 10/28/23   Majeed, Camie, DO  glucose blood test strip Use as instructed - daily testing 10/28/23   Lupie Camie, DO  Lancet Devices (ONE TOUCH DELICA LANCING DEV) MISC Please use to  check blood sugar once per day. E11.9 05/29/20   Cresenzo, John V, MD  Lancets [facility] DELICA PLUS [place]) MISC USE  1 TO CHECK GLUCOSE ONCE DAILY 10/28/23   Majeed, Camie, DO  metFORMIN  (GLUCOPHAGE -XR) 500 MG 24 hr tablet Take 2 tablets (1,000 mg total) by mouth 2 (two) times daily with a meal. 10/28/23   Majeed, Camie, DO  olmesartan -hydrochlorothiazide  (BENICAR  HCT) 40-12.5 MG tablet Take 1 tablet by mouth daily. 10/28/23   Majeed, Camie, DO  pravastatin  (PRAVACHOL ) 80 MG tablet Take 1 tablet (80 mg total) by mouth daily. 10/28/23   Majeed, Camie, DO  Semaglutide ,0.25 or 0.5MG /DOS, (OZEMPIC , 0.25 OR 0.5 MG/DOSE,) 2 MG/3ML SOPN Inject 0.25 mg into the skin once a week. 10/28/23   Lupie Camie, DO    Allergies: Patient has no known allergies.    Review of Systems  All other systems reviewed and are negative.   Updated Vital Signs BP (!) 156/82   Pulse 79   Temp 97.8 F (36.6 C) (Oral)   Resp 16   SpO2 100%   Physical Exam Vitals and nursing note reviewed.  Constitutional:      General: She is not in acute distress.    Appearance: She is well-developed.  HENT:     Head: Normocephalic and atraumatic.  Eyes:     Conjunctiva/sclera: Conjunctivae normal.  Cardiovascular:     Rate and Rhythm: Normal rate and regular rhythm.     Heart sounds: No murmur heard. Pulmonary:     Effort: Pulmonary effort is normal. No respiratory distress.     Breath sounds: Normal breath sounds.  Abdominal:     Palpations: Abdomen is soft.     Tenderness: There is no abdominal tenderness.  Musculoskeletal:        General: No swelling.     Cervical back: Neck supple.  Skin:    General: Skin is warm and dry.     Capillary Refill: Capillary refill takes less than 2 seconds.  Neurological:     Mental Status: She is alert.     Comments: Patient is alert and oriented. There is no abnormal phonation. Symmetric smile without facial droop. Moves all extremities spontaneously. 5/5 strength in upper and lower  extremities. . No sensation deficit. There is no nystagmus. EOMI, PERRL. Coordination intact with finger to nose and normal ambulation.    Psychiatric:        Mood and Affect: Mood normal.     (all labs ordered are listed, but only abnormal results are displayed) Labs Reviewed  CBC - Abnormal; Notable for the following components:      Result Value   MCV 78.8 (*)    MCH 24.8 (*)    Platelets 106 (*)    All other components within normal limits  COMPREHENSIVE METABOLIC PANEL WITH GFR  TROPONIN I (HIGH SENSITIVITY)    EKG: None  Radiology: No results found.   Procedures   Medications Ordered in the ED - No data to display  Clinical Course as of 11/30/23 1213  Sun Nov 30, 2023  1159 Patient with history of hypertension and evaluated for complaints of elevated blood pressure in the setting of outpatient medication adjustments.  Upon arrival patient is hypertensive to 156/82.  She is currently asymptomatic.  No neurodeficits on exam.  Lab work was ordered in triage, will await results as it is currently in process. [JT]  1200 CBC(!) No significant abnormality [JT]  1211 Troponin I (High Sensitivity) Within normal limits [JT]  1211 Comprehensive metabolic panel(!) No acute finding [JT]  1212 Workup overall reassuring.  Patient be discharged home encouraged to follow-up with PCP. [JT]    Clinical Course User Index [JT] Donnajean Lynwood DEL, PA-C                                 Medical Decision Making Amount and/or Complexity of Data Reviewed Labs: ordered.   This patient presents to the ED with chief complaint(s) of hypertension.  The complaint involves an extensive differential diagnosis and also carries with it a high risk of complications and morbidity.   Pertinent past medical history as listed in HPI  The differential diagnosis includes  Do not suspect ACS as patient is without any chest pain, EKG changes or troponin elevation.  Do not suspect CVA or TIA based off  exam and history.  Additional history obtained: Records reviewed Care Everywhere/External Records  Disposition:   Patient will be discharged home. The patient has been appropriately medically screened and/or stabilized in the ED. I have low suspicion for any other emergent medical condition which would require further screening, evaluation or treatment in the ED or require inpatient management. At time of discharge the patient is hemodynamically stable and in no acute distress. I have discussed work-up results and  diagnosis with patient and answered all questions. Patient is agreeable with discharge plan. We discussed strict return precautions for returning to the emergency department and they verbalized understanding.     Social Determinants of Health:   none  This note was dictated with voice recognition software.  Despite best efforts at proofreading, errors may have occurred which can change the documentation meaning.       Final diagnoses:  Elevated blood pressure reading    ED Discharge Orders     None          Donnajean Lynwood VEAR DEVONNA 11/30/23 1214    Ula Prentice SAUNDERS, MD 11/30/23 [phone]

## 2023-12-01 ENCOUNTER — Ambulatory Visit: Payer: Self-pay

## 2023-12-01 ENCOUNTER — Ambulatory Visit: Payer: Self-pay | Admitting: Family Medicine

## 2023-12-01 ENCOUNTER — Telehealth: Payer: Self-pay

## 2023-12-01 ENCOUNTER — Ambulatory Visit: Admitting: Family Medicine

## 2023-12-01 ENCOUNTER — Encounter: Payer: Self-pay | Admitting: Family Medicine

## 2023-12-01 VITALS — BP 150/72 | HR 78 | Ht 62.0 in | Wt 171.4 lb

## 2023-12-01 DIAGNOSIS — I1A0 Resistant hypertension: Secondary | ICD-10-CM | POA: Diagnosis not present

## 2023-12-01 DIAGNOSIS — R0981 Nasal congestion: Secondary | ICD-10-CM | POA: Diagnosis not present

## 2023-12-01 LAB — POC SOFIA 2 FLU + SARS ANTIGEN FIA
Influenza A, POC: NEGATIVE
Influenza B, POC: NEGATIVE
SARS Coronavirus 2 Ag: NEGATIVE

## 2023-12-01 MED ORDER — OLMESARTAN MEDOXOMIL-HCTZ 40-25 MG PO TABS: 1.0000 | ORAL_TABLET | Freq: Every day | ORAL | 3 refills | Status: AC

## 2023-12-01 NOTE — Progress Notes (Signed)
    SUBJECTIVE:   CHIEF COMPLAINT / HPI:       Discussed the use of AI scribe software for clinical note transcription with the patient, who gave verbal consent to proceed.  History of Present Illness [name] is a 63 year old female with hypertension who presents with elevated blood pressure readings.   Here for HTN Called earlier today reporting that her BP has been running 170s over 70s for the past week.  She went to the ED who recommended PCP follow-up.  She has been having a headache. She is currently on Coreg  25 mg twice daily and Benicar -HCT 40-12.5 daily.  She was recently referred to get a sleep study done.  Hypertension - Elevated blood pressure readings at home, in the 160s systolic - Current antihypertensive regimen includes carvedilol , olmesartan , and Benicar  HCT - visited ER as above -Endorses recent headache due to nasal congestion/upper respiratory symptoms but denies any visual changes, chest pain, shortness of breath, weakness or numbness in her extremities  Cephalalgia and upper respiratory symptoms - Headache and rhinorrhea, nasal congestion present for the past three days - No fever, vision changes, chest pain, or dyspnea - Recent administration of influenza vaccine     PERTINENT  PMH / PSH: HTN, diabetes, ITP  OBJECTIVE:   BP (!) 150/72   Pulse 78   Ht 5' 2 (1.575 m)   Wt 171 lb 6.4 oz (77.7 kg)   SpO2 97%   BMI 31.35 kg/m    General: NAD, pleasant, able to participate in exam Cardiac: RRR, no murmurs auscultated Respiratory: CTAB, normal WOB Abdomen: soft, non-tender, non-distended, normoactive bowel sounds Extremities: warm and well perfused, no edema or cyanosis Skin: warm and dry, no rashes noted Neuro: alert, no obvious focal deficits, speech normal.  Cranial nerves II through XII intact without focal deficits Psych: Normal affect and mood  ASSESSMENT/PLAN:    Assessment & Plan Resistant hypertension On Coreg ,  Benicar -HCT Uncontrolled Increase Benicar  HCT to 40-25.  Continue Coreg  Follow-up in 2 weeks with BP log Nasal congestion Suspect viral URI, <5 days symptoms  Flu/covid swab negative Discussed return precautions and supportive care   Danielle Coward, MD [facility]

## 2023-12-01 NOTE — Telephone Encounter (Signed)
 Patient calls nurse line reporting continued high blood pressure.   She reports her BP has been running 170s/78 since she left the office last week.  She reports she started having a headache, which prompted her to go to the ED over the weekend.   She reports they advised her to contact her PCP for medication management.   She reports she has not checked her BP this morning, however reports a headache.   She denies any vision changes, dizziness, chest pains or shortness of breath.   Patient scheduled for this morning for evaluation.

## 2023-12-01 NOTE — Patient Instructions (Addendum)
 Please start taking olmesartan -HCTZ 40-25 mg daily.  I have sent this into your pharmacy.  Stop your previous dose.  Please keep track of your blood pressure at home.  Follow-up in 2 weeks  If any of your results from today are abnormal and/or require changes to your medical care, I will give you a call. Otherwise, I will send you a letter in the mail or a message on MyChart.

## 2023-12-02 ENCOUNTER — Telehealth: Payer: Self-pay | Admitting: Cardiology

## 2023-12-02 NOTE — Telephone Encounter (Signed)
 Pt c/o BP issue: STAT if pt c/o blurred vision, one-sided weakness or slurred speech.  STAT if BP is GREATER than 180/120 TODAY.  STAT if BP is LESS than 90/60 and SYMPTOMATIC TODAY  1. What is your BP concern?   Patient stated her BP has been elevated  2. Have you taken any BP medication today?  Yes  3. What are your last 5 BP readings?  168/78  HR 93  4. Are you having any other symptoms (ex. Dizziness, headache, blurred vision, passed out)?   A little headache  Patient stated she wants a call back from her pharmacist regarding her BP medication.

## 2023-12-03 NOTE — Telephone Encounter (Signed)
 Spoke to pt, reports her losartan  -hydrochlorothiazide  combo was changed to olmesartan  - hydrochlorothiazide  combo. Advised to take BP 2 hours after taking BP meds and report in 2 weeks. Plan to add amlodipine  5 mg if BP remain elevated

## 2023-12-13 ENCOUNTER — Other Ambulatory Visit: Payer: Self-pay

## 2023-12-13 DIAGNOSIS — I10 Essential (primary) hypertension: Secondary | ICD-10-CM

## 2023-12-15 ENCOUNTER — Other Ambulatory Visit: Payer: Self-pay

## 2023-12-15 DIAGNOSIS — I10 Essential (primary) hypertension: Secondary | ICD-10-CM

## 2023-12-15 NOTE — Telephone Encounter (Signed)
 Jon request refill of:  Name of Medication(s):  Dania Last date of OV:  12/01/2023 Pharmacy:  [facility] [address], [place]  Will route refill request to Team CMA.  Discussed with patient policy to call pharmacy for future refills.  Also, discussed refills may take up to 48 hours to approve or deny.  Chiquita POUR [place]

## 2023-12-17 MED ORDER — CARVEDILOL 25 MG PO TABS: 25.0000 mg | ORAL_TABLET | Freq: Two times a day (BID) | ORAL | 0 refills | Status: DC

## 2023-12-24 LAB — OPHTHALMOLOGY REPORT-SCANNED

## 2023-12-30 ENCOUNTER — Encounter (HOSPITAL_BASED_OUTPATIENT_CLINIC_OR_DEPARTMENT_OTHER): Payer: Self-pay

## 2023-12-30 ENCOUNTER — Ambulatory Visit (INDEPENDENT_AMBULATORY_CARE_PROVIDER_SITE_OTHER)

## 2023-12-30 VITALS — BP 154/68 | HR 68 | Ht 62.0 in | Wt 172.0 lb

## 2023-12-30 DIAGNOSIS — G4719 Other hypersomnia: Secondary | ICD-10-CM | POA: Diagnosis not present

## 2023-12-30 NOTE — Progress Notes (Signed)
Epworth Sleepiness Scale  Use the following scale to choose the most appropriate number for each situation. 0 Would never nod off 1  Slight  chance of nodding off 2 Moderate chance of nodding off 3 High chance of nodding off  Sitting and reading: 0 Watching TV: 0 Sitting, inactive, in a public place (e.g., in a meeting, theater, or dinner event): 0 As a passenger in a car for an hour or more without stopping for a break: 0 Lying down to rest when circumstances permit:0 Sitting and talking to someone: 0 Sitting quietly after a meal without alcohol: 0 In a car, while stopped for a few  minutes in traffic or at a light: 0  TOTOAL: 0

## 2023-12-30 NOTE — Patient Instructions (Signed)
 Complete home sleep test; ordered today.  Follow sleep hygiene as discussed.  Follow up in 6-8 weeks for study results review.

## 2023-12-30 NOTE — Progress Notes (Signed)
 @Patient  ID: [name], female    DOB: 07-29-1960, 63 y.o.   MRN: [id number]  Chief Complaint  Patient presents with   Establish Care    New sleep     Referring provider: Delores Suzann HERO, MD  HPI: Discussed the use of AI scribe software for clinical note transcription with the patient, who gave verbal consent to proceed.  History of Present Illness [name] is a 63 year old female with PMH of sleep apnea diagnosed in 2017 who presents with sleep disturbances and concerns about blood pressure. She was referred by her PCP Dr. Lupie for evaluation of sleep disturbances potentially related to blood pressure issues.  She experiences difficulty falling asleep, typically going to bed around 11 PM, and wakes up multiple times during the night, primarily to use the bathroom. She attributes this to her blood pressure medication, Benicar , which contains hydrochlorothiazide . She occasionally wakes up startled and short of breath. She usually wakes up around 5 AM and finds it difficult to return to sleep.  A split night sleep study was completed in 2017, but she did not start CPAP after the study. She has not been referred for surgery or a mouthpiece, although a dentist suggested a mouthguard for her teeth, which she has not used consistently. She is unsure if she snores; she does not live with anyone.  She has experienced weight fluctuations over the past few years, including weight gain. She is treated for high blood pressure, high cholesterol, diabetes, and has a history of breast cancer and low platelets. She is currently taking carvedilol , Benicar , and metformin , which she reports makes her drowsy when taken at night. She was prescribed Ozempic  for weight loss and diabetes management but has not taken it recently due to concerns about its effect on her blood pressure.  No family history of sleep apnea, although her sister is undergoing testing for it. She experiences headaches,  which she attributes to sinus issues, but acknowledges they could be related to her blood pressure. She monitors her blood pressure regularly and notes it is often elevated during medical visits, attributing this to 'white coat syndrome'. No chest pain, irregular heartbeats, or shortness of breath during activities.  She experiences some swelling in her feet and ankles, which worsens by the end of the day but improves with elevation.     TEST/EVENTS :   09/19/2015 Split night study:  did not meet split night criteria and transitioned to NPSG with RDI of 12.9  No Known Allergies  Immunization History  Administered Date(s) Administered   Influenza Split 11/09/2010, 11/12/2011   Influenza, Seasonal, Injecte, Preservative Fre 10/24/2022, 11/27/2023   Influenza,inj,Quad PF,6+ Mos 11/13/2012, 11/17/2015, 11/28/2016, 12/02/2017, 11/13/2018, 11/30/2019, 12/22/2020, 11/06/2021   Influenza-Unspecified 11/04/2013, 11/13/2014   PFIZER Comirnaty(Gray Top)Covid-19 Tri-Sucrose Vaccine 12/04/2021   PFIZER(Purple Top)SARS-COV-2 Vaccination 04/20/2019, 05/11/2019, 12/15/2019   PNEUMOCOCCAL CONJUGATE-20 09/19/2020   PPD Test 01/16/2011, 06/27/2015   Pfizer(Comirnaty)Fall Seasonal Vaccine 12 years and older 12/04/2021   Pneumococcal Polysaccharide-23 12/06/1998, 11/13/2012   Td 02/04/1994   Tdap 11/13/2012    Past Medical History:  Diagnosis Date   Arthritis    Breast CA (HCC)    breast CA- right breast   Diabetes mellitus    Hyperlipidemia    Hypertension    Thrombocytopenia    Thyroid  disease    parathyroid  removed    Tobacco History: Social History   Tobacco Use  Smoking Status Never   Passive exposure: Past  Smokeless Tobacco Never  Counseling given: Not Answered   Outpatient Medications Prior to Visit  Medication Sig Dispense Refill   carvedilol  (COREG ) 25 MG tablet Take 1 tablet (25 mg total) by mouth 2 (two) times daily with a meal. 60 tablet 0   cyanocobalamin  (VITAMIN  B12) 500 MCG tablet Take 1 tablet (500 mcg total) by mouth daily. 30 tablet 0   ezetimibe  (ZETIA ) 10 MG tablet Take 1 tablet (10 mg total) by mouth daily. 90 tablet 3   glucose blood test strip Use as instructed - daily testing 350 each 12   Lancet Devices (ONE TOUCH DELICA LANCING DEV) MISC Please use to check blood sugar once per day. E11.9 1 each 0   Lancets (ONETOUCH DELICA PLUS LANCET33G) MISC USE  1 TO CHECK GLUCOSE ONCE DAILY 100 each 0   metFORMIN  (GLUCOPHAGE -XR) 500 MG 24 hr tablet Take 2 tablets (1,000 mg total) by mouth 2 (two) times daily with a meal. 120 tablet 5   olmesartan -hydrochlorothiazide  (BENICAR  HCT) 40-25 MG tablet Take 1 tablet by mouth daily. 30 tablet 3   pravastatin  (PRAVACHOL ) 80 MG tablet Take 1 tablet (80 mg total) by mouth daily. 90 tablet 3   Semaglutide ,0.25 or 0.5MG /DOS, (OZEMPIC , 0.25 OR 0.5 MG/DOSE,) 2 MG/3ML SOPN Inject 0.25 mg into the skin once a week. 3 mL 6   No facility-administered medications prior to visit.     Review of Systems: as per HPI  Constitutional:   No  weight loss, night sweats,  Fevers, chills, fatigue, or  lassitude.  HEENT:   No headaches,  Difficulty swallowing,  Tooth/dental problems, or  Sore throat,                No sneezing, itching, ear ache, nasal congestion, post nasal drip,   CV:  No chest pain,  Orthopnea, PND, swelling in lower extremities, anasarca, dizziness, palpitations, syncope.   GI  No heartburn, indigestion, abdominal pain, nausea, vomiting, diarrhea, change in bowel habits, loss of appetite, bloody stools.   Resp: No shortness of breath with exertion or at rest.  No excess mucus, no productive cough,  No non-productive cough,  No coughing up of blood.  No change in color of mucus.  No wheezing.  No chest wall deformity  Skin: no rash or lesions.  GU: no dysuria, change in color of urine, no urgency or frequency.  No flank pain, no hematuria   MS:  No joint pain or swelling.  No decreased range of motion.   No back pain.    Physical Exam  BP (!) 154/68   Pulse 68   Ht 5' 2 (1.575 m)   Wt 172 lb (78 kg)   SpO2 96%   BMI 31.46 kg/m   GEN: A/Ox3; pleasant , NAD, well nourished    HEENT:  [place]/AT,  EACs-clear, TMs-wnl, NOSE-clear, THROAT-clear, no lesions, no postnasal drip or exudate noted. Mallampati 4  NECK:  Supple w/ fair ROM; no JVD; normal carotid impulses w/o bruits; no thyromegaly or nodules palpated; no lymphadenopathy.    RESP  Clear  P & A; w/o, wheezes/ rales/ or rhonchi. no accessory muscle use, no dullness to percussion  CARD:  RRR, no m/r/g, no peripheral edema, pulses intact, no cyanosis or clubbing.  GI:   Soft & nt; nml bowel sounds; no organomegaly or masses detected.   Musco: Warm bil, no deformities or joint swelling noted.   Neuro: alert, no focal deficits noted.    Skin: Warm, no lesions or rashes  Lab Results:  CBC    Component Value Date/Time   WBC 7.8 11/30/2023 1054   RBC 5.00 11/30/2023 1054   HGB 12.4 11/30/2023 1054   HGB 13.6 02/27/2023 1030   HGB 12.6 11/22/2016 0900   HCT 39.4 11/30/2023 1054   HCT 41.3 02/27/2023 1030   HCT 40.0 11/22/2016 0900   PLT 106 (L) 11/30/2023 1054   PLT 101 (L) 02/27/2023 1030   MCV 78.8 (L) 11/30/2023 1054   MCV 76 (L) 02/27/2023 1030   MCV 79.1 (L) 11/22/2016 0900   MCH 24.8 (L) 11/30/2023 1054   MCHC 31.5 11/30/2023 1054   RDW 14.4 11/30/2023 1054   RDW 13.7 02/27/2023 1030   RDW 15.0 (H) 11/22/2016 0900   LYMPHSABS 3.4 03/02/2023 1553   LYMPHSABS 3.5 (H) 05/13/2022 1715   LYMPHSABS 3.2 11/22/2016 0900   MONOABS 0.4 03/02/2023 1553   MONOABS 0.5 11/22/2016 0900   EOSABS 0.2 03/02/2023 1553   EOSABS 0.1 05/13/2022 1715   BASOSABS 0.0 03/02/2023 1553   BASOSABS 0.1 05/13/2022 1715   BASOSABS 0.0 11/22/2016 0900    BMET    Component Value Date/Time   NA 136 11/30/2023 1054   NA 139 10/21/2023 1159   NA 140 11/22/2016 0900   K 4.1 11/30/2023 1054   K 4.5 11/22/2016 0900   CL 100  11/30/2023 1054   CL 105 02/21/2012 0806   CO2 25 11/30/2023 1054   CO2 28 11/22/2016 0900   GLUCOSE 123 (H) 11/30/2023 1054   GLUCOSE 109 11/22/2016 0900   GLUCOSE 135 (H) 02/21/2012 0806   BUN 11 11/30/2023 1054   BUN 14 10/21/2023 1159   BUN 13.6 11/22/2016 0900   CREATININE 0.97 11/30/2023 1054   CREATININE 0.97 11/24/2017 0805   CREATININE 0.9 11/22/2016 0900   CALCIUM  9.6 11/30/2023 1054   CALCIUM  9.9 11/22/2016 0900   GFRNONAA >60 11/30/2023 1054   GFRNONAA >60 11/24/2017 0805   GFRNONAA 67 08/15/2015 1342   GFRAA 87 01/14/2020 1003   GFRAA >60 11/24/2017 0805   GFRAA 77 08/15/2015 1342    BNP    Component Value Date/Time   BNP 7.4 12/05/2017 1352   BNP 12.6 04/04/2011 0910    ProBNP No results found for: PROBNP  Imaging: No results found.  Administration History     None           No data to display          No results found for: NITRICOXIDE   Assessment & Plan:   Assessment & Plan Excessive daytime sleepiness  Assessment and Plan Assessment & Plan Suspected sleep apnea Severity unknown, concerns about nocturnal dyspnea contributing to hypertension. Untreated sleep apnea can lead to cardiovascular complications. - Ordered home sleep study. - Educated on sleep hygiene practices.  Hypertension Managed with carvedilol  and Benicar . Blood pressure fluctuations possibly related to sleep apnea. White coat syndrome noted. - Monitor blood pressure regularly.  Headache Intermittent headaches possibly related to sinus issues or uncontrolled hypertension. - Continue to manage blood pressure to potentially alleviate headaches.    Return in about 7 weeks (around 02/17/2024) for sleep study review.  Candis Dandy, PA-C 12/30/2023

## 2023-12-31 ENCOUNTER — Ambulatory Visit

## 2023-12-31 VITALS — BP 126/67 | HR 68 | Wt 172.0 lb

## 2023-12-31 DIAGNOSIS — E669 Obesity, unspecified: Secondary | ICD-10-CM | POA: Diagnosis not present

## 2023-12-31 DIAGNOSIS — I1A0 Resistant hypertension: Secondary | ICD-10-CM

## 2023-12-31 DIAGNOSIS — E119 Type 2 diabetes mellitus without complications: Secondary | ICD-10-CM | POA: Diagnosis not present

## 2023-12-31 MED ORDER — OZEMPIC (0.25 OR 0.5 MG/DOSE) 2 MG/3ML ~~LOC~~ SOPN: 0.5000 mg | PEN_INJECTOR | SUBCUTANEOUS | 6 refills | Status: AC

## 2023-12-31 NOTE — Patient Instructions (Addendum)
 It was wonderful to see you today.  Please bring ALL of your medications with you to every visit.   Today we talked about:  Your high blood pressure. Your home pressures look great! Lets continue on your current medications. Get your sleep study done. Get your lab work done today.   Call and tell PT I have sent a referral for you for R hip and sciatica pain back in Oct. Let me know if they need anything else.  [facility] [address] floor [phone] They will call the patient to schedule an appointment. Margit Dimes, CMA  I have increased your Ozempic  dose. Keep walking and eating a heart healthy low salt diet. Use good sleep hygiene and get 7-8 hours a night. Keep up the good work!  I'll see you back in 3 months for weight and blood pressure check!  Thank you for choosing [facility].   Please call [phone] with any questions about today's appointment.  Please arrive at least 15 minutes prior to your scheduled appointments.   If you had blood work today, I will send you a MyChart message or a letter if results are normal. Otherwise, I will give you a call.   If you had a referral placed, they will call you to set up an appointment. Please give us  a call if you don't hear back in the next 2 weeks.   If you need additional refills before your next appointment, please call your pharmacy first.   You should follow up in our clinic in 3 months.  Camie Dixons, DO Family Medicine

## 2023-12-31 NOTE — Progress Notes (Signed)
    SUBJECTIVE:   CHIEF COMPLAINT / HPI:   [name] is a 63yo F who presents to the clinic for follow up of BP management. Since last seen by me, she has had an ED visit and a follow up visit with Dr. Romelle for hypertension.   Dr. Romelle had increased her Benicar  HCT to 40-25 at that visit. She has brought in her BP log today and notes Bps in the 110-130s/60-70s. She states she is feeling better overall. Office BP has greatly improved as well.   She was seen for evaluation for a sleep study to see if OSA is contributory towards her resistant HTN and will have a home sleep study conducted in the next few weeks.   PERTINENT  PMH / PSH: Resistant hypertension  OBJECTIVE:   Wt 172 lb (78 kg)   BMI 31.46 kg/m   General: A&O, NAD Cardiac: RRR, no m/r/g Respiratory: normal WOB GI: non-distended  Extremities: no peripheral edema. Neuro: Normal gait, moves all four extremities appropriately. Psych: Appropriate mood and affect   ASSESSMENT/PLAN:   Assessment & Plan Resistant hypertension Blood pressures markedly improved since increasing Benicar . Continue at this time.  - Get your home sleep study done  - Extensive discussion on sleep hygiene - BMP today - See me back in 3 months  Obesity (BMI 30-39.9) Maintaining between 170-175lbs since placed on Ozempic  0.25mg  weekly in 06/25. Patient is very motivated to lose weight for her health and especially to help her HTN.  - Increase to 0.5mg  today, rx sent to pharmacy - Given handouts on DASH and Mediterranean diets - Continue walking for exercise   Camie Dixons, DO [facility]

## 2024-01-01 ENCOUNTER — Ambulatory Visit: Payer: Self-pay

## 2024-01-01 LAB — BASIC METABOLIC PANEL WITH GFR
BUN/Creatinine Ratio: 16 (ref 12–28)
BUN: 14 mg/dL (ref 8–27)
CO2: 24 mmol/L (ref 20–29)
Calcium: 9.8 mg/dL (ref 8.7–10.3)
Chloride: 99 mmol/L (ref 96–106)
Creatinine, Ser: 0.89 mg/dL (ref 0.57–1.00)
Glucose: 80 mg/dL (ref 70–99)
Potassium: 3.6 mmol/L (ref 3.5–5.2)
Sodium: 139 mmol/L (ref 134–144)
eGFR: 73 mL/min/1.73 (ref >59)

## 2024-01-14 ENCOUNTER — Ambulatory Visit (HOSPITAL_BASED_OUTPATIENT_CLINIC_OR_DEPARTMENT_OTHER)

## 2024-01-14 ENCOUNTER — Ambulatory Visit (INDEPENDENT_AMBULATORY_CARE_PROVIDER_SITE_OTHER): Admitting: Family Medicine

## 2024-01-14 ENCOUNTER — Encounter: Payer: Self-pay | Admitting: Family Medicine

## 2024-01-14 VITALS — BP 139/72 | HR 85 | Ht 62.0 in | Wt 173.6 lb

## 2024-01-14 DIAGNOSIS — G44209 Tension-type headache, unspecified, not intractable: Secondary | ICD-10-CM

## 2024-01-14 DIAGNOSIS — I10 Essential (primary) hypertension: Secondary | ICD-10-CM | POA: Diagnosis not present

## 2024-01-14 DIAGNOSIS — R519 Headache, unspecified: Secondary | ICD-10-CM | POA: Insufficient documentation

## 2024-01-14 DIAGNOSIS — E119 Type 2 diabetes mellitus without complications: Secondary | ICD-10-CM

## 2024-01-14 DIAGNOSIS — H938X2 Other specified disorders of left ear: Secondary | ICD-10-CM | POA: Diagnosis not present

## 2024-01-14 LAB — POCT GLYCOSYLATED HEMOGLOBIN (HGB A1C): HbA1c, POC (controlled diabetic range): 6.3 % (ref 0.0–7.0)

## 2024-01-14 NOTE — Assessment & Plan Note (Addendum)
 Tension-like with prior h/o of same. Likely precipitated by recent decrease in oral intake, seems intentional as mentions certain foods contributing to higher Bps. Unsure if recent increase in ozempic  causing lack of appetite though doesn't appear so. Neuro exam normal and otherwise no red flags on history or exam to concern for stroke, mass, bleed. Known L ear canal mass present, no changes from prior exams, follows with ENT. BP initially elevated, improved on recheck, may be contributing. Recommend increasing hydration, continue to check BP at home, f/u 1-2 weeks if persistent.

## 2024-01-14 NOTE — Progress Notes (Signed)
° °  SUBJECTIVE:   CHIEF COMPLAINT / HPI:   Discussed the use of AI scribe software for clinical note transcription with the patient, who gave verbal consent to proceed.  History of Present Illness [name] is a 63 year old female with hypertension who presents with headaches and elevated blood pressure.  Headache, HTN - Headache for the past three days, primarily on the right side of the head with radiation to the neck and shoulder - Dull in quality - Not associated with photophobia, phonophobia, nausea, or vomiting - Has h/o occasional tension headaches, character is same. - Tylenol  has not provided relief - Avoids ibuprofen due to low platelets - Runny nose for the past two weeks, worse in the mornings - Occasional ear pain - No significant nasal congestion - Intermittent blurry vision in both eyes, mainly after taking medication - Recent eye exam was normal - Recent home blood pressures mostly in 130s systolic and 50s to 80s diastolic, one measurement 150s/50s SBP - Concerned about elevated blood pressure and is limiting food intake as a result, thinks certain foods contribute to higher pressures - has noticed darker yellow urine color. Thinks she hasn't been drinking as much lately. - is planning for home sleep study tonight    OBJECTIVE:   BP 139/72   Pulse 85   Ht 5' 2 (1.575 m)   Wt 173 lb 9.6 oz (78.7 kg)   SpO2 100%   BMI 31.75 kg/m   Gen: well appearing, in NAD HEENT:  R TM visible without bulging, erythema, purulence. L TM not visible due to known ear canal mass.  Card: RRR Lungs: CTAB Ext: WWP, no edema MSK: Tone and bulk normal. Full ROM, strength 5/5 to U/LE bilaterally, normal gait.  No edema.  Neuro: Alert and oriented, speech normal.  Extraocular movements intact.  Intact symmetric sensation to light touch of face and extremities bilaterally.  Hearing grossly intact bilaterally.  Tongue protrudes normally with no deviation.  Shoulder shrug, smile  symmetric. Finger to nose normal.   ASSESSMENT/PLAN:   Headache Tension-like with prior h/o of same. Likely precipitated by recent decrease in oral intake, seems intentional as mentions certain foods contributing to higher Bps. Unsure if recent increase in ozempic  causing lack of appetite though doesn't appear so. Neuro exam normal and otherwise no red flags on history or exam to concern for stroke, mass, bleed. Known L ear canal mass present, no changes from prior exams, follows with ENT. BP initially elevated, improved on recheck, may be contributing. Recommend increasing hydration, continue to check BP at home, f/u 1-2 weeks if persistent.   Essential hypertension Initially elevated though with wide pulse pressure, improved on recheck. Unclear if contributing to headaches. No changes today. Continue to monitor at home and f/u 1-2 weeks if persistent.     Donald CHRISTELLA Lai, DO

## 2024-01-14 NOTE — Patient Instructions (Signed)
 It was great to see you!  Our plans for today:  - Work on increasing your hydration. Aim for light yellow to clear urine.  - You can continue to take tylenol  as needed for pain.  - Monitor your blood pressure at home and keep a log of your readings. Make sure to be seated for at least 5 minutes prior to testing and not in pain or worked up for the most accurate readings. Bring this log with you to follow up.  - Come back in 1-2 weeks for follow up.   Take care and seek immediate care sooner if you develop any concerns.   Dr. Shanise Balch

## 2024-01-14 NOTE — Assessment & Plan Note (Signed)
 Initially elevated though with wide pulse pressure, improved on recheck. Unclear if contributing to headaches. No changes today. Continue to monitor at home and f/u 1-2 weeks if persistent.

## 2024-01-15 ENCOUNTER — Encounter

## 2024-01-15 DIAGNOSIS — G4719 Other hypersomnia: Secondary | ICD-10-CM

## 2024-02-01 DIAGNOSIS — G4733 Obstructive sleep apnea (adult) (pediatric): Secondary | ICD-10-CM | POA: Diagnosis not present

## 2024-02-01 DIAGNOSIS — G4730 Sleep apnea, unspecified: Secondary | ICD-10-CM | POA: Diagnosis not present

## 2024-02-02 ENCOUNTER — Ambulatory Visit

## 2024-02-12 ENCOUNTER — Other Ambulatory Visit: Payer: Self-pay

## 2024-02-12 DIAGNOSIS — I10 Essential (primary) hypertension: Secondary | ICD-10-CM

## 2024-02-24 ENCOUNTER — Ambulatory Visit (INDEPENDENT_AMBULATORY_CARE_PROVIDER_SITE_OTHER)

## 2024-02-24 ENCOUNTER — Encounter (HOSPITAL_BASED_OUTPATIENT_CLINIC_OR_DEPARTMENT_OTHER): Payer: Self-pay

## 2024-02-24 VITALS — BP 147/82 | HR 87 | Ht 62.0 in | Wt 166.0 lb

## 2024-02-24 DIAGNOSIS — G4710 Hypersomnia, unspecified: Secondary | ICD-10-CM

## 2024-02-24 DIAGNOSIS — G4719 Other hypersomnia: Secondary | ICD-10-CM

## 2024-02-24 NOTE — Progress Notes (Signed)
 "  @Patient  ID: [name], female    DOB: 30-Jul-1960, 64 y.o.   MRN: [id number]  Chief Complaint  Patient presents with   Follow-up    Sleep     Referring provider: Lupie Credit, DO  HPI: Discussed the use of AI scribe software for clinical note transcription with the patient, who gave verbal consent to proceed.  History of Present Illness HAZELGRACE Lawson is a 64 year old female who presents for follow-up after a home sleep test.  She underwent a split night sleep study in 2017, but treatment was not initiated at that time as she did not meet the split night criteria.  Recently, she underwent a home sleep test to reassess her condition. The test showed 1.6 events per hour over 294 minutes of sleep. Her oxygen levels remained above 88%.  She feels good overall with no new or different symptoms. No snoring is reported by her husband, and she is not waking him up at night.  Last OV 12/30/2023: [name] is a 64 year old female with PMH of sleep apnea diagnosed in 2017 who presents with sleep disturbances and concerns about blood pressure. She was referred by her PCP Dr. Lupie for evaluation of sleep disturbances potentially related to blood pressure issues.   She experiences difficulty falling asleep, typically going to bed around 11 PM, and wakes up multiple times during the night, primarily to use the bathroom. She attributes this to her blood pressure medication, Benicar , which contains hydrochlorothiazide . She occasionally wakes up startled and short of breath. She usually wakes up around 5 AM and finds it difficult to return to sleep.   A split night sleep study was completed in 2017, but she did not start CPAP after the study. She has not been referred for surgery or a mouthpiece, although a dentist suggested a mouthguard for her teeth, which she has not used consistently. She is unsure if she snores; she does not live with anyone.   She has experienced weight  fluctuations over the past few years, including weight gain. She is treated for high blood pressure, high cholesterol, diabetes, and has a history of breast cancer and low platelets. She is currently taking carvedilol , Benicar , and metformin , which she reports makes her drowsy when taken at night. She was prescribed Ozempic  for weight loss and diabetes management but has not taken it recently due to concerns about its effect on her blood pressure.   No family history of sleep apnea, although her sister is undergoing testing for it. She experiences headaches, which she attributes to sinus issues, but acknowledges they could be related to her blood pressure. She monitors her blood pressure regularly and notes it is often elevated during medical visits, attributing this to 'white coat syndrome'. No chest pain, irregular heartbeats, or shortness of breath during activities.   She experiences some swelling in her feet and ankles, which worsens by the end of the day but improves with elevation.     TEST/EVENTS :    01/15/2024:  HST:  AHI 1.6/hr, O2 sats remained 88% or greater during the entirety of the study.  294 minutes of sleep   09/19/2015 Split night study:  did not meet split night criteria and transitioned to NPSG with RDI of 12.9  Allergies[1]  Immunization History  Administered Date(s) Administered   Influenza Split 11/09/2010, 11/12/2011   Influenza, Seasonal, Injecte, Preservative Fre 10/24/2022, 11/27/2023   Influenza,inj,Quad PF,6+ Mos 11/13/2012, 11/17/2015, 11/28/2016, 12/02/2017, 11/13/2018, 11/30/2019, 12/22/2020,  11/06/2021   Influenza-Unspecified 11/04/2013, 11/13/2014   PFIZER Comirnaty(Gray Top)Covid-19 Tri-Sucrose Vaccine 12/04/2021   PFIZER(Purple Top)SARS-COV-2 Vaccination 04/20/2019, 05/11/2019, 12/15/2019   PNEUMOCOCCAL CONJUGATE-20 09/19/2020   PPD Test 01/16/2011, 06/27/2015   Pfizer(Comirnaty)Fall Seasonal Vaccine 12 years and older 12/04/2021   Pneumococcal  Polysaccharide-23 12/06/1998, 11/13/2012   Td 02/04/1994   Tdap 11/13/2012    Past Medical History:  Diagnosis Date   Arthritis    Breast CA (HCC)    breast CA- right breast   Diabetes mellitus    Hyperlipidemia    Hypertension    Thrombocytopenia    Thyroid  disease    parathyroid  removed    Tobacco History: Tobacco Use History[2] Counseling given: Not Answered   Outpatient Medications Prior to Visit  Medication Sig Dispense Refill   carvedilol  (COREG ) 25 MG tablet TAKE 1 TABLET BY MOUTH TWICE DAILY WITH A MEAL 60 tablet 3   cyanocobalamin  (VITAMIN B12) 500 MCG tablet Take 1 tablet (500 mcg total) by mouth daily. 30 tablet 0   ezetimibe  (ZETIA ) 10 MG tablet Take 1 tablet (10 mg total) by mouth daily. 90 tablet 3   glucose blood test strip Use as instructed - daily testing 350 each 12   Lancet Devices (ONE TOUCH DELICA LANCING DEV) MISC Please use to check blood sugar once per day. E11.9 1 each 0   Lancets (ONETOUCH DELICA PLUS LANCET33G) MISC USE  1 TO CHECK GLUCOSE ONCE DAILY 100 each 0   metFORMIN  (GLUCOPHAGE -XR) 500 MG 24 hr tablet Take 2 tablets (1,000 mg total) by mouth 2 (two) times daily with a meal. 120 tablet 5   olmesartan -hydrochlorothiazide  (BENICAR  HCT) 40-25 MG tablet Take 1 tablet by mouth daily. 30 tablet 3   pravastatin  (PRAVACHOL ) 80 MG tablet Take 1 tablet (80 mg total) by mouth daily. 90 tablet 3   Semaglutide ,0.25 or 0.5MG /DOS, (OZEMPIC , 0.25 OR 0.5 MG/DOSE,) 2 MG/3ML SOPN Inject 0.5 mg into the skin once a week. 3 mL 6   No facility-administered medications prior to visit.     Review of Systems: as per hpi  Constitutional:   No  weight loss, night sweats,  Fevers, chills, fatigue, or  lassitude.  HEENT:   No headaches,  Difficulty swallowing,  Tooth/dental problems, or  Sore throat,                No sneezing, itching, ear ache, nasal congestion, post nasal drip,   CV:  No chest pain,  Orthopnea, PND, swelling in lower extremities, anasarca,  dizziness, palpitations, syncope.   GI  No heartburn, indigestion, abdominal pain, nausea, vomiting, diarrhea, change in bowel habits, loss of appetite, bloody stools.   Resp: No shortness of breath with exertion or at rest.  No excess mucus, no productive cough,  No non-productive cough,  No coughing up of blood.  No change in color of mucus.  No wheezing.  No chest wall deformity  Skin: no rash or lesions.  GU: no dysuria, change in color of urine, no urgency or frequency.  No flank pain, no hematuria   MS:  No joint pain or swelling.  No decreased range of motion.  No back pain.    Physical Exam  BP (!) 147/82   Pulse 87   Ht 5' 2 (1.575 m)   Wt 166 lb (75.3 kg)   SpO2 94%   BMI 30.36 kg/m   GEN: A/Ox3; pleasant , NAD, well nourished    HEENT:  [place]/AT,  EACs-clear, TMs-wnl, NOSE-clear, THROAT-clear, no lesions, no  postnasal drip or exudate noted.   NECK:  Supple w/ fair ROM; no JVD; normal carotid impulses w/o bruits; no thyromegaly or nodules palpated; no lymphadenopathy.    RESP  Clear  P & A; w/o, wheezes/ rales/ or rhonchi. no accessory muscle use, no dullness to percussion  CARD:  RRR, no m/r/g, no peripheral edema, pulses intact, no cyanosis or clubbing.  GI:   Soft & nt; nml bowel sounds; no organomegaly or masses detected.   Musco: Warm bil, no deformities or joint swelling noted.   Neuro: alert, no focal deficits noted.    Skin: Warm, no lesions or rashes    Lab Results:  CBC    Component Value Date/Time   WBC 7.8 11/30/2023 1054   RBC 5.00 11/30/2023 1054   HGB 12.4 11/30/2023 1054   HGB 13.6 02/27/2023 1030   HGB 12.6 11/22/2016 0900   HCT 39.4 11/30/2023 1054   HCT 41.3 02/27/2023 1030   HCT 40.0 11/22/2016 0900   PLT 106 (L) 11/30/2023 1054   PLT 101 (L) 02/27/2023 1030   MCV 78.8 (L) 11/30/2023 1054   MCV 76 (L) 02/27/2023 1030   MCV 79.1 (L) 11/22/2016 0900   MCH 24.8 (L) 11/30/2023 1054   MCHC 31.5 11/30/2023 1054   RDW 14.4  11/30/2023 1054   RDW 13.7 02/27/2023 1030   RDW 15.0 (H) 11/22/2016 0900   LYMPHSABS 3.4 03/02/2023 1553   LYMPHSABS 3.5 (H) 05/13/2022 1715   LYMPHSABS 3.2 11/22/2016 0900   MONOABS 0.4 03/02/2023 1553   MONOABS 0.5 11/22/2016 0900   EOSABS 0.2 03/02/2023 1553   EOSABS 0.1 05/13/2022 1715   BASOSABS 0.0 03/02/2023 1553   BASOSABS 0.1 05/13/2022 1715   BASOSABS 0.0 11/22/2016 0900    BMET    Component Value Date/Time   NA 139 12/31/2023 1431   NA 140 11/22/2016 0900   K 3.6 12/31/2023 1431   K 4.5 11/22/2016 0900   CL 99 12/31/2023 1431   CL 105 02/21/2012 0806   CO2 24 12/31/2023 1431   CO2 28 11/22/2016 0900   GLUCOSE 80 12/31/2023 1431   GLUCOSE 123 (H) 11/30/2023 1054   GLUCOSE 109 11/22/2016 0900   GLUCOSE 135 (H) 02/21/2012 0806   BUN 14 12/31/2023 1431   BUN 13.6 11/22/2016 0900   CREATININE 0.89 12/31/2023 1431   CREATININE 0.97 11/24/2017 0805   CREATININE 0.9 11/22/2016 0900   CALCIUM  9.8 12/31/2023 1431   CALCIUM  9.9 11/22/2016 0900   GFRNONAA >60 11/30/2023 1054   GFRNONAA >60 11/24/2017 0805   GFRNONAA 67 08/15/2015 1342   GFRAA 87 01/14/2020 1003   GFRAA >60 11/24/2017 0805   GFRAA 77 08/15/2015 1342    BNP    Component Value Date/Time   BNP 7.4 12/05/2017 1352   BNP 12.6 04/04/2011 0910    ProBNP No results found for: PROBNP  Imaging: No results found.  Administration History     None           No data to display          No results found for: NITRICOXIDE   Assessment & Plan:   Assessment & Plan Excessive daytime sleepiness  Assessment and Plan Assessment & Plan Evaluation of potential sleep apnea Home sleep test showed 1.6 events per hour. Oxygen levels adequate. -  Offered further evaluation with in-lab study; patient declined further workup. -  Discussed sleep hygiene.  - Return for evaluation if symptoms like fatigue or snoring develop. - Consider in-lab  sleep study if persistent or new  symptoms.    Return if symptoms worsen or fail to improve, for sleep.  Candis Dandy, PA-C 02/24/2024      [1] No Known Allergies [2]  Social History Tobacco Use  Smoking Status Never   Passive exposure: Past  Smokeless Tobacco Never   "

## 2024-02-24 NOTE — Patient Instructions (Signed)
 Follow up as needed for continued sleep concerns- would proceed with in-lab study if continued concerns.   Follow sleep hygiene as discussed.

## 2024-03-25 ENCOUNTER — Ambulatory Visit: Payer: Self-pay

## 2024-06-17 ENCOUNTER — Encounter
